# Patient Record
Sex: Male | Born: 1945
Health system: Southern US, Community
[De-identification: ages and names within clinical notes are randomized; demographics above are authoritative.]

## PROBLEM LIST (undated history)

## (undated) DIAGNOSIS — J449 Chronic obstructive pulmonary disease, unspecified: Secondary | ICD-10-CM

## (undated) DIAGNOSIS — I4891 Unspecified atrial fibrillation: Secondary | ICD-10-CM

## (undated) DIAGNOSIS — K922 Gastrointestinal hemorrhage, unspecified: Secondary | ICD-10-CM

## (undated) DIAGNOSIS — I700 Atherosclerosis of aorta: Secondary | ICD-10-CM

## (undated) DIAGNOSIS — Z720 Tobacco use: Secondary | ICD-10-CM

## (undated) DIAGNOSIS — I493 Ventricular premature depolarization: Secondary | ICD-10-CM

## (undated) DIAGNOSIS — J441 Chronic obstructive pulmonary disease with (acute) exacerbation: Secondary | ICD-10-CM

## (undated) DIAGNOSIS — K760 Fatty (change of) liver, not elsewhere classified: Secondary | ICD-10-CM

## (undated) DIAGNOSIS — I4729 Other ventricular tachycardia: Secondary | ICD-10-CM

## (undated) DIAGNOSIS — I5189 Other ill-defined heart diseases: Secondary | ICD-10-CM

## (undated) DIAGNOSIS — R001 Bradycardia, unspecified: Secondary | ICD-10-CM

## (undated) DIAGNOSIS — I452 Bifascicular block: Secondary | ICD-10-CM

## (undated) DIAGNOSIS — K5520 Angiodysplasia of colon without hemorrhage: Secondary | ICD-10-CM

## (undated) DIAGNOSIS — E669 Obesity, unspecified: Secondary | ICD-10-CM

## (undated) DIAGNOSIS — I10 Essential (primary) hypertension: Secondary | ICD-10-CM

## (undated) DIAGNOSIS — I447 Left bundle-branch block, unspecified: Secondary | ICD-10-CM

## (undated) DIAGNOSIS — I472 Ventricular tachycardia: Secondary | ICD-10-CM

## (undated) DIAGNOSIS — R9431 Abnormal electrocardiogram [ECG] [EKG]: Secondary | ICD-10-CM

## (undated) DIAGNOSIS — G4733 Obstructive sleep apnea (adult) (pediatric): Secondary | ICD-10-CM

## (undated) DIAGNOSIS — I779 Disorder of arteries and arterioles, unspecified: Secondary | ICD-10-CM

## (undated) DIAGNOSIS — R55 Syncope and collapse: Secondary | ICD-10-CM

## (undated) DIAGNOSIS — I350 Nonrheumatic aortic (valve) stenosis: Secondary | ICD-10-CM

## (undated) HISTORY — DX: Left bundle-branch block, unspecified: I44.7

## (undated) HISTORY — DX: Syncope and collapse: R55

## (undated) HISTORY — DX: Ventricular tachycardia: I47.2

## (undated) HISTORY — DX: Essential (primary) hypertension: I10

## (undated) HISTORY — DX: Abnormal electrocardiogram (ECG) (EKG): R94.31

---

## 2000-06-11 ENCOUNTER — Emergency Department (HOSPITAL_COMMUNITY): Admission: EM | Admit: 2000-06-11 | Discharge: 2000-06-11 | Payer: Self-pay

## 2005-12-18 ENCOUNTER — Ambulatory Visit (HOSPITAL_COMMUNITY): Admission: RE | Admit: 2005-12-18 | Discharge: 2005-12-18 | Payer: Self-pay | Admitting: *Deleted

## 2014-07-21 DIAGNOSIS — B9789 Other viral agents as the cause of diseases classified elsewhere: Secondary | ICD-10-CM | POA: Diagnosis not present

## 2014-07-21 DIAGNOSIS — J069 Acute upper respiratory infection, unspecified: Secondary | ICD-10-CM | POA: Diagnosis not present

## 2014-07-21 DIAGNOSIS — I10 Essential (primary) hypertension: Secondary | ICD-10-CM | POA: Diagnosis not present

## 2015-06-15 ENCOUNTER — Other Ambulatory Visit: Payer: Self-pay | Admitting: Internal Medicine

## 2015-06-15 DIAGNOSIS — J449 Chronic obstructive pulmonary disease, unspecified: Secondary | ICD-10-CM | POA: Diagnosis not present

## 2015-06-15 DIAGNOSIS — R9431 Abnormal electrocardiogram [ECG] [EKG]: Secondary | ICD-10-CM | POA: Diagnosis not present

## 2015-06-15 DIAGNOSIS — R55 Syncope and collapse: Secondary | ICD-10-CM

## 2015-06-15 DIAGNOSIS — J440 Chronic obstructive pulmonary disease with acute lower respiratory infection: Secondary | ICD-10-CM | POA: Diagnosis not present

## 2015-06-15 DIAGNOSIS — I951 Orthostatic hypotension: Secondary | ICD-10-CM | POA: Diagnosis not present

## 2015-06-16 ENCOUNTER — Telehealth: Payer: Self-pay | Admitting: Internal Medicine

## 2015-06-16 ENCOUNTER — Other Ambulatory Visit (HOSPITAL_COMMUNITY): Payer: Self-pay | Admitting: Internal Medicine

## 2015-06-16 ENCOUNTER — Ambulatory Visit (HOSPITAL_COMMUNITY)
Admission: RE | Admit: 2015-06-16 | Discharge: 2015-06-16 | Disposition: A | Discharge status: Home / Self Care | Payer: Medicare Other | Source: Ambulatory Visit | Attending: Obstetrics & Gynecology | Admitting: Obstetrics & Gynecology

## 2015-06-16 DIAGNOSIS — R55 Syncope and collapse: Secondary | ICD-10-CM

## 2015-06-16 DIAGNOSIS — I6523 Occlusion and stenosis of bilateral carotid arteries: Secondary | ICD-10-CM | POA: Diagnosis not present

## 2015-06-16 DIAGNOSIS — E782 Mixed hyperlipidemia: Secondary | ICD-10-CM | POA: Diagnosis not present

## 2015-06-16 NOTE — Progress Notes (Signed)
VASCULAR LAB PRELIMINARY  PRELIMINARY  PRELIMINARY  PRELIMINARY  Carotid duplex completed.    Preliminary report:  Right : 1% to 39% ICA stenosis. Vertebral artery flow is "to - fro " consistent with a more proximal stenosis. Left :  There is an area of elevated velocities in the mid ICA consistent with a 60% to 79% ICA stenosis lower end of scale. The artery is also tortuous at this sight. The remainder of the artery proximal and distal are consistent with a 1% to 39% ICA stenosis. Vertebral artery flow is anterade.  Farid Grigorian, RVS 06/16/2015, 2:23 PM

## 2015-06-16 NOTE — Telephone Encounter (Signed)
Received records from [facility] for appointment with Dr Debara Pickett on 06/18/15.  Records given to [facility] (medical records) for Dr Lysbeth Penner schedule on 06/18/15. lp

## 2015-06-18 ENCOUNTER — Encounter: Payer: Self-pay | Admitting: Internal Medicine

## 2015-06-18 ENCOUNTER — Ambulatory Visit (INDEPENDENT_AMBULATORY_CARE_PROVIDER_SITE_OTHER): Payer: Medicare Other | Admitting: Internal Medicine

## 2015-06-18 VITALS — BP 166/88 | HR 68 | Ht 66.0 in | Wt 211.3 lb

## 2015-06-18 DIAGNOSIS — I447 Left bundle-branch block, unspecified: Secondary | ICD-10-CM | POA: Diagnosis not present

## 2015-06-18 DIAGNOSIS — R55 Syncope and collapse: Secondary | ICD-10-CM | POA: Diagnosis not present

## 2015-06-18 DIAGNOSIS — R9431 Abnormal electrocardiogram [ECG] [EKG]: Secondary | ICD-10-CM | POA: Diagnosis not present

## 2015-06-18 HISTORY — DX: Abnormal electrocardiogram (ECG) (EKG): R94.31

## 2015-06-18 HISTORY — DX: Left bundle-branch block, unspecified: I44.7

## 2015-06-18 HISTORY — DX: Syncope and collapse: R55

## 2015-06-18 NOTE — Progress Notes (Signed)
OFFICE NOTE  Chief Complaint:  Syncope, abnormal EKG  Primary Care Physician: Horatio Pel, MD  HPI:  Derrick Alexander is a pleasant 70-year-old male with no significant past medical history. He only takes aspirin occasionally as needed. Unfortunately he's a 60 year smoker that currently smoking. Recently he's had 3 different episodes of what seemed to be brief syncope. All of the episodes were at rest and he was seated at the time. His wife said that he was talking with her and then briefly seem to lose consciousness but then was easily awakened and was not confused. He did not slump over lose control of bowel or bladder function. There was no seizure-like activity. He reported some mild prodrome to the symptoms. In fact he felt some tingling and numbness around his scalp that felt like he was wearing a tight baseball cap as well as some graying of his vision. He underwent carotid Dopplers 2 days ago and those results are pending. Of note his blood pressure was mildly elevated today but he has no history of hypertension in fact he was thought to be possibly hypotensive or orthostatic. He denies any chest pain or worsening shortness of breath. His EKG however shows left bundle branch pattern as well as ST and T wave abnormalities laterally with 1 mm of horizontal ST depression at rest concerning for ischemia.  PMHx:  No significant past medical history No past medical history on file.  No past surgical history on file.  FAMHx:  Family History  Problem Relation Age of Onset  . Heart disease Brother     SOCHx:   reports that he has been smoking.  He started smoking about 60 years ago. He has never used smokeless tobacco. His alcohol and drug histories are not on file.  ALLERGIES:  No Known Allergies  ROS: A comprehensive review of systems was negative except for: Cardiovascular: positive for syncope  HOME MEDS: Current Outpatient Prescriptions  Medication Sig Dispense Refill    . aspirin 325 MG tablet Take 325 mg by mouth as needed.     No current facility-administered medications for this visit.    LABS/IMAGING: No results found for this or any previous visit (from the past 48 hour(s)). No results found.  WEIGHTS: Wt Readings from Last 3 Encounters:  06/18/15 211 lb 5 oz (95.851 kg)    VITALS: BP 166/88 mmHg  Pulse 68  Ht 5\' 6"  (1.676 m)  Wt 211 lb 5 oz (95.851 kg)  BMI 34.12 kg/m2  EXAM: General appearance: alert and no distress Neck: no carotid bruit and no JVD Lungs: clear to auscultation bilaterally Heart: regular rate and rhythm, S1, S2 normal, no murmur, click, rub or gallop Abdomen: soft, non-tender; bowel sounds normal; no masses,  no organomegaly Extremities: extremities normal, atraumatic, no cyanosis or edema Pulses: 2+ and symmetric Skin: Skin color, texture, turgor normal. No rashes or lesions Neurologic: Grossly normal Psych: Pleasant  EKG: Normal sinus rhythm at 68, left bundle branch pattern, ST and T-wave abnormalities with 1 mm ST depression horizontally in the lateral leads  ASSESSMENT: 1. Syncope 2. Abnormal EKG with left bundle branch / LAFB pattern 3. Tobacco abuse  PLAN: 1.   [name] is had several episodes of brief loss of consciousness which was proceeded very briefly by visual changes and some tingling in his scalp. There is no post ictal or seizure activity. He awakened very quickly with stimulation. It does not sound that he was necessarily falling asleep and did  not lose control of his body. The episodes were very brief and short-lived but are new. He denies any other associated symptoms such as chest pain or shortness of breath however his EKG is abnormal showing some ischemic changes. The EKG would be very difficult to interpret due to the left bundle pattern and therefore recommend a Lexiscan nuclear stress test to evaluate for ischemia. Arrhythmia is another possibility and I recommend that we place a 30 day  monitor to see if we can pick up any abnormal heart rhythms it could be causing these episodes. These do not sound necessarily like seizure activity and with the short duration and quick waking. It seems that seizure would be unlikely. Of course there is mild prodrome and one would think about orthostasis however blood pressures have seemed to be fairly normal or high. I think given his long smoking history, age and history of heart problems in his brother, ischemia evaluation is warranted.  Plan to see him back to discuss those findings after the tests are completed. Again thanks for the kind referral as always.  Pixie Casino, MD, [facility] Attending Cardiologist Joy C Hilty 06/18/2015, 6:10 PM

## 2015-06-18 NOTE — Patient Instructions (Signed)
Your physician has recommended that you wear an event monitor. Event monitors are medical devices that record the heart's electrical activity. Doctors most often [place] these monitors to diagnose arrhythmias. Arrhythmias are problems with the speed or rhythm of the heartbeat. The monitor is a small, portable device. You can wear one while you do your normal daily activities. This is usually used to diagnose what is causing palpitations/syncope (passing out).  Your physician has requested that you have a lexiscan myoview. For further information please visit [email or web address]. Please follow instruction sheet, as given.  Dr Debara Pickett recommends that you schedule a follow-up appointment in 6 weeks.  Your Doctor has ordered you to wear a heart monitor. You will wear this for 30 days.   TIPS -  REMINDERS 1. The sensor is the lanyard that is worn around your neck every day - this is powered by a battery that needs to be changed every day 2. The monitor is the device that allows you to record symptoms - this will need to be charged daily 3. The sensor & monitor need to be within 100 feet of each other at all times 4. The sensor connects to the electrodes (stickers) - these should be changed every 24-48 hours (you do not have to remove them when you bathe, just make sure they are dry when you connect it back to the sensor 5. If you need more supplies (electrodes, batteries), please call the 1-800 # on the back of the pamphlet and [employer] will mail you more supplies 6. If your skin becomes sensitive, please try the sample pack of sensitive skin electrodes (the white packet in your silver box) and call [employer] to have them mail you more of these type of electrodes 7. When you are finish wearing the monitor, please place all supplies back in the silver box, place the silver box in the pre-packaged UPS bag and drop off at [employer] or call them so they can come pick it up   Cardiac Event Monitoring A cardiac event  monitor is a small recording device used to help detect abnormal heart rhythms (arrhythmias). The monitor is used to record heart rhythm when noticeable symptoms such as the following occur:  Fast heartbeats (palpitations), such as heart racing or fluttering.  Dizziness.  Fainting or light-headedness.  Unexplained weakness. The monitor is wired to two electrodes placed on your chest. Electrodes are flat, sticky disks that attach to your skin. The monitor can be worn for up to 30 days. You will wear the monitor at all times, except when bathing.  HOW TO USE YOUR CARDIAC EVENT MONITOR A technician will prepare your chest for the electrode placement. The technician will show you how to place the electrodes, how to work the monitor, and how to replace the batteries. Take time to practice using the monitor before you leave the office. Make sure you understand how to send the information from the monitor to your health care provider. This requires a telephone with a landline, not a cell phone. You need to:  Wear your monitor at all times, except when you are in water:  Do not get the monitor wet.  Take the monitor off when bathing. Do not swim or use a hot tub with it on.  Keep your skin clean. Do not put body lotion or moisturizer on your chest.  Change the electrodes daily or any time they stop sticking to your skin. You might need to use tape to keep them on.  It  is possible that your skin under the electrodes could become irritated. To keep this from happening, try to put the electrodes in slightly different places on your chest. However, they must remain in the area under your left breast and in the upper right section of your chest.  Make sure the monitor is safely clipped to your clothing or in a location close to your body that your health care provider recommends.  Press the button to record when you feel symptoms of heart trouble, such as dizziness, weakness, light-headedness,  palpitations, thumping, shortness of breath, unexplained weakness, or a fluttering or racing heart. The monitor is always on and records what happened slightly before you pressed the button, so do not worry about being too late to get good information.  Keep a diary of your activities, such as walking, doing chores, and taking medicine. It is especially important to note what you were doing when you pushed the button to record your symptoms. This will help your health care provider determine what might be contributing to your symptoms. The information stored in your monitor will be reviewed by your health care provider alongside your diary entries.  Send the recorded information as recommended by your health care provider. It is important to understand that it will take some time for your health care provider to process the results.  Change the batteries as recommended by your health care provider. SEEK IMMEDIATE MEDICAL CARE IF:   You have chest pain.  You have extreme difficulty breathing or shortness of breath.  You develop a very fast heartbeat that persists.  You develop dizziness that does not go away.  You faint or constantly feel you are about to faint. Document Released: 02/15/2008 Document Revised: 09/22/2013 Document Reviewed: 11/04/2012 [facility] Patient Information 2015 [place], [place]. This information is not intended to replace advice given to you by your health care provider. Make sure you discuss any questions you have with your health care provider.

## 2015-06-25 ENCOUNTER — Telehealth: Payer: Self-pay | Admitting: Internal Medicine

## 2015-06-25 ENCOUNTER — Ambulatory Visit (HOSPITAL_COMMUNITY)
Admission: RE | Admit: 2015-06-25 | Discharge: 2015-06-25 | Disposition: A | Discharge status: Home / Self Care | Payer: Medicare Other | Source: Ambulatory Visit | Attending: Cardiovascular Disease | Admitting: Cardiovascular Disease

## 2015-06-25 DIAGNOSIS — I779 Disorder of arteries and arterioles, unspecified: Secondary | ICD-10-CM | POA: Insufficient documentation

## 2015-06-25 DIAGNOSIS — E663 Overweight: Secondary | ICD-10-CM | POA: Diagnosis not present

## 2015-06-25 DIAGNOSIS — Z6834 Body mass index (BMI) 34.0-34.9, adult: Secondary | ICD-10-CM | POA: Insufficient documentation

## 2015-06-25 DIAGNOSIS — R9431 Abnormal electrocardiogram [ECG] [EKG]: Secondary | ICD-10-CM | POA: Diagnosis not present

## 2015-06-25 DIAGNOSIS — R55 Syncope and collapse: Secondary | ICD-10-CM | POA: Diagnosis not present

## 2015-06-25 DIAGNOSIS — Z8249 Family history of ischemic heart disease and other diseases of the circulatory system: Secondary | ICD-10-CM | POA: Diagnosis not present

## 2015-06-25 DIAGNOSIS — I447 Left bundle-branch block, unspecified: Secondary | ICD-10-CM | POA: Diagnosis not present

## 2015-06-25 DIAGNOSIS — Z8673 Personal history of transient ischemic attack (TIA), and cerebral infarction without residual deficits: Secondary | ICD-10-CM | POA: Diagnosis not present

## 2015-06-25 DIAGNOSIS — R0609 Other forms of dyspnea: Secondary | ICD-10-CM | POA: Diagnosis not present

## 2015-06-25 LAB — MYOCARDIAL PERFUSION IMAGING
LV dias vol: 98 mL
LV sys vol: 42 mL
Peak HR: 73 {beats}/min
Rest HR: 56 {beats}/min
SDS: 0
SRS: 3
SSS: 1
TID: 1.22

## 2015-06-25 MED ORDER — REGADENOSON 0.4 MG/5ML IV SOLN
0.4000 mg | Freq: Once | INTRAVENOUS | Status: AC
  Administered 2015-06-25: 0.4 mg via INTRAVENOUS

## 2015-06-25 MED ORDER — TECHNETIUM TC 99M SESTAMIBI GENERIC - CARDIOLITE
31.2000 | Freq: Once | INTRAVENOUS | Status: AC | PRN
  Administered 2015-06-25: 31.2 via INTRAVENOUS

## 2015-06-25 MED ORDER — TECHNETIUM TC 99M SESTAMIBI GENERIC - CARDIOLITE
10.9000 | Freq: Once | INTRAVENOUS | Status: AC | PRN
  Administered 2015-06-25: 10.9 via INTRAVENOUS

## 2015-06-25 NOTE — Telephone Encounter (Signed)
The patient would like a call with the results of his carotid study and also his stress test.  Please call wife at [phone] as patient is hard to get in touch with.

## 2015-06-29 DIAGNOSIS — E782 Mixed hyperlipidemia: Secondary | ICD-10-CM | POA: Diagnosis not present

## 2015-06-29 DIAGNOSIS — J449 Chronic obstructive pulmonary disease, unspecified: Secondary | ICD-10-CM | POA: Diagnosis not present

## 2015-06-29 DIAGNOSIS — I6529 Occlusion and stenosis of unspecified carotid artery: Secondary | ICD-10-CM | POA: Diagnosis not present

## 2015-06-30 ENCOUNTER — Telehealth: Payer: Self-pay | Admitting: Internal Medicine

## 2015-06-30 NOTE — Telephone Encounter (Signed)
Results given.

## 2015-06-30 NOTE — Telephone Encounter (Signed)
Pt returned call

## 2015-06-30 NOTE — Telephone Encounter (Signed)
Left message to call.

## 2015-06-30 NOTE — Telephone Encounter (Signed)
New message ° ° ° ° °Returning a call to the nurse °

## 2015-07-07 ENCOUNTER — Telehealth: Payer: Self-pay | Admitting: Internal Medicine

## 2015-07-07 NOTE — Telephone Encounter (Signed)
Derrick Alexander is returning a call about test results. Please Call

## 2015-07-07 NOTE — Telephone Encounter (Signed)
Returned Dub Mikes' call and gave results of stress test. She voiced understanding and thanks.  Pt has pending carotid US ordered by Dr. Shelia Media. He also has a monitor he is wearing and a pending f/u.  Wants to wait for carotid study until after results of monitor.

## 2015-08-04 ENCOUNTER — Ambulatory Visit (INDEPENDENT_AMBULATORY_CARE_PROVIDER_SITE_OTHER): Payer: Medicare Other | Admitting: Internal Medicine

## 2015-08-04 ENCOUNTER — Encounter: Payer: Self-pay | Admitting: Internal Medicine

## 2015-08-04 VITALS — BP 156/90 | HR 76 | Ht 66.0 in | Wt 218.0 lb

## 2015-08-04 DIAGNOSIS — I472 Ventricular tachycardia, unspecified: Secondary | ICD-10-CM

## 2015-08-04 DIAGNOSIS — R001 Bradycardia, unspecified: Secondary | ICD-10-CM | POA: Diagnosis not present

## 2015-08-04 DIAGNOSIS — I447 Left bundle-branch block, unspecified: Secondary | ICD-10-CM

## 2015-08-04 DIAGNOSIS — R55 Syncope and collapse: Secondary | ICD-10-CM | POA: Diagnosis not present

## 2015-08-04 DIAGNOSIS — I493 Ventricular premature depolarization: Secondary | ICD-10-CM

## 2015-08-04 DIAGNOSIS — R9431 Abnormal electrocardiogram [ECG] [EKG]: Secondary | ICD-10-CM

## 2015-08-04 NOTE — Patient Instructions (Signed)
You have been referred to Dr. Allegra Lai (urgent referral - this week preferable)  Your physician recommends that you schedule a follow-up appointment in: 3 months with Dr. Debara Pickett

## 2015-08-04 NOTE — Progress Notes (Signed)
OFFICE NOTE  Chief Complaint:  Follow-up monitor  Primary Care Physician: Horatio Pel, MD  HPI:  Derrick Alexander is a pleasant 70-year-old male with no significant past medical history. He only takes aspirin occasionally as needed. Unfortunately he's a 60 year smoker that currently smoking. Recently he's had 3 different episodes of what seemed to be brief syncope. All of the episodes were at rest and he was seated at the time. His wife said that he was talking with her and then briefly seem to lose consciousness but then was easily awakened and was not confused. He did not slump over lose control of bowel or bladder function. There was no seizure-like activity. He reported some mild prodrome to the symptoms. In fact he felt some tingling and numbness around his scalp that felt like he was wearing a tight baseball cap as well as some graying of his vision. He underwent carotid Dopplers 2 days ago and those results are pending. Of note his blood pressure was mildly elevated today but he has no history of hypertension in fact he was thought to be possibly hypotensive or orthostatic. He denies any chest pain or worsening shortness of breath. His EKG however shows left bundle branch pattern as well as ST and T wave abnormalities laterally with 1 mm of horizontal ST depression at rest concerning for ischemia.  [name] returns to follow-up on his monitor. Monitor shows NSVT up 16 beat runs and periods of ventricular escape rhythm in the 30's. Either could explain syncope. His nuclear stress test was negative for ischemia with normal LV function. Suspect he will likely need AICD/PPM. I contacted [name] to recommend an urgent referral but he wished to discuss it further with me in the office first. He understands that these arrhythmias could be life-threatening.  PMHx:  No significant past medical history History reviewed. No pertinent past medical history.  History reviewed. No pertinent  past surgical history.  FAMHx:  Family History  Problem Relation Age of Onset  . Heart disease Brother     SOCHx:   reports that he has been smoking.  He started smoking about 60 years ago. He has never used smokeless tobacco. His alcohol and drug histories are not on file.  ALLERGIES:  No Known Allergies  ROS: A comprehensive review of systems was negative except for: Cardiovascular: positive for syncope  HOME MEDS: Current Outpatient Prescriptions  Medication Sig Dispense Refill  . aspirin 325 MG tablet Take 325 mg by mouth as needed.     No current facility-administered medications for this visit.    LABS/IMAGING: No results found for this or any previous visit (from the past 48 hour(s)). No results found.  WEIGHTS: Wt Readings from Last 3 Encounters:  08/04/15 218 lb (98.884 kg)  06/25/15 211 lb (95.709 kg)  06/18/15 211 lb 5 oz (95.851 kg)    VITALS: BP 156/90 mmHg  Pulse 76  Ht 5\' 6"  (1.676 m)  Wt 218 lb (98.884 kg)  BMI 35.20 kg/m2  EXAM: Deferred  EKG: Deferred  ASSESSMENT: 1. NSVT 2. Bradycardia - ventricular escape in the 30's 3. Syncope x 3 4. Abnormal EKG with left bundle branch / LAFB pattern 5. Tobacco abuse 6. Snoring, witnessed apnea  PLAN: 1.   [name] is had several episodes of brief loss of consciousness which was proceeded very briefly by visual changes and some tingling in his scalp. There is no post ictal or seizure activity. He awakened very quickly with stimulation.  It does not sound that he was necessarily falling asleep and did not lose control of his body. The episodes were very brief and short-lived but are new. He denies any other associated symptoms such as chest pain or shortness of breath however his EKG is abnormal showing some ischemic changes. The EKG would be very difficult to interpret due to the left bundle pattern. He did undergo Lexiscan nuclear stress testing which was negative for ischemia and showed normal LV  function. I'm concerned about the findings of his monitor which showed nonsustained VT as well as bradycardia with ventricular escape and heart rate in the 30s. He is not on any AV nodal blockers. This could suggest he may need a pacemaker or AICD. His wife did note that he has some snoring and witnessed episodes of apnea and a sleep study would be appropriate. He seems to be willing to do that. I will also refer him to cardiac electrophysiology to see if he is a candidate for device therapy or perhaps provocative testing such as of VT inducibility study.  Follow-up with me in 3 months.  Pixie Casino, MD, [facility] Attending Cardiologist Ellerslie C Makesha Belitz 08/04/2015, 5:35 PM

## 2015-08-06 DIAGNOSIS — I4729 Other ventricular tachycardia: Secondary | ICD-10-CM | POA: Insufficient documentation

## 2015-08-06 DIAGNOSIS — I472 Ventricular tachycardia, unspecified: Secondary | ICD-10-CM

## 2015-08-06 DIAGNOSIS — R55 Syncope and collapse: Secondary | ICD-10-CM

## 2015-08-06 HISTORY — DX: Ventricular tachycardia, unspecified: I47.20

## 2015-08-06 HISTORY — DX: Ventricular tachycardia: I47.2

## 2015-08-06 HISTORY — DX: Syncope and collapse: R55

## 2015-08-08 NOTE — Progress Notes (Signed)
Electrophysiology Office Note   Date:  08/09/2015   ID:  Derrick Alexander, DOB 03/14/1946, MRN [id number]  PCP:  Horatio Pel, MD  Cardiologist:  Debara Pickett Primary Electrophysiologist:  Susanna Benge [name], MD    Chief Complaint  Patient presents with  . New Patient (Initial Visit)  . V-tach  . Atrial Fibrillation  . Loss of Consciousness     History of Present Illness: Derrick Alexander is a 70 y.o. male who presents today for electrophysiology evaluation.     He has no significant past medical history. He only takes aspirin occasionally as needed. He is a 48 year smoker that currently smoking. Recently he's had 3 different episodes of what seemed to be brief syncope. All of the episodes were at rest and he was seated at the time. His wife said that he was talking with her and then briefly seem to lose consciousness but then was easily awakened and was not confused. He did not slump over lose control of bowel or bladder function. There was no seizure-like activity. He reported some mild prodrome to the symptoms. He felt some tingling and numbness around his scalp that felt like he was wearing a tight baseball cap as well as some graying of his vision.   Monitor shows NSVT up 16 beat runs and periods of ventricular escape rhythm in the 30's.  His nuclear stress test was negative for ischemia with normal LV function. He has had no further syncope since his most recent episode.  He says that he was unaware of the NSVT.  He was sleeping when his HR got low with a ventricular escape.  Today, he denies symptoms of palpitations, chest pain, shortness of breath, orthopnea, PND, lower extremity edema, claudication, dizziness, presyncope, syncope, bleeding, or neurologic sequela. The patient is tolerating medications without difficulties and is otherwise without complaint today.    Past Medical History  Diagnosis Date  . LBBB (left bundle branch block) 06/18/2015  . Faintness 06/18/2015  .  Abnormal EKG 06/18/2015  . V-tach ([name]) 08/06/2015  . Syncope 08/06/2015  . Hypertension    History reviewed. No pertinent past surgical history.   Current Outpatient Prescriptions  Medication Sig Dispense Refill  . aspirin 325 MG tablet Take 325 mg by mouth as needed.     No current facility-administered medications for this visit.    Allergies:   Review of patient's allergies indicates no known allergies.   Social History:  The patient  reports that he has been smoking.  He started smoking about 60 years ago. He has never used smokeless tobacco.   Family History:  The patient's family history includes COPD in his brother; Diabetes in his mother; Heart disease in his brother.    ROS:  Please see the history of present illness.   Otherwise, review of systems is positive for palpitations, DOE, wheezing, snoring, dizziness, syncope.   All other systems are reviewed and negative.    PHYSICAL EXAM: VS:  BP 158/90 mmHg  Pulse 106  Ht 5' 5.5" (1.664 m)  Wt 215 lb 3.2 oz (97.614 kg)  BMI 35.25 kg/m2 , BMI Body mass index is 35.25 kg/(m^2). GEN: Well nourished, well developed, in no acute distress HEENT: normal Neck: no JVD, carotid bruits, or masses Cardiac: RRR; no murmurs, rubs, or gallops,no edema  Respiratory:  clear to auscultation bilaterally, normal work of breathing GI: soft, nontender, nondistended, + BS MS: no deformity or atrophy Skin: warm and dry Neuro:  Strength and sensation are intact  Psych: euthymic mood, full affect  EKG:  EKG is ordered today. The ekg ordered today shows atrial fibrillation, inferior Q waves, anterior Q waves  Recent Labs: No results found for requested labs within last 365 days.    Lipid Panel  No results found for: CHOL, TRIG, HDL, CHOLHDL, VLDL, LDLCALC, LDLDIRECT   Wt Readings from Last 3 Encounters:  08/09/15 215 lb 3.2 oz (97.614 kg)  08/04/15 218 lb (98.884 kg)  06/25/15 211 lb (95.709 kg)      Other studies  Reviewed: Additional studies/ records that were reviewed today include: Spect 06/25/15  Review of the above records today demonstrates:   The left ventricular ejection fraction is normal (55-65%).  Nuclear stress EF: 57%.  There was no ST segment deviation noted during stress.  The study is normal.  Normal stress nuclear study with no ischemia or infarction; EF 57 with normal wall motion.  Tele monitor 06/17/14 Monitor shows NSVT up 16 beat runs and periods of ventricular escape rhythm in the 30's.   ASSESSMENT AND PLAN:  1.  NSVT: Short run of NSVT.  Arjay Jaskiewicz get TTE to determine his LVEF and any other cause for VT.  May need CMRI in the future.  2. Bradycardia: was at night when sleeping.  No current symptoms and did not have syncope while wearing the monitor.  Shivaun Bilello continue to monitor, may need pacing in the future.  3. Syncope: no symptoms since most recent event and no evidence of bradycardia while awake on monitor  4. Atrial fibrillation: Patient unaware of how long he has been in AF.  Alana Dayton get TTE to determine his LA size.  He has a CHADS2VASc of 2 and Khai Torbert therefore start him on Xarelto.  He would need to be anticoagulated for 3 weeks prior to cardioversion.    5. Hypertension: started lisinopril, Jilliam Bellmore avoid beta blockers as has had significant bradycardia.  Current medicines are reviewed at length with the patient today.   The patient does not have concerns regarding his medicines.  The following changes were made today:  Xarelto, lisinopril  Labs/ tests ordered today include:  No orders of the defined types were placed in this encounter.     Disposition:   FU with Jakavion Bilodeau 1 month  Signed, Rilya Longo [name], MD  08/09/2015 11:55 AM     [facility] [address] [place] [address] [phone] (office) [phone] (fax)

## 2015-08-09 ENCOUNTER — Encounter: Payer: Self-pay | Admitting: Cardiology

## 2015-08-09 ENCOUNTER — Ambulatory Visit (INDEPENDENT_AMBULATORY_CARE_PROVIDER_SITE_OTHER): Payer: Medicare Other | Admitting: Cardiology

## 2015-08-09 VITALS — BP 158/90 | HR 106 | Ht 65.5 in | Wt 215.2 lb

## 2015-08-09 DIAGNOSIS — I472 Ventricular tachycardia: Secondary | ICD-10-CM | POA: Diagnosis not present

## 2015-08-09 DIAGNOSIS — I4891 Unspecified atrial fibrillation: Secondary | ICD-10-CM

## 2015-08-09 DIAGNOSIS — I4729 Other ventricular tachycardia: Secondary | ICD-10-CM

## 2015-08-09 MED ORDER — RIVAROXABAN 20 MG PO TABS: 20.0000 mg | ORAL_TABLET | Freq: Every day | ORAL | Status: DC

## 2015-08-09 NOTE — Patient Instructions (Signed)
Medication Instructions:  Your physician has recommended you make the following change in your medication:  1) STOP Aspirin 2) START Xarelto 20 mg daily  Labwork: None ordered  Testing/Procedures: Your physician has requested that you have an echocardiogram. Echocardiography is a painless test that uses sound waves to create images of your heart. It provides your doctor with information about the size and shape of your heart and how well your heart's chambers and valves are working. This procedure takes approximately one hour. There are no restrictions for this procedure.  Follow-Up: Your physician recommends that you schedule a follow-up appointment in: 1 month with Dr. Curt Bears.  If you need a refill on your cardiac medications before your next appointment, please call your pharmacy.  Thank you for choosing [facility]!!   Trinidad Curet, RN [phone]

## 2015-08-09 NOTE — Progress Notes (Signed)
Determine Xarelto dosing: [facility] w/ Dr. Pennie Banter office on 06/16/15 - Creatinie 1.0 (they will fax results to office)

## 2015-08-10 DIAGNOSIS — I4891 Unspecified atrial fibrillation: Secondary | ICD-10-CM | POA: Insufficient documentation

## 2015-08-11 ENCOUNTER — Telehealth: Payer: Self-pay | Admitting: Cardiology

## 2015-08-11 NOTE — Telephone Encounter (Signed)
Spoke with patient's wife (ok per DPR on file). She reports patient sweating profusely last night and he reports feeling very tired this morning. No other symptoms reported. Wife does not have a way to check heart rate. Informed that reported symptoms are not related to Xarelto (started 3/20).  Seen on 3/20 for EP consult. Monitor in January showed NSVT, 16 bt runs -- pt does not feel abnormal heart rhythms.  Nuclear stress test in February was negative. Echo scheduled for 4/3. F/u w/ Camnitz scheduled for 3/20.  Will forward to Dr. Curt Bears for advisement. Wife is aware I will call her with his recommendation/s.

## 2015-08-11 NOTE — Telephone Encounter (Signed)
New message  Pt c/o medication issue:  1. Name of Medication: Xarelto  4. What is your medication issue? Since the pt has taken the medication he has broken out into terrible sweats and he is very tired. She is requesting a call back to determine if the dosage will need to be changed. Please call

## 2015-08-12 MED ORDER — LISINOPRIL 20 MG PO TABS: 20.0000 mg | ORAL_TABLET | Freq: Every day | ORAL | Status: DC

## 2015-08-12 NOTE — Addendum Note (Signed)
Addended by: Stanton Kidney on: 08/12/2015 05:02 PM   Modules accepted: Orders

## 2015-08-12 NOTE — Telephone Encounter (Addendum)
Per Camnitz: Move echo sooner. Rescheduled to 3/28.  Pt's wife tells me that he has not experienced another episode. She will keep [place] informed. Also informed her that he wanted to start Lisinopril for BP.  (he advised on this after patient left appt on Monday) Will send rx to [facility]

## 2015-08-13 NOTE — Addendum Note (Signed)
Addended by: Freada Bergeron on: 08/13/2015 08:55 AM   Modules accepted: Orders

## 2015-08-17 ENCOUNTER — Other Ambulatory Visit: Payer: Self-pay

## 2015-08-17 ENCOUNTER — Ambulatory Visit (HOSPITAL_COMMUNITY): Payer: Medicare Other | Attending: Cardiology

## 2015-08-17 DIAGNOSIS — I4891 Unspecified atrial fibrillation: Secondary | ICD-10-CM | POA: Insufficient documentation

## 2015-08-17 DIAGNOSIS — I059 Rheumatic mitral valve disease, unspecified: Secondary | ICD-10-CM | POA: Diagnosis not present

## 2015-08-17 DIAGNOSIS — I4729 Other ventricular tachycardia: Secondary | ICD-10-CM

## 2015-08-17 DIAGNOSIS — Z8249 Family history of ischemic heart disease and other diseases of the circulatory system: Secondary | ICD-10-CM | POA: Insufficient documentation

## 2015-08-17 DIAGNOSIS — I472 Ventricular tachycardia: Secondary | ICD-10-CM | POA: Diagnosis not present

## 2015-08-17 DIAGNOSIS — I447 Left bundle-branch block, unspecified: Secondary | ICD-10-CM | POA: Diagnosis not present

## 2015-08-17 DIAGNOSIS — Z720 Tobacco use: Secondary | ICD-10-CM | POA: Diagnosis not present

## 2015-08-17 DIAGNOSIS — I351 Nonrheumatic aortic (valve) insufficiency: Secondary | ICD-10-CM | POA: Diagnosis not present

## 2015-08-17 DIAGNOSIS — I119 Hypertensive heart disease without heart failure: Secondary | ICD-10-CM | POA: Diagnosis not present

## 2015-08-17 DIAGNOSIS — I77810 Thoracic aortic ectasia: Secondary | ICD-10-CM | POA: Insufficient documentation

## 2015-08-23 ENCOUNTER — Other Ambulatory Visit (HOSPITAL_COMMUNITY): Payer: Medicare Other

## 2015-09-09 ENCOUNTER — Encounter: Payer: Self-pay | Admitting: Cardiology

## 2015-09-09 ENCOUNTER — Ambulatory Visit (INDEPENDENT_AMBULATORY_CARE_PROVIDER_SITE_OTHER): Payer: Medicare Other | Admitting: Cardiology

## 2015-09-09 ENCOUNTER — Encounter: Payer: Medicare Other | Admitting: Cardiology

## 2015-09-09 VITALS — BP 148/96 | HR 72 | Ht 66.0 in | Wt 215.6 lb

## 2015-09-09 DIAGNOSIS — I480 Paroxysmal atrial fibrillation: Secondary | ICD-10-CM

## 2015-09-09 MED ORDER — LISINOPRIL 40 MG PO TABS: 40.0000 mg | ORAL_TABLET | Freq: Every day | ORAL | Status: DC

## 2015-09-09 NOTE — Progress Notes (Signed)
Electrophysiology Office Note   Date:  09/09/2015   ID:  Derrick Alexander, DOB [birth year], MRN [id number]  PCP:  Horatio Pel, MD  Cardiologist:  Debara Pickett Primary Electrophysiologist:  Adaleigh Warf [name], MD    Chief Complaint  Patient presents with  . Follow-up    1 month f/u  . Atrial Fibrillation     History of Present Illness: Derrick Alexander is a 70 y.o. male who presents today for electrophysiology evaluation.     He has no significant past medical history. He only takes aspirin occasionally as needed. He is a 30 year smoker that currently smoking. Recently he's had 3 different episodes of what seemed to be brief syncope. All of the episodes were at rest and he was seated at the time. His wife said that he was talking with her and then briefly seem to lose consciousness but then was easily awakened and was not confused. He did not slump over lose control of bowel or bladder function. There was no seizure-like activity. He reported some mild prodrome to the symptoms. He felt some tingling and numbness around his scalp that felt like he was wearing a tight baseball cap as well as some graying of his vision.   Monitor shows NSVT up 16 beat runs and periods of ventricular escape rhythm in the 30's.  His nuclear stress test was negative for ischemia with normal LV function. He has had no further syncope since his most recent episode.  He says that he was unaware of the NSVT.  He was sleeping when his HR got low with a ventricular escape.  Today, he denies symptoms of palpitations, chest pain, shortness of breath, orthopnea, PND, lower extremity edema, claudication, dizziness, presyncope, syncope, bleeding, or neurologic sequela. The patient is tolerating medications without difficulties and is otherwise without complaint today. Since he was last seen, he is felt well without complaint. He does not know when he is in atrial fibrillation, and has no fatigue, weakness, shortness of breath,  or palpitations. He is tolerating the apixaban without issue. He does have a home blood pressure cuff and blood pressures are running in the high 140s. He says he has not passed out since last being seen.   Past Medical History  Diagnosis Date  . LBBB (left bundle branch block) 06/18/2015  . Faintness 06/18/2015  . Abnormal EKG 06/18/2015  . V-tach ([place]) 08/06/2015  . Syncope 08/06/2015  . Hypertension    No past surgical history on file.   Current Outpatient Prescriptions  Medication Sig Dispense Refill  . lisinopril (PRINIVIL,ZESTRIL) 20 MG tablet Take 1 tablet (20 mg total) by mouth daily. 30 tablet 3  . rivaroxaban (XARELTO) 20 MG TABS tablet Take 1 tablet (20 mg total) by mouth daily with supper. 30 tablet 3   No current facility-administered medications for this visit.    Allergies:   Review of patient's allergies indicates no known allergies.   Social History:  The patient  reports that he has been smoking.  He started smoking about 60 years ago. He has never used smokeless tobacco.   Family History:  The patient's family history includes COPD in his brother; Diabetes in his mother; Heart disease in his brother.    ROS:  Please see the history of present illness.   Otherwise, review of systems is negative.   All other systems are reviewed and negative.    PHYSICAL EXAM: VS:  BP 148/96 mmHg  Pulse 72  Ht 5\' 6"  (1.676 m)  Wt 215 lb 9.6 oz (97.796 kg)  BMI 34.82 kg/m2 , BMI Body mass index is 34.82 kg/(m^2). GEN: Well nourished, well developed, in no acute distress HEENT: normal Neck: no JVD, carotid bruits, or masses Cardiac: RRR; no murmurs, rubs, or gallops,no edema  Respiratory:  clear to auscultation bilaterally, normal work of breathing GI: soft, nontender, nondistended, + BS MS: no deformity or atrophy Skin: warm and dry Neuro:  Strength and sensation are intact Psych: euthymic mood, full affect  EKG:  EKG is not ordered today.  Recent Labs: No results found  for requested labs within last 365 days.    Lipid Panel  No results found for: CHOL, TRIG, HDL, CHOLHDL, VLDL, LDLCALC, LDLDIRECT   Wt Readings from Last 3 Encounters:  09/09/15 215 lb 9.6 oz (97.796 kg)  08/09/15 215 lb 3.2 oz (97.614 kg)  08/04/15 218 lb (98.884 kg)      Other studies Reviewed: Additional studies/ records that were reviewed today include: Spect 06/25/15  Review of the above records today demonstrates:   The left ventricular ejection fraction is normal (55-65%).  Nuclear stress EF: 57%.  There was no ST segment deviation noted during stress.  The study is normal.  Normal stress nuclear study with no ischemia or infarction; EF 57 with normal wall motion.  Tele monitor 06/17/14 Monitor shows NSVT up 16 beat runs and periods of ventricular escape rhythm in the 30's.   TTE 08/17/15 - The patient was in atrial fibrillation. Normal LV size with mild  LV hypertrophy. EF 65-70%. Normal RV size and systolic function.  No significant valvular abnormalities.  ASSESSMENT AND PLAN:  1.  NSVT: Short run of NSVT.  Alzada Brazee get TTE to determine his LVEF and any other cause for VT.  May need CMRI in the future.  Has had no further symptoms with a normal EF.  2. Bradycardia: was at night when sleeping.  No current symptoms and did not have syncope while wearing the monitor.  No further syncope or evidence of bradycardia.  3. Syncope: no symptoms since most recent event and no evidence of bradycardia while awake on monitor.  We'll continue to monitor for further signs and symptoms of syncope. He has had none since last being seen. I have told him that he should not drive for 6 months after the most recent episode of syncope per [facility]. Driving can resume at the end of June.  4. Atrial fibrillation: Currently on apixaban and tolerating it well. Currently asymptomatic.   5. Hypertension: started lisinopril, Eliberto Sole avoid beta blockers as has had significant  bradycardia. Zackry Deines increase lisinopril dose to 40 mg.  Current medicines are reviewed at length with the patient today.   The patient does not have concerns regarding his medicines.  The following changes were made today:  Increase lisinopril to 40 mg   Labs/ tests ordered today include:  No orders of the defined types were placed in this encounter.     Disposition:   FU with Lakeia Bradshaw 3  months  Signed, Ifrah Vest [name], MD  09/09/2015 3:03 PM     [address] [place] [address] [phone] (office) [phone] (fax)

## 2015-09-09 NOTE — Progress Notes (Deleted)
Electrophysiology Office Note   Date:  09/09/2015   ID:  Derrick Alexander, DOB December 22, 1945, MRN [id number]  PCP:  Horatio Pel, MD  Cardiologist:  Debara Pickett Primary Electrophysiologist:  Travante Knee [name], MD    No chief complaint on file.    History of Present Illness: Derrick Alexander is a 70 y.o. male who presents today for electrophysiology evaluation.     He has no significant past medical history. He only takes aspirin occasionally as needed. He is a 66 year smoker that currently smoking. Recently he's had 3 different episodes of what seemed to be brief syncope. All of the episodes were at rest and he was seated at the time. His wife said that he was talking with her and then briefly seem to lose consciousness but then was easily awakened and was not confused. He did not slump over lose control of bowel or bladder function. There was no seizure-like activity. He reported some mild prodrome to the symptoms. He felt some tingling and numbness around his scalp that felt like he was wearing a tight baseball cap as well as some graying of his vision.   Monitor shows NSVT up 16 beat runs and periods of ventricular escape rhythm in the 30's.  His nuclear stress test was negative for ischemia with normal LV function. He has had no further syncope since his most recent episode.  He says that he was unaware of the NSVT.  He was sleeping when his HR got low with a ventricular escape.  Today, he denies symptoms of palpitations, chest pain, shortness of breath, orthopnea, PND, lower extremity edema, claudication, dizziness, presyncope, syncope, bleeding, or neurologic sequela. The patient is tolerating medications without difficulties and is otherwise without complaint today.    Past Medical History  Diagnosis Date  . LBBB (left bundle branch block) 06/18/2015  . Faintness 06/18/2015  . Abnormal EKG 06/18/2015  . V-tach ([place]) 08/06/2015  . Syncope 08/06/2015  . Hypertension    No past surgical  history on file.   Current Outpatient Prescriptions  Medication Sig Dispense Refill  . lisinopril (PRINIVIL,ZESTRIL) 20 MG tablet Take 1 tablet (20 mg total) by mouth daily. 30 tablet 3  . rivaroxaban (XARELTO) 20 MG TABS tablet Take 1 tablet (20 mg total) by mouth daily with supper. 30 tablet 0  . rivaroxaban (XARELTO) 20 MG TABS tablet Take 1 tablet (20 mg total) by mouth daily with supper. 30 tablet 3   No current facility-administered medications for this visit.    Allergies:   Review of patient's allergies indicates no known allergies.   Social History:  The patient  reports that he has been smoking.  He started smoking about 60 years ago. He has never used smokeless tobacco.   Family History:  The patient's family history includes COPD in his brother; Diabetes in his mother; Heart disease in his brother.    ROS:  Please see the history of present illness.   Otherwise, review of systems is positive for***.   All other systems are reviewed and negative.    PHYSICAL EXAM: VS:  There were no vitals taken for this visit. , BMI There is no weight on file to calculate BMI. GEN: Well nourished, well developed, in no acute distress HEENT: normal Neck: no JVD, carotid bruits, or masses Cardiac: ***RRR; no murmurs, rubs, or gallops,no edema  Respiratory:  clear to auscultation bilaterally, normal work of breathing GI: soft, nontender, nondistended, + BS MS: no deformity or atrophy Skin: warm and  dry Neuro:  Strength and sensation are intact Psych: euthymic mood, full affect  EKG:  EKG is ordered today. The ekg ordered today shows ***  Recent Labs: No results found for requested labs within last 365 days.    Lipid Panel  No results found for: CHOL, TRIG, HDL, CHOLHDL, VLDL, LDLCALC, LDLDIRECT   Wt Readings from Last 3 Encounters:  08/09/15 215 lb 3.2 oz (97.614 kg)  08/04/15 218 lb (98.884 kg)  06/25/15 211 lb (95.709 kg)      Other studies Reviewed: Additional  studies/ records that were reviewed today include: Spect 06/25/15  Review of the above records today demonstrates:   The left ventricular ejection fraction is normal (55-65%).  Nuclear stress EF: 57%.  There was no ST segment deviation noted during stress.  The study is normal.  Normal stress nuclear study with no ischemia or infarction; EF 57 with normal wall motion.  Tele monitor 06/17/14 Monitor shows NSVT up 16 beat runs and periods of ventricular escape rhythm in the 30's.   ASSESSMENT AND PLAN:  1.  NSVT: Short run of NSVT.  Suzannah Bettes get TTE to determine his LVEF and any other cause for VT.  May need CMRI in the future.  2. Bradycardia: was at night when sleeping.  No current symptoms and did not have syncope while wearing the monitor.  Araeya Lamb continue to monitor, may need pacing in the future.  3. Syncope: no symptoms since most recent event and no evidence of bradycardia while awake on monitor  4. Atrial fibrillation: Patient unaware of how long he has been in AF.  Noa Galvao get TTE to determine his LA size.  He has a CHADS2VASc of 2 and Maximos Zayas therefore start him on Xarelto.  He would need to be anticoagulated for 3 weeks prior to cardioversion.    5. Hypertension: started lisinopril, Shavaun Osterloh avoid beta blockers as has had significant bradycardia.  Current medicines are reviewed at length with the patient today.   The patient does not have concerns regarding his medicines.  The following changes were made today:  Xarelto, lisinopril  Labs/ tests ordered today include:  No orders of the defined types were placed in this encounter.     Disposition:   FU with Pranshu Lyster 1 month  Signed, Alvy Alsop [name], MD  09/09/2015 7:35 AM     [facility] [address] [place] [address] [phone] (office) [phone] (fax)

## 2015-09-09 NOTE — Patient Instructions (Signed)
Medication Instructions:  Your physician has recommended you make the following change in your medication: 1) INCREASE Lisinopril to 40 mg daily.  (the new prescription will be 40 mg tablets)  Labwork: None ordered  Testing/Procedures: None ordered  Follow-Up: Your physician recommends that you schedule a follow-up appointment in: 3 months with Dr. Curt Bears.  If you need a refill on your cardiac medications before your next appointment, please call your pharmacy.  Thank you for choosing [facility]!!   Trinidad Curet, RN [phone]

## 2015-09-10 NOTE — Progress Notes (Signed)
This encounter was created in error - please disregard.

## 2015-09-21 ENCOUNTER — Telehealth: Payer: Self-pay | Admitting: Cardiology

## 2015-09-21 NOTE — Telephone Encounter (Signed)
New Message  Pt c/o medication issue:  1. Name of Medication: Xarelto    4. What is your medication issue? This medication is too expensive. Cant the pt take an Asprin or can he have an alternate Rx

## 2015-09-21 NOTE — Telephone Encounter (Signed)
Will leave 1 month of samples at front desk for her to pick up tomorrow. Will forward to [place] to send paperwork for Xarelto assistance. Patient does not have rx coverage. Wife thanks me for helping as patient only has 1 tablet left.   She will stop by office tomorrow to pick up samples.

## 2015-09-27 NOTE — Telephone Encounter (Signed)
Application for Xarelto patient assistance mailed to patient.

## 2015-10-20 ENCOUNTER — Telehealth: Payer: Self-pay

## 2015-10-20 NOTE — Telephone Encounter (Signed)
Phone call from patient's wife stating he cannot afford Xarelto. He does not have Medicare Part "D" She thinks they would not qualify for patient assistance. Xarelto savings card left at front desk. I asked her to let us know if that did not work for him.

## 2015-10-21 ENCOUNTER — Telehealth: Payer: Self-pay | Admitting: *Deleted

## 2015-10-21 NOTE — Telephone Encounter (Signed)
Informed patient's wife that I was going to leave 3 weeks of samples at front desk while we work on options for assistance.  Explained that we may need to look into other blood thinners and their cost.   Will forward to [facility] to call patient when she is back in the office to discuss options w/ patient. Wife advised that Vaughan Basta is not in the office today.  (Per Vaughan Basta Reiland's note from yesterday:)  Brett Canales, LPN at [id number] AM     Status: Signed       Expand All Collapse All   Phone call from patient's wife stating he cannot afford Xarelto. He does not have Medicare Part "D" She thinks they would not qualify for patient assistance. Xarelto savings card left at front desk. I asked her to let us know if that did not work for him.

## 2015-10-21 NOTE — Telephone Encounter (Signed)
Patients wife calling in again today in regards to them not being able to afford the xarelto. She stated that the patient doesn't qualify for patient assistance nor were they able to use the savings card. She stated that it would be October before they would be able to get the rx due to something with their insurance. She would like for [place] to provide samples until then. Patients wife, [name] would like a call at [phone]. Please advise. Thanks, MI

## 2015-11-15 ENCOUNTER — Telehealth: Payer: Self-pay | Admitting: *Deleted

## 2015-11-15 NOTE — Telephone Encounter (Signed)
PT'S WIFE HAS QUESTIONS ABOUT HER HUSBAND SHE WANTS TO ASK BEFORE HIS UP COMING APPT, PLEASE CALL HER CELL, SHE IS ON HIS DPR.

## 2015-11-16 NOTE — Telephone Encounter (Signed)
Patients wife asking for more xarelto samples. Ok to give or does he need to be switched to something more affordable? Please advise. Thanks, MI

## 2015-11-17 NOTE — Telephone Encounter (Signed)
Wife requesting samples of Xarelto.  States they cannot afford this medication. Advised one month of samples will be left at front desk and our med assistance LPN, Vaughan Basta, will be in touch with them about this issue. She thanks me several times for helping.

## 2015-12-15 ENCOUNTER — Encounter: Payer: Self-pay | Admitting: Cardiology

## 2015-12-15 ENCOUNTER — Encounter (INDEPENDENT_AMBULATORY_CARE_PROVIDER_SITE_OTHER): Payer: Self-pay

## 2015-12-15 ENCOUNTER — Ambulatory Visit (INDEPENDENT_AMBULATORY_CARE_PROVIDER_SITE_OTHER): Payer: Medicare Other | Admitting: Cardiology

## 2015-12-15 VITALS — BP 144/80 | HR 62 | Ht 66.0 in | Wt 214.0 lb

## 2015-12-15 DIAGNOSIS — Z79899 Other long term (current) drug therapy: Secondary | ICD-10-CM

## 2015-12-15 MED ORDER — HYDROCHLOROTHIAZIDE 25 MG PO TABS: 25.0000 mg | ORAL_TABLET | Freq: Every day | ORAL | 6 refills | Status: DC

## 2015-12-15 NOTE — Patient Instructions (Addendum)
Medication Instructions:  Your physician has recommended you make the following change in your medication:  1) START Hydrochlorothiazide 25 mg daily  Labwork: Your physician recommends that you return for lab work in: 1-2 weeks for BMET.    Testing/Procedures: None ordered  Follow-Up: Your physician wants you to follow-up in: 6 months with Dr. Curt Bears. You will receive a reminder letter in the mail two months in advance. If you don't receive a letter, please call our office to schedule the follow-up appointment.  If you need a refill on your cardiac medications before your next appointment, please call your pharmacy.  Thank you for choosing [facility]!!   Trinidad Curet, RN [phone]

## 2015-12-15 NOTE — Progress Notes (Signed)
Electrophysiology Office Note   Date:  12/15/2015   ID:  Derrick Alexander, DOB 03-Jan-1946, MRN [id number]  PCP:  Horatio Pel, MD  Cardiologist:  Debara Pickett Primary Electrophysiologist:  Cordie Beazley [name], MD    Chief Complaint  Patient presents with  . Follow-up    3 months     History of Present Illness: Derrick Alexander is a 70 y.o. male who presents today for electrophysiology evaluation.     He has no significant past medical history. He only takes aspirin occasionally as needed. He is a 20 year smoker that currently smoking. Recently he's had 3 different episodes of what seemed to be brief syncope. All of the episodes were at rest and he was seated at the time. His wife said that he was talking with her and then briefly seem to lose consciousness but then was easily awakened and was not confused. He did not slump over lose control of bowel or bladder function. There was no seizure-like activity. He reported some mild prodrome to the symptoms. He felt some tingling and numbness around his scalp that felt like he was wearing a tight baseball cap as well as some graying of his vision.   Today, he denies symptoms of palpitations, chest pain, shortness of breath, orthopnea, PND, lower extremity edema, claudication, dizziness, presyncope, syncope, bleeding, or neurologic sequela. The patient is tolerating medications without difficulties and is otherwise without complaint today. He is tolerating the Xarelto without issue. He does have a home blood pressure cuff and blood pressures are running in the high 140s. He has had no further episodes of syncope and has been feeling well.   Past Medical History:  Diagnosis Date  . Abnormal EKG 06/18/2015  . Faintness 06/18/2015  . Hypertension   . LBBB (left bundle branch block) 06/18/2015  . Syncope 08/06/2015  . V-tach ([place]) 08/06/2015   No past surgical history on file.   Current Outpatient Prescriptions  Medication Sig Dispense Refill  .  lisinopril (PRINIVIL,ZESTRIL) 40 MG tablet Take 1 tablet (40 mg total) by mouth daily. 30 tablet 3  . rivaroxaban (XARELTO) 20 MG TABS tablet Take 1 tablet (20 mg total) by mouth daily with supper. 30 tablet 3   No current facility-administered medications for this visit.     Allergies:   Review of patient's allergies indicates no known allergies.   Social History:  The patient  reports that he has been smoking.  He started smoking about 60 years ago. He has never used smokeless tobacco.   Family History:  The patient's family history includes COPD in his brother; Diabetes in his mother; Heart disease in his brother.    ROS:  Please see the history of present illness.   Otherwise, review of systems is none.   All other systems are reviewed and negative.    PHYSICAL EXAM: VS:  BP (!) 144/80   Pulse 62   Ht 5\' 6"  (1.676 m)   Wt 214 lb (97.1 kg)   BMI 34.54 kg/m  , BMI Body mass index is 34.54 kg/m. GEN: Well nourished, well developed, in no acute distress  HEENT: normal  Neck: no JVD, carotid bruits, or masses Cardiac: RRR; no murmurs, rubs, or gallops,no edema  Respiratory:  clear to auscultation bilaterally, normal work of breathing GI: soft, nontender, nondistended, + BS MS: no deformity or atrophy  Skin: warm and dry Neuro:  Strength and sensation are intact Psych: euthymic mood, full affect  EKG:  EKG is not ordered today.  Warm and September a(  Recent Labs: No results found for requested labs within last 8760 hours.    Lipid Panel  No results found for: CHOL, TRIG, HDL, CHOLHDL, VLDL, LDLCALC, LDLDIRECT   Wt Readings from Last 3 Encounters:  12/15/15 214 lb (97.1 kg)  09/09/15 215 lb 9.6 oz (97.8 kg)  08/09/15 215 lb 3.2 oz (97.6 kg)      Other studies Reviewed: Additional studies/ records that were reviewed today include: Spect 06/25/15  Review of the above records today demonstrates:   The left ventricular ejection fraction is normal (55-65%).  Nuclear  stress EF: 57%.  There was no ST segment deviation noted during stress.  The study is normal.  Normal stress nuclear study with no ischemia or infarction; EF 57 with normal wall motion.  Tele monitor 06/17/14 Monitor shows NSVT up 16 beat runs and periods of ventricular escape rhythm in the 30's.   TTE 08/17/15 - The patient was in atrial fibrillation. Normal LV size with mild  LV hypertrophy. EF 65-70%. Normal RV size and systolic function.  No significant valvular abnormalities.  ASSESSMENT AND PLAN:  1.  NSVT: Short run of NSVT.  Henson Fraticelli get TTE to determine his LVEF and any other cause for VT.  May need CMRI in the future.  Has had no further symptoms with a normal EF.  2. Bradycardia: was at night when sleeping.  No current symptoms and did not have syncope while wearing the monitor.  No further syncope or evidence of bradycardia.  3. Syncope: no symptoms since most recent event and no evidence of bradycardia while awake on monitor.    4. Atrial fibrillation: Currently on rivaroxaban and tolerating it well. Currently asymptomatic.  currently, he is having trouble affording his medications. We have given him samples of rivaroxaban. We Margot Oriordan see if he can get assistance for Eliquis, but if not would potentially benefit from switching to Coumadin. We Fatina Sprankle call him back in one week to discuss.   5. Hypertension: started lisinopril. Continued elevated blood pressures. Neeti Knudtson start 5 mg of Norvasc.   Current medicines are reviewed at length with the patient today.   The patient does not have concerns regarding his medicines.  The following changes were made today: start Norvasc  Labs/ tests ordered today include:  No orders of the defined types were placed in this encounter.    Disposition:   FU with Anyelina Claycomb 3  months  Signed, Rudra Hobbins [name], MD  12/15/2015 4:27 PM     [place] [address] [phone]  (office) [phone] (fax)

## 2015-12-30 ENCOUNTER — Other Ambulatory Visit: Payer: Medicare Other | Admitting: *Deleted

## 2015-12-30 DIAGNOSIS — Z79899 Other long term (current) drug therapy: Secondary | ICD-10-CM

## 2015-12-30 LAB — BASIC METABOLIC PANEL
BUN: 17 mg/dL (ref 7–25)
CO2: 26 mmol/L (ref 20–31)
Calcium: 9.2 mg/dL (ref 8.6–10.3)
Chloride: 103 mmol/L (ref 98–110)
Creat: 0.88 mg/dL (ref 0.70–1.18)
GLUCOSE: 102 mg/dL — AB (ref 65–99)
POTASSIUM: 3.9 mmol/L (ref 3.5–5.3)
SODIUM: 136 mmol/L (ref 135–146)

## 2016-01-06 ENCOUNTER — Other Ambulatory Visit: Payer: Self-pay | Admitting: Cardiology

## 2016-01-11 ENCOUNTER — Telehealth: Payer: Self-pay | Admitting: *Deleted

## 2016-01-11 NOTE — Telephone Encounter (Signed)
Patients wife calling about samples. She states they are unable to afford medication and are trying to get insurance to cover his medication.  A month's worth of samples were placed at the front desk with a zero co-pay card to see if they qualify.

## 2016-02-07 ENCOUNTER — Telehealth: Payer: Self-pay | Admitting: *Deleted

## 2016-02-07 NOTE — Telephone Encounter (Signed)
called to inform wife that we left samples up front, pt aware.

## 2016-02-07 NOTE — Telephone Encounter (Signed)
Will forward to [facility] to discuss with patient and see what assistance is available.  She spoke with them in May on this matter. Ok to leave samples for patient

## 2016-02-07 NOTE — Telephone Encounter (Signed)
Attempted to call patient's wife just now. No answer, no voicemail. We have never done Patient Assistance for him, through our office.

## 2016-02-07 NOTE — Telephone Encounter (Signed)
wants Xarelto samples, have given them to her several times, said they applied for help, what is next step and do you want to leave her some samples? please call her & advise me if you want me to or if you will leave samples for him, he has 3 pills left.

## 2016-02-09 NOTE — Telephone Encounter (Signed)
Spoke with Derrick Alexander just now. Advised her that I would mail an application for Xarelto assistance. She is agreeable to this plan.

## 2016-03-08 ENCOUNTER — Telehealth: Payer: Self-pay | Admitting: Cardiology

## 2016-03-08 NOTE — Telephone Encounter (Signed)
Wife asking if we received paperwork for financial assitance with Xarelto.  Informed that I did and was giving to [place] when she returns to the office tomorrow.  Wife verbalized understanding. Given 2 weeks of samples as patient only has 3 days left. She thanks [place] for helping with this.

## 2016-03-08 NOTE — Telephone Encounter (Signed)
New Message:     Please call,concerning pt's Xarelto.

## 2016-03-15 ENCOUNTER — Telehealth: Payer: Self-pay

## 2016-03-15 NOTE — Telephone Encounter (Signed)
Application for Xarelto 20mg  sent to [employer].

## 2016-03-21 ENCOUNTER — Telehealth: Payer: Self-pay | Admitting: Cardiology

## 2016-03-21 NOTE — Telephone Encounter (Signed)
Samples placed at the front desk for patient. Have called the patient several times with no success.

## 2016-03-21 NOTE — Telephone Encounter (Signed)
Patient is running low on samples of Xarelto.  Please let wife know when / if  she can pick some up

## 2016-04-05 ENCOUNTER — Telehealth: Payer: Self-pay | Admitting: Cardiology

## 2016-04-05 NOTE — Telephone Encounter (Signed)
New message      Patient calling the office for samples of medication:   1.  What medication and dosage are you requesting samples for?  xarelto 20mg   2.  Are you currently out of this medication? Pt has 2 pills left.  He cannot afford the medication.  He will have coverage starting 2018

## 2016-04-05 NOTE — Telephone Encounter (Signed)
called pt regarding xarelto 20 mg. called to see if they have heard from pt asst. Derrick Alexander stated they have not heard back from PA, will leave 2 weeks at front desk. pt aware.

## 2016-04-17 ENCOUNTER — Telehealth: Payer: Self-pay

## 2016-04-17 NOTE — Telephone Encounter (Signed)
Jenean Lindau from our office is handling this.   See telephone note for today.

## 2016-04-17 NOTE — Telephone Encounter (Signed)
Spoke with patient's wife today. I let her know that we received a denial letter from [employer] as well. We have provided 2 more bottles of Xarelto 20mg . Hopefully this will get him through to [place] when [facility] begins. Wife is grateful for all help.

## 2016-05-03 ENCOUNTER — Encounter: Payer: Self-pay | Admitting: Cardiology

## 2016-05-04 ENCOUNTER — Other Ambulatory Visit: Payer: Self-pay | Admitting: Cardiology

## 2016-05-09 ENCOUNTER — Encounter: Payer: Self-pay | Admitting: Cardiology

## 2016-05-09 ENCOUNTER — Ambulatory Visit (INDEPENDENT_AMBULATORY_CARE_PROVIDER_SITE_OTHER): Payer: Medicare Other | Admitting: Cardiology

## 2016-05-09 VITALS — BP 148/76 | HR 74 | Ht 66.0 in | Wt 217.6 lb

## 2016-05-09 DIAGNOSIS — I472 Ventricular tachycardia, unspecified: Secondary | ICD-10-CM

## 2016-05-09 DIAGNOSIS — I480 Paroxysmal atrial fibrillation: Secondary | ICD-10-CM | POA: Diagnosis not present

## 2016-05-09 MED ORDER — AMLODIPINE BESYLATE 5 MG PO TABS: 5.0000 mg | ORAL_TABLET | Freq: Every day | ORAL | 6 refills | Status: DC

## 2016-05-09 NOTE — Progress Notes (Signed)
Electrophysiology Office Note   Date:  05/09/2016   ID:  Derrick Alexander, DOB 1946/04/20, MRN [id number]  PCP:  Horatio Pel, MD  Cardiologist:  Debara Pickett Primary Electrophysiologist:  Maxamus Colao [name], MD    Chief Complaint  Patient presents with  . Follow-up    PAF     History of Present Illness: Derrick Alexander is a 70 y.o. male who presents today for electrophysiology evaluation.     He has no significant past medical history. He only takes aspirin occasionally as needed. He is a 73 year smoker that currently smoking. Recently he's had 3 different episodes of what seemed to be brief syncope. All of the episodes were at rest and he was seated at the time. His wife said that he was talking with her and then briefly seem to lose consciousness but then was easily awakened and was not confused. He did not slump over lose control of bowel or bladder function. There was no seizure-like activity. He reported some mild prodrome to the symptoms. He felt some tingling and numbness around his scalp that felt like he was wearing a tight baseball cap as well as some graying of his vision.   Today, he denies symptoms of palpitations, chest pain, shortness of breath, orthopnea, PND, lower extremity edema, claudication, dizziness, presyncope, syncope, bleeding, or neurologic sequela. The patient is tolerating medications without difficulties and is otherwise without complaint today. He is tolerating the Xarelto without issue. He does have a home blood pressure cuff and blood pressures are running in the high 140s. He has had no further episodes of syncope and has been feeling well.   Past Medical History:  Diagnosis Date  . Abnormal EKG 06/18/2015  . Faintness 06/18/2015  . Hypertension   . LBBB (left bundle branch block) 06/18/2015  . Syncope 08/06/2015  . V-tach ([place]) 08/06/2015   No past surgical history on file.   Current Outpatient Prescriptions  Medication Sig Dispense Refill  .  hydrochlorothiazide (HYDRODIURIL) 25 MG tablet Take 1 tablet (25 mg total) by mouth daily. 30 tablet 6  . lisinopril (PRINIVIL,ZESTRIL) 40 MG tablet take 1 tablet by mouth once daily 30 tablet 7  . rivaroxaban (XARELTO) 20 MG TABS tablet Take 1 tablet (20 mg total) by mouth daily with supper. 30 tablet 3   No current facility-administered medications for this visit.     Allergies:   Patient has no known allergies.   Social History:  The patient  reports that he has been smoking.  He started smoking about 61 years ago. He has never used smokeless tobacco. He reports that he does not drink alcohol or use drugs.   Family History:  The patient's family history includes COPD in his brother; Diabetes in his mother; Heart disease in his brother.    ROS:  Please see the history of present illness.   Otherwise, review of systems is none.   All other systems are reviewed and negative.    PHYSICAL EXAM: VS:  BP (!) 148/76   Pulse 74   Ht 5\' 6"  (1.676 m)   Wt 217 lb 9.6 oz (98.7 kg)   BMI 35.12 kg/m  , BMI Body mass index is 35.12 kg/m. GEN: Well nourished, well developed, in no acute distress  HEENT: normal  Neck: no JVD, carotid bruits, or masses Cardiac: RRR; no murmurs, rubs, or gallops,no edema  Respiratory:  clear to auscultation bilaterally, normal work of breathing GI: soft, nontender, nondistended, + BS MS: no deformity or  atrophy  Skin: warm and dry Neuro:  Strength and sensation are intact Psych: euthymic mood, full affect  EKG:  EKG is not ordered today. Warm and September a(  Recent Labs: 12/30/2015: BUN 17; Creat 0.88; Potassium 3.9; Sodium 136    Lipid Panel  No results found for: CHOL, TRIG, HDL, CHOLHDL, VLDL, LDLCALC, LDLDIRECT   Wt Readings from Last 3 Encounters:  05/09/16 217 lb 9.6 oz (98.7 kg)  12/15/15 214 lb (97.1 kg)  09/09/15 215 lb 9.6 oz (97.8 kg)      Other studies Reviewed: Additional studies/ records that were reviewed today include: Spect  06/25/15  Review of the above records today demonstrates:   The left ventricular ejection fraction is normal (55-65%).  Nuclear stress EF: 57%.  There was no ST segment deviation noted during stress.  The study is normal.  Normal stress nuclear study with no ischemia or infarction; EF 57 with normal wall motion.  Tele monitor 06/17/14 Monitor shows NSVT up 16 beat runs and periods of ventricular escape rhythm in the 30's.   TTE 08/17/15 - The patient was in atrial fibrillation. Normal LV size with mild  LV hypertrophy. EF 65-70%. Normal RV size and systolic function.  No significant valvular abnormalities.  ASSESSMENT AND PLAN:  1.  NSVT: Short run of NSVT.  Aoki Wedemeyer get TTE to determine his LVEF and any other cause for VT.  really feeling well without major palpitations. Continue current management.  2. Bradycardia: was at night when sleeping.  No current symptoms and did not have syncope while wearing the monitor.  No further syncope or evidence of bradycardia. Working to avoid beta blockers and calcium channel blockers.  3. Syncope: no symptoms since most recent event and no evidence of bradycardia while awake on monitor.    4. Atrial fibrillation: Currently on rivaroxaban and tolerating it well. Currently asymptomatic.    This patients CHA2DS2-VASc Score and unadjusted Ischemic Stroke Rate (% per year) is equal to 2.2 % stroke rate/year from a score of 2  Above score calculated as 1 point each if present [CHF, HTN, DM, Vascular=MI/PAD/Aortic Plaque, Age if 65-74, or Male] Above score calculated as 2 points each if present [Age > 75, or Stroke/TIA/TE]  5. Hypertension: started lisinopril. Continued elevated blood pressures. Did not start Norvasc at the last visit. We'll restart today.   Current medicines are reviewed at length with the patient today.   The patient does not have concerns regarding his medicines.  The following changes were made today: start Norvasc  Labs/  tests ordered today include:  No orders of the defined types were placed in this encounter.    Disposition:   FU with Markeshia Giebel 6  months  Signed, Tyjon Bowen [name], MD  05/09/2016 4:01 PM     [place] [address] [phone] (office) [phone] (fax)

## 2016-05-09 NOTE — Addendum Note (Signed)
Addended by: Stanton Kidney on: 05/09/2016 04:37 PM   Modules accepted: Orders

## 2016-05-09 NOTE — Patient Instructions (Addendum)
Medication Instructions:    Your physician has recommended you make the following change in your medication:  1) START Norvasc 5 mg once daily  --- If you need a refill on your cardiac medications before your next appointment, please call your pharmacy. ---  Labwork:  None ordered  Testing/Procedures:  None ordered  Follow-Up:  Your physician wants you to follow-up in: 6 months with Dr. Curt Bears.  You will receive a reminder letter in the mail two months in advance. If you don't receive a letter, please call our office to schedule the follow-up appointment.  Thank you for choosing [facility]!!   Trinidad Curet, RN [phone]  Any Other Special Instructions Will Be Listed Below (If Applicable).  Diltiazem tablets What is this medicine? DILTIAZEM (dil TYE a zem) is a calcium-channel blocker. It affects the amount of calcium found in your heart and muscle cells. This relaxes your blood vessels, which can reduce the amount of work the heart has to do. This medicine is used to treat chest pain caused by angina. This medicine may be used for other purposes; ask your health care provider or pharmacist if you have questions. COMMON BRAND NAME(S): Cardizem What should I tell my health care provider before I take this medicine? They need to know if you have any of these conditions: -heart problems, low blood pressure, irregular heartbeat -liver disease -previous heart attack -an unusual or allergic reaction to diltiazem, other medicines, foods, dyes, or preservatives -pregnant or trying to get pregnant -breast-feeding How should I use this medicine? Take this medicine by mouth with a glass of water. Follow the directions on the prescription label. Do not cut, crush or chew this medicine. This medicine is usually taken before meals and at bedtime. Take your doses at regular intervals. Do not take your medicine more often then directed. Do not stop taking except on the advice of  your doctor or health care professional. Talk to your pediatrician regarding the use of this medicine in children. Special care may be needed. Overdosage: If you think you have taken too much of this medicine contact a poison control center or emergency room at once. NOTE: This medicine is only for you. Do not share this medicine with others. What if I miss a dose? If you miss a dose, take it as soon as you can. If it is almost time for your next dose, take only that dose. Do not take double or extra doses. What may interact with this medicine? Do not take this medicine with any of the following: -cisapride -hawthorn -pimozide -ranolazine -red yeast rice This medicine may also interact with the following medications: -buspirone -carbamazepine -cimetidine -cyclosporine -digoxin -local anesthetics or general anesthetics -lovastatin -medicines for anxiety or difficulty sleeping like midazolam and triazolam -medicines for high blood pressure or heart problems -quinidine -rifampin, rifabutin, or rifapentine This list may not describe all possible interactions. Give your health care provider a list of all the medicines, herbs, non-prescription drugs, or dietary supplements you use. Also tell them if you smoke, drink alcohol, or use illegal drugs. Some items may interact with your medicine. What should I watch for while using this medicine? Check your blood pressure and pulse rate regularly. Ask your doctor or health care professional what your blood pressure and pulse rate should be and when you should contact him or her. You may feel dizzy or lightheaded. Do not drive, use machinery, or do anything that needs mental alertness until you know how this medicine  affects you. To reduce the risk of dizzy or fainting spells, do not sit or stand up quickly, especially if you are an older patient. Alcohol can make you more dizzy or increase flushing and rapid heartbeats. Avoid alcoholic drinks. What  side effects may I notice from receiving this medicine? Side effects that you should report to your doctor or health care professional as soon as possible: -allergic reactions like skin rash, itching or hives, swelling of the face, lips, or tongue -confusion, mental depression -feeling faint or lightheaded, falls -pinpoint red spots on the skin -redness, blistering, peeling or loosening of the skin, including inside the mouth -slow, irregular heartbeat -swelling of the ankles, feet -unusual bleeding or bruising Side effects that usually do not require medical attention (report to your doctor or health care professional if they continue or are bothersome): -change in sex drive or performance -constipation or diarrhea -flushing of the face -headache -nausea, vomiting -tired or weak -trouble sleeping This list may not describe all possible side effects. Call your doctor for medical advice about side effects. You may report side effects to FDA at 1-800-FDA-1088. Where should I keep my medicine? Keep out of the reach of children. Store at room temperature between 20 and 25 degrees C (68 and 77 degrees F). Protect from light. Keep container tightly closed. Throw away any unused medicine after the expiration date. NOTE: This sheet is a summary. It may not cover all possible information. If you have questions about this medicine, talk to your doctor, pharmacist, or health care provider.  2017 Elsevier/Gold Standard (2013-04-21 10:54:31)

## 2016-05-17 ENCOUNTER — Telehealth: Payer: Self-pay | Admitting: *Deleted

## 2016-05-17 NOTE — Telephone Encounter (Signed)
pt has been getting samples all year, we can provide one more bottle to get him to Jan 1st, we can not supply anymore samples, if insurance doesnt cover this they will have to look into an alternative, will forward to [employer].

## 2016-05-24 ENCOUNTER — Telehealth: Payer: Self-pay | Admitting: Cardiology

## 2016-05-24 NOTE — Telephone Encounter (Signed)
Walk In pt Form-pt needs rx resent to pharmacy-gave to [place] to take around to [place].

## 2016-05-25 ENCOUNTER — Other Ambulatory Visit: Payer: Self-pay | Admitting: *Deleted

## 2016-05-25 MED ORDER — RIVAROXABAN 20 MG PO TABS: 20.0000 mg | ORAL_TABLET | Freq: Every day | ORAL | 11 refills | Status: DC

## 2016-05-26 NOTE — Telephone Encounter (Signed)
Received form today Dated: 05/23/16   Form stated:            Insurance update...the patient requested to possibly resend rx in to see if will be covered.  Xarelto/[facility]  Please call pt for further questions.  This was handled by refill department yesterday -  sent rx w/ refills for Xarelto

## 2016-09-02 ENCOUNTER — Other Ambulatory Visit: Payer: Self-pay | Admitting: Cardiology

## 2016-10-16 IMAGING — NM NM MISC PROCEDURE
6 series · 36 of 36 positions shown · non-contrast
Comparison: none

[Series 1: wbr rest · 6.40mm/px · 6 of 64 frames shown]
[frame 6/64]
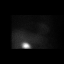
[frame 16/64]
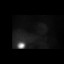
[frame 27/64]
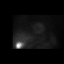
[frame 38/64]
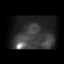
[frame 48/64]
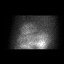
[frame 59/64]
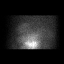

[Series 1: wbr_r-proj_st wbr rest · 6.40mm/px · 6 of 64 frames shown]
[frame 6/64]
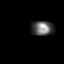
[frame 16/64]
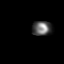
[frame 27/64]
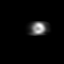
[frame 38/64]
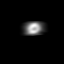
[frame 48/64]
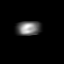
[frame 59/64]
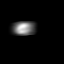

[Series 2: wbr stress-gsp · 6.40mm/px · 6 of 512 frames shown]
[frame 43/512]
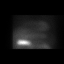
[frame 128/512]
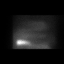
[frame 214/512]
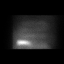
[frame 299/512]
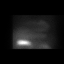
[frame 384/512]
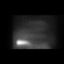
[frame 470/512]
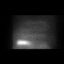

[Series 2: wbr_s-proj_st wbr stress-gsp · 6.40mm/px · 6 of 512 frames shown]
[frame 43/512]
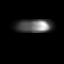
[frame 128/512]
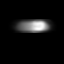
[frame 214/512]
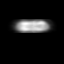
[frame 299/512]
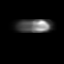
[frame 384/512]
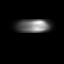
[frame 470/512]
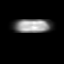

[Series 3: wbr_s-proj_st wbr stress-sum-em · 6.40mm/px · 6 of 64 frames shown]
[frame 6/64]
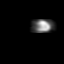
[frame 16/64]
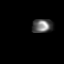
[frame 27/64]
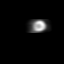
[frame 38/64]
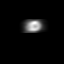
[frame 48/64]
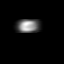
[frame 59/64]
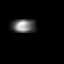

[Series 3: wbr stress-sum-em · 6.40mm/px · 6 of 64 frames shown]
[frame 6/64]
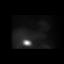
[frame 16/64]
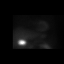
[frame 27/64]
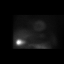
[frame 38/64]
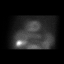
[frame 48/64]
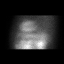
[frame 59/64]
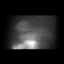

[36 of 36 positions shown; findings below may reference images not displayed]

Canned report from images found in remote index.

Refer to host system for actual result text.

## 2016-10-18 ENCOUNTER — Encounter: Payer: Self-pay | Admitting: Cardiology

## 2016-11-07 ENCOUNTER — Encounter: Payer: Self-pay | Admitting: Cardiology

## 2016-11-07 ENCOUNTER — Ambulatory Visit (INDEPENDENT_AMBULATORY_CARE_PROVIDER_SITE_OTHER): Payer: Medicare Other | Admitting: Cardiology

## 2016-11-07 ENCOUNTER — Encounter (INDEPENDENT_AMBULATORY_CARE_PROVIDER_SITE_OTHER): Payer: Self-pay

## 2016-11-07 VITALS — BP 138/82 | HR 71 | Ht 66.0 in | Wt 226.0 lb

## 2016-11-07 DIAGNOSIS — I10 Essential (primary) hypertension: Secondary | ICD-10-CM | POA: Diagnosis not present

## 2016-11-07 DIAGNOSIS — I480 Paroxysmal atrial fibrillation: Secondary | ICD-10-CM

## 2016-11-07 DIAGNOSIS — I472 Ventricular tachycardia: Secondary | ICD-10-CM

## 2016-11-07 DIAGNOSIS — I4729 Other ventricular tachycardia: Secondary | ICD-10-CM

## 2016-11-07 NOTE — Progress Notes (Signed)
Electrophysiology Office Note   Date:  11/07/2016   ID:  Derrick Alexander, DOB [birth year], MRN [id number]  PCP:  [name], MD  Cardiologist:  Debara Pickett Primary Electrophysiologist:  Will [name], MD    Chief Complaint  Patient presents with  . Follow-up    EKG     History of Present Illness: Derrick Alexander is a 71 y.o. male who presents today for electrophysiology evaluation.     He has been feeling well. He has had no further issues with syncope since last being seen. He does not note palpitations or shortness of breath. He has gained approximately 8 pounds and is working to lose that weight. He does continue to smoke, though he has significantly cut back.  Today, denies symptoms of palpitations, chest pain, shortness of breath, orthopnea, PND, lower extremity edema, claudication, dizziness, presyncope, syncope, bleeding, or neurologic sequela. The patient is tolerating medications without difficulties and is otherwise without complaint today.    Past Medical History:  Diagnosis Date  . Abnormal EKG 06/18/2015  . Faintness 06/18/2015  . Hypertension   . LBBB (left bundle branch block) 06/18/2015  . Syncope 08/06/2015  . V-tach ([place]) 08/06/2015   No past surgical history on file.   Current Outpatient Prescriptions  Medication Sig Dispense Refill  . amLODipine (NORVASC) 5 MG tablet Take 1 tablet (5 mg total) by mouth daily. 30 tablet 6  . hydrochlorothiazide (HYDRODIURIL) 25 MG tablet take 1 tablet by mouth once daily 30 tablet 7  . lisinopril (PRINIVIL,ZESTRIL) 40 MG tablet take 1 tablet by mouth once daily 30 tablet 7  . rivaroxaban (XARELTO) 20 MG TABS tablet Take 1 tablet (20 mg total) by mouth daily with supper. 30 tablet 11   No current facility-administered medications for this visit.     Allergies:   Patient has no known allergies.   Social History:  The patient  reports that he has been smoking.  He started smoking about 61 years ago. He has never used  smokeless tobacco. He reports that he does not drink alcohol or use drugs.   Family History:  The patient's family history includes COPD in his brother; Diabetes in his mother; Heart disease in his brother.  ROS:  Please see the history of present illness.   Otherwise, review of systems is positive for Fatigue, cough, dyspnea on exertion, snoring, wheezing, easy bruising.   All other systems are reviewed and negative.     PHYSICAL EXAM: VS:  BP 138/82   Pulse 71   Ht 5\' 6"  (1.676 m)   Wt 226 lb (102.5 kg)   BMI 36.48 kg/m  , BMI Body mass index is 36.48 kg/m. GEN: Well nourished, well developed, in no acute distress  HEENT: normal  Neck: no JVD, carotid bruits, or masses Cardiac: RRR; no murmurs, rubs, or gallops,no edema  Respiratory:  clear to auscultation bilaterally, normal work of breathing GI: soft, nontender, nondistended, + BS MS: no deformity or atrophy  Skin: warm and dry Neuro:  Strength and sensation are intact Psych: euthymic mood, full affect  EKG:  EKG is ordered today. Personal review of the ekg ordered shows sinus rhythm, left anterior fascicular block, nonspecific T wave changes  Recent Labs: 12/30/2015: BUN 17; Creat 0.88; Potassium 3.9; Sodium 136    Lipid Panel  No results found for: CHOL, TRIG, HDL, CHOLHDL, VLDL, LDLCALC, LDLDIRECT   Wt Readings from Last 3 Encounters:  11/07/16 226 lb (102.5 kg)  05/09/16 217 lb 9.6 oz (98.7  kg)  12/15/15 214 lb (97.1 kg)      Other studies Reviewed: Additional studies/ records that were reviewed today include: Spect 06/25/15  Review of the above records today demonstrates:   The left ventricular ejection fraction is normal (55-65%).  Nuclear stress EF: 57%.  There was no ST segment deviation noted during stress.  The study is normal.  Normal stress nuclear study with no ischemia or infarction; EF 57 with normal wall motion.  Tele monitor 06/17/14 Monitor shows NSVT up 16 beat runs and periods of  ventricular escape rhythm in the 30's.   TTE 08/17/15 - Left ventricle: The cavity size was normal. Wall thickness was   increased in a pattern of mild LVH. Systolic function was   vigorous. The estimated ejection fraction was in the range of 65%   to 70%. Indeterminant diastolic function. Although no diagnostic   regional wall motion abnormality was identified, this possibility   cannot be completely excluded on the basis of this study. - Aortic valve: There was trivial regurgitation. - Aorta: Mildly dilated ascending aorta. Ascending aortic diameter:   38 mm (S). - Mitral valve: Mildly calcified annulus. There was no significant   regurgitation. - Right ventricle: The cavity size was normal. Systolic function   was normal. - Pulmonary arteries: No complete TR doppler jet so unable to   estimate PA systolic pressure. - Systemic veins: IVC measured 1.8 cm with < 50% respirophasic   variation, suggesting RA pressure 8 mmHg.  ASSESSMENT AND PLAN:  1.  NSVT: Short runs of nonsustained VT on his cardiac monitor. Ejection fraction is normal. No intervention at this time.  2. Bradycardia: Currently well he was sleeping. He does snore and thus may benefit from a sleep study in the future.  3. Syncope: No further episodes of syncope since last being seen. No medical changes at this time.  4. Atrial fibrillation: On Xarelto.  5. Hypertension: Well-controlled on Norvasc, HCTZ, and lisinopril  This patients CHA2DS2-VASc Score and unadjusted Ischemic Stroke Rate (% per year) is equal to 4.8 % stroke rate/year from a score of 4  Above score calculated as 1 point each if present [CHF, HTN, DM, Vascular=MI/PAD/Aortic Plaque, Age if 65-74, or Male] Above score calculated as 2 points each if present [Age > 75, or Stroke/TIA/TE]   Current medicines are reviewed at length with the patient today.   The patient does not have concerns regarding his medicines.  The following changes were made  today: none  Labs/ tests ordered today include:  No orders of the defined types were placed in this encounter.    Disposition:   FU with Will Camnitz 6  months  Signed, Will [name], MD  11/07/2016 4:25 PM     [address] [place] [address] [phone] (office) [phone] (fax)

## 2016-11-07 NOTE — Patient Instructions (Signed)
Medication Instructions:  Your physician recommends that you continue on your current medications as directed. Please refer to the Current Medication list given to you today.  If you need a refill on your cardiac medications before your next appointment, please call your pharmacy.   Labwork: None ordered  Testing/Procedures: None ordered  Follow-Up: Your physician wants you to follow-up in: 6 months  with Dr. Camnitz.  You will receive a reminder letter in the mail two months in advance. If you don't receive a letter, please call our office to schedule the follow-up appointment.  Thank you for choosing [facility]!!   Brenlynn Fake, RN [phone]  Any Other Special Instructions Will Be Listed Below (If Applicable).        

## 2016-12-31 ENCOUNTER — Other Ambulatory Visit: Payer: Self-pay | Admitting: Cardiology

## 2017-01-21 ENCOUNTER — Other Ambulatory Visit: Payer: Self-pay | Admitting: Cardiology

## 2017-05-09 ENCOUNTER — Encounter: Payer: Self-pay | Admitting: Cardiology

## 2017-05-09 ENCOUNTER — Ambulatory Visit: Payer: Medicare Other | Admitting: Cardiology

## 2017-05-09 VITALS — BP 138/82 | HR 62 | Ht 66.0 in | Wt 228.2 lb

## 2017-05-09 DIAGNOSIS — I480 Paroxysmal atrial fibrillation: Secondary | ICD-10-CM

## 2017-05-09 DIAGNOSIS — I10 Essential (primary) hypertension: Secondary | ICD-10-CM

## 2017-05-09 DIAGNOSIS — I472 Ventricular tachycardia, unspecified: Secondary | ICD-10-CM

## 2017-05-09 NOTE — Patient Instructions (Signed)
Medication Instructions:  Your physician recommends that you continue on your current medications as directed. Please refer to the Current Medication list given to you today.  If you need a refill on your cardiac medications before your next appointment, please call your pharmacy.   Labwork: None ordered  Testing/Procedures: None ordered  Follow-Up: Your physician wants you to follow-up in: 6 months with Dr. Camnitz.  You will receive a reminder letter in the mail two months in advance. If you don't receive a letter, please call our office to schedule the follow-up appointment.  Thank you for choosing [facility]!!   Laurell Coalson, RN [phone]         

## 2017-05-09 NOTE — Progress Notes (Signed)
Electrophysiology Office Note   Date:  05/09/2017   ID:  [name], DOB 1945/08/30, MRN [id number]  PCP:  [name], MD  Cardiologist:  Debara Pickett Primary Electrophysiologist:  Deanna Boehlke [name], MD    Chief Complaint  Patient presents with  . Atrial Fibrillation     History of Present Illness: Derrick Alexander is a 71 y.o. male who presents today for electrophysiology evaluation.  He has a history of hypertension, left bundle branch block, tobacco abuse with a 60-pack-year smoking history, and nonsustained VT.  He also has paroxysmal atrial fibrillation.  He had an episode of syncope and wore a cardiac monitor that showed no obvious causes for syncope but a 16 beat run of nonsustained VT.  He also had bradycardia into the 30s.  Today, denies symptoms of palpitations, chest pain, shortness of breath, orthopnea, PND, lower extremity edema, claudication, dizziness, presyncope, syncope, bleeding, or neurologic sequela. The patient is tolerating medications without difficulties.  He is feeling well today without issue.  He has continued to work to cut back on his smoking.  He is down to 1-2 packs/week.   Past Medical History:  Diagnosis Date  . Abnormal EKG 06/18/2015  . Faintness 06/18/2015  . Hypertension   . LBBB (left bundle branch block) 06/18/2015  . Syncope 08/06/2015  . V-tach ([place]) 08/06/2015   No past surgical history on file.   Current Outpatient Medications  Medication Sig Dispense Refill  . amLODipine (NORVASC) 5 MG tablet TAKE 1 TABLET BY MOUTH ONCE DAILY 30 tablet 7  . hydrochlorothiazide (HYDRODIURIL) 25 MG tablet take 1 tablet by mouth once daily 30 tablet 7  . lisinopril (PRINIVIL,ZESTRIL) 40 MG tablet TAKE 1 TABLET BY MOUTH ONCE DAILY 30 tablet 11  . nicotine (NICODERM CQ - DOSED IN MG/24 HOURS) 21 mg/24hr patch Place 21 mg onto the skin daily.    . rivaroxaban (XARELTO) 20 MG TABS tablet Take 1 tablet (20 mg total) by mouth daily with supper. 30 tablet 11   No  current facility-administered medications for this visit.     Allergies:   Patient has no known allergies.   Social History:  The patient  reports that he has been smoking.  He started smoking about 62 years ago. he has never used smokeless tobacco. He reports that he does not drink alcohol or use drugs.   Family History:  The patient's family history includes COPD in his brother; Diabetes in his mother; Heart disease in his brother.   ROS:  Please see the history of present illness.   Otherwise, review of systems is positive for cough, wheezing.   All other systems are reviewed and negative.   PHYSICAL EXAM: VS:  BP 138/82   Pulse 62   Ht 5\' 6"  (1.676 m)   Wt 228 lb 3.2 oz (103.5 kg)   BMI 36.83 kg/m  , BMI Body mass index is 36.83 kg/m. GEN: Well nourished, well developed, in no acute distress  HEENT: normal  Neck: no JVD, carotid bruits, or masses Cardiac: RRR; no murmurs, rubs, or gallops,no edema  Respiratory:  clear to auscultation bilaterally, normal work of breathing GI: soft, nontender, nondistended, + BS MS: no deformity or atrophy  Skin: warm and dry Neuro:  Strength and sensation are intact Psych: euthymic mood, full affect  EKG:  EKG is ordered today. Personal review of the ekg ordered shows sinus rhythm, T wave abnormality, rate 62, LAFB   Recent Labs: No results found for requested labs within last  8760 hours.    Lipid Panel  No results found for: CHOL, TRIG, HDL, CHOLHDL, VLDL, LDLCALC, LDLDIRECT   Wt Readings from Last 3 Encounters:  05/09/17 228 lb 3.2 oz (103.5 kg)  11/07/16 226 lb (102.5 kg)  05/09/16 217 lb 9.6 oz (98.7 kg)      Other studies Reviewed: Additional studies/ records that were reviewed today include: Spect 06/25/15  Review of the above records today demonstrates:   The left ventricular ejection fraction is normal (55-65%).  Nuclear stress EF: 57%.  There was no ST segment deviation noted during stress.  The study is  normal.  Normal stress nuclear study with no ischemia or infarction; EF 57 with normal wall motion.  Tele monitor 06/17/14 Monitor shows NSVT up 16 beat runs and periods of ventricular escape rhythm in the 30's.   TTE 08/17/15 - Left ventricle: The cavity size was normal. Wall thickness was   increased in a pattern of mild LVH. Systolic function was   vigorous. The estimated ejection fraction was in the range of 65%   to 70%. Indeterminant diastolic function. Although no diagnostic   regional wall motion abnormality was identified, this possibility   cannot be completely excluded on the basis of this study. - Aortic valve: There was trivial regurgitation. - Aorta: Mildly dilated ascending aorta. Ascending aortic diameter:   38 mm (S). - Mitral valve: Mildly calcified annulus. There was no significant   regurgitation. - Right ventricle: The cavity size was normal. Systolic function   was normal. - Pulmonary arteries: No complete TR doppler jet so unable to   estimate PA systolic pressure. - Systemic veins: IVC measured 1.8 cm with < 50% respirophasic   variation, suggesting RA pressure 8 mmHg.  ASSESSMENT AND PLAN:  1.  NSVT: He is overall been feeling well.  No episodes of palpitations.  No changes.  2. Bradycardia: Not occurred during waking hours.  No changes.  3. Syncope: No episodes of syncope.  No changes.  4. Paroxysmal atrial fibrillation: Currently on Xarelto.  Does not have symptoms of atrial fibrillation recently.  No need for rate control at this time.  This patients CHA2DS2-VASc Score and unadjusted Ischemic Stroke Rate (% per year) is equal to 4.8 % stroke rate/year from a score of 4  Above score calculated as 1 point each if present [CHF, HTN, DM, Vascular=MI/PAD/Aortic Plaque, Age if 65-74, or Male] Above score calculated as 2 points each if present [Age > 75, or Stroke/TIA/TE]    5. Hypertension: Blood pressure well controlled on Norvasc,  hydrochlorothiazide, and lisinopril.   Current medicines are reviewed at length with the patient today.   The patient does not have concerns regarding his medicines.  The following changes were made today: None  Labs/ tests ordered today include:  No orders of the defined types were placed in this encounter.    Disposition:   FU with Amara Manalang 6  months  Signed, Azrael Huss [name], MD  05/09/2017 8:43 AM     [facility] [address] [place] [address] [phone] (office) [phone] (fax)

## 2017-05-27 ENCOUNTER — Other Ambulatory Visit: Payer: Self-pay | Admitting: Cardiology

## 2017-05-29 ENCOUNTER — Telehealth: Payer: Self-pay | Admitting: Cardiology

## 2017-05-29 NOTE — Telephone Encounter (Signed)
°*  STAT* If patient is at the pharmacy, call can be transferred to refill team.   1. Which medications need to be refilled? (please list name of each medication and dose if known) Xarelto  2. Which pharmacy/location (including street and city if local pharmacy) is medication to be sent to?[facility] [phone]  3. Do they need a 30 day or 90 day supply? 90 and refills

## 2017-05-29 NOTE — Telephone Encounter (Signed)
Xarelto Rx sent to pharmacy with 2 refills. Wife understands I will be in touch about lab work.

## 2017-05-29 NOTE — Telephone Encounter (Signed)
Informed wife refills sent to pharmacy.  She understands I will be in touch about lab work.

## 2017-06-13 ENCOUNTER — Emergency Department (HOSPITAL_COMMUNITY): Payer: Medicare Other

## 2017-06-13 ENCOUNTER — Encounter (HOSPITAL_COMMUNITY): Payer: Self-pay | Admitting: Emergency Medicine

## 2017-06-13 ENCOUNTER — Other Ambulatory Visit: Payer: Self-pay

## 2017-06-13 ENCOUNTER — Inpatient Hospital Stay (HOSPITAL_COMMUNITY)
Admission: EM | Admit: 2017-06-13 | Discharge: 2017-06-19 | DRG: 191 | Disposition: A | Discharge status: Home / Self Care | Payer: Medicare Other | Attending: Family Medicine | Admitting: Family Medicine

## 2017-06-13 DIAGNOSIS — F17210 Nicotine dependence, cigarettes, uncomplicated: Secondary | ICD-10-CM | POA: Diagnosis present

## 2017-06-13 DIAGNOSIS — Z7901 Long term (current) use of anticoagulants: Secondary | ICD-10-CM

## 2017-06-13 DIAGNOSIS — R946 Abnormal results of thyroid function studies: Secondary | ICD-10-CM | POA: Diagnosis not present

## 2017-06-13 DIAGNOSIS — Z79899 Other long term (current) drug therapy: Secondary | ICD-10-CM | POA: Diagnosis not present

## 2017-06-13 DIAGNOSIS — N179 Acute kidney failure, unspecified: Secondary | ICD-10-CM | POA: Diagnosis not present

## 2017-06-13 DIAGNOSIS — Z23 Encounter for immunization: Secondary | ICD-10-CM

## 2017-06-13 DIAGNOSIS — R7989 Other specified abnormal findings of blood chemistry: Secondary | ICD-10-CM | POA: Diagnosis present

## 2017-06-13 DIAGNOSIS — I480 Paroxysmal atrial fibrillation: Secondary | ICD-10-CM | POA: Diagnosis not present

## 2017-06-13 DIAGNOSIS — R0603 Acute respiratory distress: Secondary | ICD-10-CM | POA: Diagnosis not present

## 2017-06-13 DIAGNOSIS — E669 Obesity, unspecified: Secondary | ICD-10-CM | POA: Diagnosis present

## 2017-06-13 DIAGNOSIS — R062 Wheezing: Secondary | ICD-10-CM | POA: Diagnosis not present

## 2017-06-13 DIAGNOSIS — J449 Chronic obstructive pulmonary disease, unspecified: Secondary | ICD-10-CM | POA: Insufficient documentation

## 2017-06-13 DIAGNOSIS — T380X5A Adverse effect of glucocorticoids and synthetic analogues, initial encounter: Secondary | ICD-10-CM | POA: Diagnosis not present

## 2017-06-13 DIAGNOSIS — J441 Chronic obstructive pulmonary disease with (acute) exacerbation: Principal | ICD-10-CM | POA: Diagnosis present

## 2017-06-13 DIAGNOSIS — I10 Essential (primary) hypertension: Secondary | ICD-10-CM | POA: Diagnosis not present

## 2017-06-13 DIAGNOSIS — R0600 Dyspnea, unspecified: Secondary | ICD-10-CM

## 2017-06-13 DIAGNOSIS — R0602 Shortness of breath: Secondary | ICD-10-CM | POA: Diagnosis not present

## 2017-06-13 DIAGNOSIS — Z720 Tobacco use: Secondary | ICD-10-CM | POA: Diagnosis not present

## 2017-06-13 DIAGNOSIS — Z6836 Body mass index (BMI) 36.0-36.9, adult: Secondary | ICD-10-CM

## 2017-06-13 DIAGNOSIS — J410 Simple chronic bronchitis: Secondary | ICD-10-CM

## 2017-06-13 DIAGNOSIS — R000 Tachycardia, unspecified: Secondary | ICD-10-CM | POA: Diagnosis not present

## 2017-06-13 DIAGNOSIS — E875 Hyperkalemia: Secondary | ICD-10-CM | POA: Diagnosis not present

## 2017-06-13 DIAGNOSIS — R079 Chest pain, unspecified: Secondary | ICD-10-CM | POA: Insufficient documentation

## 2017-06-13 DIAGNOSIS — R05 Cough: Secondary | ICD-10-CM | POA: Diagnosis not present

## 2017-06-13 DIAGNOSIS — I4891 Unspecified atrial fibrillation: Secondary | ICD-10-CM | POA: Diagnosis not present

## 2017-06-13 DIAGNOSIS — R0609 Other forms of dyspnea: Secondary | ICD-10-CM | POA: Diagnosis present

## 2017-06-13 HISTORY — DX: Chronic obstructive pulmonary disease, unspecified: J44.9

## 2017-06-13 HISTORY — DX: Chronic obstructive pulmonary disease with (acute) exacerbation: J44.1

## 2017-06-13 HISTORY — DX: Unspecified atrial fibrillation: I48.91

## 2017-06-13 HISTORY — DX: Obesity, unspecified: E66.9

## 2017-06-13 HISTORY — DX: Tobacco use: Z72.0

## 2017-06-13 LAB — BASIC METABOLIC PANEL
Anion gap: 14 (ref 5–15)
BUN: 11 mg/dL (ref 6–20)
CALCIUM: 9.2 mg/dL (ref 8.9–10.3)
CO2: 21 mmol/L — ABNORMAL LOW (ref 22–32)
Chloride: 102 mmol/L (ref 101–111)
Creatinine, Ser: 1.02 mg/dL (ref 0.61–1.24)
GFR calc Af Amer: >60 mL/min (ref >60)
GLUCOSE: 123 mg/dL — AB (ref 65–99)
Potassium: 4.4 mmol/L (ref 3.5–5.1)
SODIUM: 137 mmol/L (ref 135–145)

## 2017-06-13 LAB — I-STAT ARTERIAL BLOOD GAS, ED
Acid-base deficit: 14 mmol/L — ABNORMAL HIGH (ref 0.0–2.0)
Bicarbonate: 12 mmol/L — ABNORMAL LOW (ref 20.0–28.0)
O2 SAT: 93 %
PCO2 ART: 27.8 mmHg — AB (ref 32.0–48.0)
PH ART: 7.242 — AB (ref 7.350–7.450)
PO2 ART: 75 mmHg — AB (ref 83.0–108.0)
TCO2: 13 mmol/L — AB (ref 22–32)

## 2017-06-13 LAB — GLUCOSE, CAPILLARY: GLUCOSE-CAPILLARY: 142 mg/dL — AB (ref 65–99)

## 2017-06-13 LAB — I-STAT TROPONIN, ED
TROPONIN I, POC: 0.01 ng/mL (ref 0.00–0.08)
Troponin i, poc: 0 ng/mL (ref 0.00–0.08)

## 2017-06-13 LAB — CBC WITH DIFFERENTIAL/PLATELET
Basophils Absolute: 0.1 10*3/uL (ref 0.0–0.1)
Basophils Relative: 1 %
EOS ABS: 0 10*3/uL (ref 0.0–0.7)
EOS PCT: 0 %
HCT: 45.5 % (ref 39.0–52.0)
Hemoglobin: 15.4 g/dL (ref 13.0–17.0)
LYMPHS ABS: 0.9 10*3/uL (ref 0.7–4.0)
LYMPHS PCT: 10 %
MCH: 29.7 pg (ref 26.0–34.0)
MCHC: 33.8 g/dL (ref 30.0–36.0)
MCV: 87.7 fL (ref 78.0–100.0)
MONO ABS: 0.9 10*3/uL (ref 0.1–1.0)
Monocytes Relative: 9 %
Neutro Abs: 7.7 10*3/uL (ref 1.7–7.7)
Neutrophils Relative %: 80 %
PLATELETS: 173 10*3/uL (ref 150–400)
RBC: 5.19 MIL/uL (ref 4.22–5.81)
RDW: 14.2 % (ref 11.5–15.5)
WBC: 9.6 10*3/uL (ref 4.0–10.5)

## 2017-06-13 LAB — BRAIN NATRIURETIC PEPTIDE: B Natriuretic Peptide: 76.4 pg/mL (ref 0.0–100.0)

## 2017-06-13 LAB — TSH: TSH: 0.207 u[IU]/mL — ABNORMAL LOW (ref 0.350–4.500)

## 2017-06-13 MED ORDER — IPRATROPIUM BROMIDE 0.02 % IN SOLN
0.5000 mg | Freq: Once | RESPIRATORY_TRACT | Status: AC
  Administered 2017-06-13: 0.5 mg via RESPIRATORY_TRACT
  Filled 2017-06-13: qty 2.5

## 2017-06-13 MED ORDER — AZITHROMYCIN 250 MG PO TABS
250.0000 mg | ORAL_TABLET | Freq: Every day | ORAL | Status: AC
  Administered 2017-06-14 – 2017-06-17 (×4): 250 mg via ORAL
  Filled 2017-06-13 (×4): qty 1

## 2017-06-13 MED ORDER — ORAL CARE MOUTH RINSE
15.0000 mL | Freq: Two times a day (BID) | OROMUCOSAL | Status: DC
  Administered 2017-06-14 – 2017-06-15 (×3): 15 mL via OROMUCOSAL

## 2017-06-13 MED ORDER — METOPROLOL TARTRATE 5 MG/5ML IV SOLN: 5.0000 mg | Freq: Once | INTRAVENOUS | Status: DC

## 2017-06-13 MED ORDER — LEVALBUTEROL HCL 0.63 MG/3ML IN NEBU
0.6300 mg | INHALATION_SOLUTION | Freq: Once | RESPIRATORY_TRACT | Status: AC
  Administered 2017-06-13: 0.63 mg via RESPIRATORY_TRACT
  Filled 2017-06-13: qty 3

## 2017-06-13 MED ORDER — CHLORHEXIDINE GLUCONATE 0.12 % MT SOLN
15.0000 mL | Freq: Two times a day (BID) | OROMUCOSAL | Status: DC
  Administered 2017-06-13 – 2017-06-19 (×11): 15 mL via OROMUCOSAL
  Filled 2017-06-13 (×10): qty 15

## 2017-06-13 MED ORDER — ACETAMINOPHEN 325 MG PO TABS: 650.0000 mg | ORAL_TABLET | ORAL | Status: DC | PRN

## 2017-06-13 MED ORDER — ALBUTEROL SULFATE (2.5 MG/3ML) 0.083% IN NEBU
5.0000 mg | INHALATION_SOLUTION | Freq: Once | RESPIRATORY_TRACT | Status: AC
  Administered 2017-06-13: 5 mg via RESPIRATORY_TRACT
  Filled 2017-06-13: qty 6

## 2017-06-13 MED ORDER — ALBUTEROL (5 MG/ML) CONTINUOUS INHALATION SOLN
10.0000 mg/h | INHALATION_SOLUTION | RESPIRATORY_TRACT | Status: DC
  Administered 2017-06-13: 10 mg/h via RESPIRATORY_TRACT
  Filled 2017-06-13: qty 20

## 2017-06-13 MED ORDER — METHYLPREDNISOLONE SODIUM SUCC 125 MG IJ SOLR
60.0000 mg | Freq: Four times a day (QID) | INTRAMUSCULAR | Status: DC
  Administered 2017-06-13: 60 mg via INTRAVENOUS
  Filled 2017-06-13: qty 2

## 2017-06-13 MED ORDER — HYDRALAZINE HCL 20 MG/ML IJ SOLN: 10.0000 mg | Freq: Once | INTRAMUSCULAR | Status: DC

## 2017-06-13 MED ORDER — HYDROCHLOROTHIAZIDE 25 MG PO TABS
25.0000 mg | ORAL_TABLET | Freq: Every day | ORAL | Status: DC
  Administered 2017-06-13: 25 mg via ORAL
  Filled 2017-06-13: qty 1

## 2017-06-13 MED ORDER — AMLODIPINE BESYLATE 5 MG PO TABS
5.0000 mg | ORAL_TABLET | Freq: Once | ORAL | Status: AC
  Administered 2017-06-13: 5 mg via ORAL
  Filled 2017-06-13: qty 1

## 2017-06-13 MED ORDER — PNEUMOCOCCAL VAC POLYVALENT 25 MCG/0.5ML IJ INJ
0.5000 mL | INJECTION | INTRAMUSCULAR | Status: AC
  Administered 2017-06-14: 0.5 mL via INTRAMUSCULAR
  Filled 2017-06-13: qty 0.5

## 2017-06-13 MED ORDER — RIVAROXABAN 20 MG PO TABS
20.0000 mg | ORAL_TABLET | Freq: Every day | ORAL | Status: DC
  Administered 2017-06-13 – 2017-06-18 (×6): 20 mg via ORAL
  Filled 2017-06-13 (×6): qty 1

## 2017-06-13 MED ORDER — AZITHROMYCIN 250 MG PO TABS
500.0000 mg | ORAL_TABLET | Freq: Every day | ORAL | Status: AC
  Administered 2017-06-13: 500 mg via ORAL
  Filled 2017-06-13: qty 2

## 2017-06-13 MED ORDER — LEVALBUTEROL HCL 0.63 MG/3ML IN NEBU
0.6300 mg | INHALATION_SOLUTION | Freq: Four times a day (QID) | RESPIRATORY_TRACT | Status: DC
  Administered 2017-06-14 – 2017-06-18 (×18): 0.63 mg via RESPIRATORY_TRACT
  Filled 2017-06-13 (×18): qty 3

## 2017-06-13 MED ORDER — ALBUTEROL (5 MG/ML) CONTINUOUS INHALATION SOLN
INHALATION_SOLUTION | RESPIRATORY_TRACT | Status: AC
  Filled 2017-06-13: qty 20

## 2017-06-13 MED ORDER — DILTIAZEM HCL 25 MG/5ML IV SOLN
5.0000 mg | Freq: Once | INTRAVENOUS | Status: AC
  Administered 2017-06-13: 5 mg via INTRAVENOUS
  Filled 2017-06-13: qty 5

## 2017-06-13 MED ORDER — IPRATROPIUM-ALBUTEROL 0.5-2.5 (3) MG/3ML IN SOLN: 3.0000 mL | Freq: Four times a day (QID) | RESPIRATORY_TRACT | Status: DC

## 2017-06-13 MED ORDER — PREDNISONE 20 MG PO TABS
50.0000 mg | ORAL_TABLET | Freq: Every day | ORAL | Status: DC
  Administered 2017-06-14 – 2017-06-15 (×2): 50 mg via ORAL
  Filled 2017-06-13 (×2): qty 1

## 2017-06-13 MED ORDER — DILTIAZEM HCL 30 MG PO TABS
30.0000 mg | ORAL_TABLET | Freq: Two times a day (BID) | ORAL | Status: DC
  Administered 2017-06-13 – 2017-06-19 (×13): 30 mg via ORAL
  Filled 2017-06-13 (×14): qty 1

## 2017-06-13 MED ORDER — LEVALBUTEROL HCL 0.63 MG/3ML IN NEBU: 0.6300 mg | INHALATION_SOLUTION | Freq: Four times a day (QID) | RESPIRATORY_TRACT | Status: DC

## 2017-06-13 MED ORDER — ONDANSETRON HCL 4 MG/2ML IJ SOLN: 4.0000 mg | Freq: Four times a day (QID) | INTRAMUSCULAR | Status: DC | PRN

## 2017-06-13 MED ORDER — METHYLPREDNISOLONE SODIUM SUCC 125 MG IJ SOLR
125.0000 mg | Freq: Once | INTRAMUSCULAR | Status: AC
  Administered 2017-06-13: 125 mg via INTRAVENOUS
  Filled 2017-06-13: qty 2

## 2017-06-13 MED ORDER — INFLUENZA VAC SPLIT HIGH-DOSE 0.5 ML IM SUSY
0.5000 mL | PREFILLED_SYRINGE | INTRAMUSCULAR | Status: AC
  Administered 2017-06-14: 0.5 mL via INTRAMUSCULAR
  Filled 2017-06-13: qty 0.5

## 2017-06-13 MED ORDER — LEVALBUTEROL HCL 0.63 MG/3ML IN NEBU
0.6300 mg | INHALATION_SOLUTION | RESPIRATORY_TRACT | Status: AC
  Administered 2017-06-13 (×4): 0.63 mg via RESPIRATORY_TRACT
  Filled 2017-06-13 (×4): qty 3

## 2017-06-13 MED ORDER — ALBUTEROL (5 MG/ML) CONTINUOUS INHALATION SOLN
15.0000 mg/h | INHALATION_SOLUTION | Freq: Once | RESPIRATORY_TRACT | Status: AC
  Administered 2017-06-13: 15 mg/h via RESPIRATORY_TRACT
  Filled 2017-06-13: qty 20

## 2017-06-13 NOTE — ED Notes (Signed)
Admitting paged in regards to Pt HR.

## 2017-06-13 NOTE — ED Notes (Signed)
Admitting at bedside 

## 2017-06-13 NOTE — ED Notes (Signed)
Pt did well walking low 93% with good wave and high was 95% with good wave from. Pt felt SOB when ambulating but not anymore then when he is laying in the bed.

## 2017-06-13 NOTE — ED Triage Notes (Signed)
Pt reports shortness of breath starting this morning, cough earlier this week with clear sputum.

## 2017-06-13 NOTE — ED Notes (Signed)
Called respiratory for ABG and neb.

## 2017-06-13 NOTE — ED Provider Notes (Signed)
Medical screening examination/treatment/procedure(s) were conducted as a shared visit with non-physician practitioner(s) and myself.  I personally evaluated the patient during the encounter.   EKG Interpretation  Date/Time:  Wednesday June 13 2017 05:56:01 EST Ventricular Rate:  92 PR Interval:    QRS Duration: 108 QT Interval:  379 QTC Calculation: 469 R Axis:   -72 Text Interpretation:  Sinus tachycardia Ventricular premature complex Incomplete RBBB and LAFB Low voltage, precordial leads Consider anterior infarct No old tracing to compare Confirmed by Rainey Kahrs, Cyril Mourning [phone]) on 06/13/2017 6:01:32 AM      Patient is a 72 year old male with history of tobacco use but no known history of COPD and asthma who presents emergency department shortness of breath.  Progressively worsening over the past few days.  Has had dry cough, sneezing, congestion.  No fever.  No chest pain or chest discomfort.  No history of CHF.  No sign of volume overload on exam.  Patient likely has viral URI exacerbating some of bronchospasm, possible underlying COPD.  No significant improvement after breathing treatments and now in atrial fibrillation with RVR.  Has history of the same.  Will be admitted to hospitalist service.   Sandy Blouch, Delice Bison, DO 06/13/17 2307

## 2017-06-13 NOTE — Progress Notes (Signed)
Pt taken off BIPAP at this time, placed on 4L HFNC. Pt tolerating at this time. RT will continue to monitor.

## 2017-06-13 NOTE — ED Provider Notes (Signed)
[facility] EMERGENCY DEPARTMENT Provider Note   CSN: [id number] Arrival date & time: 06/13/17  0548     History   Chief Complaint Chief Complaint  Patient presents with  . Shortness of Breath    HPI Derrick Alexander is a 72 y.o. male.  The history is provided by the patient and medical records. No language interpreter was used.  Shortness of Breath  Associated symptoms include cough and wheezing. Pertinent negatives include no fever, no sore throat, no chest pain and no leg swelling.   Derrick Alexander is a 72 y.o. male  with a PMH of HTN, a fib who presents to the Emergency Department complaining of shortness of breath. Patient states that he was having nasal congestion, sneezing and cough x 2 days. This morning, he woke up with shortness of breath. He feels as if he is not able to get a good deep breath. Shortness of breath is worse with exertion. He took a coricidin around 3 am. He has also been taking Tylenol over the last two days, but none today. He is on Xarelto for a.fib which he has been taking as directed with no missed doses. He has not taken home amlodipine or HCTZ in the last two days. Denies any leg swelling. Has not taken temperature. No fevers. Smokes about 3 cigarettes a day. Never been diagnosed with COPD or asthma.     Past Medical History:  Diagnosis Date  . A-fib ([place])   . Abnormal EKG 06/18/2015  . COPD (chronic obstructive pulmonary disease) ([employer])   . COPD with exacerbation ([place])   . Faintness 06/18/2015  . Hypertension   . LBBB (left bundle branch block) 06/18/2015  . Obesity   . Syncope 08/06/2015  . Tobacco use   . V-tach [facility]) 08/06/2015    Patient Active Problem List   Diagnosis Date Noted  . Dyspnea 06/13/2017  . Chest pain 06/13/2017  . A-fib ([place]) 06/13/2017  . Acute respiratory distress 06/13/2017  . COPD (chronic obstructive pulmonary disease) ([place])   . COPD with exacerbation ([place])   . Obesity   . Tobacco use   . Atrial  fibrillation ([place]) 08/10/2015  . V-tach ([place]) 08/06/2015  . Syncope 08/06/2015  . LBBB (left bundle branch block) 06/18/2015  . Faintness 06/18/2015  . Abnormal EKG 06/18/2015    History reviewed. No pertinent surgical history.     Home Medications    Prior to Admission medications   Medication Sig Start Date End Date Taking? Authorizing Provider  amLODipine (NORVASC) 5 MG tablet TAKE 1 TABLET BY MOUTH ONCE DAILY 01/24/17  Yes Camnitz, Ocie Doyne, MD  Chlorpheniramine-DM (CORICIDIN HBP COUGH/COLD PO) Take 1 tablet by mouth daily as needed (cold).   Yes [provider]  hydrochlorothiazide (HYDRODIURIL) 25 MG tablet take 1 tablet by mouth once daily 09/04/16  Yes Camnitz, Will Hassell Done, MD  lisinopril (PRINIVIL,ZESTRIL) 40 MG tablet TAKE 1 TABLET BY MOUTH ONCE DAILY 01/01/17  Yes Camnitz, Will Hassell Done, MD  nicotine (NICODERM CQ - DOSED IN MG/24 HOURS) 21 mg/24hr patch Place 21 mg onto the skin daily.   Yes [provider]  XARELTO 20 MG TABS tablet TAKE 1 TABLET BY MOUTH ONCE DAILY WITH SUPPER 05/29/17  Yes Camnitz, Ocie Doyne, MD    Family History Family History  Problem Relation Age of Onset  . Heart disease Brother   . COPD Brother   . Diabetes Mother     Social History Social History   Tobacco Use  .  Smoking status: Current Every Day Smoker    Start date: 05/23/1955  . Smokeless tobacco: Never Used  Substance Use Topics  . Alcohol use: No    Alcohol/week: 0.0 oz  . Drug use: No     Allergies   Patient has no known allergies.   Review of Systems Review of Systems  Constitutional: Positive for chills. Negative for fever.  HENT: Positive for congestion and sneezing. Negative for sore throat.   Respiratory: Positive for cough, shortness of breath and wheezing.   Cardiovascular: Negative for chest pain, palpitations and leg swelling.  All other systems reviewed and are negative.    Physical Exam Updated Vital Signs BP (!) 114/96   Pulse 74    Temp 98.2 F (36.8 C) (Oral)   Resp (!) 24   Ht 5\' 6"  (1.676 m)   Wt 103.4 kg (228 lb)   SpO2 97%   BMI 36.80 kg/m   Physical Exam  Constitutional: He is oriented to person, place, and time. He appears well-developed and well-nourished. No distress.  HENT:  Head: Normocephalic and atraumatic.  Cardiovascular: Normal rate, regular rhythm and normal heart sounds.  No murmur heard. Pulmonary/Chest: No respiratory distress.  Tachypneic. Wheezing throughout bilateral lung fields.   Abdominal: Soft. He exhibits no distension. There is no tenderness.  Musculoskeletal: He exhibits no edema.  Neurological: He is alert and oriented to person, place, and time.  Skin: Skin is warm and dry.  Nursing note and vitals reviewed.    ED Treatments / Results  Labs (all labs ordered are listed, but only abnormal results are displayed) Labs Reviewed  BASIC METABOLIC PANEL - Abnormal; Notable for the following components:      Result Value   CO2 21 (*)    Glucose, Bld 123 (*)    All other components within normal limits  CBC WITH DIFFERENTIAL/PLATELET  BRAIN NATRIURETIC PEPTIDE  TSH  I-STAT TROPONIN, ED  I-STAT TROPONIN, ED    EKG  EKG Interpretation  Date/Time:  Wednesday June 13 2017 10:30:38 EST Ventricular Rate:  139 PR Interval:    QRS Duration: 108 QT Interval:  339 QTC Calculation: 479 R Axis:   -76 Text Interpretation:  Atrial fibrillation Incomplete RBBB and LAFB Abnormal R-wave progression, late transition ST depression, probably rate related Borderline prolonged QT interval Confirmed by Pattricia Boss [phone]) on 06/13/2017 11:01:13 AM       Radiology Dg Chest Portable 1 View  Result Date: 06/13/2017 CLINICAL DATA:  Hypertension and shortness of breath for 1 day. EXAM: PORTABLE CHEST 1 VIEW COMPARISON:  06/15/2015 FINDINGS: Normal heart size and pulmonary vascularity. No focal airspace disease or consolidation in the lungs. No blunting of costophrenic angles. No  pneumothorax. Mediastinal contours appear intact. Old left rib fractures. Calcification of the aorta. IMPRESSION: No evidence of active pulmonary disease.  Aortic atherosclerosis. Electronically Signed   By: Lucienne Capers M.D.   On: 06/13/2017 06:24    Procedures Procedures (including critical care time)  CRITICAL CARE Performed by: Ozella Almond Kaisen Ackers   Total critical care time: 45 minutes  Critical care time was exclusive of separately billable procedures and treating other patients.  Critical care was necessary to treat or prevent imminent or life-threatening deterioration.  Critical care was time spent personally by me on the following activities: development of treatment plan with patient and/or surrogate as well as nursing, discussions with consultants, evaluation of patient's response to treatment, examination of patient, obtaining history from patient or surrogate, ordering and performing treatments  and interventions, ordering and review of laboratory studies, ordering and review of radiographic studies, pulse oximetry and re-evaluation of patient's condition.   Medications Ordered in ED Medications  rivaroxaban (XARELTO) tablet 20 mg (not administered)  acetaminophen (TYLENOL) tablet 650 mg (not administered)  ondansetron (ZOFRAN) injection 4 mg (not administered)  diltiazem (CARDIZEM) tablet 30 mg (not administered)  albuterol (PROVENTIL,VENTOLIN) solution continuous neb (10 mg/hr Nebulization New Bag/Given 06/13/17 1231)  azithromycin (ZITHROMAX) tablet 500 mg (not administered)    Followed by  azithromycin (ZITHROMAX) tablet 250 mg (not administered)  predniSONE (DELTASONE) tablet 50 mg (not administered)  levalbuterol (XOPENEX) nebulizer solution 0.63 mg (not administered)  ipratropium (ATROVENT) nebulizer solution 0.5 mg (not administered)  levalbuterol (XOPENEX) nebulizer solution 0.63 mg (not administered)  levalbuterol (XOPENEX) nebulizer solution 0.63 mg (not  administered)  levalbuterol (XOPENEX) nebulizer solution 0.63 mg (not administered)  albuterol (PROVENTIL) (2.5 MG/3ML) 0.083% nebulizer solution 5 mg (5 mg Nebulization Given 06/13/17 0610)  methylPREDNISolone sodium succinate (SOLU-MEDROL) 125 mg/2 mL injection 125 mg (125 mg Intravenous Given 06/13/17 5009)  amLODipine (NORVASC) tablet 5 mg (5 mg Oral Given 06/13/17 0630)  albuterol (PROVENTIL,VENTOLIN) solution continuous neb (15 mg/hr Nebulization Given 06/13/17 0758)  diltiazem (CARDIZEM) injection 5 mg (5 mg Intravenous Given 06/13/17 1227)     Initial Impression / Assessment and Plan / ED Course  I have reviewed the triage vital signs and the nursing notes.  Pertinent labs & imaging results that were available during my care of the patient were reviewed by me and considered in my medical decision making (see chart for details).    Derrick Alexander is a 72 y.o. male who presents to ED for shortness of breath which began this morning. He did have a day or two of URI symptoms preceding this. Upon arrival, afebrile. BP elevated - has not taken home meds- home meds given. HR in the 80's-90's sinus rhythm. Lungs with wheezing throughout all lung fields. He appears tachypneic. Duobneb and steroids given with minimal improvement. Started on continuous neb.   CXR negative. BNP wdl. Troponin wdl.  Patient re-evaluated following nebulizer treatment. He states that he feels somewhat better, but still having shortness of breath. He ambulated in hallway and was able to maintain O2 > 93%. When he arrived back in the room, he appeared in moderate amount of distress and RR 25-30. He then complained his left arm was bothering him which was new. Repeat troponin negative. Repeat EKG now in a fib with rate in 130's. Patient does have hx of paroxysmal a.fib. Hospitalist consulted who will admit for further work up and management.   Patient seen by and discussed with Dr. Leonides Schanz who agrees with treatment plan.    Final  Clinical Impressions(s) / ED Diagnoses   Final diagnoses:  Simple chronic bronchitis ([place])  COPD with exacerbation [facility])  Tobacco use    ED Discharge Orders    None       Bryli Mantey, Ozella Almond, [place] 06/13/17 1349

## 2017-06-13 NOTE — H&P (Signed)
History and Physical    Derrick Alexander ION:[id number] DOB: [birth year] DOA: 06/13/2017  PCP: [name], MD Patient coming from: home  Chief Complaint: sob/cough  HPI: Derrick Alexander is a 72 y.o. male with medical history significant COPD not on home oxygen continues to smoke, hypertension, paroxysmal A. fib on Xarelto, obesity, since emergency Department chief complaint worsening persistent shortness of breath. Initial evaluation reveals acute respiratory distress likely related to COPD exacerbation and atrial fibrillation with rapid ventricular response  Formation is obtained from the patient and his wife is at the bedside. Wife reports has been contact cold about a week and a half ago. Congestion in his head travel to his chest. At that time he has developed persistent worsening shortness of breath cough increased sputum production. He denies chest pain but does admit to some intermittent palpitations. He denies headache dizziness syncope or near-syncope. He denies fever chills nausea vomiting. He does admit to orthopnea. denies any lower extremity edema but admits to orthopnea. He denies dysuria hematuria frequency or urgency. He denies diarrhea constipation melena bright red blood per rectum.    ED Course: In emergency department he is in obvious respiratory distress but not hypoxic. Has tachypnea. He is provided with continuous nebs and Solu-Medrol. His heart rate went to 140 EKG revealed A. fib with rapid ventricular response.   Review of Systems: As per HPI otherwise all other systems reviewed and are negative.   Ambulatory Status: Ambulates independently is independent with ADLs  Past Medical History:  Diagnosis Date  . A-fib ([place])   . Abnormal EKG 06/18/2015  . COPD (chronic obstructive pulmonary disease) ([place])   . COPD with exacerbation ([place])   . Faintness 06/18/2015  . Hypertension   . LBBB (left bundle branch block) 06/18/2015  . Obesity   . Syncope 08/06/2015  . Tobacco use     . V-tach ([place]) 08/06/2015    History reviewed. No pertinent surgical history.  Social History   Socioeconomic History  . Marital status: Married    Spouse name: Not on file  . Number of children: Not on file  . Years of education: Not on file  . Highest education level: Not on file  Social Needs  . Financial resource strain: Not on file  . Food insecurity - worry: Not on file  . Food insecurity - inability: Not on file  . Transportation needs - medical: Not on file  . Transportation needs - non-medical: Not on file  Occupational History  . Not on file  Tobacco Use  . Smoking status: Current Every Day Smoker    Start date: 05/23/1955  . Smokeless tobacco: Never Used  Substance and Sexual Activity  . Alcohol use: No    Alcohol/week: 0.0 oz  . Drug use: No  . Sexual activity: Not on file  Other Topics Concern  . Not on file  Social History Narrative  . Not on file    No Known Allergies  Family History  Problem Relation Age of Onset  . Heart disease Brother   . COPD Brother   . Diabetes Mother     Prior to Admission medications   Medication Sig Start Date End Date Taking? Authorizing Provider  amLODipine (NORVASC) 5 MG tablet TAKE 1 TABLET BY MOUTH ONCE DAILY 01/24/17  Yes Camnitz, Ocie Doyne, MD  Chlorpheniramine-DM (CORICIDIN HBP COUGH/COLD PO) Take 1 tablet by mouth daily as needed (cold).   Yes [provider]  hydrochlorothiazide (HYDRODIURIL) 25 MG tablet take 1 tablet by  mouth once daily 09/04/16  Yes Camnitz, Will Hassell Done, MD  lisinopril (PRINIVIL,ZESTRIL) 40 MG tablet TAKE 1 TABLET BY MOUTH ONCE DAILY 01/01/17  Yes Camnitz, Will Hassell Done, MD  nicotine (NICODERM CQ - DOSED IN MG/24 HOURS) 21 mg/24hr patch Place 21 mg onto the skin daily.   Yes [provider]  XARELTO 20 MG TABS tablet TAKE 1 TABLET BY MOUTH ONCE DAILY WITH SUPPER 05/29/17  Yes Constance Haw, MD    Physical Exam: Vitals:   06/13/17 1030 06/13/17 1045 06/13/17 1115 06/13/17  1200  BP: 120/76 136/73 128/85 134/86  Pulse: (!) 101 (!) 129 (!) 103 97  Resp: 18 (!) 27 (!) 24 (!) 23  Temp:      TempSrc:      SpO2: 93% 94% 94% 95%  Weight:      Height:         General:  Appears somewhat anxious obese in moderate distress Eyes:  PERRL, EOMI, normal lids, iris ENT:  grossly normal hearing, lips & tongue, because membranes of his mouth are moist and pink Neck:  no LAD, masses or thyromegaly Cardiovascular:  Regularly irregular hear no murmur gallop or rub heart sounds are distant. No lower extremity edema Respiratory:  Moderate to severe increased work of breathing particularly with conversation and movement. Breath sounds are quite diminished throughout a do hear an end expiratory wheeze no crackles Abdomen:  soft, ntnd, obese positive bowel sounds no guarding or rebounding Skin:  no rash or induration seen on limited exam Musculoskeletal:  grossly normal tone BUE/BLE, good ROM, no bony abnormality Psychiatric:  grossly normal mood and affect, speech fluent and appropriate, AOx3 Neurologic:  CN 2-12 grossly intact, moves all extremities in coordinated fashion, sensation intact  Labs on Admission: I have personally reviewed following labs and imaging studies  CBC: Recent Labs  Lab 06/13/17 0603  WBC 9.6  NEUTROABS 7.7  HGB 15.4  HCT 45.5  MCV 87.7  PLT 244   Basic Metabolic Panel: Recent Labs  Lab 06/13/17 0603  NA 137  K 4.4  CL 102  CO2 21*  GLUCOSE 123*  BUN 11  CREATININE 1.02  CALCIUM 9.2   GFR: Estimated Creatinine Clearance: 73.7 mL/min (by C-G formula based on SCr of 1.02 mg/dL). Liver Function Tests: No results for input(s): AST, ALT, ALKPHOS, BILITOT, PROT, ALBUMIN in the last 168 hours. No results for input(s): LIPASE, AMYLASE in the last 168 hours. No results for input(s): AMMONIA in the last 168 hours. Coagulation Profile: No results for input(s): INR, PROTIME in the last 168 hours. Cardiac Enzymes: No results for input(s):  CKTOTAL, CKMB, CKMBINDEX, TROPONINI in the last 168 hours. BNP (last 3 results) No results for input(s): PROBNP in the last 8760 hours. HbA1C: No results for input(s): HGBA1C in the last 72 hours. CBG: No results for input(s): GLUCAP in the last 168 hours. Lipid Profile: No results for input(s): CHOL, HDL, LDLCALC, TRIG, CHOLHDL, LDLDIRECT in the last 72 hours. Thyroid Function Tests: No results for input(s): TSH, T4TOTAL, FREET4, T3FREE, THYROIDAB in the last 72 hours. Anemia Panel: No results for input(s): VITAMINB12, FOLATE, FERRITIN, TIBC, IRON, RETICCTPCT in the last 72 hours. Urine analysis: No results found for: COLORURINE, APPEARANCEUR, LABSPEC, PHURINE, GLUCOSEU, HGBUR, BILIRUBINUR, KETONESUR, PROTEINUR, UROBILINOGEN, NITRITE, LEUKOCYTESUR  Creatinine Clearance: Estimated Creatinine Clearance: 73.7 mL/min (by C-G formula based on SCr of 1.02 mg/dL).  Sepsis Labs: @LABRCNTIP (procalcitonin:4,lacticidven:4) )No results found for this or any previous visit (from the past 240 hour(s)).   Radiological  Exams on Admission: Dg Chest Portable 1 View  Result Date: 06/13/2017 CLINICAL DATA:  Hypertension and shortness of breath for 1 day. EXAM: PORTABLE CHEST 1 VIEW COMPARISON:  06/15/2015 FINDINGS: Normal heart size and pulmonary vascularity. No focal airspace disease or consolidation in the lungs. No blunting of costophrenic angles. No pneumothorax. Mediastinal contours appear intact. Old left rib fractures. Calcification of the aorta. IMPRESSION: No evidence of active pulmonary disease.  Aortic atherosclerosis. Electronically Signed   By: Lucienne Capers M.D.   On: 06/13/2017 06:24    EKG:  Atrial fibrillation Incomplete RBBB and LAFB Abnormal R-wave progression, late transition ST depression, probably rate related Borderline prolonged QT interval  Assessment/Plan Principal Problem:   Acute respiratory distress Active Problems:   Atrial fibrillation (HCC)   Dyspnea   A-fib  (HCC)   COPD with exacerbation (HCC)   Obesity   Tobacco use   #1. Acute respiratory distress likely related to COPD exacerbation and atrial fibrillation with rapid ventricular response. Chest x-ray notes no evidence of active pulmonary disease. He does demonstrate some tachypnea all about wheezing quite diminished breath sounds on exam. BNP is within the limits of normal. He is provided with the continuous nebs 125 mg of Solu-Medrol in the emergency department at time of admission he remains in moderate to severe respiratory distress. He is not hypoxic -Admit to telemetry -Stat nebulizer -prednisone daily -cardizem 5 mg IV as well as 30 mg by mouth twice a day -Flutter valve -monitor closely as condition guarded  #2. COPD with exacerbation. Patient denies ever having had an exacerbation before. Never been intubated. Does not have a pulmonologist. Is not on home oxygen or home inhalers or nebulizers. He's been smoking most of his life. Oxygen saturation level 94% on room air. Patient has quite an increased work of breathing -Stat nebulizer -Continue Solu-Medrol -Flutter valve -z pack -monitor -Would likely benefit from pulmonary function tests done outpatient  3. Atrial fibrillation with rapid ventricular response. xgSCax axiew 4,  Heart rate range 110 and 138. She'll EKG sinus rhythm. After his nebulizer EKG with atrial fibrillation. He does have a history of paroxysmal A. fib. He is on Xarelto. Home medications include Norvasc, lisinopril, hydrochlorothiazide. Chart review indicates history of bradycardia and syncope. He wore a heart monitor that showed no obvious causes for syncope but does show a 16 beat run of nonsustained V. tach according to cardiology note. Corene to same note no need for weight control seems asymptomatic -Cardizem IV x1 -cardizem 30mg  by mouth twice a day -TSH -Hold Norvasc lisinopril and hydrochlorothiazide for now -Monitor closely -Resume as home meds as  indicated -may need low dose rate controlled med at discharge  4. Hypertension. Good control emergency department. Home medications as noted above -In Cardizem due to #3. -Old lisinopril and Norvasc for now  #5. Obesity. BMI 36.8  -Nutritional consult    #6. Tobacco use -cessation counseling offered   DVT prophylaxis: xarelto  Code Status: full  Family Communication: wife and daughter at bedside  Disposition Plan: home when ready  Consults called: none  Admission status: inpatient    Radene Gunning MD [facility]  If 7PM-7AM, please contact night-coverage [email or web address] Password [facility]  06/13/2017, 12:22 PM  \

## 2017-06-13 NOTE — ED Notes (Signed)
Attempted Report 

## 2017-06-13 NOTE — Progress Notes (Signed)
Transported pt from ED D35 to 6E15 on bipap. Pt stable throughout with no complications. RT called report to receiving RT.

## 2017-06-13 NOTE — ED Notes (Signed)
ED Provider at bedside. 

## 2017-06-14 DIAGNOSIS — I10 Essential (primary) hypertension: Secondary | ICD-10-CM | POA: Diagnosis present

## 2017-06-14 DIAGNOSIS — R7989 Other specified abnormal findings of blood chemistry: Secondary | ICD-10-CM | POA: Diagnosis present

## 2017-06-14 LAB — CBC
HCT: 43.2 % (ref 39.0–52.0)
Hemoglobin: 14.2 g/dL (ref 13.0–17.0)
MCH: 29.3 pg (ref 26.0–34.0)
MCHC: 32.9 g/dL (ref 30.0–36.0)
MCV: 89.1 fL (ref 78.0–100.0)
PLATELETS: 183 10*3/uL (ref 150–400)
RBC: 4.85 MIL/uL (ref 4.22–5.81)
RDW: 14.8 % (ref 11.5–15.5)
WBC: 17.4 10*3/uL — ABNORMAL HIGH (ref 4.0–10.5)

## 2017-06-14 LAB — MRSA PCR SCREENING: MRSA BY PCR: NEGATIVE

## 2017-06-14 LAB — BASIC METABOLIC PANEL
Anion gap: 13 (ref 5–15)
BUN: 32 mg/dL — ABNORMAL HIGH (ref 6–20)
CALCIUM: 9.3 mg/dL (ref 8.9–10.3)
CO2: 22 mmol/L (ref 22–32)
CREATININE: 1.76 mg/dL — AB (ref 0.61–1.24)
Chloride: 102 mmol/L (ref 101–111)
GFR calc Af Amer: 43 mL/min — ABNORMAL LOW (ref >60)
GFR, EST NON AFRICAN AMERICAN: 37 mL/min — AB (ref >60)
GLUCOSE: 151 mg/dL — AB (ref 65–99)
Potassium: 5.4 mmol/L — ABNORMAL HIGH (ref 3.5–5.1)
Sodium: 137 mmol/L (ref 135–145)

## 2017-06-14 LAB — T4, FREE: FREE T4: 0.87 ng/dL (ref 0.61–1.12)

## 2017-06-14 MED ORDER — HYDROCODONE-ACETAMINOPHEN 5-325 MG PO TABS: 1.0000 | ORAL_TABLET | ORAL | Status: DC | PRN

## 2017-06-14 NOTE — Progress Notes (Signed)
Called respiratory to put pt on BIPAP

## 2017-06-14 NOTE — Discharge Instructions (Addendum)
Smoking Tobacco Information Smoking tobacco will very likely harm your health. Tobacco contains a poisonous (toxic), colorless chemical called nicotine. Nicotine affects the brain and makes tobacco addictive. This change in your brain can make it hard to stop smoking. Tobacco also has other toxic chemicals that can hurt your body and raise your risk of many cancers. How can smoking tobacco affect me? Smoking tobacco can increase your chances of having serious health conditions, such as:  Cancer. Smoking is most commonly associated with lung cancer, but can lead to cancer in other parts of the body.  Chronic obstructive pulmonary disease (COPD). This is a long-term lung condition that makes it hard to breathe. It also gets worse over time.  High blood pressure (hypertension), heart disease, stroke, or heart attack.  Lung infections, such as pneumonia.  Cataracts. This is when the lenses in the eyes become clouded.  Digestive problems. This may include peptic ulcers, heartburn, and gastroesophageal reflux disease (GERD).  Oral health problems, such as gum disease and tooth loss.  Loss of taste and smell.  Smoking can affect your appearance by causing:  Wrinkles.  Yellow or stained teeth, fingers, and fingernails.  Smoking tobacco can also affect your social life.  Many workplaces, [employer], hotels, and public places are tobacco-free. This means that you may experience challenges in finding places to smoke when away from home.  The cost of a smoking habit can be expensive. Expenses for someone who smokes come in two ways: ? You spend money on a regular basis to buy tobacco. ? Your health care costs in the long-term are higher if you smoke.  Tobacco smoke can also affect the health of those around you. Children of smokers have greater chances of: ? Sudden infant death syndrome (SIDS). ? Ear infections. ? Lung infections.  What lifestyle changes can be made?  Do not start  smoking. Quit if you already do.  To quit smoking: ? Make a plan to quit smoking and commit yourself to it. Look for programs to help you and ask your health care provider for recommendations and ideas. ? Talk with your health care provider about using nicotine replacement medicines to help you quit. Medicine replacement medicines include gum, lozenges, patches, sprays, or pills. ? Do not replace cigarette smoking with electronic cigarettes, which are commonly called e-cigarettes. The safety of e-cigarettes is not known, and some may contain harmful chemicals. ? Avoid places, people, or situations that tempt you to smoke. ? If you try to quit but return to smoking, don't give up hope. It is very common for people to try a number of times before they fully succeed. When you feel ready again, give it another try.  Quitting smoking might affect the way you eat as well as your weight. Be prepared to monitor your eating habits. Get support in planning and following a healthy diet.  Ask your health care provider about having regular tests (screenings) to check for cancer. This may include blood tests, imaging tests, and other tests.  Exercise regularly. Consider taking walks, joining a gym, or doing yoga or exercise classes.  Develop skills to manage your stress. These skills include meditation. What are the benefits of quitting smoking? By quitting smoking, you may:  Lower your risk of getting cancer and other diseases caused by smoking.  Live longer.  Breathe better.  Lower your blood pressure and heart rate.  Stop your addiction to tobacco.  Stop creating secondhand smoke that hurts other people.  Improve your  sense of taste and smell.  Look better over time, due to having fewer wrinkles and less staining.  What can happen if changes are not made? If you do not stop smoking, you may:  Get cancer and other diseases.  Develop COPD or other long-term (chronic) lung  conditions.  Develop serious problems with your heart and blood vessels (cardiovascular system).  Need more tests to screen for problems caused by smoking.  Have higher, long-term healthcare costs from medicines or treatments related to smoking.  Continue to have worsening changes in your lungs, mouth, and nose.  Where to find support: To get support to quit smoking, consider:  Asking your health care provider for more information and resources.  Taking classes to learn more about quitting smoking.  Looking for local organizations that offer resources about quitting smoking.  Joining a support group for people who want to quit smoking in your local community.  Where to find more information: You may find more information about quitting smoking from:  HelpGuide.org: www.helpguide.org/articles/addictions/how-to-quit-smoking.htm  https://hall.com/: smokefree.gov  American Lung Association: www.lung.org  Contact a health care provider if:  You have problems breathing.  Your lips, nose, or fingers turn blue.  You have chest pain.  You are coughing up blood.  You feel faint or you pass out.  You have other noticeable changes that cause you to worry. Summary  Smoking tobacco can negatively affect your health, the health of those around you, your finances, and your social life.  Do not start smoking. Quit if you already do. If you need help quitting, ask your health care provider.  Think about joining a support group for people who want to quit smoking in your local community. There are many effective programs that will help you to quit this behavior. This information is not intended to replace advice given to you by your health care provider. Make sure you discuss any questions you have with your health care provider. Document Released: 05/23/2016 Document Revised: 05/23/2016 Document Reviewed: 05/23/2016 Elsevier Interactive Patient Education  2018 [place] on my medicine - XARELTO (Rivaroxaban)  Why was Xarelto prescribed for you? Xarelto was prescribed for you to reduce the risk of a blood clot forming that can cause a stroke if you have a medical condition called atrial fibrillation (a type of irregular heartbeat).  What do you need to know about xarelto ? Take your Xarelto ONCE DAILY at the same time every day with your evening meal. If you have difficulty swallowing the tablet whole, you may crush it and mix in applesauce just prior to taking your dose.  Take Xarelto exactly as prescribed by your doctor and DO NOT stop taking Xarelto without talking to the doctor who prescribed the medication.  Stopping without other stroke prevention medication to take the place of Xarelto may increase your risk of developing a clot that causes a stroke.  Refill your prescription before you run out.  After discharge, you should have regular check-up appointments with your healthcare provider that is prescribing your Xarelto.  In the future your dose may need to be changed if your kidney function or weight changes by a significant amount.  What do you do if you miss a dose? If you are taking Xarelto ONCE DAILY and you miss a dose, take it as soon as you remember on the same day then continue your regularly scheduled once daily regimen the next day. Do not take two doses of Xarelto at the  same time or on the same day.   Important Safety Information A possible side effect of Xarelto is bleeding. You should call your healthcare provider right away if you experience any of the following: ? Bleeding from an injury or your nose that does not stop. ? Unusual colored urine (red or dark brown) or unusual colored stools (red or black). ? Unusual bruising for unknown reasons. ? A serious fall or if you hit your head (even if there is no bleeding).  Some medicines may interact with Xarelto and might increase your risk of bleeding  while on Xarelto. To help avoid this, consult your healthcare provider or pharmacist prior to using any new prescription or non-prescription medications, including herbals, vitamins, non-steroidal anti-inflammatory drugs (NSAIDs) and supplements.  This website has more information on Xarelto: https://guerra-benson.com/.    Atrial Fibrillation Atrial fibrillation is a type of heartbeat that is irregular or fast (rapid). If you have this condition, your heart keeps quivering in a weird (chaotic) way. This condition can make it so your heart cannot pump blood normally. Having this condition gives a person more risk for stroke, heart failure, and other heart problems. There are different types of atrial fibrillation. Talk with your doctor to learn about the type that you have. Follow these instructions at home:  Take over-the-counter and prescription medicines only as told by your doctor.  If your doctor prescribed a blood-thinning medicine, take it exactly as told. Taking too much of it can cause bleeding. If you do not take enough of it, you will not have the protection that you need against stroke and other problems.  Do not use any tobacco products. These include cigarettes, chewing tobacco, and e-cigarettes. If you need help quitting, ask your doctor.  If you have apnea (obstructive sleep apnea), manage it as told by your doctor.  Do not drink alcohol.  Do not drink beverages that have caffeine. These include coffee, soda, and tea.  Maintain a healthy weight. Do not use diet pills unless your doctor says they are safe for you. Diet pills may make heart problems worse.  Follow diet instructions as told by your doctor.  Exercise regularly as told by your doctor.  Keep all follow-up visits as told by your doctor. This is important. Contact a doctor if:  You notice a change in the speed, rhythm, or strength of your heartbeat.  You are taking a blood-thinning medicine and you notice more  bruising.  You get tired more easily when you move or exercise. Get help right away if:  You have pain in your chest or your belly (abdomen).  You have sweating or weakness.  You feel sick to your stomach (nauseous).  You notice blood in your throw up (vomit), poop (stool), or pee (urine).  You are short of breath.  You suddenly have swollen feet and ankles.  You feel dizzy.  Your suddenly get weak or numb in your face, arms, or legs, especially if it happens on one side of your body.  You have trouble talking, trouble understanding, or both.  Your face or your eyelid droops on one side. These symptoms may be an emergency. Do not wait to see if the symptoms will go away. Get medical help right away. Call your local emergency services (911 in the [place] Do not drive yourself to the hospital. This information is not intended to replace advice given to you by your health care provider. Make sure you discuss any questions you have with your  health care provider. Document Released: 02/15/2008 Document Revised: 10/14/2015 Document Reviewed: 09/02/2014 Elsevier Interactive Patient Education  [employer].

## 2017-06-14 NOTE — Progress Notes (Signed)
Pt sleeping at time of RT arrival for 0200 neb tx. Pt appears to be sleeping comfortably on RA, SpO2 93%. Pt still with bilateral exp wheeze, but does not sound as congested as previously. Pt states he is feeling good at this time. RT will continue to monitor.

## 2017-06-14 NOTE — Progress Notes (Signed)
Progress Note    Kannan Proia  AJO:[id number] DOB: 22-Jan-1946  DOA: 06/13/2017 PCP: [name], MD    Brief Narrative:   Chief complaint: Follow-up shortness of breath  Medical records reviewed and are as summarized below:  Derrick Alexander is an 72 y.o. male with a PMH of COPD, ongoing tobacco abuse, hypertension, PAF on Xarelto, and obesity who was admitted 06/13/17 for evaluation of progressive shortness of breath associated with cough and increased sputum production. Upon initial evaluation, he was found to be in respiratory distress with rapid A. fib. He was treated with continuous nebulizer treatments and Solu-Medrol. His respiratory status deteriorated and he required BiPAP.  Assessment/Plan:   Principal Problem:   COPD with exacerbation/Acute respiratory distress No obvious pneumonia on chest x-ray. BNP WNL. Continue bronchodilators, azithromycin, prednisone, flutter valve. Continue BiPAP as needed and wean as tolerated. Has been off Bipap since last evening.   Active Problems:   Paroxysmal atrial fibrillation with RVR (HCC)/H/O NSVT and syncope Follows with Dr. Curt Bears Q 6 months. CHA2DS2-VASc Score and unadjusted Ischemic Stroke Rate (% per year) is equal to 4.8 % stroke rate/year from a score of 4. He is already being treated with Xarelto. Heart rate currently controlled. Currently on Cardizem 30 mg every 12 hours.    Tobacco use Tobacco cessation counseling per nursing.      Obesity Body mass index is 35.9 kg/m.      Low TSH level Free T4 is WNL.    Hypertension Blood pressure well controlled on Norvasc, hydrochlorothiazide, and lisinopril.  Family Communication/Anticipated D/C date and plan/Code Status   DVT prophylaxis: Xarelto ordered. Code Status: Full Code.  Family Communication: Wife at bedside. Disposition Plan: Home in 24-48 hours depending on rate of improvement in respiratory status.   Medical Consultants:    None.   Anti-Infectives:     Azithromycin 06/13/17--->  Subjective:   Mr. Hisaw is still short of breath, and reports a cough mostly productive of clear-yellow sputum. No current reports of chest pain.   Objective:    Vitals:   06/13/17 1947 06/13/17 2138 06/14/17 0035 06/14/17 0505  BP: (!) 110/55 (!) 118/53 (!) 93/53 111/78  Pulse:  93 88 73  Resp:  (!) 22 15 17   Temp:  98.1 F (36.7 C) 98.4 F (36.9 C) 98.2 F (36.8 C)  TempSrc:  Oral Oral Oral  SpO2: 95% 95% 95% 94%  Weight:    100.9 kg (222 lb 6.4 oz)  Height:        Intake/Output Summary (Last 24 hours) at 06/14/2017 0802 Last data filed at 06/13/2017 9470 Gross per 24 hour  Intake 0 ml  Output -  Net 0 ml   Filed Weights   06/13/17 0559 06/14/17 0505  Weight: 103.4 kg (228 lb) 100.9 kg (222 lb 6.4 oz)    Exam: General: Obese male in mild distress from shortness of breath. Cardiovascular: Heart sounds irregular. No gallops or rubs. No murmurs. No JVD. Lungs: Course bilaterally with scattered rhonchi and diminished throughout with scattered wheezes. Abdomen: Soft, nontender, nondistended with normal active bowel sounds. No masses. No hepatosplenomegaly. Neurological: Alert and oriented 3. Moves all extremities 4 with equal strength. Cranial nerves II through XII grossly intact. Skin: Warm and dry. No rashes or lesions. Extremities: No clubbing or cyanosis. 1+ edema. Pedal pulses 2+. Psychiatric: Mood and affect are normal. Insight and judgment are fair.   Data Reviewed:   I have personally reviewed following labs and imaging studies:  Labs: Labs show the following:   Basic Metabolic Panel: Recent Labs  Lab 06/13/17 0603  NA 137  K 4.4  CL 102  CO2 21*  GLUCOSE 123*  BUN 11  CREATININE 1.02  CALCIUM 9.2   GFR Estimated Creatinine Clearance: 72.8 mL/min (by C-G formula based on SCr of 1.02 mg/dL).  CBC: Recent Labs  Lab 06/13/17 0603  WBC 9.6  NEUTROABS 7.7  HGB 15.4  HCT 45.5  MCV 87.7  PLT 173    CBG: Recent Labs  Lab 06/13/17 2136  GLUCAP 142*   Thyroid function studies: Recent Labs    06/13/17 1231  TSH 0.207*    Microbiology Recent Results (from the past 240 hour(s))  MRSA PCR Screening     Status: None   Collection Time: 06/14/17  6:01 AM  Result Value Ref Range Status   MRSA by PCR NEGATIVE NEGATIVE Final    Comment:        The GeneXpert MRSA Assay (FDA approved for NASAL specimens only), is one component of a comprehensive MRSA colonization surveillance program. It is not intended to diagnose MRSA infection nor to guide or monitor treatment for MRSA infections.     Procedures and diagnostic studies:  Dg Chest Portable 1 View: 06/13/2017: My independent review of the image shows: Cardiomegaly. No pneumonia.     Medications:   . azithromycin  250 mg Oral Daily  . chlorhexidine  15 mL Mouth Rinse BID  . diltiazem  30 mg Oral Q12H  . Influenza vac split quadrivalent PF  0.5 mL Intramuscular Tomorrow-1000  . levalbuterol  0.63 mg Nebulization Q6H  . mouth rinse  15 mL Mouth Rinse q12n4p  . pneumococcal 23 valent vaccine  0.5 mL Intramuscular Tomorrow-1000  . predniSONE  50 mg Oral Q breakfast  . rivaroxaban  20 mg Oral Q supper   Continuous Infusions: . albuterol 10 mg/hr (06/13/17 1231)     LOS: 1 day   Jacquelynn Cree  [facility] Pager [phone]. If unable to reach me by pager, please call my cell phone at [phone].  *Please refer to [email or web address], password [email or web address] to get updated schedule on who will round on this patient, as hospitalists switch teams weekly. If 7PM-7AM, please contact night-coverage at [email or web address], password [email or web address] for any overnight needs.  06/14/2017, 8:02 AM

## 2017-06-15 LAB — BASIC METABOLIC PANEL
Anion gap: 7 (ref 5–15)
BUN: 34 mg/dL — AB (ref 6–20)
CO2: 29 mmol/L (ref 22–32)
CREATININE: 1.08 mg/dL (ref 0.61–1.24)
Calcium: 9.5 mg/dL (ref 8.9–10.3)
Chloride: 103 mmol/L (ref 101–111)
GFR calc Af Amer: >60 mL/min (ref >60)
GLUCOSE: 108 mg/dL — AB (ref 65–99)
POTASSIUM: 5.4 mmol/L — AB (ref 3.5–5.1)
Sodium: 139 mmol/L (ref 135–145)

## 2017-06-15 MED ORDER — GUAIFENESIN ER 600 MG PO TB12
600.0000 mg | ORAL_TABLET | Freq: Two times a day (BID) | ORAL | Status: DC
  Administered 2017-06-15 – 2017-06-19 (×9): 600 mg via ORAL
  Filled 2017-06-15 (×9): qty 1

## 2017-06-15 MED ORDER — SODIUM POLYSTYRENE SULFONATE 15 GM/60ML PO SUSP
30.0000 g | Freq: Once | ORAL | Status: AC
  Administered 2017-06-15: 30 g via ORAL
  Filled 2017-06-15: qty 120

## 2017-06-15 MED ORDER — METHYLPREDNISOLONE SODIUM SUCC 125 MG IJ SOLR
60.0000 mg | Freq: Two times a day (BID) | INTRAMUSCULAR | Status: DC
  Administered 2017-06-15 – 2017-06-18 (×7): 60 mg via INTRAVENOUS
  Filled 2017-06-15 (×7): qty 2

## 2017-06-15 NOTE — Progress Notes (Signed)
RT found pt on 6L HFNC, sats 100%. Neb tx done. RT placed pt on 4L [place]

## 2017-06-15 NOTE — Progress Notes (Signed)
Pt is on Bipap and resting well.. No distress noted and saturating 100%  At this time. Pt is on 12/6 and 40%.

## 2017-06-15 NOTE — Consult Note (Signed)
[facility] CM Primary Care Navigator  06/15/2017  Derrick Alexander June 22, 1945 [id number]   Met with patientand wife [name]) at the bedside to identify possible discharge needs. Patient reportshaving difficulty breathing and congestion that hadled to this admission.  Patient's wifeendorses that Dr[name] provider.   [facility].  Patient reports thatwife has been managing his medications at home straight out of the containers ("only taking a few").  Patient's wifementioned that he was driving prior to admission but she will providetransportation to his doctors'appointments if needed after discharge.  Per patient, wife is the primary caregiver at home.  Per MD Note, patient still not stable for discharge given deterioration in respiratory status.  Discharge still pending medical interventions but patient and wife states hoping to go home when stable.  Patientand wife voiced understanding to call primary care provider's officewhenhereturns home,for a post discharge follow-upappointment within1-2weeksor sooner if needs arise.Patient letter (with PCP's contact number) was provided as areminder.  Discussed withpatientand wife regarding[facility] CM services available for health managementat home.  Wife reports that primary care provider mentioned on his visit that patient will be referred to a "pulmonary specialist" when discharged.  Patient voiced preference of opting andverbally agreeingtoEMMICOPD callsforfollow-up of hisrecovery until he gets to see the pulmonologist and will decide on further actions for management by then.   Referral was made for [facility] COPD[facility] after discharge.  Patientand wife voicedunderstanding of needto seekreferral from primary care provider to [facility] care management if deemed  necessaryand appropriate for further services in the near future.  [facility] care management information provided for future needs thatpatientmay have.  . For questions, please contact:  Dannielle Huh, BSN, RN- [facility] Primary Care Navigator  Telephone: [phone] [place]

## 2017-06-15 NOTE — Progress Notes (Signed)
RT weaned FIO2 2L [place]. Pt tol well

## 2017-06-15 NOTE — Progress Notes (Signed)
Progress Note    Derrick Alexander  TOI:[id number] DOB: [birth year]  DOA: 06/13/2017 PCP: [name], MD    Brief Narrative:   Chief complaint: Follow-up shortness of breath  Medical records reviewed and are as summarized below:  Derrick Alexander is an 72 y.o. male with a PMH of COPD, ongoing tobacco abuse, hypertension, PAF on Xarelto, and obesity who was admitted 06/13/17 for evaluation of progressive shortness of breath associated with cough and increased sputum production. Upon initial evaluation, he was found to be in respiratory distress with rapid A. fib. He was treated with continuous nebulizer treatments and Solu-Medrol. His respiratory status deteriorated and he required BiPAP.  Assessment/Plan:   Principal Problem:   COPD with exacerbation/Acute respiratory distress Continues to require BiPAP intermittently and needed to be placed on BiPAP overnight for a few hours. No obvious pneumonia on chest x-ray. BNP WNL. Continue bronchodilators, azithromycin, flutter valve. WBC up to 17.4, likely demargination due to steroids. Will discontinue prednisone and start Solu-Medrol given ongoing severe bronchospasm, and add Mucinex.  Active Problems:   Paroxysmal atrial fibrillation with RVR (HCC)/H/O NSVT and syncope Follows with Dr. Curt Bears Q 6 months. CHA2DS2-VASc Score and unadjusted Ischemic Stroke Rate (% per year) is equal to 4.8 % stroke rate/year from a score of 4. He is already being treated with Xarelto. Heart rate remains well controlled. Currently on Cardizem 30 mg every 12 hours.    Tobacco use Tobacco cessation counseling per nursing.      Obesity Body mass index is 35.59 kg/m.      Low TSH level Free T4 is WNL.    Hypertension Blood pressure well controlled on Norvasc, hydrochlorothiazide, and lisinopril.    Hyperkalemia Kayexalate ordered.    Acute kidney injury Creatinine back to baseline today.  Family Communication/Anticipated D/C date and plan/Code Status    DVT prophylaxis: Xarelto ordered. Code Status: Full Code.  Family Communication: Wife at bedside. Disposition Plan: Not stable for discharge given deterioration in respiratory status and ongoing severe bronchospasm requiring IV steroids for management.   Medical Consultants:    None.   Anti-Infectives:    Azithromycin 06/13/17--->  Subjective:   Derrick Alexander remains short of breath with a cough. Decreased activity tolerance. No chest pain.  Objective:    Vitals:   06/14/17 2240 06/15/17 0015 06/15/17 0211 06/15/17 0547  BP:  (!) 145/78  (!) 145/85  Pulse:  77 72 69  Resp: 18 (!) 21 17 20   Temp:  97.9 F (36.6 C)  97.8 F (36.6 C)  TempSrc:  Axillary  Oral  SpO2:  100% 100% 94%  Weight:    100 kg (220 lb 8 oz)  Height:        Intake/Output Summary (Last 24 hours) at 06/15/2017 0731 Last data filed at 06/14/2017 2300 Gross per 24 hour  Intake 720 ml  Output 350 ml  Net 370 ml   Filed Weights   06/13/17 0559 06/14/17 0505 06/15/17 0547  Weight: 103.4 kg (228 lb) 100.9 kg (222 lb 6.4 oz) 100 kg (220 lb 8 oz)    Exam: General: Obese male in mild distress from ongoing shortness of breath. Cardiovascular: Heart sounds irregular. No gallops or rubs. No murmurs. No JVD. Lungs: Diffuse coarse expiratory wheezes throughout with prolonged expiratory phase. Abdomen: Soft, nontender, nondistended with normal active bowel sounds. No masses. No hepatosplenomegaly. Neurological: Alert and oriented 3. Moves all extremities 4 with equal strength. Cranial nerves II through XII grossly intact. Skin: Warm and  dry. No rashes or lesions. Extremities: No clubbing or cyanosis. 1+ edema. Pedal pulses 2+. Psychiatric: Mood and affect are normal. Insight and judgment are fair.   Data Reviewed:   I have personally reviewed following labs and imaging studies:  Labs: Labs show the following:   Basic Metabolic Panel: Recent Labs  Lab 06/13/17 0603 06/14/17 0726 06/15/17 0528   NA 137 137 139  K 4.4 5.4* 5.4*  CL 102 102 103  CO2 21* 22 29  GLUCOSE 123* 151* 108*  BUN 11 32* 34*  CREATININE 1.02 1.76* 1.08  CALCIUM 9.2 9.3 9.5   GFR Estimated Creatinine Clearance: 68.5 mL/min (by C-G formula based on SCr of 1.08 mg/dL).  CBC: Recent Labs  Lab 06/13/17 0603 06/14/17 0726  WBC 9.6 17.4*  NEUTROABS 7.7  --   HGB 15.4 14.2  HCT 45.5 43.2  MCV 87.7 89.1  PLT 173 183   CBG: Recent Labs  Lab 06/13/17 2136  GLUCAP 142*   Thyroid function studies: Recent Labs    06/13/17 1231  TSH 0.207*    Microbiology Recent Results (from the past 240 hour(s))  MRSA PCR Screening     Status: None   Collection Time: 06/14/17  6:01 AM  Result Value Ref Range Status   MRSA by PCR NEGATIVE NEGATIVE Final    Comment:        The GeneXpert MRSA Assay (FDA approved for NASAL specimens only), is one component of a comprehensive MRSA colonization surveillance program. It is not intended to diagnose MRSA infection nor to guide or monitor treatment for MRSA infections.     Procedures and diagnostic studies:  Dg Chest Portable 1 View: 06/13/2017: My independent review of the image shows: Cardiomegaly. No pneumonia.     Medications:   . azithromycin  250 mg Oral Daily  . chlorhexidine  15 mL Mouth Rinse BID  . diltiazem  30 mg Oral Q12H  . levalbuterol  0.63 mg Nebulization Q6H  . mouth rinse  15 mL Mouth Rinse q12n4p  . predniSONE  50 mg Oral Q breakfast  . rivaroxaban  20 mg Oral Q supper   Continuous Infusions: . albuterol 10 mg/hr (06/13/17 1231)     LOS: 2 days    Greater than 35 minutes spent on this patient's care with greater than 50% of the time spent in face-to-face contact with the patient and his wife to discuss disease process, current laboratory values, and plan of care. All of their questions were answered to their satisfaction.  Margreta Journey Rama  [facility] Pager [phone]. If unable to reach me by pager, please call  my cell phone at [phone].  *Please refer to [email or web address], password [email or web address] to get updated schedule on who will round on this patient, as hospitalists switch teams weekly. If 7PM-7AM, please contact night-coverage at [email or web address], password [email or web address] for any overnight needs.  06/15/2017, 7:31 AM

## 2017-06-16 DIAGNOSIS — R7989 Other specified abnormal findings of blood chemistry: Secondary | ICD-10-CM

## 2017-06-16 DIAGNOSIS — R0603 Acute respiratory distress: Secondary | ICD-10-CM

## 2017-06-16 DIAGNOSIS — I4891 Unspecified atrial fibrillation: Secondary | ICD-10-CM

## 2017-06-16 DIAGNOSIS — J441 Chronic obstructive pulmonary disease with (acute) exacerbation: Principal | ICD-10-CM

## 2017-06-16 DIAGNOSIS — I10 Essential (primary) hypertension: Secondary | ICD-10-CM

## 2017-06-16 DIAGNOSIS — J410 Simple chronic bronchitis: Secondary | ICD-10-CM

## 2017-06-16 DIAGNOSIS — Z720 Tobacco use: Secondary | ICD-10-CM

## 2017-06-16 MED ORDER — NICOTINE 21 MG/24HR TD PT24
21.0000 mg | MEDICATED_PATCH | Freq: Every day | TRANSDERMAL | Status: DC
  Administered 2017-06-16 – 2017-06-19 (×4): 21 mg via TRANSDERMAL
  Filled 2017-06-16 (×4): qty 1

## 2017-06-16 MED ORDER — SODIUM POLYSTYRENE SULFONATE PO POWD
45.0000 g | Freq: Once | ORAL | Status: AC
  Administered 2017-06-16: 45 g via ORAL
  Filled 2017-06-16: qty 45

## 2017-06-16 NOTE — Progress Notes (Signed)
PROGRESS NOTE  Derrick Alexander  GQQ:[id number] DOB: Apr 02, 1946 DOA: 06/13/2017 PCP: [name], MD   Brief Narrative: Derrick Alexander is an 72 y.o. male with a PMH of significant tobacco abuse, suspected COPD, HTN, PAF on xarelto, and obesity who was admitted 06/13/17 for evaluation of progressive shortness of breath associated with cough and increased sputum production. Upon initial evaluation, he was found to be in respiratory distress with rapid A. fib. He was treated for presumptive COPD exacerbation with continuous nebulizer treatments and Solu-Medrol. His respiratory status deteriorated and he required BiPAP, continues to require this intermittently.   Assessment & Plan: Principal Problem:   Acute respiratory distress Active Problems:   Atrial fibrillation (HCC)   Dyspnea   Paroxysmal atrial fibrillation with RVR (HCC)   COPD with exacerbation (HCC)   Obesity   Tobacco use   Essential hypertension   Low TSH level  COPD with exacerbation/Acute respiratory distress - Attempt to wean from BiPAP, though lung exam continues to demonstrate airflow limitation. ?viral bronchitis. RVP not checked.  - Will keep IV steroids while lung exam showing such bronchospasm, failed attempt to wean to prednisone previously.  - Continue nebulizers - continue azithromycin x5 days - Flutter valve, OOB.  - Recommend outpatient sleep study and PFTs  Paroxysmal atrial fibrillation with RVR, History of NSVT and syncope: Pt of Dr. Curt Bears. - Continue rate control with diltiazem, room to increase if rate becomes uncontrolled (due to respiratory distress, steroids, nebs) - Continue xarelto for CHA2DS2-VASc of 4.   Tobacco use:  - Cessation counseling provided - Nicotine patch ordered.   Obesity: Body mass index is 35.59 kg/m.  - Counseled for weight loss in effort to improve what may be sleep apnea.   Low TSH level: Free T4 is normal, therefore recommend rechecking as clinically indicated.    Hypertension:  - Continue norvasc, HCTZ, lisinopril.   Hyperkalemia: ?hemolysis.  - Kayexalate, recheck in AM  Acute kidney injury: Resolved.   DVT prophylaxis: Xarelto Code Status: Full Family Communication: Wife at bedside Disposition Plan: DC to home Consultants: None Procedures: None Antimicrobials: Azithromycin 1/23 >>   Subjective: Dyspnea generally improving, but still needed BiPAP last night, in fact felt better on it last night than any previous night.   Objective: BP (!) 148/72 (BP Location: Left Arm)   Pulse 84   Temp 97.7 F (36.5 C) (Oral)   Resp (!) 21   Ht 5\' 6"  (1.676 m)   Wt 100.1 kg (220 lb 9.6 oz)   SpO2 95%   BMI 35.61 kg/m   Gen: Pleasant, obese 72 y.o. male in no distress Pulm: Labored toward the end of sentences, prolonged expiration, diffuse rhonchi and expiratory wheezes without crackles. Good air movement.  CV: Regular rate and rhythm. No murmur, rub, or gallop. No JVD, trace pedal edema. GI: Abdomen soft, non-tender, non-distended, with normoactive bowel sounds. No organomegaly or masses felt. Ext: Warm, no deformities Skin: No rashes, lesions no ulcers Neuro: Alert and oriented. No focal neurological deficits. Psych: Judgement and insight appear normal. Mood & affect appropriate.   CBC: Recent Labs  Lab 06/13/17 0603 06/14/17 0726  WBC 9.6 17.4*  NEUTROABS 7.7  --   HGB 15.4 14.2  HCT 45.5 43.2  MCV 87.7 89.1  PLT 173 671   Basic Metabolic Panel: Recent Labs  Lab 06/13/17 0603 06/14/17 0726 06/15/17 0528  NA 137 137 139  K 4.4 5.4* 5.4*  CL 102 102 103  CO2 21* 22 29  GLUCOSE 123*  151* 108*  BUN 11 32* 34*  CREATININE 1.02 1.76* 1.08  CALCIUM 9.2 9.3 9.5   Thyroid Function Tests: Recent Labs    06/14/17 0800  FREET4 0.87    LOS: 3 days   Time spent: 25 minutes.  Vance Gather, MD [facility] Pager [phone]  If 7PM-7AM, please contact night-coverage [email or web address] Password [email or web address] 06/16/2017, 2:31  PM

## 2017-06-16 NOTE — Plan of Care (Signed)
Maintains Oxygen sats 95% on Room Air while awake. Continues with Bipap at night  for sleep. PO Antibiotics.

## 2017-06-17 LAB — BASIC METABOLIC PANEL
ANION GAP: 13 (ref 5–15)
BUN: 32 mg/dL — AB (ref 6–20)
CHLORIDE: 98 mmol/L — AB (ref 101–111)
CO2: 26 mmol/L (ref 22–32)
Calcium: 9.4 mg/dL (ref 8.9–10.3)
Creatinine, Ser: 0.89 mg/dL (ref 0.61–1.24)
GFR calc Af Amer: >60 mL/min (ref >60)
GFR calc non Af Amer: >60 mL/min (ref >60)
Glucose, Bld: 124 mg/dL — ABNORMAL HIGH (ref 65–99)
Potassium: 4 mmol/L (ref 3.5–5.1)
SODIUM: 137 mmol/L (ref 135–145)

## 2017-06-17 NOTE — Progress Notes (Signed)
PROGRESS NOTE  Derrick Alexander  ZOX:[id number] DOB: 1946/01/05 DOA: 06/13/2017 PCP: [name], MD   Brief Narrative: Derrick Alexander is an 72 y.o. male with a PMH of significant tobacco abuse, suspected COPD, HTN, PAF on xarelto, and obesity who was admitted 06/13/17 for evaluation of progressive shortness of breath associated with cough and increased sputum production. Upon initial evaluation, he was found to be in respiratory distress with rapid A. fib. He was treated for presumptive COPD exacerbation with continuous nebulizer treatments and Solu-Medrol. His respiratory status deteriorated and he required BiPAP, continues to require this intermittently.   Assessment & Plan: Principal Problem:   Acute respiratory distress Active Problems:   Atrial fibrillation (HCC)   Dyspnea   Paroxysmal atrial fibrillation with RVR (HCC)   COPD with exacerbation (HCC)   Obesity   Tobacco use   Essential hypertension   Low TSH level  COPD with exacerbation/Acute respiratory distress: Generally improving, though failed taper earlier this admission. Will continue IV steroids an additional day and reassess in AM. If improvement continues, will plan transition to prednisone 1/28.  - Continue off BiPAP if able.  - Continue nebulizers - Completed azithromycin x5 days - Flutter valve, OOB.  - Recommend outpatient sleep study and PFTs  Paroxysmal atrial fibrillation with RVR, History of NSVT and syncope: Pt of Dr. Curt Bears. - Continue rate control with diltiazem, room to increase if rate becomes uncontrolled (due to respiratory distress, steroids, nebs) - Continue xarelto for CHA2DS2-VASc of 4.   Tobacco use:  - Cessation counseling provided - Nicotine patch ordered.   Obesity: Body mass index is 35.59 kg/m.  - Counseled for weight loss in effort to improve what may be sleep apnea.   Low TSH level: Free T4 is normal, therefore recommend rechecking as clinically indicated.   Hypertension:  -  Continue norvasc, HCTZ, lisinopril.   Hyperkalemia: ?hemolysis.  - Kayexalate given, rechecking this AM (initial sample was hemolyzed)  Acute kidney injury: Resolved.   DVT prophylaxis: Xarelto Code Status: Full Family Communication: Wife at bedside Disposition Plan: DC to home once improved.  Consultants: None Procedures: None Antimicrobials: Azithromycin 1/23 >>   Subjective: Dyspnea better enough to shower, felt like he may have needed BiPAP last night but did not use it, feeling ok now. Still "weak as water." No chest pain or fever. Having difficulty with getting rid of/coughing up chest congestion.   Objective: BP (!) 169/78 (BP Location: Left Arm)   Pulse 99   Temp 97.9 F (36.6 C) (Oral)   Resp 18   Ht 5\' 6"  (1.676 m)   Wt 100.9 kg (222 lb 8 oz)   SpO2 97%   BMI 35.91 kg/m   Gen: Pleasant, obese 72 y.o. male in no distress Pulm: Prolonged expiration with wheezes stable. Rhonchi in lower zones improved.  CV: Regular rate and rhythm. No murmur, rub, or gallop. No JVD, trace pedal edema. GI: Abdomen soft, non-tender, non-distended, with normoactive bowel sounds. No organomegaly or masses felt. Ext: Warm, no deformities Skin: No rashes, lesions no ulcers Neuro: Alert and oriented. No focal neurological deficits. Psych: Judgement and insight appear normal. Mood & affect appropriate.   CBC: Recent Labs  Lab 06/13/17 0603 06/14/17 0726  WBC 9.6 17.4*  NEUTROABS 7.7  --   HGB 15.4 14.2  HCT 45.5 43.2  MCV 87.7 89.1  PLT 173 811   Basic Metabolic Panel: Recent Labs  Lab 06/13/17 0603 06/14/17 0726 06/15/17 0528  NA 137 137 139  K 4.4  5.4* 5.4*  CL 102 102 103  CO2 21* 22 29  GLUCOSE 123* 151* 108*  BUN 11 32* 34*  CREATININE 1.02 1.76* 1.08  CALCIUM 9.2 9.3 9.5   Time spent: 25 minutes.  Vance Gather, MD [facility] Pager [phone]  If 7PM-7AM, please contact night-coverage [email or web address] Password [email or web address] 06/17/2017, 12:00 PM

## 2017-06-17 NOTE — Plan of Care (Signed)
Maintains oxygen sats > 93% on RA

## 2017-06-18 MED ORDER — HYDROCHLOROTHIAZIDE 25 MG PO TABS
25.0000 mg | ORAL_TABLET | Freq: Every day | ORAL | Status: DC
  Administered 2017-06-18 – 2017-06-19 (×2): 25 mg via ORAL
  Filled 2017-06-18 (×2): qty 1

## 2017-06-18 MED ORDER — PREDNISONE 20 MG PO TABS
40.0000 mg | ORAL_TABLET | Freq: Every day | ORAL | Status: DC
  Administered 2017-06-19: 40 mg via ORAL
  Filled 2017-06-18: qty 2

## 2017-06-18 MED ORDER — LEVALBUTEROL HCL 0.63 MG/3ML IN NEBU
0.6300 mg | INHALATION_SOLUTION | Freq: Three times a day (TID) | RESPIRATORY_TRACT | Status: DC
  Administered 2017-06-18 – 2017-06-19 (×4): 0.63 mg via RESPIRATORY_TRACT
  Filled 2017-06-18 (×4): qty 3

## 2017-06-18 NOTE — Care Management Important Message (Signed)
Important Message  Patient Details  Name: Derrick Alexander MRN: [id number] Date of Birth: 1945/09/25   Medicare Important Message Given:       [name] 06/18/2017, 12:14 PM

## 2017-06-18 NOTE — Progress Notes (Signed)
PROGRESS NOTE  Derrick Alexander  [id number] DOB: 02-10-1946 DOA: 06/13/2017 PCP: [name], MD   Brief Narrative: Derrick Alexander is an 72 y.o. male with a PMH of significant tobacco abuse, suspected COPD, HTN, PAF on xarelto, and obesity who was admitted 06/13/17 for evaluation of progressive shortness of breath associated with cough and increased sputum production. Upon initial evaluation, he was found to be in respiratory distress with rapid A. fib. He was treated for presumptive COPD exacerbation with continuous nebulizer treatments and Solu-Medrol. His respiratory status deteriorated and he required BiPAP, continued to require this intermittently. Initial attempt at weaning to prednisone was met with deterioration, return to BiPAP, so IV steroids have been continued through 1/28, though pt is overall improving.   Assessment & Plan: Principal Problem:   Acute respiratory distress Active Problems:   Atrial fibrillation (HCC)   Dyspnea   Paroxysmal atrial fibrillation with RVR (HCC)   COPD with exacerbation (HCC)   Obesity   Tobacco use   Essential hypertension   Low TSH level  COPD with exacerbation/Acute respiratory distress: Generally improving, though failed taper earlier this admission. Seems to be sustaining improvement, will hold further IV steroids and monitor. If improvement continues, will plan transition to prednisone to complete taper. - Continue off BiPAP if able.  - Continue nebulizers - Completed azithromycin x5 days - Flutter valve, OOB.  - Recommend outpatient sleep study and PFTs. Will arrange nebulizer and nebs DME per CM at discharge.   Paroxysmal atrial fibrillation with RVR, History of NSVT and syncope: Pt of Dr. Curt Bears. - Continue rate control with diltiazem, room to increase if rate becomes uncontrolled (due to respiratory distress, steroids, nebs) - Continue xarelto for CHA2DS2-VASc of 4.   Tobacco use:  - Cessation counseling provided - Nicotine patch  ordered.   Obesity: Body mass index is 35.59 kg/m.  - Counseled for weight loss in effort to improve what may be sleep apnea.   Low TSH level: Free T4 is normal, therefore recommend rechecking as clinically indicated.   Hypertension:  - Continue HCTZ, lisinopril, holding norvasc w/new diltiazem.   Hyperkalemia: ?hemolysis. Resolved.  Acute kidney injury: Resolved.   DVT prophylaxis: Xarelto Code Status: Full Family Communication: Wife at bedside Disposition Plan: DC to home once improved, possibly in 24 hrs. Consultants: None Procedures: None Antimicrobials: Azithromycin 1/23 - 1/27  Subjective: Dyspnea improved from yesterday, still not at baseline. Did not need BiPAP. Flutter valve improved symptoms. No chest pain.  Objective: BP (!) 153/81 (BP Location: Left Arm)   Pulse 80   Temp 97.7 F (36.5 C) (Oral)   Resp 17   Ht '5\' 6"'$  (1.676 m)   Wt 101 kg (222 lb 9.6 oz)   SpO2 97%   BMI 35.93 kg/m   Gen: Pleasant, obese 72 y.o. male in no distress Pulm: Prolonged expiration, wheezes/rhonchi remain but decreased from prior exam.  CV: Regular rate and rhythm. No murmur, rub, or gallop. No JVD, no pedal edema. GI: Abdomen soft, non-tender, non-distended, with normoactive bowel sounds. No organomegaly or masses felt. Ext: Warm, no deformities Skin: No rashes, lesions no ulcers Neuro: Alert and oriented. No focal neurological deficits. Psych: Judgement and insight appear normal. Mood & affect appropriate.   CBC: Recent Labs  Lab 06/13/17 0603 06/14/17 0726  WBC 9.6 17.4*  NEUTROABS 7.7  --   HGB 15.4 14.2  HCT 45.5 43.2  MCV 87.7 89.1  PLT 173 846   Basic Metabolic Panel: Recent Labs  Lab 06/13/17 0603  06/14/17 0726 06/15/17 0528 06/17/17 1125  NA 137 137 139 137  K 4.4 5.4* 5.4* 4.0  CL 102 102 103 98*  CO2 21* '22 29 26  '$ GLUCOSE 123* 151* 108* 124*  BUN 11 32* 34* 32*  CREATININE 1.02 1.76* 1.08 0.89  CALCIUM 9.2 9.3 9.5 9.4   Time spent: 25  minutes.  Vance Gather, MD [facility] Pager [phone]  If 7PM-7AM, please contact night-coverage [email or web address] Password [email or web address] 06/18/2017, 1:14 PM

## 2017-06-19 ENCOUNTER — Other Ambulatory Visit: Payer: Self-pay | Admitting: Cardiology

## 2017-06-19 ENCOUNTER — Inpatient Hospital Stay (HOSPITAL_COMMUNITY): Payer: Medicare Other

## 2017-06-19 DIAGNOSIS — Z23 Encounter for immunization: Secondary | ICD-10-CM | POA: Diagnosis not present

## 2017-06-19 MED ORDER — ALBUTEROL SULFATE (2.5 MG/3ML) 0.083% IN NEBU: 2.5000 mg | INHALATION_SOLUTION | Freq: Four times a day (QID) | RESPIRATORY_TRACT | 0 refills | Status: DC | PRN

## 2017-06-19 MED ORDER — DILTIAZEM HCL 30 MG PO TABS: 30.0000 mg | ORAL_TABLET | Freq: Two times a day (BID) | ORAL | 0 refills | Status: DC

## 2017-06-19 MED ORDER — PREDNISONE 20 MG PO TABS: ORAL_TABLET | ORAL | 0 refills | Status: AC

## 2017-06-19 NOTE — Discharge Summary (Signed)
Physician Discharge Summary  Derrick Alexander MCN:[id number] DOB: [birth year] DOA: 06/13/2017  PCP: [name], MD  Admit date: 06/13/2017 Discharge date: 06/19/2017  Admitted From: Home Disposition: Home   Recommendations for Outpatient Follow-up:  1. Follow up with PCP has been scheduled by the patient's wife for 2/1. Consider further evaluation of pulmonary function and formal sleep study.  2. Follow up with cardiology, monitor HR/BP (started on diltiazem in place of norvasc for rapid AFib)  Home Health: None Equipment/Devices: Nebulizer Discharge Condition: Stable CODE STATUS: Full Diet recommendation: Heart healthy  Brief/Interim Summary: Derrick Alexander an 72 y.o.malewith a PMH of significant tobacco abuse, suspected COPD, HTN, PAF on xarelto, and obesity who was admitted 06/13/17 for evaluation of progressive shortness of breath associated with cough and increased sputum production. Upon initial evaluation, he was found to be in respiratory distress with rapid A. fib. He was treated for presumptive COPD exacerbation with continuous nebulizer treatments and Solu-Medrol. His respiratory status deteriorated and he required BiPAP, continued to require this intermittently. Initial attempt at weaning to prednisone was met with deterioration, return to BiPAP, so IV steroids werecontinued through 1/28 when they were tapered to prednisone with sustained improvement.   Discharge Diagnoses:  Principal Problem:   Acute respiratory distress Active Problems:   Atrial fibrillation (HCC)   Dyspnea   Paroxysmal atrial fibrillation with RVR (HCC)   COPD with exacerbation (HCC)   Obesity   Tobacco use   Essential hypertension   Low TSH level  COPD with exacerbation/Acute respiratory distress:  - Continue nebulizers at home, DME nebulizer arranged prior to DC. - Completed azithromycin x5 days - Continue prednisone - Flutter valve, OOB.  - Recommend outpatient sleep study and PFTs.     Paroxysmal atrial fibrillation with RVR, History of NSVT and syncope: Pt of Dr. Curt Bears. - Continue rate control with diltiazem - Continue xarelto for CHA2DS2-VASc of 4.   Tobacco use:  - Cessation counseling provided - Nicotine patch ordered.   Obesity: Body mass index is 35.59 kg/m. - Counseled for weight loss in effort to improve what may be sleep apnea.   Low TSH level: Free T4 is normal, therefore recommend rechecking as clinically indicated.   Hypertension:  - Continue HCTZ, lisinopril, holding norvasc w/new diltiazem.   Hyperkalemia: ?hemolysis. Resolved.  Acute kidney injury: Resolved.   Discharge Instructions Discharge Instructions    Diet - low sodium heart healthy   Complete by:  As directed    Discharge instructions   Complete by:  As directed    Your chest xray looked just fine today. You are stable for discharge with the following recommendations:  - Stop norvasc and start diltiazem twice daily as directed (sent to your pharmacy).  - Continue prednisone in a taper as directed over 10 days. Please note this prescription was PRINTED and provided at discharge.  - Follow up with Dr. Shelia Media Friday. A copy of the discharge summary has been sent to him through the electronic medical record.  - Use the nebulizer arranged by care management at home with albuterol every 6 hours for the next 2 days, then as needed for shortness of breath or wheezing.  - If you experience worsening shortness of breath, fever, chest pain, seek medical attention right away.   Increase activity slowly   Complete by:  As directed      Allergies as of 06/19/2017   No Known Allergies     Medication List    STOP taking these medications   amLODipine  5 MG tablet Commonly known as:  NORVASC     TAKE these medications   albuterol (2.5 MG/3ML) 0.083% nebulizer solution Commonly known as:  PROVENTIL Take 3 mLs (2.5 mg total) by nebulization every 6 (six) hours as needed for wheezing  or shortness of breath.   CORICIDIN HBP COUGH/COLD PO Take 1 tablet by mouth daily as needed (cold).   diltiazem 30 MG tablet Commonly known as:  CARDIZEM Take 1 tablet (30 mg total) by mouth every 12 (twelve) hours.   hydrochlorothiazide 25 MG tablet Commonly known as:  HYDRODIURIL take 1 tablet by mouth once daily   lisinopril 40 MG tablet Commonly known as:  PRINIVIL,ZESTRIL TAKE 1 TABLET BY MOUTH ONCE DAILY   nicotine 21 mg/24hr patch Commonly known as:  NICODERM CQ - dosed in mg/24 hours Place 21 mg onto the skin daily.   predniSONE 20 MG tablet Commonly known as:  DELTASONE Take 2 tablets (40 mg total) by mouth daily with breakfast for 3 days, THEN 1 tablet (20 mg total) daily with breakfast for 3 days, THEN 0.5 tablets (10 mg total) daily with breakfast for 4 days. Start taking on:  06/20/2017   XARELTO 20 MG Tabs tablet Generic drug:  rivaroxaban TAKE 1 TABLET BY MOUTH ONCE DAILY WITH SUPPER            Durable Medical Equipment  (From admission, onward)        Start     Ordered   06/19/17 1043  For home use only DME Nebulizer machine  Once    Question:  Patient needs a nebulizer to treat with the following condition  Answer:  Acute respiratory distress   06/19/17 [place] Follow up.   WhySystems developer information: [place] [id number] [phone]        [name], MD Follow up.   Specialty:  Internal Medicine Contact information: [place] [address] [phone]          No Known Allergies  Consultations:  None  Procedures/Studies: Dg Chest Port 1 View  Result Date: 06/19/2017 CLINICAL DATA:  Shortness of breath, history of COPD, cough, congestion EXAM: PORTABLE CHEST 1 VIEW COMPARISON:  Portable chest x-ray of 06/13/2017 FINDINGS: No active infiltrate or effusion is seen. Mediastinal and hilar contours  are unremarkable. The heart is mildly enlarged. No bony abnormality is seen. IMPRESSION: Mild cardiomegaly.  No active lung disease. Electronically Signed   By: Ivar Drape M.D.   On: 06/19/2017 10:19   Dg Chest Portable 1 View  Result Date: 06/13/2017 CLINICAL DATA:  Hypertension and shortness of breath for 1 day. EXAM: PORTABLE CHEST 1 VIEW COMPARISON:  06/15/2015 FINDINGS: Normal heart size and pulmonary vascularity. No focal airspace disease or consolidation in the lungs. No blunting of costophrenic angles. No pneumothorax. Mediastinal contours appear intact. Old left rib fractures. Calcification of the aorta. IMPRESSION: No evidence of active pulmonary disease.  Aortic atherosclerosis. Electronically Signed   By: Lucienne Capers M.D.   On: 06/13/2017 06:24   Subjective: Breathing much better, still having nonproductive cough but dyspnea is better and has not needed oxygen for several days.   Discharge Exam: Vitals:   06/19/17 0925 06/19/17 1128  BP:  (!) 166/80  Pulse:  71  Resp:  19  Temp:  98.6 F (37 C)  SpO2: 94% 93%   General: Comfortable  Cardiovascular: RRR, S1/S2 +, no rubs, no gallops Respiratory: Nonlabored, scattered expiratory rhonchi without wheezes or crackles.  Abdominal: Soft, NT, ND, bowel sounds + Extremities: No edema, no cyanosis  Labs: BNP (last 3 results) Recent Labs    06/13/17 0603  BNP 67.8   Basic Metabolic Panel: Recent Labs  Lab 06/13/17 0603 06/14/17 0726 06/15/17 0528 06/17/17 1125  NA 137 137 139 137  K 4.4 5.4* 5.4* 4.0  CL 102 102 103 98*  CO2 21* '22 29 26  '$ GLUCOSE 123* 151* 108* 124*  BUN 11 32* 34* 32*  CREATININE 1.02 1.76* 1.08 0.89  CALCIUM 9.2 9.3 9.5 9.4   Liver Function Tests: No results for input(s): AST, ALT, ALKPHOS, BILITOT, PROT, ALBUMIN in the last 168 hours. No results for input(s): LIPASE, AMYLASE in the last 168 hours. No results for input(s): AMMONIA in the last 168 hours. CBC: Recent Labs  Lab  06/13/17 0603 06/14/17 0726  WBC 9.6 17.4*  NEUTROABS 7.7  --   HGB 15.4 14.2  HCT 45.5 43.2  MCV 87.7 89.1  PLT 173 183   Cardiac Enzymes: No results for input(s): CKTOTAL, CKMB, CKMBINDEX, TROPONINI in the last 168 hours. BNP: Invalid input(s): POCBNP CBG: Recent Labs  Lab 06/13/17 2136  GLUCAP 142*   D-Dimer No results for input(s): DDIMER in the last 72 hours. Hgb A1c No results for input(s): HGBA1C in the last 72 hours. Lipid Profile No results for input(s): CHOL, HDL, LDLCALC, TRIG, CHOLHDL, LDLDIRECT in the last 72 hours. Thyroid function studies No results for input(s): TSH, T4TOTAL, T3FREE, THYROIDAB in the last 72 hours.  Invalid input(s): FREET3 Anemia work up No results for input(s): VITAMINB12, FOLATE, FERRITIN, TIBC, IRON, RETICCTPCT in the last 72 hours. Urinalysis No results found for: COLORURINE, APPEARANCEUR, [place], [place], [place], [place], [place], [place], [place], [place], NITRITE, [place]  Microbiology Recent Results (from the past 240 hour(s))  MRSA PCR Screening     Status: None   Collection Time: 06/14/17  6:01 AM  Result Value Ref Range Status   MRSA by PCR NEGATIVE NEGATIVE Final    Comment:        The GeneXpert MRSA Assay (FDA approved for NASAL specimens only), is one component of a comprehensive MRSA colonization surveillance program. It is not intended to diagnose MRSA infection nor to guide or monitor treatment for MRSA infections.     Time coordinating discharge: Approximately 40 minutes  Vance Gather, MD  [facility] 06/19/2017, 12:44 PM Pager [phone]

## 2017-06-19 NOTE — Care Management Note (Addendum)
Case Management Note  Patient Details  Name: Josef Tourigny MRN: [id number] Date of Birth: 04-22-1946  Subjective/Objective:  Pt presented for Acute Respiratory Distress- COPD exacerbation. PTA from home with family support. Plan will be to return home without any [facility] Services.                   Action/Plan: DME referral for Nebulizer- Pt is agreeable and wants to utilize [facility] for Services. CM did make referral to [facility] and DME to be delivered to room prior to d/c. No further needs from CM @ this time.  Expected Discharge Date:                  Expected Discharge Plan:  Home/Self Care  In-House Referral:  NA  Discharge planning Services  CM Consult  Post Acute Care Choice:  Durable Medical Equipment Choice offered to:  Patient  DME Arranged:  Nebulizer machine DME Agency:  [place]:  NA [place] Agency:  NA  Status of Service:  Completed, signed off  If discussed at [place] of Stay Meetings, dates discussed:  06-19-17  Additional Comments:  Bethena Roys, RN 06/19/2017, 10:45 AM

## 2017-06-19 NOTE — Care Management Important Message (Signed)
Important Message  Patient Details  Name: Derrick Alexander MRN: [id number] Date of Birth: 1945/06/23   Medicare Important Message Given:  Yes    [name] 06/19/2017, 1:08 PM

## 2017-06-22 DIAGNOSIS — Z09 Encounter for follow-up examination after completed treatment for conditions other than malignant neoplasm: Secondary | ICD-10-CM | POA: Diagnosis not present

## 2017-06-22 DIAGNOSIS — I4891 Unspecified atrial fibrillation: Secondary | ICD-10-CM | POA: Diagnosis not present

## 2017-06-22 DIAGNOSIS — J449 Chronic obstructive pulmonary disease, unspecified: Secondary | ICD-10-CM | POA: Diagnosis not present

## 2017-06-22 DIAGNOSIS — I10 Essential (primary) hypertension: Secondary | ICD-10-CM | POA: Diagnosis not present

## 2017-06-25 DIAGNOSIS — E0590 Thyrotoxicosis, unspecified without thyrotoxic crisis or storm: Secondary | ICD-10-CM | POA: Diagnosis not present

## 2017-06-25 DIAGNOSIS — B370 Candidal stomatitis: Secondary | ICD-10-CM | POA: Diagnosis not present

## 2017-06-25 DIAGNOSIS — J441 Chronic obstructive pulmonary disease with (acute) exacerbation: Secondary | ICD-10-CM | POA: Diagnosis not present

## 2017-06-25 DIAGNOSIS — I10 Essential (primary) hypertension: Secondary | ICD-10-CM | POA: Diagnosis not present

## 2017-07-02 ENCOUNTER — Encounter: Payer: Self-pay | Admitting: Internal Medicine

## 2017-07-02 ENCOUNTER — Ambulatory Visit: Payer: Medicare Other | Admitting: Internal Medicine

## 2017-07-02 VITALS — BP 124/70 | HR 70 | Ht 65.25 in | Wt 223.0 lb

## 2017-07-02 DIAGNOSIS — R0609 Other forms of dyspnea: Secondary | ICD-10-CM

## 2017-07-02 MED ORDER — OLMESARTAN MEDOXOMIL 20 MG PO TABS: 20.0000 mg | ORAL_TABLET | Freq: Every day | ORAL | 2 refills | Status: DC

## 2017-07-02 NOTE — Patient Instructions (Addendum)
Stop lisinopril and start benicar (olmesartan) 20 mg one daily and can take it twice daily if needed   Start  Try prilosec otc 20mg   Take 30-60 min before first meal of the day and Pepcid ac (famotidine) 20 mg one @  bedtime until cough is completely gone for at least a week without the need for cough suppression  GERD (REFLUX)  is an extremely common cause of respiratory symptoms just like yours , many times with no obvious heartburn at all.    It can be treated with medication, but also with lifestyle changes including elevation of the head of your bed (ideally with 6 inch  bed blocks),  Smoking cessation, avoidance of late meals, excessive alcohol, and avoid fatty foods, chocolate, peppermint, colas, red wine, and acidic juices such as orange juice.  NO MINT OR MENTHOL PRODUCTS SO NO COUGH DROPS   USE SUGARLESS CANDY INSTEAD (Jolley ranchers or Stover's or Life Savers) or even ice chips will also do - the key is to swallow to prevent all throat clearing. NO OIL BASED VITAMINS - use powdered substitutes.   As needed for shortness of breath ok to use nebulizer up to every 4 hours as needed    Please schedule a follow up office visit in 4 weeks, sooner if needed full pfts on return and all active medications

## 2017-07-02 NOTE — Progress Notes (Signed)
Subjective:     Patient ID: Derrick Alexander, male   DOB: 1945-12-09,   MRN: [id number]  HPI  72 yowm quit smoking 06/13/17 with chronic raspy breathing  Per wife admit to [facility] :  Admit date: 06/13/2017 Discharge date: 06/19/2017   Recommendations for Outpatient Follow-up:  1. Follow up with PCP has been scheduled by the patient's wife for 2/1. Consider further evaluation of pulmonary function and formal sleep study.  2. Follow up with cardiology, monitor HR/BP (started on diltiazem in place of norvasc for rapid AFib)     Brief/Interim Summary: Derrick Alexander an 72 y.o.malewith a PMH of significant tobacco abuse, suspected COPD, HTN, PAF on xarelto, and obesity who was admitted 06/13/17 for evaluation of progressive shortness of breath associated with cough and increased sputum production. Upon initial evaluation, he was found to be in respiratory distress with rapid A. fib. He was treated for presumptive COPD exacerbation with continuous nebulizer treatments and Solu-Medrol. His respiratory status deteriorated and he required BiPAP, continuedto require this intermittently. Initial attempt at weaning to prednisone was met with deterioration, return to BiPAP, so IV steroids werecontinued through 1/28 when they were tapered to prednisone with sustained improvement.     Discharge Diagnoses:  Principal Problem:   Acute respiratory distress Active Problems:   Atrial fibrillation (HCC)   Dyspnea   Paroxysmal atrial fibrillation with RVR (HCC)   COPD with exacerbation (HCC)   Obesity   Tobacco use   Essential hypertension   Low TSH level  COPD with exacerbation/Acute respiratory distress:  - Continue nebulizers at home, DME nebulizer arranged prior to DC. - Completed azithromycin x5 days - Continue prednisone - Flutter valve, OOB.  - Recommend outpatient sleep study and PFTs.    Paroxysmal atrial fibrillation with RVR, History of NSVT and syncope: Pt of Dr. Curt Bears. - Continue  rate control with diltiazem - Continue xarelto for CHA2DS2-VASc of 4.   Tobacco use:  - Cessation counseling provided - Nicotine patch ordered.   Obesity: Body mass index is 35.59 kg/m. - Counseled for weight loss in effort to improve what may be sleep apnea.   Low TSH level: Free T4 is normal, therefore recommend rechecking as clinically indicated.   Hypertension:  - Continue HCTZ, lisinopril, holding norvasc w/new diltiazem.  Hyperkalemia: ?hemolysis.Resolved.  Acute kidney injury: Resolved.      07/02/2017 [facility]st [facility] office visit/ Shaunae Sieloff   Chief Complaint  Patient presents with  . Pulmonary Consult    Referred by Dr. Auburn Bilberry. Pt states he was admitted to the hospital after becoming SOB from 06/13/17 until 06/19/17.  He states he feels SOB "about all the time" with or without any exertion. His voice has been raspy "sounds like a squeaky toy".  He has a prod cough with clear sputum. He uses albuterol nebs 2-3 x per day on average.   he had h/o chronic raspy breathing "for a long time" per his wife prior to admit but pt had minimal doe including at yard work fall on 2018 but acutely ill w/in a week PTA > resp distress on admit and really not able to  Give hx / placed on bipap and rx as copd exac but says never really improved sob / severe hacking cough even on pred and round the clock nebs.  Sob at rest and immediately supine "like choking" assoc with sore throat and dysphagia/ nasal congestion and mild hb  No obvious day to day or daytime variability or assoc excess/ purulent sputum or  mucus plugs or hemoptysis or cp or chest tightness . No unusual exposure hx or h/o childhood pna/ asthma or knowledge of premature birth.   . Also denies any obvious fluctuation of symptoms with weather or environmental changes or other aggravating or alleviating factors except as outlined above   Current Allergies, Complete Past Medical History, Past Surgical History, Family History,  and Social History were reviewed in [employer] record.  ROS  The following are not active complaints unless bolded Hoarseness, sore throat, dysphagia, dental problems, itching, sneezing,  nasal congestion or discharge of excess mucus or purulent secretions, ear ache,   fever, chills, sweats, unintended wt loss or wt gain, classically pleuritic or exertional cp,  orthopnea pnd or leg swelling, presyncope, palpitations, abdominal pain, anorexia, nausea, vomiting, diarrhea  or change in bowel habits or change in bladder habits, change in stools or change in urine, dysuria, hematuria,  rash, arthralgias, visual complaints, headache, numbness, weakness or ataxia or problems with walking or coordination,  change in mood/affect or memory.        Current Meds  Medication Sig  . albuterol (PROVENTIL) (2.5 MG/3ML) 0.083% nebulizer solution Take 3 mLs (2.5 mg total) by nebulization every 6 (six) hours as needed for wheezing or shortness of breath.  . diltiazem (CARDIZEM) 30 MG tablet Take 1 tablet (30 mg total) by mouth every 12 (twelve) hours.  . hydrochlorothiazide (HYDRODIURIL) 25 MG tablet take 1 tablet by mouth once daily  . nicotine (NICODERM CQ - DOSED IN MG/24 HOURS) 21 mg/24hr patch Place 21 mg onto the skin daily.  Alveda Reasons 20 MG TABS tablet TAKE 1 TABLET BY MOUTH ONCE DAILY WITH SUPPER  .  lisinopril (PRINIVIL,ZESTRIL) 40 MG tablet TAKE 1 TABLET BY MOUTH ONCE DAILY                  Review of Systems     Objective:   Physical Exam Hoarse obese wm with classic pseudo wheeze  Wt Readings from Last 3 Encounters:  07/02/17 223 lb (101.2 kg)  06/19/17 223 lb 6.4 oz (101.3 kg)  05/09/17 228 lb 3.2 oz (103.5 kg)     Vital signs reviewed - Note on arrival 02 sats  98% on RA      HEENT: nl dentition, turbinates bilaterally, and oropharynx. Nl external ear canals without cough reflex   NECK :  without JVD/Nodes/TM/ nl carotid upstrokes  bilaterally   LUNGS: no acc muscle use,  Nl contour chest which is clear to A and P bilaterally with cough on exp > insp maneuvers   CV:  RRR  no s3 or murmur or increase in P2, and no edema   ABD:  soft and nontender with nl inspiratory excursion in the supine position. No bruits or organomegaly appreciated, bowel sounds nl  MS:  Nl gait/ ext warm without deformities, calf tenderness, cyanosis or clubbing No obvious joint restrictions   SKIN: warm and dry without lesions    NEURO:  alert, approp, nl sensorium with  no motor or cerebellar deficits apparent.      I personally reviewed images and agree with radiology impression as follows:  CXR:   06/19/17 Mild cardiomegaly.  No active lung disease  Labs  reviewed:      Chemistry      Component Value Date/Time   NA 137 06/17/2017 1125   K 4.0 06/17/2017 1125   CL 98 (L) 06/17/2017 1125   CO2 26 06/17/2017 1125   BUN 32 (  H) 06/17/2017 1125   CREATININE 0.89 06/17/2017 1125   CREATININE 0.88 12/30/2015 1235      Component Value Date/Time   CALCIUM 9.4 06/17/2017 1125        Lab Results  Component Value Date   WBC 17.4 (H) 06/14/2017   HGB 14.2 06/14/2017   HCT 43.2 06/14/2017   MCV 89.1 06/14/2017   PLT 183 06/14/2017     No results found for: DDIMER    Lab Results  Component Value Date   TSH 0.207 (L) 06/13/2017        T4   Fee                 0.87                                                                 06/14/17     BNP  06/13/17  = 76     Assessment:

## 2017-07-02 NOTE — Assessment & Plan Note (Addendum)
Symptoms are markedly disproportionate to objective findings and not clear this is actually much of a  lung problem but pt does appear to have difficult to sort out respiratory symptoms of unknown origin for which  DDX  = almost all start with A and  include Adherence, Ace Inhibitors, Acid Reflux, Active Sinus Disease, Alpha 1 Antitripsin deficiency, Anxiety masquerading as Airways dz,  ABPA,  Allergy(esp in young), Aspiration (esp in elderly), Adverse effects of meds,  Active smokers, A bunch of PE's (a small clot burden can't cause this syndrome unless there is already severe underlying pulm or vascular dz with poor reserve), Anemia and thryoid dz plus two Bs  = Bronchiectasis and Beta blocker use..and one C= CHF    Adherence is always the initial "prime suspect" and is a multilayered concern that requires a "trust but verify" approach in every patient - starting with knowing how to use medications, especially inhalers, correctly, keeping up with refills and understanding the fundamental difference between maintenance and prns vs those medications only taken for a very short course and then stopped and not refilled.  - return with all meds in hand using a trust but verify approach to confirm accurate Medication  Reconciliation The principal here is that until we are certain that the  patients are doing what we've asked, it makes no sense to ask them to do more.    ACEi adverse effects at the  top of the usual list of suspects and the only way to rule it out is a trial off > see a/p    ? Acid (or non-acid) GERD > always difficult to exclude as up to 75% of pts in some series report no assoc GI/ Heartburn symptoms> rec short term max (24h)  acid suppression and diet restrictions/ reviewed and instructions given in writing.  NB Of the three most common causes of  Sub-acute or recurrent or chronic cough, only one (GERD)  can actually contribute to/ trigger  the other two (asthma and post nasal drip syndrome)   and perpetuate the cylce of cough.  While not intuitively obvious, many patients with chronic low grade reflux do not cough until there is a primary insult that disturbs the protective epithelial barrier and exposes sensitive nerve endings.   This is typically viral but can be direct physical injury such as with an endotracheal tube.   The point is that once this occurs, it is difficult to eliminate the cycle  using anything but a maximally effective acid suppression regimen at least in the short run, accompanied by an appropriate diet to address non acid GERD    ? Allergy/ asthma > unlikely with no resp to prednisone/ will check pfts on return    ? Active smoking > denies/ reinforced  ? Adverse effects of meds > none x for acei listed   ? A bunch of PE's > on xarelto   ? Anemia, thyroid dz > ruled out at admit   ? chf > excluded ith bnp so low     Discussed in detail all the  indications, usual  risks and alternatives  relative to the benefits with patient who agrees to proceed with w/u as outlined.      Total time devoted to counseling  > 50 % of initial 60 min office visit:  review case with pt/wife Joelene  discussion of options/alternatives/ personally creating written customized instructions  in presence of pt  then going over those specific  Instructions directly with the  pt including how to use all of the meds but in particular covering each new medication in detail and the difference between the maintenance= "automatic" meds and the prns using an action plan format for the latter (If this problem/symptom => do that organization reading Left to right).  Please see AVS from this visit for a full list of these instructions which I personally wrote for this pt and  are unique to this visit.

## 2017-07-05 ENCOUNTER — Institutional Professional Consult (permissible substitution): Payer: Medicare Other | Admitting: Internal Medicine

## 2017-07-10 ENCOUNTER — Encounter: Payer: Self-pay | Admitting: Cardiology

## 2017-07-10 ENCOUNTER — Telehealth: Payer: Self-pay | Admitting: Cardiology

## 2017-07-10 ENCOUNTER — Ambulatory Visit: Payer: Medicare Other | Admitting: Cardiology

## 2017-07-10 VITALS — Ht 66.0 in | Wt 220.6 lb

## 2017-07-10 DIAGNOSIS — I480 Paroxysmal atrial fibrillation: Secondary | ICD-10-CM

## 2017-07-10 DIAGNOSIS — R0602 Shortness of breath: Secondary | ICD-10-CM

## 2017-07-10 DIAGNOSIS — I472 Ventricular tachycardia, unspecified: Secondary | ICD-10-CM

## 2017-07-10 DIAGNOSIS — I10 Essential (primary) hypertension: Secondary | ICD-10-CM | POA: Diagnosis not present

## 2017-07-10 MED ORDER — DILTIAZEM HCL ER COATED BEADS 120 MG PO CP24: 120.0000 mg | ORAL_CAPSULE | Freq: Every day | ORAL | 3 refills | Status: DC

## 2017-07-10 NOTE — Progress Notes (Signed)
Electrophysiology Office Note   Date:  07/10/2017   ID:  Derrick Alexander, DOB 02-12-1946, MRN [id number]  PCP:  [name], MD  Cardiologist:  Debara Pickett Primary Electrophysiologist:  Kilea Mccarey [name], MD    Chief Complaint  Patient presents with  . Atrial Fibrillation  . Shortness of Breath     History of Present Illness: Derrick Alexander is a 72 y.o. male who presents today for electrophysiology evaluation.  He has a history of hypertension, left bundle branch block, tobacco abuse with a 60-pack-year smoking history, and nonsustained VT.  He also has paroxysmal atrial fibrillation.  He had an episode of syncope and wore a cardiac monitor that showed no obvious causes for syncope but a 16 beat run of nonsustained VT.  He also had bradycardia into the 30s.  Today, denies symptoms of palpitations, chest pain, lower extremity edema, claudication, dizziness, presyncope, syncope, bleeding, or neurologic sequela. The patient is tolerating medications without difficulties.  He was hospitalized with a likely COPD exacerbation.  He was put on prednisone and azithromycin.  He is planned to have an outpatient sleep study as well as PFTs.  Since discharge, he is continued to have a cough with shortness of breath.  He has been improving.  He was put on Prilosec and Pepcid which has helped.  He is continued to have to sleep sitting up.   Past Medical History:  Diagnosis Date  . A-fib ([place])   . Abnormal EKG 06/18/2015  . COPD (chronic obstructive pulmonary disease) ([place])   . COPD with exacerbation ([place])   . Faintness 06/18/2015  . Hypertension   . LBBB (left bundle branch block) 06/18/2015  . Obesity   . Syncope 08/06/2015  . Tobacco use   . V-tach ([place]) 08/06/2015   No past surgical history on file.   Current Outpatient Medications  Medication Sig Dispense Refill  . albuterol (PROVENTIL) (2.5 MG/3ML) 0.083% nebulizer solution Take 3 mLs (2.5 mg total) by nebulization every 6 (six) hours as needed  for wheezing or shortness of breath. 75 mL 0  . diltiazem (CARDIZEM) 30 MG tablet Take 1 tablet (30 mg total) by mouth every 12 (twelve) hours. 60 tablet 0  . famotidine (PEPCID) 20 MG tablet Take 20 mg by mouth at bedtime.     . hydrochlorothiazide (HYDRODIURIL) 25 MG tablet take 1 tablet by mouth once daily 30 tablet 7  . nicotine (NICODERM CQ - DOSED IN MG/24 HOURS) 21 mg/24hr patch Place 21 mg onto the skin daily.    Marland Kitchen olmesartan (BENICAR) 20 MG tablet Take 1 tablet (20 mg total) by mouth daily. 30 tablet 2  . omeprazole (PRILOSEC) 20 MG capsule Take 20 mg by mouth every morning.    Alveda Reasons 20 MG TABS tablet TAKE 1 TABLET BY MOUTH ONCE DAILY WITH SUPPER 30 tablet 2   No current facility-administered medications for this visit.     Allergies:   Patient has no known allergies.   Social History:  The patient  reports that he quit smoking about 3 weeks ago. His smoking use included cigarettes. He has a 120.00 pack-year smoking history. he has never used smokeless tobacco. He reports that he does not drink alcohol or use drugs.   Family History:  The patient's family history includes COPD in his brother; Diabetes in his mother; Heart disease in his brother.   ROS:  Please see the history of present illness.   Otherwise, review of systems is positive for appetite change, fatigue, cough,  shortness of breath, wheezing, easy bruising, headaches.   All other systems are reviewed and negative.   PHYSICAL EXAM: VS:  Ht 5\' 6"  (1.676 m)   Wt 220 lb 9.6 oz (100.1 kg)   BMI 35.61 kg/m  , BMI Body mass index is 35.61 kg/m. GEN: Well nourished, well developed, in no acute distress  HEENT: normal  Neck: no JVD, carotid bruits, or masses Cardiac: RRR; no murmurs, rubs, or gallops,no edema  Respiratory:  clear to auscultation bilaterally, normal work of breathing GI: soft, nontender, nondistended, + BS MS: no deformity or atrophy  Skin: warm and dry Neuro:  Strength and sensation are  intact Psych: euthymic mood, full affect  EKG:  EKG is not ordered today. Personal review of the ekg ordered 06/13/17 shows sinus rhythm, rate 92, iRBBB, LAFB   Recent Labs: 06/13/2017: B Natriuretic Peptide 76.4; TSH 0.207 06/14/2017: Hemoglobin 14.2; Platelets 183 06/17/2017: BUN 32; Creatinine, Ser 0.89; Potassium 4.0; Sodium 137    Lipid Panel  No results found for: CHOL, TRIG, HDL, CHOLHDL, VLDL, LDLCALC, LDLDIRECT   Wt Readings from Last 3 Encounters:  07/10/17 220 lb 9.6 oz (100.1 kg)  07/02/17 223 lb (101.2 kg)  06/19/17 223 lb 6.4 oz (101.3 kg)      Other studies Reviewed: Additional studies/ records that were reviewed today include: Spect 06/25/15  Review of the above records today demonstrates:   The left ventricular ejection fraction is normal (55-65%).  Nuclear stress EF: 57%.  There was no ST segment deviation noted during stress.  The study is normal.  Normal stress nuclear study with no ischemia or infarction; EF 57 with normal wall motion.  Tele monitor 06/17/14 Monitor shows NSVT up 16 beat runs and periods of ventricular escape rhythm in the 30's.   TTE 08/17/15 - Left ventricle: The cavity size was normal. Wall thickness was   increased in a pattern of mild LVH. Systolic function was   vigorous. The estimated ejection fraction was in the range of 65%   to 70%. Indeterminant diastolic function. Although no diagnostic   regional wall motion abnormality was identified, this possibility   cannot be completely excluded on the basis of this study. - Aortic valve: There was trivial regurgitation. - Aorta: Mildly dilated ascending aorta. Ascending aortic diameter:   38 mm (S). - Mitral valve: Mildly calcified annulus. There was no significant   regurgitation. - Right ventricle: The cavity size was normal. Systolic function   was normal. - Pulmonary arteries: No complete TR doppler jet so unable to   estimate PA systolic pressure. - Systemic veins: IVC  measured 1.8 cm with < 50% respirophasic   variation, suggesting RA pressure 8 mmHg.  ASSESSMENT AND PLAN:  1.  NSVT: No further episodes.  No episodes of palpitations.  No changes.  2. Bradycardia: Has not occurred with waking hours.  3. Syncope: Has had no further episodes.  4. Paroxysmal atrial fibrillation: Currently on Xarelto.  Is remained in sinus rhythm.  Was put on diltiazem in the hospital.  We Zerina Hallinan switch him to 120 mg of Cardizem.  This patients CHA2DS2-VASc Score and unadjusted Ischemic Stroke Rate (% per year) is equal to 4.8 % stroke rate/year from a score of 4  Above score calculated as 1 point each if present [CHF, HTN, DM, Vascular=MI/PAD/Aortic Plaque, Age if 65-74, or Male] Above score calculated as 2 points each if present [Age > 75, or Stroke/TIA/TE]   5. Hypertension: Blood pressure well controlled.  Was switched  from lisinopril to olmesartan at his last visit.  No changes.   Current medicines are reviewed at length with the patient today.   The patient does not have concerns regarding his medicines.  The following changes were made today: Start Cardizem  Labs/ tests ordered today include:  No orders of the defined types were placed in this encounter.    Disposition:   FU with Treson Laura 3 months  Signed, Brissia Delisa [name], MD  07/10/2017 8:40 AM     [facility] [address] [place] [address] [phone] (office) [phone] (fax)

## 2017-07-10 NOTE — Telephone Encounter (Signed)
Derrick Alexander is calling because she has a question about the medication change. Wants to know if he starts the new medicine (Diltiazem) tonight .

## 2017-07-10 NOTE — Telephone Encounter (Signed)
Advised to start tonight (pt prefers to take the extended release at dinner/night) or tomorrow - -whatever they prefer. Informed to take 30 mg dose tonight if he doesn't start the ER form till tomorrow. Wife verbalized understanding and agreeable to plan.

## 2017-07-10 NOTE — Patient Instructions (Addendum)
Medication Instructions:  Your physician has recommended you make the following change in your medication: 1. STOP Diltiazem 30 mg twice daily 2. START Diltiazem 120 mg once daily  * If you need a refill on your cardiac medications before your next appointment, please call your pharmacy. *  Labwork: None ordered  Testing/Procedures: None ordered  Follow-Up: Your physician recommends that you schedule a follow-up appointment in: 3 months with Dr. Curt Bears.  Thank you for choosing [facility]!!   Derrick Curet, RN [phone]  Any Other Special Instructions Will Be Listed Below (If Applicable).  Diltiazem extended-release capsules or tablets What is this medicine? DILTIAZEM (dil TYE a zem) is a calcium-channel blocker. It affects the amount of calcium found in your heart and muscle cells. This relaxes your blood vessels, which can reduce the amount of work the heart has to do. This medicine is used to treat high blood pressure and chest pain caused by angina. This medicine may be used for other purposes; ask your health care provider or pharmacist if you have questions. COMMON BRAND NAME(S): Cardizem CD, Cardizem LA, Cardizem SR, Cartia XT, Dilacor XR, Dilt-CD, Diltia XT, Diltzac, Matzim LA, Rema Fendt, Tiamate, Tiazac What should I tell my health care provider before I take this medicine? They need to know if you have any of these conditions: -heart problems, low blood pressure, irregular heartbeat -liver disease -previous heart attack -an unusual or allergic reaction to diltiazem, other medicines, foods, dyes, or preservatives -pregnant or trying to get pregnant -breast-feeding How should I use this medicine? Take this medicine by mouth with a glass of water. Follow the directions on the prescription label. Swallow whole, do not crush or chew. Ask your doctor or pharmacist if your should take this medicine with food. Take your doses at regular intervals. Do not take your  medicine more often then directed. Do not stop taking except on the advice of your doctor or health care professional. Ask your doctor or health care professional how to gradually reduce the dose. Talk to your pediatrician regarding the use of this medicine in children. Special care may be needed. Overdosage: If you think you have taken too much of this medicine contact a poison control center or emergency room at once. NOTE: This medicine is only for you. Do not share this medicine with others. What if I miss a dose? If you miss a dose, take it as soon as you can. If it is almost time for your next dose, take only that dose. Do not take double or extra doses. What may interact with this medicine? Do not take this medicine with any of the following medications: -cisapride -hawthorn -pimozide -ranolazine -red yeast rice This medicine may also interact with the following medications: -buspirone -carbamazepine -cimetidine -cyclosporine -digoxin -local anesthetics or general anesthetics -lovastatin -medicines for anxiety or difficulty sleeping like midazolam and triazolam -medicines for high blood pressure or heart problems -quinidine -rifampin, rifabutin, or rifapentine This list may not describe all possible interactions. Give your health care provider a list of all the medicines, herbs, non-prescription drugs, or dietary supplements you use. Also tell them if you smoke, drink alcohol, or use illegal drugs. Some items may interact with your medicine. What should I watch for while using this medicine? Check your blood pressure and pulse rate regularly. Ask your doctor or health care professional what your blood pressure and pulse rate should be and when you should contact him or her. You may feel dizzy or lightheaded. Do  not drive, use machinery, or do anything that needs mental alertness until you know how this medicine affects you. To reduce the risk of dizzy or fainting spells, do not sit  or stand up quickly, especially if you are an older patient. Alcohol can make you more dizzy or increase flushing and rapid heartbeats. Avoid alcoholic drinks. What side effects may I notice from receiving this medicine? Side effects that you should report to your doctor or health care professional as soon as possible: -allergic reactions like skin rash, itching or hives, swelling of the face, lips, or tongue -confusion, mental depression -feeling faint or lightheaded, falls -redness, blistering, peeling or loosening of the skin, including inside the mouth -slow, irregular heartbeat -swelling of the feet and ankles -unusual bleeding or bruising, pinpoint red spots on the skin Side effects that usually do not require medical attention (report to your doctor or health care professional if they continue or are bothersome): -constipation or diarrhea -difficulty sleeping -facial flushing -headache -nausea, vomiting -sexual dysfunction -weak or tired This list may not describe all possible side effects. Call your doctor for medical advice about side effects. You may report side effects to FDA at 1-800-FDA-1088. Where should I keep my medicine? Keep out of the reach of children. Store at room temperature between 15 and 30 degrees C (59 and 86 degrees F). Protect from humidity. Throw away any unused medicine after the expiration date. NOTE: This sheet is a summary. It may not cover all possible information. If you have questions about this medicine, talk to your doctor, pharmacist, or health care provider.  2018 Elsevier/Gold Standard (2007-08-29 14:35:47)

## 2017-07-12 DIAGNOSIS — B370 Candidal stomatitis: Secondary | ICD-10-CM | POA: Diagnosis not present

## 2017-07-12 DIAGNOSIS — J449 Chronic obstructive pulmonary disease, unspecified: Secondary | ICD-10-CM | POA: Diagnosis not present

## 2017-07-12 DIAGNOSIS — I10 Essential (primary) hypertension: Secondary | ICD-10-CM | POA: Diagnosis not present

## 2017-07-12 DIAGNOSIS — R490 Dysphonia: Secondary | ICD-10-CM | POA: Diagnosis not present

## 2017-08-01 ENCOUNTER — Ambulatory Visit (INDEPENDENT_AMBULATORY_CARE_PROVIDER_SITE_OTHER): Payer: Medicare Other | Admitting: Internal Medicine

## 2017-08-01 ENCOUNTER — Encounter: Payer: Self-pay | Admitting: Internal Medicine

## 2017-08-01 ENCOUNTER — Other Ambulatory Visit (INDEPENDENT_AMBULATORY_CARE_PROVIDER_SITE_OTHER): Payer: Medicare Other

## 2017-08-01 ENCOUNTER — Ambulatory Visit: Payer: Medicare Other | Admitting: Internal Medicine

## 2017-08-01 VITALS — BP 112/60 | HR 55 | Ht 63.0 in | Wt 221.0 lb

## 2017-08-01 DIAGNOSIS — R0609 Other forms of dyspnea: Secondary | ICD-10-CM | POA: Diagnosis not present

## 2017-08-01 DIAGNOSIS — R058 Other specified cough: Secondary | ICD-10-CM

## 2017-08-01 DIAGNOSIS — R05 Cough: Secondary | ICD-10-CM

## 2017-08-01 DIAGNOSIS — I10 Essential (primary) hypertension: Secondary | ICD-10-CM | POA: Diagnosis not present

## 2017-08-01 DIAGNOSIS — R059 Cough, unspecified: Secondary | ICD-10-CM

## 2017-08-01 LAB — CBC WITH DIFFERENTIAL/PLATELET
BASOS PCT: 0.9 % (ref 0.0–3.0)
Basophils Absolute: 0.1 10*3/uL (ref 0.0–0.1)
EOS PCT: 0.9 % (ref 0.0–5.0)
Eosinophils Absolute: 0.1 10*3/uL (ref 0.0–0.7)
HEMATOCRIT: 41 % (ref 39.0–52.0)
HEMOGLOBIN: 14.4 g/dL (ref 13.0–17.0)
LYMPHS PCT: 25.6 % (ref 12.0–46.0)
Lymphs Abs: 2.3 10*3/uL (ref 0.7–4.0)
MCHC: 35 g/dL (ref 30.0–36.0)
MCV: 85.1 fl (ref 78.0–100.0)
MONO ABS: 1 10*3/uL (ref 0.1–1.0)
Monocytes Relative: 11.2 % (ref 3.0–12.0)
Neutro Abs: 5.5 10*3/uL (ref 1.4–7.7)
Neutrophils Relative %: 61.4 % (ref 43.0–77.0)
Platelets: 161 10*3/uL (ref 150.0–400.0)
RBC: 4.82 Mil/uL (ref 4.22–5.81)
RDW: 14.7 % (ref 11.5–15.5)
WBC: 8.9 10*3/uL (ref 4.0–10.5)

## 2017-08-01 LAB — PULMONARY FUNCTION TEST
DL/VA % PRED: 117 %
DL/VA: 4.8 ml/min/mmHg/L
DLCO UNC: 23.19 ml/min/mmHg
DLCO unc % pred: 101 %
FEF 25-75 POST: 1.18 L/s
FEF 25-75 Pre: 0.99 L/sec
FEF2575-%Change-Post: 19 %
FEF2575-%PRED-PRE: 57 %
FEF2575-%Pred-Post: 68 %
FEV1-%Change-Post: 1 %
FEV1-%PRED-POST: 75 %
FEV1-%Pred-Pre: 74 %
FEV1-PRE: 1.7 L
FEV1-Post: 1.73 L
FEV1FVC-%Change-Post: 1 %
FEV1FVC-%PRED-PRE: 97 %
FEV6-%CHANGE-POST: 0 %
FEV6-%PRED-PRE: 80 %
FEV6-%Pred-Post: 80 %
FEV6-POST: 2.38 L
FEV6-Pre: 2.38 L
FEV6FVC-%Change-Post: 0 %
FEV6FVC-%PRED-POST: 107 %
FEV6FVC-%Pred-Pre: 107 %
FVC-%Change-Post: 0 %
FVC-%PRED-PRE: 74 %
FVC-%Pred-Post: 74 %
FVC-PRE: 2.39 L
FVC-Post: 2.38 L
POST FEV6/FVC RATIO: 100 %
Post FEV1/FVC ratio: 73 %
Pre FEV1/FVC ratio: 71 %
Pre FEV6/FVC Ratio: 100 %
RV % pred: 150 %
RV: 3.19 L
TLC % PRED: 103 %
TLC: 5.84 L

## 2017-08-01 MED ORDER — BENZONATATE 200 MG PO CAPS: 200.0000 mg | ORAL_CAPSULE | Freq: Three times a day (TID) | ORAL | 1 refills | Status: DC | PRN

## 2017-08-01 NOTE — Progress Notes (Signed)
PFT completed today 08/01/17  

## 2017-08-01 NOTE — Patient Instructions (Addendum)
For cough > tessalon 200 mg every 8 hours as needed and or mucinex dm up to 1200 mg every 12 hours and the cough should gradually resolve - once it does it's ok also to stop the acid suppression   Please see patient coordinator before you leave today  to schedule sinus CT    Please remember to go to the lab  department downstairs in the basement  for your tests - we will call you with the results when they are available.       If you are satisfied with your treatment plan,  let your doctor know and he/she can either refill your medications or you can return here when your prescription runs out.     If in any way you are not 100% satisfied,  please tell us.  If 100% better, tell your friends!  Pulmonary follow up is as needed

## 2017-08-01 NOTE — Progress Notes (Signed)
Subjective:     Patient ID: Derrick Alexander, male   DOB: 1946/01/18,   MRN: [id number]    Brief patient profile:  72 yowm quit smoking 06/13/17 with chronic raspy breathing  Per wife and no airflow obst on pfts 08/01/2017  admit to [facility] :  Admit date: 06/13/2017 Discharge date: 06/19/2017   Recommendations for Outpatient Follow-up:  1. Follow up with PCP has been scheduled by the patient's wife for 2/1. Consider further evaluation of pulmonary function and formal sleep study.  2. Follow up with cardiology, monitor HR/BP (started on diltiazem in place of norvasc for rapid AFib)     Brief/Interim Summary: Derrick Alexander an 72 y.o.malewith a PMH of significant tobacco abuse, suspected COPD, HTN, PAF on xarelto, and obesity who was admitted 06/13/17 for evaluation of progressive shortness of breath associated with cough and increased sputum production. Upon initial evaluation, he was found to be in respiratory distress with rapid A. fib. He was treated for presumptive COPD exacerbation with continuous nebulizer treatments and Solu-Medrol. His respiratory status deteriorated and he required BiPAP, continuedto require this intermittently. Initial attempt at weaning to prednisone was met with deterioration, return to BiPAP, so IV steroids werecontinued through 1/28 when they were tapered to prednisone with sustained improvement.     Discharge Diagnoses:  Principal Problem:   Acute respiratory distress Active Problems:   Atrial fibrillation (HCC)   Dyspnea   Paroxysmal atrial fibrillation with RVR (H[facility])   COPD with exacerbation ([facility])   Obesity   Tobacco use   Essential hypertension   Low TSH level  COPD with exacerbation/Acute respiratory distress:  - Continue nebulizers at home, DME nebulizer arranged prior to DC. - Completed azithromycin x5 days - Continue prednisone - Flutter valve, OOB.  - Recommend outpatient sleep study and PFTs.    Paroxysmal atrial fibrillation with RVR,  History of NSVT and syncope: Pt of Dr. Curt Alexander. - Continue rate control with diltiazem - Continue xarelto for CHA2DS2-VASc of 4.   Tobacco use:  - Cessation counseling provided - Nicotine patch ordered.   Obesity: Body mass index is 35.59 kg/m. - Counseled for weight loss in effort to improve what may be sleep apnea.   Low TSH level: Free T4 is normal, therefore recommend rechecking as clinically indicated.   Hypertension:  - Continue HCTZ, lisinopril, holding norvasc w/new diltiazem.  Hyperkalemia: ?hemolysis.Resolved.  Acute kidney injury: Resolved.      07/02/2017 1st [facility] office visit/ Derrick Alexander   Chief Complaint  Patient presents with  . Pulmonary Consult    Referred by Dr. [place] Alexander. Pt states he was admitted to the hospital after becoming SOB from 06/13/17 until 06/19/17.  He states he feels SOB "about all the time" with or without any exertion. His voice has been raspy "sounds like a squeaky toy".  He has a prod cough with clear sputum. He uses albuterol nebs 2-3 x per day on average.   he had h/o chronic raspy breathing "for a long time" per his wife prior to admit but pt had minimal doe including at yard work fall on 2018 but acutely ill w/in a week PTA > resp distress on admit and really not able to  Give hx / placed on bipap and rx as copd exac but says never really improved sob / severe hacking cough even on pred and round the clock nebs.  Sob at rest and immediately supine "like choking" assoc with sore throat and dysphagia/ nasal congestion and mild hb rec Stop lisinopril  and start benicar (olmesartan) 20 mg one daily and can take it twice daily if needed  Start  Try prilosec otc 78m  Take 30-60 min before first meal of the day and Pepcid ac (famotidine) 20 mg one @  bedtime until cough is completely gone for at least a week without the need for cough suppression GERD  As needed for shortness of breath ok to use nebulizer up to every 4 hours as needed   Please schedule a follow up office visit in 4 weeks, sooner if needed full pfts on return and all active medications   08/01/2017  f/u ov/Derrick Alexander re:  Chronic cough/ no copd on pfts 08/01/2017  Overall better off ACEi x one month Chief Complaint  Patient presents with  . Follow-up    PFT's done today. Breathing is doing well and he has not had to use his albuterol.  He has cough with light brown sputum for the past wk.     Dyspnea:  Not limited by breathing from desired activities  Limited by fatigue Cough: better but still need to clear throat daytime only , no choking Sleep: like nl = flat or side  SABA use:  None for a month   No obvious day to day or daytime variability or assoc excess/ purulent sputum or mucus plugs or hemoptysis or cp or chest tightness, subjective wheeze or overt sinus or hb symptoms. No unusual exposure hx or h/o childhood pna/ asthma or knowledge of premature birth.  Sleeping ok flat without nocturnal  or early am exacerbation  of respiratory  c/o's or need for noct saba. Also denies any obvious fluctuation of symptoms with weather or environmental changes or other aggravating or alleviating factors except as outlined above   Current Allergies, Complete Past Medical History, Past Surgical History, Family History, and Social History were reviewed in [employer].  ROS  The following are not active complaints unless bolded Hoarseness, sore throat, dysphagia, dental problems, itching, sneezing,  nasal congestion or discharge of excess mucus or purulent secretions, ear ache,   fever, chills, sweats, unintended wt loss or wt gain, classically pleuritic or exertional cp,  orthopnea pnd or leg swelling, presyncope, palpitations, abdominal pain, anorexia, nausea, vomiting, diarrhea  or change in bowel habits or change in bladder habits, change in stools or change in urine, dysuria, hematuria,  rash, arthralgias, visual complaints, headache, numbness,  weakness or ataxia or problems with walking or coordination,  change in mood/affect or memory.        Current Meds  Medication Sig  . albuterol (PROVENTIL) (2.5 MG/3ML) 0.083% nebulizer solution Take 3 mLs (2.5 mg total) by nebulization every 6 (six) hours as needed for wheezing or shortness of breath.  . diltiazem (CARDIZEM CD) 120 MG 24 hr capsule Take 1 capsule (120 mg total) by mouth daily.  . famotidine (PEPCID) 20 MG tablet Take 20 mg by mouth at bedtime.   . hydrochlorothiazide (HYDRODIURIL) 25 MG tablet take 1 tablet by mouth once daily  . nicotine (NICODERM CQ - DOSED IN MG/24 HOURS) 21 mg/24hr patch Place 21 mg onto the skin daily.  .Marland Kitchenolmesartan (BENICAR) 20 MG tablet Take 1 tablet (20 mg total) by mouth daily.  .Marland Kitchenomeprazole (PRILOSEC) 20 MG capsule Take 20 mg by mouth every morning.  .Alveda Reasons20 MG TABS tablet TAKE 1 TABLET BY MOUTH ONCE DAILY WITH SUPPER  Objective:   Physical Exam   amb wm nad   08/01/2017       221   07/02/17 223 lb (101.2 kg)  06/19/17 223 lb 6.4 oz (101.3 kg)  05/09/17 228 lb 3.2 oz (103.5 kg)    Vital signs reviewed - Note on arrival 02 sats 97% RA      HEENT: nl dentition, turbinates bilaterally, and oropharynx. Nl external ear canals without cough reflex Modified Mallampati Score =   4   NECK :  without JVD/Nodes/TM/ nl carotid upstrokes bilaterally   LUNGS: no acc muscle use,  Nl contour chest which is clear to A and P bilaterally without cough on insp or exp maneuvers   CV:  RRR  no s3 or murmur or increase in P2, and no edema   ABD:  soft and nontender with nl inspiratory excursion in the supine position. No bruits or organomegaly appreciated, bowel sounds nl  MS:  Nl gait/ ext warm without deformities, calf tenderness, cyanosis or clubbing No obvious joint restrictions   SKIN: warm and dry without lesions    NEURO:  alert, approp, nl sensorium with  no motor or cerebellar deficits apparent.              Assessment:

## 2017-08-02 LAB — RESPIRATORY ALLERGY PROFILE REGION II ~~LOC~~
Allergen, Cedar tree, t12: <0.1 kU/L
Allergen, Comm Silver Birch, t9: <0.1 kU/L
Allergen, Cottonwood, t14: <0.1 kU/L
Allergen, D pternoyssinus,d7: <0.1 kU/L
Allergen, Mulberry, t76: <0.1 kU/L
Allergen, Oak,t7: <0.1 kU/L
Aspergillus fumigatus, m3: <0.1 kU/L
Bermuda Grass: <0.1 kU/L
Box Elder IgE: <0.1 kU/L
CLASS: 0
CLASS: 0
CLASS: 0
CLASS: 0
CLASS: 0
CLASS: 0
CLASS: 0
CLASS: 0
CLASS: 0
CLASS: 0
CLASS: 0
CLASS: 0
CLASS: 0
Class: 0
Class: 0
Class: 0
Class: 0
Class: 0
Class: 0
Class: 0
Class: 0
Class: 0
Class: 0
Class: 0
Cockroach: <0.1 kU/L
Dog Dander: <0.1 kU/L
Elm IgE: <0.1 kU/L
IGE (IMMUNOGLOBULIN E), SERUM: 7 kU/L (ref <=114)
Rough Pigweed  IgE: <0.1 kU/L
Sheep Sorrel IgE: <0.1 kU/L
Timothy Grass: <0.1 kU/L

## 2017-08-02 LAB — INTERPRETATION:

## 2017-08-03 ENCOUNTER — Ambulatory Visit: Payer: Medicare Other | Admitting: Internal Medicine

## 2017-08-03 NOTE — Progress Notes (Signed)
Spouse made aware and she verbalized understanding and states will inform the pt

## 2017-08-06 ENCOUNTER — Ambulatory Visit (INDEPENDENT_AMBULATORY_CARE_PROVIDER_SITE_OTHER)
Admission: RE | Admit: 2017-08-06 | Discharge: 2017-08-06 | Disposition: A | Discharge status: Home / Self Care | Payer: Medicare Other | Source: Ambulatory Visit | Attending: Internal Medicine | Admitting: Internal Medicine

## 2017-08-06 DIAGNOSIS — R059 Cough, unspecified: Secondary | ICD-10-CM

## 2017-08-06 DIAGNOSIS — R05 Cough: Secondary | ICD-10-CM | POA: Diagnosis not present

## 2017-08-06 DIAGNOSIS — R058 Other specified cough: Secondary | ICD-10-CM

## 2017-08-06 DIAGNOSIS — R0982 Postnasal drip: Secondary | ICD-10-CM | POA: Diagnosis not present

## 2017-08-07 ENCOUNTER — Telehealth: Payer: Self-pay | Admitting: Internal Medicine

## 2017-08-07 ENCOUNTER — Encounter: Payer: Self-pay | Admitting: Internal Medicine

## 2017-08-07 NOTE — Telephone Encounter (Signed)
Patient aware of results nothing further needed. 

## 2017-08-07 NOTE — Assessment & Plan Note (Signed)
Adequate control on present rx, reviewed in detail with pt > no change in rx needed    lthough even in retrospect it may not be clear the ACEi contributed to the pt's symptoms,  Pt improved off them and adding them back at this point or in the future would risk confusion in interpretation of non-specific respiratory symptoms to which this patient is prone  ie  Better not to muddy the waters here.   rec remain off acei indefinitely > Follow up per Primary Care planned

## 2017-08-07 NOTE — Assessment & Plan Note (Addendum)
Allergy profile 08/01/2017 >  Eos 0.1/  IgE  7 RAST  Neg - sinus CT  08/06/17 nl   Better off acei but not yet out 6 weeks and also on gerd rx which he should probably continue x 2 more months then taper and see if symptoms flare and if so consider GI f/u next  Discussed the recent press about ppi's in the context of a statistically significant (but questionably clinically relevant) increase in CRI in pts on ppi vs h2's > bottom line is the lowest dose of ppi that controls   gerd is the right dose and if that dose is zero that's fine esp since h2's are cheaper but would not attempt transition until the upper airway cough is better and if it's not p  2 additional months  rx  then ENT eval next step    I had an extended discussion with the patient reviewing all relevant studies completed to date and  lasting 15 to 20 minutes of a 25 minute final summary office visit    Each maintenance medication was reviewed in detail including most importantly the difference between maintenance and prns and under what circumstances the prns are to be triggered using an action plan format that is not reflected in the computer generated alphabetically organized AVS.    Please see AVS for specific instructions unique to this visit that I personally wrote and verbalized to the the pt in detail and then reviewed with pt  by my nurse highlighting any  changes in therapy recommended at today's visit to their plan of care.

## 2017-08-07 NOTE — Assessment & Plan Note (Signed)
Try off acei and on gerd rx 07/02/2017 >>> improved 08/01/2017  - Allergy profile 08/01/17 >  Eos 0.1 /  IgE  7 neg RAST - PFT's  08/01/2017  FEV1 1.73 (75 % ) ratio 73  p 1 % improvement from saba p nothing prior to study with DLCO  101 % corrects to 117 % for alv volume    No longer sob/ cough / choking improving off acei with no evidence of copd at all so no need for pulmonary rx as long as remains free from cigs and pulmoary f/u can be prn  .

## 2017-08-23 ENCOUNTER — Other Ambulatory Visit: Payer: Self-pay | Admitting: Cardiology

## 2017-08-29 ENCOUNTER — Telehealth: Payer: Self-pay | Admitting: Cardiology

## 2017-08-29 ENCOUNTER — Other Ambulatory Visit: Payer: Self-pay

## 2017-08-29 MED ORDER — TELMISARTAN 20 MG PO TABS: 20.0000 mg | ORAL_TABLET | Freq: Every day | ORAL | 3 refills | Status: DC

## 2017-08-29 NOTE — Telephone Encounter (Signed)
Patient calling because he is on last 2 pills of Olmesartan and the medication is on backorder..   I spoke to [facility] (pharmacist) and she recommended Telmisartan 20 mg daily..  I informed patient of medication change and he verbalized understanding.Derrick Alexander

## 2017-08-29 NOTE — Telephone Encounter (Signed)
Pt c/o medication issue:  1. Name of Medication: Olmesartan  2. How are you currently taking this medication (dosage and times per day)? 50 mg// 1 x daily  3. Are you having a reaction (difficulty breathing--STAT)? no  4. What is your medication issue? Pharmacy could not refill medication because medication is on back order. Patient has two pills left and would like to know what to do next?

## 2017-10-03 ENCOUNTER — Other Ambulatory Visit: Payer: Self-pay | Admitting: Cardiology

## 2017-10-10 ENCOUNTER — Encounter: Payer: Self-pay | Admitting: Cardiology

## 2017-10-10 ENCOUNTER — Other Ambulatory Visit: Payer: Self-pay

## 2017-10-10 ENCOUNTER — Ambulatory Visit: Payer: Medicare Other | Admitting: Cardiology

## 2017-10-10 VITALS — BP 163/82 | HR 67 | Ht 65.0 in | Wt 231.4 lb

## 2017-10-10 DIAGNOSIS — I472 Ventricular tachycardia, unspecified: Secondary | ICD-10-CM

## 2017-10-10 DIAGNOSIS — I10 Essential (primary) hypertension: Secondary | ICD-10-CM

## 2017-10-10 DIAGNOSIS — R55 Syncope and collapse: Secondary | ICD-10-CM

## 2017-10-10 DIAGNOSIS — I480 Paroxysmal atrial fibrillation: Secondary | ICD-10-CM | POA: Diagnosis not present

## 2017-10-10 MED ORDER — TELMISARTAN 40 MG PO TABS: 40.0000 mg | ORAL_TABLET | Freq: Every day | ORAL | 6 refills | Status: DC

## 2017-10-10 NOTE — Progress Notes (Signed)
Electrophysiology Office Note   Date:  10/10/2017   ID:  Derrick Alexander, DOB 10-03-1945, MRN [id number]  PCP:  [name], MD  Cardiologist:  Debara Pickett Primary Electrophysiologist:  Will [name], MD    Chief Complaint  Patient presents with  . Atrial Fibrillation     History of Present Illness: Derrick Alexander is a 72 y.o. male who presents today for electrophysiology evaluation.  He has a history of hypertension, left bundle branch block, tobacco abuse with a 60-pack-year smoking history, and nonsustained VT.  He also has paroxysmal atrial fibrillation.  He had an episode of syncope and wore a cardiac monitor that showed no obvious causes for syncope but a 16 beat run of nonsustained VT.  He also had bradycardia into the 30s.  Today, denies symptoms of palpitations, chest pain, shortness of breath, orthopnea, PND, lower extremity edema, claudication, dizziness, presyncope, syncope, bleeding, or neurologic sequela. The patient is tolerating medications without difficulties.  Overall he is feeling well.  He has no palpitations chest pain or shortness of breath.  He does note an increase in his blood pressure since switching to telmisartan.  He is on a low dose of the medication.   Past Medical History:  Diagnosis Date  . A-fib ([place])   . Abnormal EKG 06/18/2015  . COPD (chronic obstructive pulmonary disease) ([place])   . COPD with exacerbation ([place])   . Faintness 06/18/2015  . Hypertension   . LBBB (left bundle branch block) 06/18/2015  . Obesity   . Syncope 08/06/2015  . Tobacco use   . V-tach ([place]) 08/06/2015   History reviewed. No pertinent surgical history.   Current Outpatient Medications  Medication Sig Dispense Refill  . albuterol (PROVENTIL) (2.5 MG/3ML) 0.083% nebulizer solution Take 3 mLs (2.5 mg total) by nebulization every 6 (six) hours as needed for wheezing or shortness of breath. 75 mL 0  . benzonatate (TESSALON) 200 MG capsule Take 1 capsule (200 mg total) by mouth  3 (three) times daily as needed for cough. 30 capsule 1  . famotidine (PEPCID) 20 MG tablet Take 20 mg by mouth at bedtime.     . hydrochlorothiazide (HYDRODIURIL) 25 MG tablet TAKE 1 TABLET BY MOUTH DAILY 30 tablet 10  . nicotine (NICODERM CQ - DOSED IN MG/24 HOURS) 21 mg/24hr patch Place 21 mg onto the skin daily.    Marland Kitchen omeprazole (PRILOSEC) 20 MG capsule Take 20 mg by mouth every morning.    Alveda Reasons 20 MG TABS tablet TAKE 1 TABLET BY MOUTH ONCE DAILY WITH SUPPER 30 tablet 5  . diltiazem (CARDIZEM CD) 120 MG 24 hr capsule Take 1 capsule (120 mg total) by mouth daily. 30 capsule 3  . telmisartan (MICARDIS) 40 MG tablet Take 1 tablet (40 mg total) by mouth daily. 30 tablet 6   No current facility-administered medications for this visit.     Allergies:   Patient has no known allergies.   Social History:  The patient  reports that he quit smoking about 3 months ago. His smoking use included cigarettes. He has a 120.00 pack-year smoking history. He has never used smokeless tobacco. He reports that he does not drink alcohol or use drugs.   Family History:  The patient's family history includes COPD in his brother; Diabetes in his mother; Heart disease in his brother.   ROS:  Please see the history of present illness.   Otherwise, review of systems is positive for leg swelling, shortness of breath, easy bruising.  All other systems are reviewed and negative.   PHYSICAL EXAM: VS:  BP (!) 163/82   Pulse 67   Ht 5\' 5"  (1.651 m)   Wt 231 lb 6.4 oz (105 kg)   BMI 38.51 kg/m  , BMI Body mass index is 38.51 kg/m. GEN: Well nourished, well developed, in no acute distress  HEENT: normal  Neck: no JVD, carotid bruits, or masses Cardiac: RRR; no murmurs, rubs, or gallops,no edema  Respiratory:  clear to auscultation bilaterally, normal work of breathing GI: soft, nontender, nondistended, + BS MS: no deformity or atrophy  Skin: warm and dry Neuro:  Strength and sensation are intact Psych:  euthymic mood, full affect  EKG:  EKG is ordered today. Personal review of the ekg ordered shows sinus rhythm, left anterior fascicular block, nonspecific T wave abnormalities  Recent Labs: 06/13/2017: B Natriuretic Peptide 76.4; TSH 0.207 06/17/2017: BUN 32; Creatinine, Ser 0.89; Potassium 4.0; Sodium 137 08/01/2017: Hemoglobin 14.4; Platelets 161.0    Lipid Panel  No results found for: CHOL, TRIG, HDL, CHOLHDL, VLDL, LDLCALC, LDLDIRECT   Wt Readings from Last 3 Encounters:  10/10/17 231 lb 6.4 oz (105 kg)  08/01/17 221 lb (100.2 kg)  07/10/17 220 lb 9.6 oz (100.1 kg)      Other studies Reviewed: Additional studies/ records that were reviewed today include: Spect 06/25/15  Review of the above records today demonstrates:   The left ventricular ejection fraction is normal (55-65%).  Nuclear stress EF: 57%.  There was no ST segment deviation noted during stress.  The study is normal.  Normal stress nuclear study with no ischemia or infarction; EF 57 with normal wall motion.  Tele monitor 06/17/14 Monitor shows NSVT up 16 beat runs and periods of ventricular escape rhythm in the 30's.   TTE 08/17/15 - Left ventricle: The cavity size was normal. Wall thickness was   increased in a pattern of mild LVH. Systolic function was   vigorous. The estimated ejection fraction was in the range of 65%   to 70%. Indeterminant diastolic function. Although no diagnostic   regional wall motion abnormality was identified, this possibility   cannot be completely excluded on the basis of this study. - Aortic valve: There was trivial regurgitation. - Aorta: Mildly dilated ascending aorta. Ascending aortic diameter:   38 mm (S). - Mitral valve: Mildly calcified annulus. There was no significant   regurgitation. - Right ventricle: The cavity size was normal. Systolic function   was normal. - Pulmonary arteries: No complete TR doppler jet so unable to   estimate PA systolic pressure. -  Systemic veins: IVC measured 1.8 cm with < 50% respirophasic   variation, suggesting RA pressure 8 mmHg.  ASSESSMENT AND PLAN:  1.  NSVT: No further episodes.  No palpitations.  No changes.  2. Bradycardia: Has not noted any further bradycardia.  No changes.  3. Syncope: No further episodes.  4. Paroxysmal atrial fibrillation: Currently on Xarelto and Cardizem.  No further episodes of atrial fibrillation.  No changes.  This patients CHA2DS2-VASc Score and unadjusted Ischemic Stroke Rate (% per year) is equal to 4.8 % stroke rate/year from a score of 4  Above score calculated as 1 point each if present [CHF, HTN, DM, Vascular=MI/PAD/Aortic Plaque, Age if 65-74, or Male] Above score calculated as 2 points each if present [Age > 75, or Stroke/TIA/TE]   5. Hypertension: Blood pressure is elevated today.  He was recently switched to telmisartan.  Will increase the dose to 40  mg and touch base with him in a few weeks to see if his blood pressure remains high.  If so, he would benefit from an increase to 80 mg.   Current medicines are reviewed at length with the patient today.   The patient does not have concerns regarding his medicines.  The following changes were made today: Increase telmisartan  Labs/ tests ordered today include:  No orders of the defined types were placed in this encounter.    Disposition:   FU with Will Camnitz 6 months  Signed, Will [name], MD  10/10/2017 8:58 AM     [facility] [address] [place] [address] [phone] (office) [phone] (fax)

## 2017-10-10 NOTE — Addendum Note (Signed)
Addended by: Stanton Kidney on: 10/10/2017 03:06 PM   Modules accepted: Orders

## 2017-10-10 NOTE — Patient Instructions (Addendum)
Medication Instructions:  Your physician has recommended you make the following change in your medication:  1. INCREASE Telmisartan to 40 mg daily  (new prescription sent to pharmacy is for 40 mg tablets)  Labwork: None ordered  Testing/Procedures: None ordered  Follow-Up: Your physician wants you to follow-up in: 6 months with Dr. Curt Bears.  You will receive a reminder letter in the mail two months in advance. If you don't receive a letter, please call our office to schedule the follow-up appointment.  * If you need a refill on your cardiac medications before your next appointment, please call your pharmacy.   *Please note that any paperwork needing to be filled out by the provider will need to be addressed at the front desk prior to seeing the provider. Please note that any FMLA, disability or other documents regarding health condition is subject to a $25.00 charge that must be received prior to completion of paperwork in the form of a money order or check.  Thank you for choosing [facility]!!   Trinidad Curet, RN [phone]   Please call the office in several weeks to report blood pressure numbers.

## 2017-10-25 ENCOUNTER — Telehealth: Payer: Self-pay | Admitting: Cardiology

## 2017-10-25 NOTE — Telephone Encounter (Signed)
New Message:       Pt's wife is calling to give an update on the pt's BP per RN req

## 2017-10-26 NOTE — Telephone Encounter (Signed)
Wife calls in reporting BP continuing to fluctuate.  Still elevated. Examples given: 150/70, 139/62, 167/79, 155/80, 137/79, 139/70, 166/83  Wife reports that pt has been experiencing decreased urinary frequency since hospitalization and medications were changed.  She also reports pt experiencing swelling in hands, ankles and feet.  This will "go down some" but not completely. She wonders if a medication could be responsible for swelling and decreased urinary output.  Blood pressure was well controlled when patient was taking Amlodipine (5mg ), Lisinopril (40mg ) and HCTZ (25mg ). Amlodipine stopped in the hospital secondary to starting pt on Diltiazem. Lisinopril was stopped by Dr. Melvyn Novas on 07/02/17.   Will forward to Dr. Curt Bears for advisement.  His office note states to increase Telmisartan to 80 if BP not controlled on 40 mg.

## 2017-10-26 NOTE — Telephone Encounter (Signed)
OK with that change in meds. If continued swelling, 20 mg lasix daily for 3 days.

## 2017-10-26 NOTE — Telephone Encounter (Signed)
Advised to follow up w/ PCP about decreased urinary output.  Wife tells me that he has f/u w/ PCP on the 18th and will discuss it then.  Informed that I would have Dr. Curt Bears review chart on Monday.  (was going to stop Diltiazem and restart Amlodipine, but Diltiazem was started in the hospital b/c HR was "out of control" [per wife]. Dr. Curt Bears is tied up in procedure for next hour or so.  She is willing to wait until Monday for advisement but will office over the weekend if needed)

## 2017-10-29 NOTE — Telephone Encounter (Signed)
Attempted to reach pt/wife.  No answer/no voicemail

## 2017-10-31 MED ORDER — AMLODIPINE BESYLATE 5 MG PO TABS: 5.0000 mg | ORAL_TABLET | Freq: Every day | ORAL | 3 refills | Status: DC

## 2017-10-31 NOTE — Telephone Encounter (Signed)
Reports BPs still fluctuating.  SBP still avg > 140/150s.  Last night 153/74, HR 68  Advised to stop Diltiazem and restart Norvasc. Wife will start this change tomorrow. Advised to call office back if HR begin to elevate after the stopping of Diltiazem.  Advised to call office if HR begin to average > 100 and/or symptoms begin w/ elevated rates. Wife verbalized understanding and agreeable to plan.

## 2017-11-01 DIAGNOSIS — E782 Mixed hyperlipidemia: Secondary | ICD-10-CM | POA: Diagnosis not present

## 2017-11-01 DIAGNOSIS — I10 Essential (primary) hypertension: Secondary | ICD-10-CM | POA: Diagnosis not present

## 2017-11-01 DIAGNOSIS — Z125 Encounter for screening for malignant neoplasm of prostate: Secondary | ICD-10-CM | POA: Diagnosis not present

## 2017-11-06 DIAGNOSIS — Z0000 Encounter for general adult medical examination without abnormal findings: Secondary | ICD-10-CM | POA: Diagnosis not present

## 2017-11-06 DIAGNOSIS — I10 Essential (primary) hypertension: Secondary | ICD-10-CM | POA: Diagnosis not present

## 2017-11-06 DIAGNOSIS — Z23 Encounter for immunization: Secondary | ICD-10-CM | POA: Diagnosis not present

## 2017-11-06 DIAGNOSIS — R600 Localized edema: Secondary | ICD-10-CM | POA: Diagnosis not present

## 2017-11-13 DIAGNOSIS — R600 Localized edema: Secondary | ICD-10-CM | POA: Diagnosis not present

## 2017-11-20 DIAGNOSIS — R600 Localized edema: Secondary | ICD-10-CM | POA: Diagnosis not present

## 2017-11-20 DIAGNOSIS — I10 Essential (primary) hypertension: Secondary | ICD-10-CM | POA: Diagnosis not present

## 2017-12-03 DIAGNOSIS — S30861A Insect bite (nonvenomous) of abdominal wall, initial encounter: Secondary | ICD-10-CM | POA: Diagnosis not present

## 2017-12-04 DIAGNOSIS — R600 Localized edema: Secondary | ICD-10-CM | POA: Diagnosis not present

## 2017-12-06 DIAGNOSIS — K30 Functional dyspepsia: Secondary | ICD-10-CM | POA: Diagnosis not present

## 2017-12-06 DIAGNOSIS — R0789 Other chest pain: Secondary | ICD-10-CM | POA: Diagnosis not present

## 2017-12-06 DIAGNOSIS — S30861A Insect bite (nonvenomous) of abdominal wall, initial encounter: Secondary | ICD-10-CM | POA: Diagnosis not present

## 2017-12-06 DIAGNOSIS — W57XXXA Bitten or stung by nonvenomous insect and other nonvenomous arthropods, initial encounter: Secondary | ICD-10-CM | POA: Diagnosis not present

## 2017-12-06 DIAGNOSIS — K429 Umbilical hernia without obstruction or gangrene: Secondary | ICD-10-CM | POA: Diagnosis not present

## 2017-12-18 DIAGNOSIS — I10 Essential (primary) hypertension: Secondary | ICD-10-CM | POA: Diagnosis not present

## 2018-01-14 DIAGNOSIS — L570 Actinic keratosis: Secondary | ICD-10-CM | POA: Diagnosis not present

## 2018-01-14 DIAGNOSIS — X32XXXA Exposure to sunlight, initial encounter: Secondary | ICD-10-CM | POA: Diagnosis not present

## 2018-01-14 DIAGNOSIS — D225 Melanocytic nevi of trunk: Secondary | ICD-10-CM | POA: Diagnosis not present

## 2018-02-24 ENCOUNTER — Other Ambulatory Visit: Payer: Self-pay | Admitting: Cardiology

## 2018-02-26 ENCOUNTER — Other Ambulatory Visit: Payer: Self-pay | Admitting: Cardiology

## 2018-04-09 ENCOUNTER — Ambulatory Visit: Payer: Medicare Other | Admitting: Cardiology

## 2018-04-09 ENCOUNTER — Encounter: Payer: Self-pay | Admitting: Cardiology

## 2018-04-09 VITALS — BP 126/70 | HR 77 | Ht 65.0 in | Wt 246.0 lb

## 2018-04-09 DIAGNOSIS — Z79899 Other long term (current) drug therapy: Secondary | ICD-10-CM | POA: Diagnosis not present

## 2018-04-09 DIAGNOSIS — R600 Localized edema: Secondary | ICD-10-CM

## 2018-04-09 DIAGNOSIS — R0602 Shortness of breath: Secondary | ICD-10-CM

## 2018-04-09 DIAGNOSIS — I480 Paroxysmal atrial fibrillation: Secondary | ICD-10-CM | POA: Diagnosis not present

## 2018-04-09 DIAGNOSIS — I4729 Other ventricular tachycardia: Secondary | ICD-10-CM

## 2018-04-09 DIAGNOSIS — I10 Essential (primary) hypertension: Secondary | ICD-10-CM

## 2018-04-09 DIAGNOSIS — I472 Ventricular tachycardia: Secondary | ICD-10-CM | POA: Diagnosis not present

## 2018-04-09 LAB — BASIC METABOLIC PANEL
BUN / CREAT RATIO: 15 (ref 10–24)
BUN: 14 mg/dL (ref 8–27)
CO2: 23 mmol/L (ref 20–29)
CREATININE: 0.91 mg/dL (ref 0.76–1.27)
Calcium: 9.7 mg/dL (ref 8.6–10.2)
Chloride: 98 mmol/L (ref 96–106)
GFR calc Af Amer: 96 mL/min/{1.73_m2} (ref >59)
GFR, EST NON AFRICAN AMERICAN: 83 mL/min/{1.73_m2} (ref >59)
GLUCOSE: 99 mg/dL (ref 65–99)
Potassium: 4.8 mmol/L (ref 3.5–5.2)
Sodium: 138 mmol/L (ref 134–144)

## 2018-04-09 LAB — PRO B NATRIURETIC PEPTIDE: NT-Pro BNP: 91 pg/mL (ref 0–376)

## 2018-04-09 MED ORDER — IRBESARTAN 150 MG PO TABS: 150.0000 mg | ORAL_TABLET | Freq: Every day | ORAL | 3 refills | Status: DC

## 2018-04-09 MED ORDER — FUROSEMIDE 40 MG PO TABS: 40.0000 mg | ORAL_TABLET | Freq: Every day | ORAL | 3 refills | Status: DC

## 2018-04-09 NOTE — Addendum Note (Signed)
Addended by: Marciano Sequin on: 04/09/2018 12:32 PM   Modules accepted: Orders

## 2018-04-09 NOTE — Patient Instructions (Addendum)
Medication Instructions:  Your physician has recommended you make the following change in your medication:  1. STOP Telmisartan/HCTZ (Micardis) 2. STOP Norvasc (Amlodipine) 3. STOP Hydrochlorothiazide 2. START Avapro (Irbesartan) 150 mg once daily 4. START Furosemide (Lasix) 40 mg once daily  If you need a refill on your cardiac medications before your next appointment, please call your pharmacy.   Lab work: Today: BMP & BNP If you have labs (blood work) drawn today and your tests are completely normal, you will receive your results only by: Marland Kitchen MyChart Message (if you have MyChart) OR . A paper copy in the mail If you have any lab test that is abnormal or we need to change your treatment, we will call you to review the results.  Testing/Procedures: None ordered  Follow-Up: At [facility], you and your health needs are our priority.  As part of our continuing mission to provide you with exceptional heart care, we have created designated Provider Care Teams.  These Care Teams include your primary Cardiologist (physician) and Advanced Practice Providers (APPs -  Physician Assistants and Nurse Practitioners) who all work together to provide you with the care you need, when you need it. You will need a follow up appointment in 2 weeks.  You will see one of the following Advanced Practice Providers on your designated Care Team: Almyra Deforest, [place] . Fabian Sharp, PA-C3  Your physician recommends that you schedule a follow-up appointment in: 3 months with Dr. Curt Bears.  Thank you for choosing [facility]!!   Trinidad Curet, RN [phone]  Any Other Special Instructions Will Be Listed Below (If Applicable).  Irbesartan tablets What is this medicine? IRBESARTAN (ir be SAR tan) is used to treat high blood pressure. This drug also slows down the progression of kidney disease in diabetic patients. This medicine may be used for other purposes; ask your health care provider or pharmacist if  you have questions. COMMON BRAND NAME(S): Avapro What should I tell my health care provider before I take this medicine? They need to know if you have any of these conditions: -heart failure -kidney or liver disease -an unusual or allergic reaction to irbesartan, other medicines, foods, dyes, or preservatives -pregnant or trying to get pregnant -breast-feeding How should I use this medicine? Take this medicine by mouth with a glass of water. Follow the directions on the prescription label. This medicine can be taken with or without food. Take your doses at regular intervals. Do not take your medicine more often than directed. Talk to your pediatrician regarding the use of this medicine in children. Special care may be needed. Overdosage: If you think you have taken too much of this medicine contact a poison control center or emergency room at once. NOTE: This medicine is only for you. Do not share this medicine with others. What if I miss a dose? If you miss a dose, take it as soon as you can. If it is almost time for your next dose, take only that dose. Do not take double or extra doses. What may interact with this medicine? -diuretics, especially triamterene, spironolactone, or amiloride -potassium salts or potassium supplements This list may not describe all possible interactions. Give your health care provider a list of all the medicines, herbs, non-prescription drugs, or dietary supplements you use. Also tell them if you smoke, drink alcohol, or use illegal drugs. Some items may interact with your medicine. What should I watch for while using this medicine? Visit your doctor or health care  professional for regular checks on your progress. Check your blood pressure as directed. Ask your doctor or health care professional what your blood pressure should be and when you should contact him or her. Call your doctor or health care professional if you notice an irregular or fast heart beat. Women  should inform their doctor if they wish to become pregnant or think they might be pregnant. There is a potential for serious side effects to an unborn child, particularly in the second or third trimester. Talk to your health care professional or pharmacist for more information. You may get drowsy or dizzy. Do not drive, use machinery, or do anything that needs mental alertness until you know how this drug affects you. Do not stand or sit up quickly, especially if you are an older patient. This reduces the risk of dizzy or fainting spells. Alcohol can make you more drowsy and dizzy. Avoid alcoholic drinks. Avoid salt substitutes unless you are told otherwise by your doctor or health care professional. Do not treat yourself for coughs, colds, or pain while you are taking this medicine without asking your doctor or health care professional for advice. Some ingredients may increase your blood pressure. What side effects may I notice from receiving this medicine? Side effects that you should report to your doctor or health care professional as soon as possible: -confusion, dizziness, light headedness or fainting spells -decreased amount of urine passed -difficulty breathing or swallowing, hoarseness, or tightening of the throat -fast or irregular heart beat or palpitations -swelling of your face, lips, tongue, hands, or feet -unusual rash or hives Side effects that usually do not require medical attention (report to your doctor or health care professional if they continue or are bothersome): -decreased sexual function -diarrhea -fatigue or tiredness -heartburn This list may not describe all possible side effects. Call your doctor for medical advice about side effects. You may report side effects to FDA at 1-800-FDA-1088. Where should I keep my medicine? Keep out of the reach of children. Store at room temperature between 15 and 30 degrees C (59 and 86 degrees F). Throw away any unused medicine after  the expiration date. NOTE: This sheet is a summary. It may not cover all possible information. If you have questions about this medicine, talk to your doctor, pharmacist, or health care provider.  2018 Elsevier/Gold Standard (2007-07-19 15:58:31)

## 2018-04-09 NOTE — Progress Notes (Signed)
Electrophysiology Office Note   Date:  04/09/2018   ID:  Derrick Alexander, DOB 1945-12-21, MRN [id number]  PCP:  [name], MD  Cardiologist:  Debara Pickett Primary Electrophysiologist:  Lunette Tapp [name], MD    No chief complaint on file.    History of Present Illness: Derrick Alexander is a 72 y.o. male who presents today for electrophysiology evaluation.  He has a history of hypertension, left bundle branch block, tobacco abuse with a 60-pack-year smoking history, and nonsustained VT.  He also has paroxysmal atrial fibrillation.  He had an episode of syncope and wore a cardiac monitor that showed no obvious causes for syncope but a 16 beat run of nonsustained VT.  He also had bradycardia into the 30s.  Today, denies symptoms of palpitations, chest pain, orthopnea, PND, claudication, dizziness, presyncope, syncope, bleeding, or neurologic sequela. The patient is tolerating medications without difficulties.  Over the past few months, he has been gaining weight and has been having swelling in his lower extremities as well as face.  He is 15 pounds heavier than he was in May.  He is almost 25 pounds heavier than he was in March.  He said that he gets short of breath much more easily with exertion, and has been unable to do much in the way of walking around.  He does have significant lower extremity edema.   Past Medical History:  Diagnosis Date  . A-fib ([place])   . Abnormal EKG 06/18/2015  . COPD (chronic obstructive pulmonary disease) ([place])   . COPD with exacerbation ([place])   . Faintness 06/18/2015  . Hypertension   . LBBB (left bundle branch block) 06/18/2015  . Obesity   . Syncope 08/06/2015  . Tobacco use   . V-tach ([place]) 08/06/2015   History reviewed. No pertinent surgical history.   Current Outpatient Medications  Medication Sig Dispense Refill  . famotidine (PEPCID) 20 MG tablet Take 20 mg by mouth at bedtime.     . nicotine (NICODERM CQ - DOSED IN MG/24 HOURS) 21 mg/24hr patch Place 21  mg onto the skin daily.    Marland Kitchen omeprazole (PRILOSEC) 20 MG capsule Take 20 mg by mouth every morning.    . pantoprazole (PROTONIX) 40 MG tablet Take 40 mg by mouth daily.  6  . spironolactone (ALDACTONE) 25 MG tablet Take 25 mg by mouth daily.  6  . XARELTO 20 MG TABS tablet TAKE 1 TABLET BY MOUTH ONCE DAILY WITH SUPPER 30 tablet 5  . furosemide (LASIX) 40 MG tablet Take 1 tablet (40 mg total) by mouth daily. 30 tablet 3  . irbesartan (AVAPRO) 150 MG tablet Take 1 tablet (150 mg total) by mouth daily. 30 tablet 3   No current facility-administered medications for this visit.     Allergies:   Patient has no known allergies.   Social History:  The patient  reports that he quit smoking about 9 months ago. His smoking use included cigarettes. He has a 120.00 pack-year smoking history. He has never used smokeless tobacco. He reports that he does not drink alcohol or use drugs.   Family History:  The patient's family history includes COPD in his brother; Diabetes in his mother; Heart disease in his brother.   ROS:  Please see the history of present illness.   Otherwise, review of systems is positive for weight gain, fatigue, leg swelling, facial swelling, snoring, wheezing, shortness of breath, back pain, swelling.   All other systems are reviewed and negative.   PHYSICAL  EXAM: VS:  BP 126/70   Pulse 77   Ht 5\' 5"  (1.651 m)   Wt 246 lb (111.6 kg)   SpO2 97%   BMI 40.94 kg/m  , BMI Body mass index is 40.94 kg/m. GEN: Well nourished, well developed, in no acute distress  HEENT: normal  Neck: no JVD, carotid bruits, or masses Cardiac: RRR; no murmurs, rubs, or gallops, 2+edema  Respiratory:  clear to auscultation bilaterally, normal work of breathing GI: soft, nontender, nondistended, + BS MS: no deformity or atrophy  Skin: warm and dry Neuro:  Strength and sensation are intact Psych: euthymic mood, full affect  EKG:  EKG is not ordered today. Personal review of the ekg ordered 12/06/17  shows SR, LAFB   Recent Labs: 06/13/2017: B Natriuretic Peptide 76.4; TSH 0.207 06/17/2017: BUN 32; Creatinine, Ser 0.89; Potassium 4.0; Sodium 137 08/01/2017: Hemoglobin 14.4; Platelets 161.0    Lipid Panel  No results found for: CHOL, TRIG, HDL, CHOLHDL, VLDL, LDLCALC, LDLDIRECT   Wt Readings from Last 3 Encounters:  04/09/18 246 lb (111.6 kg)  10/10/17 231 lb 6.4 oz (105 kg)  08/01/17 221 lb (100.2 kg)      Other studies Reviewed: Additional studies/ records that were reviewed today include: Spect 06/25/15  Review of the above records today demonstrates:   The left ventricular ejection fraction is normal (55-65%).  Nuclear stress EF: 57%.  There was no ST segment deviation noted during stress.  The study is normal.  Normal stress nuclear study with no ischemia or infarction; EF 57 with normal wall motion.  Tele monitor 06/17/14 Monitor shows NSVT up 16 beat runs and periods of ventricular escape rhythm in the 30's.   TTE 08/17/15 - Left ventricle: The cavity size was normal. Wall thickness was   increased in a pattern of mild LVH. Systolic function was   vigorous. The estimated ejection fraction was in the range of 65%   to 70%. Indeterminant diastolic function. Although no diagnostic   regional wall motion abnormality was identified, this possibility   cannot be completely excluded on the basis of this study. - Aortic valve: There was trivial regurgitation. - Aorta: Mildly dilated ascending aorta. Ascending aortic diameter:   38 mm (S). - Mitral valve: Mildly calcified annulus. There was no significant   regurgitation. - Right ventricle: The cavity size was normal. Systolic function   was normal. - Pulmonary arteries: No complete TR doppler jet so unable to   estimate PA systolic pressure. - Systemic veins: IVC measured 1.8 cm with < 50% respirophasic   variation, suggesting RA pressure 8 mmHg.  ASSESSMENT AND PLAN:  1.  NSVT: No further episodes.  No  palpitations.  2. Bradycardia: No further episodes noted.  No changes.    3. Syncope: No further episodes  4. Paroxysmal atrial fibrillation: Currently on Xarelto and diltiazem.  He has had no further episodes of atrial fibrillation.  No changes.  This patients CHA2DS2-VASc Score and unadjusted Ischemic Stroke Rate (% per year) is equal to 4.8 % stroke rate/year from a score of 4  Above score calculated as 1 point each if present [CHF, HTN, DM, Vascular=MI/PAD/Aortic Plaque, Age if 65-74, or Male] Above score calculated as 2 points each if present [Age > 75, or Stroke/TIA/TE]   5. Hypertension: Currently well controlled.  He is having lower extremity swelling.  We Vandella Ord thus stop his Norvasc.  Due to his swelling we Ludell Zacarias also stop his telmisartan HCTZ combo pill and put him on  Avapro.  6.  Lower extremity edema: He is likely quite volume overloaded and I feel that this is contributing to his symptoms of weakness, fatigue, and shortness of breath.  I Aariyana Manz get a BNP and basic metabolic today to check his kidney function and overall volume status.  If his BNP is significantly elevated, we Alvina Strother plan to repeat his echocardiogram.  We Kyndle Schlender start him on 40 mg daily of Lasix.  We Nyko Gell have him follow-up with a general app in 2 weeks.  Blood pressure is elevated today.  He was recently switched to telmisartan.  Lorella Gomez increase the dose to 40 mg and touch base with him in a few weeks to see if his blood pressure remains high.  If so, he would benefit from an increase to 80 mg.   Current medicines are reviewed at length with the patient today.   The patient does not have concerns regarding his medicines.  The following changes were made today: Stop Norvasc, stop telmisartan, stop HCTZ, start Avapro, Lasix  Labs/ tests ordered today include:  Orders Placed This Encounter  Procedures  . Pro b natriuretic peptide (BNP)  . Basic metabolic panel     Disposition:   FU with Mushka Laconte 3  months  Signed, Alejandrina Raimer [name], MD  04/09/2018 12:16 PM     [place] [address] [phone] (office) [phone] (fax)

## 2018-04-23 ENCOUNTER — Encounter: Payer: Self-pay | Admitting: Physician Assistant

## 2018-04-23 ENCOUNTER — Ambulatory Visit (INDEPENDENT_AMBULATORY_CARE_PROVIDER_SITE_OTHER): Payer: Medicare Other | Admitting: Physician Assistant

## 2018-04-23 VITALS — BP 148/78 | Ht 65.0 in | Wt 244.6 lb

## 2018-04-23 DIAGNOSIS — I472 Ventricular tachycardia: Secondary | ICD-10-CM

## 2018-04-23 DIAGNOSIS — R0609 Other forms of dyspnea: Secondary | ICD-10-CM

## 2018-04-23 DIAGNOSIS — I447 Left bundle-branch block, unspecified: Secondary | ICD-10-CM

## 2018-04-23 DIAGNOSIS — E8770 Fluid overload, unspecified: Secondary | ICD-10-CM | POA: Diagnosis not present

## 2018-04-23 DIAGNOSIS — I480 Paroxysmal atrial fibrillation: Secondary | ICD-10-CM

## 2018-04-23 DIAGNOSIS — I10 Essential (primary) hypertension: Secondary | ICD-10-CM

## 2018-04-23 DIAGNOSIS — R7303 Prediabetes: Secondary | ICD-10-CM

## 2018-04-23 DIAGNOSIS — I4729 Other ventricular tachycardia: Secondary | ICD-10-CM

## 2018-04-23 MED ORDER — FUROSEMIDE 40 MG PO TABS: 40.0000 mg | ORAL_TABLET | Freq: Two times a day (BID) | ORAL | 1 refills | Status: DC

## 2018-04-23 MED ORDER — IRBESARTAN 300 MG PO TABS: 300.0000 mg | ORAL_TABLET | Freq: Every day | ORAL | 3 refills | Status: DC

## 2018-04-23 NOTE — Progress Notes (Signed)
Cardiology Office Note    Date:  04/23/2018   ID:  Derrick Alexander, DOB [birth year], MRN [id number]  PCP:  [name], MD  Cardiologist: Dr. Debara Pickett Electrophysiology: Dr. Curt Bears  Chief Complaint  Patient presents with  . Follow-up    seen for Dr. Debara Pickett    History of Present Illness:  Derrick Alexander is a 72 y.o. male with past medical history of hypertension, LBBB, tobacco abuse, history of nonsustained VT, and paroxysmal atrial fibrillation.  He had a episode of syncope, after which he were a cardiac monitor which did not show obvious cause for the syncope but did reveal 16 beats run of nonsustained VT.  He had bradycardia into the 30s.  He is on Xarelto and diltiazem for paroxysmal atrial fibrillation.  He has not had any significant recurrence.  Echocardiogram obtained on 08/09/2015 showed EF 65 to 70%, mildly dilated a sending aorta measuring 38 mm, no significant valve issue.  Myoview obtained on 06/25/2015 showed EF 57%, no ischemia.  During the last office visit with Dr. Curt Bears on 04/09/2018, it was noted patient had symptom of volume overload, he was started on 40 mg daily of Lasix.  He presents today to follow-up on his volume status.  His Micardis was stopped along with amlodipine, he was started on Lasix and Avapro.  Patient presents today for cardiology office visit.  His blood pressure has been running high recently after started the new irbesartan, I will increase to irbesartan to 300 mg daily.  He denies any recent chest pain, he denies any orthopnea or PND episode.  He continued to have dyspnea on exertion.  Given his recent sign of volume overload, I recommend a echocardiogram to make sure his ejection fraction is okay.  Otherwise, he continued to have at least 2-3+ pitting edema in bilateral lower extremity, slightly worse on the left side.  I recommend increase the Lasix to 40 mg twice daily.  He will need a basic metabolic panel in 1 week.  I will see him back in 2 to 3 weeks for  reassessment.   Past Medical History:  Diagnosis Date  . A-fib ([place])   . Abnormal EKG 06/18/2015  . COPD (chronic obstructive pulmonary disease) ([place])   . COPD with exacerbation ([place])   . Faintness 06/18/2015  . Hypertension   . LBBB (left bundle branch block) 06/18/2015  . Obesity   . Syncope 08/06/2015  . Tobacco use   . V-tach ([place]) 08/06/2015    History reviewed. No pertinent surgical history.  Current Medications: Outpatient Medications Prior to Visit  Medication Sig Dispense Refill  . famotidine (PEPCID) 20 MG tablet Take 20 mg by mouth at bedtime.     . nicotine (NICODERM CQ - DOSED IN MG/24 HOURS) 14 mg/24hr patch Place 14 mg onto the skin daily.    . nicotine (NICODERM CQ - DOSED IN MG/24 HOURS) 21 mg/24hr patch Place 21 mg onto the skin daily.    . pantoprazole (PROTONIX) 40 MG tablet Take 40 mg by mouth daily.  6  . spironolactone (ALDACTONE) 25 MG tablet Take 25 mg by mouth daily.  6  . XARELTO 20 MG TABS tablet TAKE 1 TABLET BY MOUTH ONCE DAILY WITH SUPPER 30 tablet 5  . furosemide (LASIX) 40 MG tablet Take 1 tablet (40 mg total) by mouth daily. 30 tablet 3  . irbesartan (AVAPRO) 150 MG tablet Take 1 tablet (150 mg total) by mouth daily. (Patient not taking: Reported on 04/23/2018) 30 tablet 3  .  omeprazole (PRILOSEC) 20 MG capsule Take 20 mg by mouth every morning.     No facility-administered medications prior to visit.      Allergies:   Patient has no known allergies.   Social History   Socioeconomic History  . Marital status: Married    Spouse name: Not on file  . Number of children: Not on file  . Years of education: Not on file  . Highest education level: Not on file  Occupational History  . Not on file  Social Needs  . Financial resource strain: Not on file  . Food insecurity:    Worry: Not on file    Inability: Not on file  . Transportation needs:    Medical: Not on file    Non-medical: Not on file  Tobacco Use  . Smoking status: Former Smoker      Packs/day: 2.00    Years: 60.00    Pack years: 120.00    Types: Cigarettes    Last attempt to quit: 06/13/2017    Years since quitting: 0.8  . Smokeless tobacco: Never Used  Substance and Sexual Activity  . Alcohol use: No    Alcohol/week: 0.0 standard drinks  . Drug use: No  . Sexual activity: Not on file  Lifestyle  . Physical activity:    Days per week: Not on file    Minutes per session: Not on file  . Stress: Not on file  Relationships  . Social connections:    Talks on phone: Not on file    Gets together: Not on file    Attends religious service: Not on file    Active member of club or organization: Not on file    Attends meetings of clubs or organizations: Not on file    Relationship status: Not on file  Other Topics Concern  . Not on file  Social History Narrative  . Not on file     Family History:  The patient's family history includes COPD in his brother; Diabetes in his mother; Heart disease in his brother.   ROS:   Please see the history of present illness.    ROS All other systems reviewed and are negative.   PHYSICAL EXAM:   VS:  BP (!) 148/78   Ht 5\' 5"  (1.651 m)   Wt 244 lb 9.6 oz (110.9 kg)   BMI 40.70 kg/m    GEN: Well nourished, well developed, in no acute distress  HEENT: normal  Neck: no JVD, carotid bruits, or masses Cardiac: RRR; no murmurs, rubs, or gallops.  2-3+ pitting edema  Respiratory:  clear to auscultation bilaterally, normal work of breathing GI: soft, nontender, nondistended, + BS MS: no deformity or atrophy  Skin: warm and dry, no rash Neuro:  Alert and Oriented x 3, Strength and sensation are intact Psych: euthymic mood, full affect  Wt Readings from Last 3 Encounters:  04/23/18 244 lb 9.6 oz (110.9 kg)  04/09/18 246 lb (111.6 kg)  10/10/17 231 lb 6.4 oz (105 kg)      Studies/Labs Reviewed:   EKG:  EKG is ordered today.  The ekg ordered today demonstrates normal sinus rhythm with left bundle branch block, unchanged  when compared to the previous EKG  Recent Labs: 06/13/2017: B Natriuretic Peptide 76.4; TSH 0.207 08/01/2017: Hemoglobin 14.4; Platelets 161.0 04/09/2018: BUN 14; Creatinine, Ser 0.91; NT-Pro BNP 91; Potassium 4.8; Sodium 138   Lipid Panel No results found for: CHOL, TRIG, HDL, CHOLHDL, VLDL, LDLCALC, LDLDIRECT  Additional  studies/ records that were reviewed today include:   Echo 07/28/2015 LV EF: 65% -   70% Study Conclusions  - Left ventricle: The cavity size was normal. Wall thickness was   increased in a pattern of mild LVH. Systolic function was   vigorous. The estimated ejection fraction was in the range of 65%   to 70%. Indeterminant diastolic function. Although no diagnostic   regional wall motion abnormality was identified, this possibility   cannot be completely excluded on the basis of this study. - Aortic valve: There was trivial regurgitation. - Aorta: Mildly dilated ascending aorta. Ascending aortic diameter:   38 mm (S). - Mitral valve: Mildly calcified annulus. There was no significant   regurgitation. - Right ventricle: The cavity size was normal. Systolic function   was normal. - Pulmonary arteries: No complete TR doppler jet so unable to   estimate PA systolic pressure. - Systemic veins: IVC measured 1.8 cm with < 50% respirophasic   variation, suggesting RA pressure 8 mmHg.  Impressions:  - The patient was in atrial fibrillation. Normal LV size with mild   LV hypertrophy. EF 65-70%. Normal RV size and systolic function.   No significant valvular abnormalities.   Myoview 06/25/2015 Study Highlights    The left ventricular ejection fraction is normal (55-65%).  Nuclear stress EF: 57%.  There was no ST segment deviation noted during stress.  The study is normal.   Normal stress nuclear study with no ischemia or infarction; EF 57 with normal wall motion.       ASSESSMENT:    1. DOE (dyspnea on exertion)   2. Hypervolemia, unspecified  hypervolemia type   3. Essential hypertension   4. LBBB (left bundle branch block)   5. NSVT (nonsustained ventricular tachycardia) ([facility])   6. PAF (paroxysmal atrial fibrillation) ([facility])      PLAN:  In order of problems listed above:  1. Lower extremity edema: Increasing Lasix to 40 mg twice daily.  Repeat basic metabolic panel in 1 week.  We discussed salt restriction, fluid restriction to between 32 to 64 ounces and daily weight monitoring.  2. Dyspnea on exertion: Patient described worsening dyspnea on exertion for the past 2 months along with increasing lower extremity edema.  I recommend a echocardiogram to follow-up on the ejection fraction.  I did not appreciate significant heart murmur on physical exam.  Hopefully with diuresing, his dyspnea on exertion will improve as well.  3. Hypertension: Blood pressure continue to be elevated on the current dose of irbesartan, will increase irbesartan to 300 mg daily  4. Prediabetes: His last hemoglobin A1c was 6.0.  He need to watch his diet and increase activity level.  5. Hyperlipidemia: He is currently not on any statin medication.  His last lipid panel showed good HDL, however LDL greater than 130, total cholesterol 218, triglyceride was also over 200.  Again emphasized on diet and exercise, if his cholesterol will continue to be uncontrolled, I recommend start him on a low-dose statin.  6. History of nonsustained VT: No obvious recurrence.  7. PAF: On Xarelto.  Not on rate control given history of bradycardia.  Previously on diltiazem, however based on phone note, this was stopped at the change to amlodipine on 10/25/2017.  Maintaining sinus rhythm on today's EKG.    Medication Adjustments/Labs and Tests Ordered: Current medicines are reviewed at length with the patient today.  Concerns regarding medicines are outlined above.  Medication changes, Labs and Tests ordered today are listed in  the Patient Instructions below. Patient  Instructions  Medication Instructions:  Your physician has recommended you make the following change in your medication:  INCREASE YOUR IRBESARTAN TO 300 MG DAILY. INCREASE YOUR LASIX TO 40 MG TWICE DAILY.   If you need a refill on your cardiac medications before your next appointment, please call your pharmacy.   Lab work: Your physician recommends that you return for lab work in: [place]; PLEASE BRING YOUR LAB [place].  If you have labs (blood work) drawn today and your tests are completely normal, you will receive your results only by: Marland Kitchen MyChart Message (if you have MyChart) OR . A paper copy in the mail If you have any lab test that is abnormal or we need to change your treatment, we will call you to review the results.  Testing/Procedures: Your physician has requested that you have an echocardiogram. Echocardiography is a painless test that uses sound waves to create images of your heart. It provides your doctor with information about the size and shape of your heart and how well your heart's chambers and valves are working. This procedure takes approximately one hour. There are no restrictions for this procedure.    Follow-Up: At [facility], you and your health needs are our priority.  As part of our continuing mission to provide you with exceptional heart care, we have created designated Provider Care Teams.  These Care Teams include your primary Cardiologist (physician) and Advanced Practice Providers (APPs -  Physician Assistants and Nurse Practitioners) who all work together to provide you with the care you need, when you need it. You will need a follow up appointment in 2-3 weeks with Almyra Deforest, PA.   Your physician recommends that you schedule a follow-up appointment in: 3 MONTHS WITH DR. Francina Ames, [place]  04/23/2018 3:14 PM    [facility] [place], [place], [place]  [address] Phone: [phone]; Fax: [phone]

## 2018-04-23 NOTE — Patient Instructions (Signed)
Medication Instructions:  Your physician has recommended you make the following change in your medication:  INCREASE YOUR IRBESARTAN TO 300 MG DAILY. INCREASE YOUR LASIX TO 40 MG TWICE DAILY.   If you need a refill on your cardiac medications before your next appointment, please call your pharmacy.   Lab work: Your physician recommends that you return for lab work in: [place]; PLEASE BRING YOUR LAB [place].  If you have labs (blood work) drawn today and your tests are completely normal, you will receive your results only by: Marland Kitchen MyChart Message (if you have MyChart) OR . A paper copy in the mail If you have any lab test that is abnormal or we need to change your treatment, we will call you to review the results.  Testing/Procedures: Your physician has requested that you have an echocardiogram. Echocardiography is a painless test that uses sound waves to create images of your heart. It provides your doctor with information about the size and shape of your heart and how well your heart's chambers and valves are working. This procedure takes approximately one hour. There are no restrictions for this procedure.    Follow-Up: At [facility], you and your health needs are our priority.  As part of our continuing mission to provide you with exceptional heart care, we have created designated Provider Care Teams.  These Care Teams include your primary Cardiologist (physician) and Advanced Practice Providers (APPs -  Physician Assistants and Nurse Practitioners) who all work together to provide you with the care you need, when you need it. You will need a follow up appointment in 2-3 weeks with Almyra Deforest, PA.   Your physician recommends that you schedule a follow-up appointment in: 3 MONTHS WITH DR. HILTY

## 2018-04-29 ENCOUNTER — Ambulatory Visit (HOSPITAL_COMMUNITY): Payer: Medicare Other | Attending: Cardiology

## 2018-04-29 ENCOUNTER — Other Ambulatory Visit: Payer: Self-pay

## 2018-04-29 DIAGNOSIS — E8770 Fluid overload, unspecified: Secondary | ICD-10-CM | POA: Diagnosis not present

## 2018-04-29 DIAGNOSIS — R0609 Other forms of dyspnea: Secondary | ICD-10-CM

## 2018-04-29 MED ORDER — PERFLUTREN LIPID MICROSPHERE
1.0000 mL | INTRAVENOUS | Status: AC | PRN
  Administered 2018-04-29: 1 mL via INTRAVENOUS

## 2018-04-30 DIAGNOSIS — R0609 Other forms of dyspnea: Secondary | ICD-10-CM | POA: Diagnosis not present

## 2018-04-30 DIAGNOSIS — E8770 Fluid overload, unspecified: Secondary | ICD-10-CM | POA: Diagnosis not present

## 2018-04-30 LAB — BASIC METABOLIC PANEL
BUN/Creatinine Ratio: 17 (ref 10–24)
BUN: 16 mg/dL (ref 8–27)
CO2: 26 mmol/L (ref 20–29)
Calcium: 9.9 mg/dL (ref 8.6–10.2)
Chloride: 95 mmol/L — ABNORMAL LOW (ref 96–106)
Creatinine, Ser: 0.93 mg/dL (ref 0.76–1.27)
GFR calc Af Amer: 94 mL/min/{1.73_m2} (ref >59)
GFR calc non Af Amer: 81 mL/min/{1.73_m2} (ref >59)
GLUCOSE: 98 mg/dL (ref 65–99)
POTASSIUM: 4.5 mmol/L (ref 3.5–5.2)
Sodium: 137 mmol/L (ref 134–144)

## 2018-05-10 ENCOUNTER — Ambulatory Visit: Payer: Medicare Other | Admitting: Physician Assistant

## 2018-05-10 ENCOUNTER — Encounter: Payer: Self-pay | Admitting: Physician Assistant

## 2018-05-10 VITALS — BP 102/72 | HR 64 | Ht 65.0 in | Wt 244.6 lb

## 2018-05-10 DIAGNOSIS — Z79899 Other long term (current) drug therapy: Secondary | ICD-10-CM

## 2018-05-10 DIAGNOSIS — I10 Essential (primary) hypertension: Secondary | ICD-10-CM

## 2018-05-10 DIAGNOSIS — R600 Localized edema: Secondary | ICD-10-CM

## 2018-05-10 DIAGNOSIS — I480 Paroxysmal atrial fibrillation: Secondary | ICD-10-CM | POA: Diagnosis not present

## 2018-05-10 MED ORDER — FUROSEMIDE 40 MG PO TABS: 60.0000 mg | ORAL_TABLET | Freq: Two times a day (BID) | ORAL | 3 refills | Status: DC

## 2018-05-10 NOTE — Progress Notes (Signed)
Cardiology Office Note    Date:  05/12/2018   ID:  Derrick Alexander, DOB 04/17/1946, MRN [id number]  PCP:  [name], MD  Cardiologist:  Dr. Debara Pickett Electrophysiology: Dr. Curt Bears   Chief Complaint  Patient presents with  . Edema    legs and feet    History of Present Illness:  Derrick Alexander is a 72 y.o. male with past medical history of hypertension, LBBB, tobacco abuse, history of nonsustained VT, and paroxysmal atrial fibrillation.  He had a episode of syncope, after which he were a cardiac monitor which did not show obvious cause for the syncope but did reveal 16 beats run of nonsustained VT.  He had bradycardia into the 30s.  He is on Xarelto and diltiazem for paroxysmal atrial fibrillation.  He has not had any significant recurrence.  Diltiazem was later switched to amlodipine due to significant bradycardia. Echocardiogram obtained on 08/09/2015 showed EF 65 to 70%, mildly dilated asending aorta measuring 38 mm, no significant valve issue.  Myoview obtained on 06/25/2015 showed EF 57%, no ischemia.  During the last office visit with Dr. Curt Bears on 04/09/2018, it was noted patient had symptom of volume overload, he was started on 40 mg daily of Lasix.  His Micardis was stopped along with amlodipine, he was started on Lasix and Avapro.  I last saw the patient on 04/23/2018, his blood pressure has been high after starting on the new irbesartan.  I increased his irbesartan to 300 mg daily.  I also increased his Lasix to 40 mg twice daily as well.  Repeat echocardiogram obtained on 04/29/2018 showed EF 60 to 65%, grade 1 DD.  She returned for follow-up, his blood pressure is very well controlled on the current therapy.  Unfortunately he continues to have 2+ pitting edema in bilateral lower extremity, I will increase his Lasix to 60 mg twice daily and obtain a repeat basic metabolic panel in 1 week.  Otherwise he denies any exertional chest discomfort.   Past Medical History:  Diagnosis Date  .  A-fib ([place])   . Abnormal EKG 06/18/2015  . COPD (chronic obstructive pulmonary disease) ([place])   . COPD with exacerbation ([place])   . Faintness 06/18/2015  . Hypertension   . LBBB (left bundle branch block) 06/18/2015  . Obesity   . Syncope 08/06/2015  . Tobacco use   . V-tach ([place]) 08/06/2015    History reviewed. No pertinent surgical history.  Current Medications: Outpatient Medications Prior to Visit  Medication Sig Dispense Refill  . famotidine (PEPCID) 20 MG tablet Take 20 mg by mouth at bedtime.     . irbesartan (AVAPRO) 300 MG tablet Take 1 tablet (300 mg total) by mouth daily. 90 tablet 3  . nicotine (NICODERM CQ - DOSED IN MG/24 HOURS) 14 mg/24hr patch Place 14 mg onto the skin daily.    . pantoprazole (PROTONIX) 40 MG tablet Take 40 mg by mouth daily.  6  . spironolactone (ALDACTONE) 25 MG tablet Take 25 mg by mouth daily.  6  . XARELTO 20 MG TABS tablet TAKE 1 TABLET BY MOUTH ONCE DAILY WITH SUPPER 30 tablet 5  . furosemide (LASIX) 40 MG tablet Take 1 tablet (40 mg total) by mouth 2 (two) times daily. 180 tablet 1  . nicotine (NICODERM CQ - DOSED IN MG/24 HOURS) 21 mg/24hr patch Place 21 mg onto the skin daily.     No facility-administered medications prior to visit.      Allergies:   Patient has no  known allergies.   Social History   Socioeconomic History  . Marital status: Married    Spouse name: Not on file  . Number of children: Not on file  . Years of education: Not on file  . Highest education level: Not on file  Occupational History  . Not on file  Social Needs  . Financial resource strain: Not on file  . Food insecurity:    Worry: Not on file    Inability: Not on file  . Transportation needs:    Medical: Not on file    Non-medical: Not on file  Tobacco Use  . Smoking status: Former Smoker    Packs/day: 2.00    Years: 60.00    Pack years: 120.00    Types: Cigarettes    Last attempt to quit: 06/13/2017    Years since quitting: 0.9  . Smokeless  tobacco: Never Used  Substance and Sexual Activity  . Alcohol use: No    Alcohol/week: 0.0 standard drinks  . Drug use: No  . Sexual activity: Not on file  Lifestyle  . Physical activity:    Days per week: Not on file    Minutes per session: Not on file  . Stress: Not on file  Relationships  . Social connections:    Talks on phone: Not on file    Gets together: Not on file    Attends religious service: Not on file    Active member of club or organization: Not on file    Attends meetings of clubs or organizations: Not on file    Relationship status: Not on file  Other Topics Concern  . Not on file  Social History Narrative  . Not on file     Family History:  The patient's family history includes COPD in his brother; Diabetes in his mother; Heart disease in his brother.   ROS:   Please see the history of present illness.    ROS All other systems reviewed and are negative.   PHYSICAL EXAM:   VS:  BP 102/72   Pulse 64   Ht 5\' 5"  (1.651 m)   Wt 244 lb 9.6 oz (110.9 kg)   BMI 40.70 kg/m    GEN: Well nourished, well developed, in no acute distress  HEENT: normal  Neck: no JVD, carotid bruits, or masses Cardiac: RRR; no murmurs, rubs, or gallops. 2+ edema  Respiratory:  clear to auscultation bilaterally, normal work of breathing GI: soft, nontender, nondistended, + BS MS: no deformity or atrophy  Skin: warm and dry, no rash Neuro:  Alert and Oriented x 3, Strength and sensation are intact Psych: euthymic mood, full affect  Wt Readings from Last 3 Encounters:  05/10/18 244 lb 9.6 oz (110.9 kg)  04/23/18 244 lb 9.6 oz (110.9 kg)  04/09/18 246 lb (111.6 kg)      Studies/Labs Reviewed:   EKG:  EKG is not ordered today.    Recent Labs: 06/13/2017: B Natriuretic Peptide 76.4; TSH 0.207 08/01/2017: Hemoglobin 14.4; Platelets 161.0 04/09/2018: NT-Pro BNP 91 04/30/2018: BUN 16; Creatinine, Ser 0.93; Potassium 4.5; Sodium 137   Lipid Panel No results found for: CHOL,  TRIG, HDL, CHOLHDL, VLDL, LDLCALC, LDLDIRECT  Additional studies/ records that were reviewed today include:   Echo 04/29/2018 LV EF: 60% -   65% Study Conclusions  - Left ventricle: The cavity size was normal. Wall thickness was   normal. Systolic function was normal. The estimated ejection   fraction was in the range of  60% to 65%. Wall motion was normal;   there were no regional wall motion abnormalities. Doppler   parameters are consistent with abnormal left ventricular   relaxation (grade 1 diastolic dysfunction).    ASSESSMENT:    1. Leg edema   2. Medication management   3. Essential hypertension   4. PAF (paroxysmal atrial fibrillation) (HCC)      PLAN:  In order of problems listed above:  1. Leg edema: Continue to have leg edema despite up titration of Lasix, increase Lasix to 60 mg twice daily.  Obtain basic metabolic panel in 1 week  2. Hypertension: Blood pressure stable on current therapy  3. Paroxysmal atrial fibrillation: No obvious recurrence.  On Xarelto 20 mg daily.  Not on any AV nodal blocking agent.    Medication Adjustments/Labs and Tests Ordered: Current medicines are reviewed at length with the patient today.  Concerns regarding medicines are outlined above.  Medication changes, Labs and Tests ordered today are listed in the Patient Instructions below. Patient Instructions  Medication Instructions:  Increase: Lasix 60 mg ( 1 1/2 tablets) two times a day If you need a refill on your cardiac medications before your next appointment, please call your pharmacy.   Lab work: Your physician recommends that you return for lab work 05/16/18 (BMP)  If you have labs (blood work) drawn today and your tests are completely normal, you will receive your results only by: Marland Kitchen MyChart Message (if you have MyChart) OR . A paper copy in the mail If you have any lab test that is abnormal or we need to change your treatment, we will call you to review the  results.  Testing/Procedures: None  Follow-Up: At [facility], you and your health needs are our priority.  As part of our continuing mission to provide you with exceptional heart care, we have created designated Provider Care Teams.  These Care Teams include your primary Cardiologist (physician) and Advanced Practice Providers (APPs -  Physician Assistants and Nurse Practitioners) who all work together to provide you with the care you need, when you need it.  Your physician recommends that you keep scheduled appointment with Dr. Debara Pickett.         Hilbert Corrigan, [place]  05/12/2018 11:47 PM    [facility] [place], [place], [place]  [address] Phone: [phone]; Fax: [phone]

## 2018-05-10 NOTE — Patient Instructions (Signed)
Medication Instructions:  Increase: Lasix 60 mg ( 1 1/2 tablets) two times a day If you need a refill on your cardiac medications before your next appointment, please call your pharmacy.   Lab work: Your physician recommends that you return for lab work 05/16/18 (BMP)  If you have labs (blood work) drawn today and your tests are completely normal, you will receive your results only by: Marland Kitchen MyChart Message (if you have MyChart) OR . A paper copy in the mail If you have any lab test that is abnormal or we need to change your treatment, we will call you to review the results.  Testing/Procedures: None  Follow-Up: At [facility], you and your health needs are our priority.  As part of our continuing mission to provide you with exceptional heart care, we have created designated Provider Care Teams.  These Care Teams include your primary Cardiologist (physician) and Advanced Practice Providers (APPs -  Physician Assistants and Nurse Practitioners) who all work together to provide you with the care you need, when you need it.  Your physician recommends that you keep scheduled appointment with Dr. Debara Pickett.

## 2018-05-12 ENCOUNTER — Encounter: Payer: Self-pay | Admitting: Physician Assistant

## 2018-05-17 DIAGNOSIS — Z79899 Other long term (current) drug therapy: Secondary | ICD-10-CM | POA: Diagnosis not present

## 2018-05-17 LAB — BASIC METABOLIC PANEL
BUN/Creatinine Ratio: 16 (ref 10–24)
BUN: 16 mg/dL (ref 8–27)
CO2: 26 mmol/L (ref 20–29)
Calcium: 10.5 mg/dL — ABNORMAL HIGH (ref 8.6–10.2)
Chloride: 92 mmol/L — ABNORMAL LOW (ref 96–106)
Creatinine, Ser: 0.98 mg/dL (ref 0.76–1.27)
GFR calc Af Amer: 88 mL/min/{1.73_m2} (ref >59)
GFR calc non Af Amer: 76 mL/min/{1.73_m2} (ref >59)
GLUCOSE: 105 mg/dL — AB (ref 65–99)
Potassium: 4.5 mmol/L (ref 3.5–5.2)
Sodium: 135 mmol/L (ref 134–144)

## 2018-05-20 NOTE — Progress Notes (Signed)
Stable renal function and electrolyte

## 2018-07-16 ENCOUNTER — Ambulatory Visit: Payer: Medicare Other | Admitting: Cardiology

## 2018-07-16 ENCOUNTER — Encounter: Payer: Self-pay | Admitting: Cardiology

## 2018-07-16 VITALS — BP 124/66 | HR 75 | Ht 65.0 in | Wt 247.0 lb

## 2018-07-16 DIAGNOSIS — I480 Paroxysmal atrial fibrillation: Secondary | ICD-10-CM

## 2018-07-16 NOTE — Patient Instructions (Signed)
Medication Instructions:  Your physician recommends that you continue on your current medications as directed. Please refer to the Current Medication list given to you today.  If you need a refill on your cardiac medications before your next appointment, please call your pharmacy.   Labwork: None ordered  Testing/Procedures: None ordered  Follow-Up: Your physician wants you to follow-up in: 6 months with Dr. Camnitz.  You will receive a reminder letter in the mail two months in advance. If you don't receive a letter, please call our office to schedule the follow-up appointment.  Thank you for choosing [facility]!!   Mckinzie Saksa, RN [phone]         

## 2018-07-16 NOTE — Progress Notes (Signed)
Electrophysiology Office Note   Date:  07/16/2018   ID:  Derrick Alexander, Derrick Alexander [birth year], MRN [id number]  PCP:  [name], MD  Cardiologist:  Debara Pickett Primary Electrophysiologist:  Gussie Murton [name], MD    No chief complaint on file.    History of Present Illness: Derrick Alexander is a 73 y.o. male who presents today for electrophysiology evaluation.  He has a history of hypertension, left bundle branch block, tobacco abuse with a 60-pack-year smoking history, and nonsustained VT.  He also has paroxysmal atrial fibrillation.  He had an episode of syncope and wore a cardiac monitor that showed no obvious causes for syncope but a 16 beat run of nonsustained VT.  He also had bradycardia into the 30s.  Today, denies symptoms of palpitations, chest pain, shortness of breath, orthopnea, PND, lower extremity edema, claudication, dizziness, presyncope, syncope, bleeding, or neurologic sequela. The patient is tolerating medications without difficulties.  His main complaint today is of weakness and fatigue.  He says that he walks short distances and becomes weak and fatigued.  He has to hold himself up due to weakness in his legs.  He also gets short of breath.  He had an echocardiogram recently which showed a normal ejection fraction and grade 1 diastolic dysfunction as well as a normal BNP in November.   Past Medical History:  Diagnosis Date  . A-fib ([place])   . Abnormal EKG 06/18/2015  . COPD (chronic obstructive pulmonary disease) ([place])   . COPD with exacerbation ([place])   . Faintness 06/18/2015  . Hypertension   . LBBB (left bundle branch block) 06/18/2015  . Obesity   . Syncope 08/06/2015  . Tobacco use   . V-tach ([place]) 08/06/2015   No past surgical history on file.   Current Outpatient Medications  Medication Sig Dispense Refill  . famotidine (PEPCID) 20 MG tablet Take 20 mg by mouth at bedtime.     . furosemide (LASIX) 40 MG tablet Take 1.5 tablets (60 mg total) by mouth 2 (two) times  daily. 270 tablet 3  . irbesartan (AVAPRO) 300 MG tablet Take 1 tablet (300 mg total) by mouth daily. 90 tablet 3  . nicotine (NICODERM CQ - DOSED IN MG/24 HOURS) 14 mg/24hr patch Place 14 mg onto the skin daily.    . pantoprazole (PROTONIX) 40 MG tablet Take 40 mg by mouth daily.  6  . spironolactone (ALDACTONE) 25 MG tablet Take 25 mg by mouth daily.  6  . XARELTO 20 MG TABS tablet TAKE 1 TABLET BY MOUTH ONCE DAILY WITH SUPPER 30 tablet 5   No current facility-administered medications for this visit.     Allergies:   Patient has no known allergies.   Social History:  The patient  reports that he quit smoking about 13 months ago. His smoking use included cigarettes. He has a 120.00 pack-year smoking history. He has never used smokeless tobacco. He reports that he does not drink alcohol or use drugs.   Family History:  The patient's family history includes COPD in his brother; Diabetes in his mother; Heart disease in his brother.   ROS:  Please see the history of present illness.   Otherwise, review of systems is positive for weight gain, fatigue, leg swelling, hearing loss, shortness of breath, snoring, wheezing, difficulty urinating, back pain, easy bruising, nosebleeds.   All other systems are reviewed and negative.   PHYSICAL EXAM: VS:  There were no vitals taken for this visit. , BMI There is no height  or weight on file to calculate BMI. GEN: Well nourished, well developed, in no acute distress  HEENT: normal  Neck: no JVD, carotid bruits, or masses Cardiac: RRR; no murmurs, rubs, or gallops,no edema  Respiratory:  clear to auscultation bilaterally, normal work of breathing GI: soft, nontender, nondistended, + BS MS: no deformity or atrophy  Skin: warm and dry Neuro:  Strength and sensation are intact Psych: euthymic mood, full affect  EKG:  EKG is ordered today. Personal review of the ekg ordered shows sinus rhythm, incomplete right bundle branch block, rate 75, low voltage,  prolonged QTc 493 ms  Recent Labs: 08/01/2017: Hemoglobin 14.4; Platelets 161.0 04/09/2018: NT-Pro BNP 91 05/17/2018: BUN 16; Creatinine, Ser 0.98; Potassium 4.5; Sodium 135    Lipid Panel  No results found for: CHOL, TRIG, HDL, CHOLHDL, VLDL, LDLCALC, LDLDIRECT   Wt Readings from Last 3 Encounters:  05/10/18 244 lb 9.6 oz (110.9 kg)  04/23/18 244 lb 9.6 oz (110.9 kg)  04/09/18 246 lb (111.6 kg)      Other studies Reviewed: Additional studies/ records that were reviewed today include: Spect 06/25/15  Review of the above records today demonstrates:   The left ventricular ejection fraction is normal (55-65%).  Nuclear stress EF: 57%.  There was no ST segment deviation noted during stress.  The study is normal.  Normal stress nuclear study with no ischemia or infarction; EF 57 with normal wall motion.  Tele monitor 06/17/14 Monitor shows NSVT up 16 beat runs and periods of ventricular escape rhythm in the 30's.   TTE 04/29/18 - Left ventricle: The cavity size was normal. Wall thickness was   normal. Systolic function was normal. The estimated ejection   fraction was in the range of 60% to 65%. Wall motion was normal;   there were no regional wall motion abnormalities. Doppler   parameters are consistent with abnormal left ventricular   relaxation (grade 1 diastolic dysfunction).   ASSESSMENT AND PLAN:  1.  NSVT: No obvious recurrence  2. Bradycardia: No further bradycardia noted  3. Syncope: No further episodes of syncope  4. Paroxysmal atrial fibrillation: Currently on Xarelto and diltiazem without recurrence of atrial fibrillation.    This patients CHA2DS2-VASc Score and unadjusted Ischemic Stroke Rate (% per year) is equal to 4.8 % stroke rate/year from a score of 4  Above score calculated as 1 point each if present [CHF, HTN, DM, Vascular=MI/PAD/Aortic Plaque, Age if 65-74, or Male] Above score calculated as 2 points each if present [Age > 75, or  Stroke/TIA/TE]  5. Hypertension: Currently well controlled  6.  Lower extremity edema: Improved with Lasix.  No further edema noted.  7.  Weakness and fatigue: At this point it is unclear to me as to the cause of his weakness and fatigue.  I Catia Todorov discuss this with his primary cardiologist.  Current medicines are reviewed at length with the patient today.   The patient does not have concerns regarding his medicines.  The following changes were made today: None  Labs/ tests ordered today include:  No orders of the defined types were placed in this encounter.    Disposition:   FU with Kortni Hasten 6 months  Signed, Dewey Viens [name], MD  07/16/2018 8:41 AM     [facility] [address] [place] [address] [phone] (office) [phone] (fax)

## 2018-07-23 ENCOUNTER — Ambulatory Visit: Payer: Medicare Other | Admitting: Internal Medicine

## 2018-07-23 ENCOUNTER — Encounter: Payer: Self-pay | Admitting: Internal Medicine

## 2018-07-23 VITALS — BP 128/76 | HR 82 | Ht 65.0 in | Wt 250.8 lb

## 2018-07-23 DIAGNOSIS — I4891 Unspecified atrial fibrillation: Secondary | ICD-10-CM | POA: Diagnosis not present

## 2018-07-23 DIAGNOSIS — R0683 Snoring: Secondary | ICD-10-CM | POA: Diagnosis not present

## 2018-07-23 DIAGNOSIS — G4719 Other hypersomnia: Secondary | ICD-10-CM

## 2018-07-23 MED ORDER — SPIRONOLACTONE 25 MG PO TABS: 25.0000 mg | ORAL_TABLET | Freq: Every day | ORAL | 3 refills | Status: DC

## 2018-07-23 MED ORDER — RIVAROXABAN 20 MG PO TABS: ORAL_TABLET | ORAL | 5 refills | Status: DC

## 2018-07-23 MED ORDER — PANTOPRAZOLE SODIUM 40 MG PO TBEC: 40.0000 mg | DELAYED_RELEASE_TABLET | Freq: Every day | ORAL | 3 refills | Status: DC

## 2018-07-23 NOTE — Patient Instructions (Signed)
Medication Instructions:  Your Physician recommend you continue on your current medication as directed.    If you need a refill on your cardiac medications before your next appointment, please call your pharmacy.   Lab work: None  Testing/Procedures: Your physician has recommended that you have a sleep study. This test records several body functions during sleep, including: brain activity, eye movement, oxygen and carbon dioxide blood levels, heart rate and rhythm, breathing rate and rhythm, the flow of air through your mouth and nose, snoring, body muscle movements, and chest and belly movement. [facility]   Follow-Up: At [facility], you and your health needs are our priority.  As part of our continuing mission to provide you with exceptional heart care, we have created designated Provider Care Teams.  These Care Teams include your primary Cardiologist (physician) and Advanced Practice Providers (APPs -  Physician Assistants and Nurse Practitioners) who all work together to provide you with the care you need, when you need it. You will need a follow up appointment in 6 months.  Please call our office 2 months in advance to schedule this appointment.  You may see Dr. Debara Pickett or one of the following Advanced Practice Providers on your designated Care Team: Almyra Deforest, [place] . Fabian Sharp, PA-C

## 2018-07-23 NOTE — Progress Notes (Addendum)
OFFICE NOTE  Chief Complaint:  Follow-up  Primary Care Physician: [name], MD  HPI:  Derrick Alexander is a pleasant 73-year-old male with no significant past medical history. He only takes aspirin occasionally as needed. Unfortunately he's a 60 year smoker that currently smoking. Recently he's had 3 different episodes of what seemed to be brief syncope. All of the episodes were at rest and he was seated at the time. His wife said that he was talking with her and then briefly seem to lose consciousness but then was easily awakened and was not confused. He did not slump over lose control of bowel or bladder function. There was no seizure-like activity. He reported some mild prodrome to the symptoms. In fact he felt some tingling and numbness around his scalp that felt like he was wearing a tight baseball cap as well as some graying of his vision. He underwent carotid Dopplers 2 days ago and those results are pending. Of note his blood pressure was mildly elevated today but he has no history of hypertension in fact he was thought to be possibly hypotensive or orthostatic. He denies any chest pain or worsening shortness of breath. His EKG however shows left bundle branch pattern as well as ST and T wave abnormalities laterally with 1 mm of horizontal ST depression at rest concerning for ischemia.  Mr. Junkin returns to follow-up on his monitor. Monitor shows NSVT up 16 beat runs and periods of ventricular escape rhythm in the 30's. Either could explain syncope. His nuclear stress test was negative for ischemia with normal LV function. Suspect he will likely need AICD/PPM. I contacted Mr. Burningham to recommend an urgent referral but he wished to discuss it further with me in the office first. He understands that these arrhythmias could be life-threatening.  07/23/2018  Mr. Conrad is seen today in follow-up.  I am not seen him in the last couple years when he was initially having ventricular tachycardia  and I referred him to Dr. Curt Bears.  He has been managed there and fortunately is had no other significant VT.  He does have a EF and has been appropriately anticoagulated and managed for that.  He is maintaining sinus rhythm.  He has been referred back to me for general cardiology needs.  He does have morbid obesity, recently having significant weight gain after he stopped smoking about a year ago.  He remains on the nicotine patch.  EKG is stable showing bifascicular block but normal sinus rhythm.  Blood pressure is at goal today.  His cholesterol is elevated based on numbers in June 2019.  His total cholesterol is 219, HDL 43, LDL 135 and triglycerides 205.  Hemoglobin A1c of 6.0.  Additionally, he does have a history of snoring and witnessed apnea.  This is persisted and he has significant daytime fatigue and somnolence.  His EPWSS score is 17, highly suggestive of sleep apnea.  He did seem interested in a sleep study.  PMHx: Past Medical History:  Diagnosis Date  . A-fib ([place])   . Abnormal EKG 06/18/2015  . COPD (chronic obstructive pulmonary disease) ([place])   . COPD with exacerbation ([place])   . Faintness 06/18/2015  . Hypertension   . LBBB (left bundle branch block) 06/18/2015  . Obesity   . Syncope 08/06/2015  . Tobacco use   . V-tach ([place]) 08/06/2015    No past surgical history on file.  FAMHx:  Family History  Problem Relation Age of Onset  . Heart  disease Brother   . COPD Brother   . Diabetes Mother     SOCHx:   reports that he quit smoking about 13 months ago. His smoking use included cigarettes. He has a 120.00 pack-year smoking history. He has never used smokeless tobacco. He reports that he does not drink alcohol or use drugs.  ALLERGIES:  No Known Allergies  ROS: Pertinent items noted in HPI and remainder of comprehensive ROS otherwise negative.  HOME MEDS: Current Outpatient Medications  Medication Sig Dispense Refill  . famotidine (PEPCID) 20 MG tablet Take 20 mg by  mouth at bedtime.     . furosemide (LASIX) 40 MG tablet Take 1.5 tablets (60 mg total) by mouth 2 (two) times daily. 270 tablet 3  . irbesartan (AVAPRO) 300 MG tablet Take 1 tablet (300 mg total) by mouth daily. 90 tablet 3  . nicotine (NICODERM CQ - DOSED IN MG/24 HOURS) 14 mg/24hr patch Place 14 mg onto the skin daily.    . pantoprazole (PROTONIX) 40 MG tablet Take 1 tablet (40 mg total) by mouth daily. 90 tablet 3  . rivaroxaban (XARELTO) 20 MG TABS tablet TAKE 1 TABLET BY MOUTH ONCE DAILY WITH SUPPER 30 tablet 5  . spironolactone (ALDACTONE) 25 MG tablet Take 1 tablet (25 mg total) by mouth daily. 90 tablet 3   No current facility-administered medications for this visit.     LABS/IMAGING: No results found for this or any previous visit (from the past 48 hour(s)). No results found.  WEIGHTS: Wt Readings from Last 3 Encounters:  07/23/18 250 lb 12.8 oz (113.8 kg)  07/16/18 247 lb (112 kg)  05/10/18 244 lb 9.6 oz (110.9 kg)    VITALS: BP 128/76   Pulse 82   Ht 5\' 5"  (1.651 m)   Wt 250 lb 12.8 oz (113.8 kg)   BMI 41.74 kg/m   EXAM: General appearance: alert, no distress and morbidly obese Neck: no carotid bruit, no JVD and thyroid not enlarged, symmetric, no tenderness/mass/nodules Lungs: clear to auscultation bilaterally Heart: regular rate and rhythm, S1, S2 normal and systolic murmur: early systolic 2/6, crescendo at 2nd right intercostal space Abdomen: soft, non-tender; bowel sounds normal; no masses,  no organomegaly Extremities: extremities normal, atraumatic, no cyanosis or edema Pulses: 2+ and symmetric Skin: Skin color, texture, turgor normal. No rashes or lesions Neurologic: Grossly normal Psych: Pleasant  EKG: Normal sinus rhythm 82, bifascicular block-personally reviewed  ASSESSMENT: 1. NSVT 2. PAF - CHADSVASC score of 4 3. Bradycardia - ventricular escape in the 30's 4. Syncope x 3 5. HTN 6. Abnormal EKG with left bundle branch / LAFB  pattern 7. Tobacco abuse 8. Snoring, witnessed apnea -EPWSS of 17  PLAN: 1.   Mr. Deremer has a history of nonsustained VT which is quiescent and recently PAF.  He is on Xarelto for anticoagulation.  His blood pressure is well controlled.  He does have a history of snoring and witnessed apnea and is morbidly obese.  He is at high risk for sleep apnea.  He is not currently on any therapy for this.  He is followed in pulmonary by Dr. Melvyn Novas.  He is interested in a sleep study and I will arrange for that today.  We also talked about dietary modifications to lose weight and I may consider referring him to [facility] weight management program if he is interested.  Follow-up with me in 6 months.   Pixie Casino, MD, [facility], [place]  Director of the [place] of the American Board of Clinical Lipidology Attending Cardiologist  Direct Dial: [phone]  Fax: [phone]  Website:  [email or web address]  07/23/2018, 10:13 AM

## 2018-07-24 ENCOUNTER — Telehealth: Payer: Self-pay | Admitting: *Deleted

## 2018-07-24 NOTE — Telephone Encounter (Signed)
Patient and wife notified of sleep study appointment scheduled for 09/09/18 @ [facility]. Per [facility] web portal no PA is required.

## 2018-07-24 NOTE — Telephone Encounter (Signed)
-----   Message from Meryl Crutch, RN sent at 07/23/2018  9:56 AM EST ----- Pt has an order for sleep study.

## 2018-08-22 ENCOUNTER — Other Ambulatory Visit: Payer: Self-pay | Admitting: Cardiology

## 2018-08-22 NOTE — Telephone Encounter (Signed)
Xarelto 20mg  refill request received; pt is 73 yrs old, wt-113.8kg, Crea-0.98 on 05/17/2018, last seen by Dr. Debara Pickett on 07/23/2018, CrCl-108.106ml/min. Refill sent.

## 2018-09-09 ENCOUNTER — Encounter (HOSPITAL_BASED_OUTPATIENT_CLINIC_OR_DEPARTMENT_OTHER): Payer: Medicare Other

## 2018-10-04 DIAGNOSIS — H524 Presbyopia: Secondary | ICD-10-CM | POA: Diagnosis not present

## 2018-10-04 DIAGNOSIS — H5203 Hypermetropia, bilateral: Secondary | ICD-10-CM | POA: Diagnosis not present

## 2018-10-04 DIAGNOSIS — H2513 Age-related nuclear cataract, bilateral: Secondary | ICD-10-CM | POA: Diagnosis not present

## 2018-10-16 ENCOUNTER — Telehealth: Payer: Self-pay | Admitting: Internal Medicine

## 2018-10-16 NOTE — Telephone Encounter (Signed)
New Message    Pts wife is calling and is wondering if he can have the sleep study done at home   Please call

## 2018-10-23 ENCOUNTER — Other Ambulatory Visit: Payer: Self-pay | Admitting: Internal Medicine

## 2018-10-23 DIAGNOSIS — R400 Somnolence: Secondary | ICD-10-CM

## 2018-10-23 DIAGNOSIS — E669 Obesity, unspecified: Secondary | ICD-10-CM

## 2018-10-23 DIAGNOSIS — I480 Paroxysmal atrial fibrillation: Secondary | ICD-10-CM

## 2018-10-23 NOTE — Telephone Encounter (Signed)
Returned a call to patient informing her that the physician prefers in lab study. HST doesn't give AHI during REM sleep. She voiced understanding and plans to keep appointment.

## 2018-11-07 DIAGNOSIS — I10 Essential (primary) hypertension: Secondary | ICD-10-CM | POA: Diagnosis not present

## 2018-11-07 DIAGNOSIS — E0590 Thyrotoxicosis, unspecified without thyrotoxic crisis or storm: Secondary | ICD-10-CM | POA: Diagnosis not present

## 2018-11-07 DIAGNOSIS — E782 Mixed hyperlipidemia: Secondary | ICD-10-CM | POA: Diagnosis not present

## 2018-11-18 ENCOUNTER — Encounter (HOSPITAL_BASED_OUTPATIENT_CLINIC_OR_DEPARTMENT_OTHER): Payer: Medicare Other

## 2018-12-12 DIAGNOSIS — J449 Chronic obstructive pulmonary disease, unspecified: Secondary | ICD-10-CM | POA: Diagnosis not present

## 2018-12-12 DIAGNOSIS — Z23 Encounter for immunization: Secondary | ICD-10-CM | POA: Diagnosis not present

## 2018-12-12 DIAGNOSIS — I480 Paroxysmal atrial fibrillation: Secondary | ICD-10-CM | POA: Diagnosis not present

## 2018-12-12 DIAGNOSIS — Z0001 Encounter for general adult medical examination with abnormal findings: Secondary | ICD-10-CM | POA: Diagnosis not present

## 2018-12-16 DIAGNOSIS — I10 Essential (primary) hypertension: Secondary | ICD-10-CM | POA: Diagnosis not present

## 2018-12-16 DIAGNOSIS — I480 Paroxysmal atrial fibrillation: Secondary | ICD-10-CM | POA: Diagnosis not present

## 2018-12-16 DIAGNOSIS — R7303 Prediabetes: Secondary | ICD-10-CM | POA: Diagnosis not present

## 2018-12-16 DIAGNOSIS — Z713 Dietary counseling and surveillance: Secondary | ICD-10-CM | POA: Diagnosis not present

## 2018-12-19 DIAGNOSIS — Z0389 Encounter for observation for other suspected diseases and conditions ruled out: Secondary | ICD-10-CM | POA: Diagnosis not present

## 2018-12-19 DIAGNOSIS — R0989 Other specified symptoms and signs involving the circulatory and respiratory systems: Secondary | ICD-10-CM | POA: Diagnosis not present

## 2018-12-27 DIAGNOSIS — G4733 Obstructive sleep apnea (adult) (pediatric): Secondary | ICD-10-CM | POA: Diagnosis not present

## 2019-01-23 DIAGNOSIS — E782 Mixed hyperlipidemia: Secondary | ICD-10-CM | POA: Diagnosis not present

## 2019-01-29 DIAGNOSIS — G4733 Obstructive sleep apnea (adult) (pediatric): Secondary | ICD-10-CM | POA: Diagnosis not present

## 2019-01-29 DIAGNOSIS — E7800 Pure hypercholesterolemia, unspecified: Secondary | ICD-10-CM | POA: Diagnosis not present

## 2019-01-29 DIAGNOSIS — Z23 Encounter for immunization: Secondary | ICD-10-CM | POA: Diagnosis not present

## 2019-01-29 DIAGNOSIS — I10 Essential (primary) hypertension: Secondary | ICD-10-CM | POA: Diagnosis not present

## 2019-02-03 ENCOUNTER — Ambulatory Visit (INDEPENDENT_AMBULATORY_CARE_PROVIDER_SITE_OTHER): Payer: Medicare Other | Admitting: Internal Medicine

## 2019-02-03 ENCOUNTER — Other Ambulatory Visit: Payer: Self-pay

## 2019-02-03 VITALS — BP 116/64 | HR 64 | Ht 65.0 in | Wt 244.0 lb

## 2019-02-03 DIAGNOSIS — I4891 Unspecified atrial fibrillation: Secondary | ICD-10-CM

## 2019-02-03 DIAGNOSIS — I480 Paroxysmal atrial fibrillation: Secondary | ICD-10-CM

## 2019-02-03 DIAGNOSIS — I10 Essential (primary) hypertension: Secondary | ICD-10-CM | POA: Diagnosis not present

## 2019-02-03 NOTE — Patient Instructions (Signed)
Medication Instructions:  Your physician recommends that you continue on your current medications as directed. Please refer to the Current Medication list given to you today.  If you need a refill on your cardiac medications before your next appointment, please call your pharmacy.   Testing/Procedures: We have reached out to [place] (sleep coordinator) to contact you about your home sleep study  Follow-Up: At [facility], you and your health needs are our priority.  As part of our continuing mission to provide you with exceptional heart care, we have created designated Provider Care Teams.  These Care Teams include your primary Cardiologist (physician) and Advanced Practice Providers (APPs -  Physician Assistants and Nurse Practitioners) who all work together to provide you with the care you need, when you need it. You will need a follow up appointment in 12 months.  Please call our office 2 months in advance to schedule this appointment.  You may see Pixie Casino, MD or one of the following Advanced Practice Providers on your designated Care Team: [place], [place] . Fabian Sharp, PA-C  Any Other Special Instructions Will Be Listed Below (If Applicable).

## 2019-02-03 NOTE — Progress Notes (Signed)
OFFICE NOTE  Chief Complaint:  Follow-up  Primary Care Physician: [name], MD  HPI:  Derrick Alexander is a pleasant 73-year-old male with no significant past medical history. He only takes aspirin occasionally as needed. Unfortunately he's a 60 year smoker that currently smoking. Recently he's had 3 different episodes of what seemed to be brief syncope. All of the episodes were at rest and he was seated at the time. His wife said that he was talking with her and then briefly seem to lose consciousness but then was easily awakened and was not confused. He did not slump over lose control of bowel or bladder function. There was no seizure-like activity. He reported some mild prodrome to the symptoms. In fact he felt some tingling and numbness around his scalp that felt like he was wearing a tight baseball cap as well as some graying of his vision. He underwent carotid Dopplers 2 days ago and those results are pending. Of note his blood pressure was mildly elevated today but he has no history of hypertension in fact he was thought to be possibly hypotensive or orthostatic. He denies any chest pain or worsening shortness of breath. His EKG however shows left bundle branch pattern as well as ST and T wave abnormalities laterally with 1 mm of horizontal ST depression at rest concerning for ischemia.  Derrick Alexander returns to follow-up on his monitor. Monitor shows NSVT up 16 beat runs and periods of ventricular escape rhythm in the 30's. Either could explain syncope. His nuclear stress test was negative for ischemia with normal LV function. Suspect he will likely need AICD/PPM. I contacted Derrick Alexander to recommend an urgent referral but he wished to discuss it further with me in the office first. He understands that these arrhythmias could be life-threatening.  07/23/2018  Derrick Alexander is seen today in follow-up.  I am not seen him in the last couple years when he was initially having ventricular tachycardia  and I referred him to Dr. Curt Bears.  He has been managed there and fortunately is had no other significant VT.  He does have a EF and has been appropriately anticoagulated and managed for that.  He is maintaining sinus rhythm.  He has been referred back to me for general cardiology needs.  He does have morbid obesity, recently having significant weight gain after he stopped smoking about a year ago.  He remains on the nicotine patch.  EKG is stable showing bifascicular block but normal sinus rhythm.  Blood pressure is at goal today.  His cholesterol is elevated based on numbers in June 2019.  His total cholesterol is 219, HDL 43, LDL 135 and triglycerides 205.  Hemoglobin A1c of 6.0.  Additionally, he does have a history of snoring and witnessed apnea.  This is persisted and he has significant daytime fatigue and somnolence.  His EPWSS score is 17, highly suggestive of sleep apnea.  He did seem interested in a sleep study.  02/03/2019  Derrick Alexander is seen today in follow-up.  Overall he seems to be doing well.  He denies any recurrent arrhythmias.  EKG today shows sinus rhythm at 64.  He denies any chest pain.  I am not aware that he underwent his sleep study.  He apparently was set up for home sleep study.  There were a couple appointments that were canceled and he says his wife has more knowledge about what actually happened but he did recall having had a home sleep test.  Perhaps this was arranged through his primary care provider.  PMHx: Past Medical History:  Diagnosis Date  . A-fib ([place])   . Abnormal EKG 06/18/2015  . COPD (chronic obstructive pulmonary disease) ([place])   . COPD with exacerbation ([place])   . Faintness 06/18/2015  . Hypertension   . LBBB (left bundle branch block) 06/18/2015  . Obesity   . Syncope 08/06/2015  . Tobacco use   . V-tach ([place]) 08/06/2015    No past surgical history on file.  FAMHx:  Family History  Problem Relation Age of Onset  . Heart disease Brother   . COPD  Brother   . Diabetes Mother     SOCHx:   reports that he quit smoking about 19 months ago. His smoking use included cigarettes. He has a 120.00 pack-year smoking history. He has never used smokeless tobacco. He reports that he does not drink alcohol or use drugs.  ALLERGIES:  No Known Allergies  ROS: Pertinent items noted in HPI and remainder of comprehensive ROS otherwise negative.  HOME MEDS: Current Outpatient Medications  Medication Sig Dispense Refill  . famotidine (PEPCID) 20 MG tablet Take 20 mg by mouth at bedtime.     . furosemide (LASIX) 40 MG tablet Take 1.5 tablets (60 mg total) by mouth 2 (two) times daily. 270 tablet 3  . irbesartan (AVAPRO) 300 MG tablet Take 1 tablet (300 mg total) by mouth daily. 90 tablet 3  . rosuvastatin (CRESTOR) 20 MG tablet Take 20 mg by mouth daily.    Marland Kitchen spironolactone (ALDACTONE) 25 MG tablet Take 1 tablet (25 mg total) by mouth daily. 90 tablet 3  . XARELTO 20 MG TABS tablet TAKE 1 TABLET BY MOUTH ONCE DAILY WITH SUPPER 30 tablet 5   No current facility-administered medications for this visit.     LABS/IMAGING: No results found for this or any previous visit (from the past 48 hour(s)). No results found.  WEIGHTS: Wt Readings from Last 3 Encounters:  02/03/19 244 lb (110.7 kg)  07/23/18 250 lb 12.8 oz (113.8 kg)  07/16/18 247 lb (112 kg)    VITALS: BP 116/64   Pulse 64   Ht 5\' 5"  (1.651 m)   Wt 244 lb (110.7 kg)   BMI 40.60 kg/m   EXAM: General appearance: alert, no distress and morbidly obese Neck: no carotid bruit, no JVD and thyroid not enlarged, symmetric, no tenderness/mass/nodules Lungs: clear to auscultation bilaterally Heart: regular rate and rhythm, S1, S2 normal and systolic murmur: early systolic 2/6, crescendo at 2nd right intercostal space Abdomen: soft, non-tender; bowel sounds normal; no masses,  no organomegaly Extremities: extremities normal, atraumatic, no cyanosis or edema Pulses: 2+ and symmetric Skin:  Skin color, texture, turgor normal. No rashes or lesions Neurologic: Grossly normal Psych: Pleasant  EKG: Normal sinus rhythm at 64, incomplete right bundle branch block-personally reviewed  ASSESSMENT: 1. NSVT 2. PAF - CHADSVASC score of 4 3. Bradycardia - ventricular escape in the 30's 4. Syncope x 3 5. HTN 6. Abnormal EKG with left bundle branch / LAFB pattern 7. Tobacco abuse 8. Snoring, witnessed apnea -EPWSS of 17  PLAN: 1.   Derrick Alexander denies any recurrent VT or AF that he is aware of.  He has no bleeding problems on Xarelto.  He says that he had had a home sleep study.  I will need to get results of that since is not in our computer.  He may have been through his primary care provider.  Overall he seems  to be doing well.  Blood pressures well controlled today.  No changes made to his medicines.  He does have follow-up with Dr. Curt Bears in October.  Follow-up with me in 6 months.   Pixie Casino, MD, [facility], [place] Director of the Advanced Lipid Disorders &  Cardiovascular Risk Reduction Clinic Diplomate of the American Board of Clinical Lipidology Attending Cardiologist  Direct Dial: [phone]  Fax: [phone]  Website:  [email or web address] 02/03/2019, 10:38 AM

## 2019-02-04 ENCOUNTER — Telehealth: Payer: Self-pay | Admitting: Internal Medicine

## 2019-02-04 ENCOUNTER — Encounter: Payer: Self-pay | Admitting: Internal Medicine

## 2019-02-04 DIAGNOSIS — Z1211 Encounter for screening for malignant neoplasm of colon: Secondary | ICD-10-CM | POA: Diagnosis not present

## 2019-02-04 DIAGNOSIS — Z1212 Encounter for screening for malignant neoplasm of rectum: Secondary | ICD-10-CM | POA: Diagnosis not present

## 2019-02-04 NOTE — Telephone Encounter (Signed)
Wife aware OK to use testosterone gel. Will request copy of labs/sleep study

## 2019-02-04 NOTE — Telephone Encounter (Signed)
Yes, lets get the report. Ok to use testosterone supplementation.  Dr Lemmie Evens

## 2019-02-04 NOTE — Telephone Encounter (Signed)
Per Mariann Laster, CMA - patient had been scheduled for an in-lab sleep study. These were cancelled d/t COVID.   Spoke with patient's wife, patient had a home sleep study ordered by Dr. Shelia Media and he has moderate OSA. Dr. Shelia Media has sent his info to a DME company to be set up for CPAP.  Wife also states his testosterone is low. She wants to know if it is OK to use testosterone gel Rx'ed by Dr. Glennon Hamilton [facility])

## 2019-02-11 DIAGNOSIS — G4733 Obstructive sleep apnea (adult) (pediatric): Secondary | ICD-10-CM | POA: Diagnosis not present

## 2019-02-17 ENCOUNTER — Other Ambulatory Visit: Payer: Self-pay | Admitting: Gastroenterology

## 2019-02-17 ENCOUNTER — Telehealth: Payer: Self-pay | Admitting: Internal Medicine

## 2019-02-17 DIAGNOSIS — K625 Hemorrhage of anus and rectum: Secondary | ICD-10-CM | POA: Diagnosis not present

## 2019-02-17 DIAGNOSIS — R195 Other fecal abnormalities: Secondary | ICD-10-CM | POA: Diagnosis not present

## 2019-02-17 NOTE — Telephone Encounter (Signed)
Please comment on xarelto. 

## 2019-02-17 NOTE — Telephone Encounter (Signed)
   [facility] Pre-operative Risk Assessment    Request for surgical clearance:  1. What type of surgery is being performed? Colonoscopy    2. When is this surgery scheduled? 03/07/2019   3. What type of clearance is required (medical clearance vs. Pharmacy clearance to hold med vs. Both)? Both   4. Are there any medications that need to be held prior to surgery and how long? Xarelto   5. Practice name and name of physician performing surgery? Dr. Carol Ada @ [facility]   6. What is your office phone number [phone]    7.   What is your office fax number [phone]  8.   Anesthesia type (None, local, MAC, general) ? Propofol    Sheral Apley M 02/17/2019, 2:03 PM  _________________________________________________________________   (provider comments below)

## 2019-02-17 NOTE — Telephone Encounter (Signed)
Patient with diagnosis of afib on xarelto for anticoagulation.    Procedure: Colonoscopy Date of procedure: 03/07/2019  CHADS2-VASc score of  2 (HTN, AGE)  CrCl 87 ml/min  Per office protocol, patient can hold Xarelto for 1-2 days prior to procedure.

## 2019-02-18 NOTE — Telephone Encounter (Signed)
Left VMs.

## 2019-02-18 NOTE — Telephone Encounter (Signed)
   Primary Cardiologist: Pixie Casino, MD  Chart reviewed as part of pre-operative protocol coverage. Patient was contacted 02/18/2019 in reference to pre-operative risk assessment for pending surgery as outlined below.  Derrick Alexander was last seen on 02/03/19 by Dr. Debara Pickett.  Since that day, Derrick Alexander has done well. He does not have a history of MI or stroke. He can complete 4.0 METS without anginal symptoms.   Per our clinical pharmacist: Patient with diagnosis of afib on xarelto for anticoagulation.    Procedure: Colonoscopy Date of procedure: 03/07/2019  CHADS2-VASc score of  2 (HTN, AGE)  CrCl 87 ml/min Per office protocol, patient can hold Xarelto for 1-2 days prior to procedure  Therefore, based on ACC/AHA guidelines, the patient would be at acceptable risk for the planned procedure without further cardiovascular testing.   I will route this recommendation to the requesting party via Epic fax function and remove from pre-op pool.  Please call with questions.  [place], PA 02/18/2019, 10:57 AM

## 2019-03-04 ENCOUNTER — Other Ambulatory Visit (HOSPITAL_COMMUNITY)
Admission: RE | Admit: 2019-03-04 | Discharge: 2019-03-04 | Disposition: A | Discharge status: Home / Self Care | Payer: Medicare Other | Source: Ambulatory Visit | Attending: Gastroenterology | Admitting: Gastroenterology

## 2019-03-04 DIAGNOSIS — Z20828 Contact with and (suspected) exposure to other viral communicable diseases: Secondary | ICD-10-CM | POA: Insufficient documentation

## 2019-03-04 DIAGNOSIS — Z01812 Encounter for preprocedural laboratory examination: Secondary | ICD-10-CM | POA: Diagnosis not present

## 2019-03-05 LAB — NOVEL CORONAVIRUS, NAA (HOSP ORDER, SEND-OUT TO REF LAB; TAT 18-24 HRS): SARS-CoV-2, NAA: NOT DETECTED

## 2019-03-06 NOTE — Progress Notes (Signed)
Pre-op endo call attempted. No answer

## 2019-03-06 NOTE — Progress Notes (Signed)
Attempted to cal patient for pre op call. Unable to leave message.

## 2019-03-07 ENCOUNTER — Encounter (HOSPITAL_COMMUNITY)
Admission: RE | Disposition: A | Discharge status: Home / Self Care | Payer: Self-pay | Source: Home / Self Care | Attending: Gastroenterology

## 2019-03-07 ENCOUNTER — Ambulatory Visit (HOSPITAL_COMMUNITY): Payer: Medicare Other | Admitting: Anesthesiology

## 2019-03-07 ENCOUNTER — Ambulatory Visit (HOSPITAL_COMMUNITY)
Admission: RE | Admit: 2019-03-07 | Discharge: 2019-03-07 | Disposition: A | Discharge status: Home / Self Care | Payer: Medicare Other | Attending: Gastroenterology | Admitting: Gastroenterology

## 2019-03-07 ENCOUNTER — Other Ambulatory Visit: Payer: Self-pay

## 2019-03-07 ENCOUNTER — Encounter (HOSPITAL_COMMUNITY): Payer: Self-pay | Admitting: *Deleted

## 2019-03-07 DIAGNOSIS — K5730 Diverticulosis of large intestine without perforation or abscess without bleeding: Secondary | ICD-10-CM | POA: Insufficient documentation

## 2019-03-07 DIAGNOSIS — Z7901 Long term (current) use of anticoagulants: Secondary | ICD-10-CM | POA: Insufficient documentation

## 2019-03-07 DIAGNOSIS — D122 Benign neoplasm of ascending colon: Secondary | ICD-10-CM | POA: Diagnosis not present

## 2019-03-07 DIAGNOSIS — J449 Chronic obstructive pulmonary disease, unspecified: Secondary | ICD-10-CM | POA: Diagnosis not present

## 2019-03-07 DIAGNOSIS — Z6841 Body Mass Index (BMI) 40.0 and over, adult: Secondary | ICD-10-CM | POA: Insufficient documentation

## 2019-03-07 DIAGNOSIS — K635 Polyp of colon: Secondary | ICD-10-CM | POA: Diagnosis not present

## 2019-03-07 DIAGNOSIS — R195 Other fecal abnormalities: Secondary | ICD-10-CM | POA: Diagnosis not present

## 2019-03-07 DIAGNOSIS — K219 Gastro-esophageal reflux disease without esophagitis: Secondary | ICD-10-CM | POA: Diagnosis not present

## 2019-03-07 DIAGNOSIS — I10 Essential (primary) hypertension: Secondary | ICD-10-CM | POA: Insufficient documentation

## 2019-03-07 DIAGNOSIS — Z87891 Personal history of nicotine dependence: Secondary | ICD-10-CM | POA: Insufficient documentation

## 2019-03-07 DIAGNOSIS — I4891 Unspecified atrial fibrillation: Secondary | ICD-10-CM | POA: Insufficient documentation

## 2019-03-07 DIAGNOSIS — D123 Benign neoplasm of transverse colon: Secondary | ICD-10-CM | POA: Diagnosis not present

## 2019-03-07 HISTORY — PX: POLYPECTOMY: SHX5525

## 2019-03-07 HISTORY — PX: COLONOSCOPY WITH PROPOFOL: SHX5780

## 2019-03-07 SURGERY — COLONOSCOPY WITH PROPOFOL
Anesthesia: Monitor Anesthesia Care

## 2019-03-07 MED ORDER — SODIUM CHLORIDE 0.9 % IV SOLN: INTRAVENOUS | Status: DC

## 2019-03-07 MED ORDER — LACTATED RINGERS IV SOLN
INTRAVENOUS | Status: DC
  Administered 2019-03-07: 1000 mL via INTRAVENOUS

## 2019-03-07 MED ORDER — PROPOFOL 500 MG/50ML IV EMUL
INTRAVENOUS | Status: DC | PRN
  Administered 2019-03-07: 30 mg via INTRAVENOUS

## 2019-03-07 MED ORDER — PROPOFOL 500 MG/50ML IV EMUL
INTRAVENOUS | Status: DC | PRN
  Administered 2019-03-07: 125 ug/kg/min via INTRAVENOUS

## 2019-03-07 SURGICAL SUPPLY — 22 items

## 2019-03-07 NOTE — Op Note (Signed)
[facility] Patient Name: Derrick Alexander Procedure Date: 03/07/2019 MRN: [id number] Attending MD: Carol Ada , MD Date of Birth: 03-18-1946 CSN: [id number] Age: 73 Admit Type: Inpatient Procedure:                Colonoscopy Indications:              Positive Cologuard test Providers:                Carol Ada, MD, Cleda Daub, RN, Ladona Ridgel, Technician Referring MD:              Medicines:                Propofol per Anesthesia Complications:            No immediate complications. Estimated Blood Loss:     Estimated blood loss: none. Procedure:                Pre-Anesthesia Assessment:                           - Prior to the procedure, a History and Physical                            was performed, and patient medications and                            allergies were reviewed. The patient's tolerance of                            previous anesthesia was also reviewed. The risks                            and benefits of the procedure and the sedation                            options and risks were discussed with the patient.                            All questions were answered, and informed consent                            was obtained. Prior Anticoagulants: The patient has                            taken Xarelto (rivaroxaban), last dose was 2 days                            prior to procedure. ASA Grade Assessment: III - A                            patient with severe systemic disease. After                            reviewing  the risks and benefits, the patient was                            deemed in satisfactory condition to undergo the                            procedure.                           - Sedation was administered by an anesthesia                            professional. Deep sedation was attained.                           After obtaining informed consent, the colonoscope                            was  passed under direct vision. Throughout the                            procedure, the patient's blood pressure, pulse, and                            oxygen saturations were monitored continuously. The                            [id number]) Olympus pediatric colonscope                            was introduced through the anus and advanced to the                            the cecum, identified by appendiceal orifice and                            ileocecal valve. The colonoscopy was performed                            without difficulty. The patient tolerated the                            procedure well. The quality of the bowel                            preparation was excellent. The ileocecal valve,                            appendiceal orifice, and rectum were photographed. Scope In: 1:27:35 PM Scope Out: 1:51:44 PM Scope Withdrawal Time: 0 hours 19 minutes 48 seconds  Total Procedure Duration: 0 hours 24 minutes 9 seconds  Findings:      Three sessile polyps were found in the transverse colon and ascending       colon. The polyps were 2 to 3 mm in size. These polyps were  removed with       a cold snare. Resection and retrieval were complete.      Scattered small-mouthed diverticula were found in the sigmoid colon. Impression:               - Three 2 to 3 mm polyps in the transverse colon                            and in the ascending colon, removed with a cold                            snare. Resected and retrieved.                           - Diverticulosis in the sigmoid colon. Moderate Sedation:      Not Applicable - Patient had care per Anesthesia. Recommendation:           - Patient has a contact number available for                            emergencies. The signs and symptoms of potential                            delayed complications were discussed with the                            patient. Return to normal activities tomorrow.                             Written discharge instructions were provided to the                            patient.                           - Resume previous diet.                           - Continue present medications.                           - Await pathology results.                           - Repeat colonoscopy in 4-7 years for surveillance,                            if clinically appropriate.                           - Restart Xarelto. Procedure Code(s):        --- Professional ---                           [phone], Colonoscopy, flexible; with removal of  tumor(s), polyp(s), or other lesion(s) by snare                            technique Diagnosis Code(s):        --- Professional ---                           K63.5, Polyp of colon                           R19.5, Other fecal abnormalities                           K57.30, Diverticulosis of large intestine without                            perforation or abscess without bleeding CPT copyright 2019 [employer] Association. All rights reserved. The codes documented in this report are preliminary and upon coder review may  be revised to meet current compliance requirements. Carol Ada, MD Carol Ada, MD 03/07/2019 1:57:09 PM This report has been signed electronically. Number of Addenda: 0

## 2019-03-07 NOTE — Discharge Instructions (Signed)

## 2019-03-07 NOTE — H&P (Signed)
  Derrick Alexander HPI: The patient was positive for Cologuard and he is on Xarelto.  Past Medical History:  Diagnosis Date  . A-fib (Derrick Alexander)   . Abnormal EKG 06/18/2015  . COPD (chronic obstructive pulmonary disease) (Derrick Alexander)   . COPD with exacerbation (Derrick Alexander)   . Faintness 06/18/2015  . Hypertension   . LBBB (left bundle branch block) 06/18/2015  . Obesity   . Syncope 08/06/2015  . Tobacco use   . V-tach (Derrick Alexander) 08/06/2015    History reviewed. No pertinent surgical history.  Family History  Problem Relation Age of Onset  . Heart disease Brother   . COPD Brother   . Diabetes Mother     Social History:  reports that he quit smoking about 20 months ago. His smoking use included cigarettes. He has a 120.00 pack-year smoking history. He has never used smokeless tobacco. He reports that he does not drink alcohol or use drugs.  Allergies: No Known Allergies  Medications:  Scheduled:  Continuous: . sodium chloride    . lactated ringers 1,000 mL (03/07/19 1047)    No results found for this or any previous visit (from the past 24 hour(s)).   No results found.  ROS:  As stated above in the HPI otherwise negative.  Blood pressure (!) 141/60, pulse 75, temperature 98 F (36.7 C), temperature source Oral, resp. rate 16, height 5\' 5"  (1.651 m), weight 111.6 kg, SpO2 100 %.    PE: Gen: NAD, Alert and Oriented HEENT:  Derrick Alexander/AT, EOMI, thick neck Neck: Supple, no LAD Lungs: CTA Bilaterally CV: RRR without M/G/R ABM: Soft, NTND, +BS, morbidly obese. Ext: No C/C/E  Assessment/Plan: 1) + Cologuard - colonoscopy.  Derrick Alexander D 03/07/2019, 1:15 PM

## 2019-03-07 NOTE — Anesthesia Preprocedure Evaluation (Addendum)
Anesthesia Evaluation  Patient identified by MRN, date of birth, ID band Patient awake    Reviewed: Allergy & Precautions, NPO status , Patient's Chart, lab work & pertinent test results  Airway Mallampati: II  TM Distance: >3 FB Neck ROM: Full    Dental no notable dental hx. (+) Edentulous Upper, Edentulous Lower   Pulmonary COPD, former smoker,    Pulmonary exam normal breath sounds clear to auscultation       Cardiovascular hypertension, + DOE  Normal cardiovascular exam+ dysrhythmias Atrial Fibrillation and Ventricular Tachycardia  Rhythm:Regular Rate:Normal  Known LBBB  TTE 2019 EF 60-65%, G1DD, valves ok  Stress Test 2017 The left ventricular ejection fraction is normal (55-65%). Nuclear stress EF: 57%. There was no ST segment deviation noted during stress. The study is normal  Holter Monitor 2017 Monitor shows NSVT up 16 beat runs and periods of ventricular escape rhythm in the 30's.  Either could explain syncope. Please refer to EP asap for evaluation and management. His nuclear stress test was negative for ischemia with normal LV function. Hesitant to use b-blocker due to bradycardia. Will likely need AICD/PPM   Neuro/Psych negative neurological ROS  negative psych ROS   GI/Hepatic Neg liver ROS, GERD  Medicated,  Endo/Other  Morbid obesity  Renal/GU negative Renal ROS  negative genitourinary   Musculoskeletal negative musculoskeletal ROS (+)   Abdominal   Peds  Hematology  (+) Blood dyscrasia (on xarelto, last dose 10/14), ,   Anesthesia Other Findings   Reproductive/Obstetrics                            Anesthesia Physical Anesthesia Plan  ASA: III  Anesthesia Plan: MAC   Post-op Pain Management:    Induction: Intravenous  PONV Risk Score and Plan: Propofol infusion and Treatment may vary due to age or medical condition  Airway Management Planned: Natural  Airway  Additional Equipment:   Intra-op Plan:   Post-operative Plan:   Informed Consent: I have reviewed the patients History and Physical, chart, labs and discussed the procedure including the risks, benefits and alternatives for the proposed anesthesia with the patient or authorized representative who has indicated his/her understanding and acceptance.     Dental advisory given  Plan Discussed with: CRNA  Anesthesia Plan Comments:         Anesthesia Quick Evaluation

## 2019-03-07 NOTE — Transfer of Care (Signed)
Immediate Anesthesia Transfer of Care Note  Patient: Derrick Alexander  Procedure(s) Performed: COLONOSCOPY WITH PROPOFOL (N/A ) POLYPECTOMY  Patient Location: PACU  Anesthesia Type:MAC  Level of Consciousness: sedated, patient cooperative and responds to stimulation  Airway & Oxygen Therapy: Patient Spontanous Breathing and Patient connected to face mask oxygen  Post-op Assessment: Report given to RN and Post -op Vital signs reviewed and stable  Post vital signs: Reviewed and stable  Last Vitals:  Vitals Value Taken Time  BP    Temp    Pulse    Resp    SpO2      Last Pain:  Vitals:   03/07/19 1030  TempSrc: Oral  PainSc: 0-No pain         Complications: No apparent anesthesia complications

## 2019-03-10 LAB — SURGICAL PATHOLOGY

## 2019-03-11 ENCOUNTER — Other Ambulatory Visit: Payer: Self-pay

## 2019-03-11 ENCOUNTER — Encounter (INDEPENDENT_AMBULATORY_CARE_PROVIDER_SITE_OTHER): Payer: Self-pay

## 2019-03-11 ENCOUNTER — Telehealth: Payer: Self-pay | Admitting: Internal Medicine

## 2019-03-11 ENCOUNTER — Ambulatory Visit: Payer: Medicare Other | Admitting: Cardiology

## 2019-03-11 ENCOUNTER — Encounter: Payer: Self-pay | Admitting: Cardiology

## 2019-03-11 VITALS — BP 142/68 | HR 93 | Ht 65.0 in | Wt 242.8 lb

## 2019-03-11 DIAGNOSIS — I480 Paroxysmal atrial fibrillation: Secondary | ICD-10-CM

## 2019-03-11 MED ORDER — FLECAINIDE ACETATE 100 MG PO TABS: 100.0000 mg | ORAL_TABLET | Freq: Two times a day (BID) | ORAL | 3 refills | Status: DC

## 2019-03-11 MED ORDER — DILTIAZEM HCL ER COATED BEADS 120 MG PO CP24: 120.0000 mg | ORAL_CAPSULE | Freq: Every day | ORAL | 3 refills | Status: DC

## 2019-03-11 NOTE — Telephone Encounter (Signed)
New message   Patient;s wife states that pantoprazole (PROTONIX) 40 MG tablet needs to be taken off the list. Please call to discuss.

## 2019-03-11 NOTE — Progress Notes (Signed)
Electrophysiology Office Note   Date:  03/11/2019   ID:  Derrick Alexander, Derrick Alexander 1945-09-18, MRN [id number]  PCP:  [name], MD  Cardiologist:  Debara Pickett Primary Electrophysiologist:  Rudell Ortman [name], MD    No chief complaint on file.    History of Present Illness: Derrick Alexander is a 73 y.o. male who presents today for electrophysiology evaluation.  He has a history of hypertension, left bundle branch block, tobacco abuse with a 60-pack-year smoking history, and nonsustained VT.  He also has paroxysmal atrial fibrillation.  He had an episode of syncope and wore a cardiac monitor that showed no obvious causes for syncope but a 16 beat run of nonsustained VT.  He also had bradycardia into the 30s.  Today, denies symptoms of palpitations, chest pain, shortness of breath, orthopnea, PND, lower extremity edema, claudication, dizziness, presyncope, syncope, bleeding, or neurologic sequela. The patient is tolerating medications without difficulties.  He is having some weakness and fatigue that started either yesterday or today.  ECG today shows atrial fibrillation with rates in the 90s.  Unfortunately, he had a colonoscopy on Friday and has missed doses of his Xarelto.  He says that his weakness and fatigue is similar today as to what it is previously.   Past Medical History:  Diagnosis Date  . A-fib ([place])   . Abnormal EKG 06/18/2015  . COPD (chronic obstructive pulmonary disease) ([place])   . COPD with exacerbation ([place])   . Faintness 06/18/2015  . Hypertension   . LBBB (left bundle branch block) 06/18/2015  . Obesity   . Syncope 08/06/2015  . Tobacco use   . V-tach [facility]) 08/06/2015   Past Surgical History:  Procedure Laterality Date  . COLONOSCOPY WITH PROPOFOL N/A 03/07/2019   Procedure: COLONOSCOPY WITH PROPOFOL;  Surgeon: Carol Ada, MD;  Location: [facility] ENDOSCOPY;  Service: Endoscopy;  Laterality: N/A;  . POLYPECTOMY  03/07/2019   Procedure: POLYPECTOMY;  Surgeon: Carol Ada, MD;   Location: [facility] ENDOSCOPY;  Service: Endoscopy;;     Current Outpatient Medications  Medication Sig Dispense Refill  . Bioflavonoid Products (ESTER C PO) Take 1,000 mg by mouth daily.    . famotidine (PEPCID) 20 MG tablet Take 20 mg by mouth at bedtime.     . furosemide (LASIX) 40 MG tablet Take 1.5 tablets (60 mg total) by mouth 2 (two) times daily. 270 tablet 3  . irbesartan (AVAPRO) 300 MG tablet Take 1 tablet (300 mg total) by mouth daily. 90 tablet 3  . pantoprazole (PROTONIX) 40 MG tablet Take 40 mg by mouth daily.    . rosuvastatin (CRESTOR) 20 MG tablet Take 20 mg by mouth daily.    . sodium chloride (OCEAN) 0.65 % SOLN nasal spray Place 1 spray into both nostrils as needed for congestion.    Derrick Alexander Kitchen spironolactone (ALDACTONE) 25 MG tablet Take 1 tablet (25 mg total) by mouth daily. 90 tablet 3  . XARELTO 20 MG TABS tablet TAKE 1 TABLET BY MOUTH ONCE DAILY WITH SUPPER 30 tablet 5  . diltiazem (CARDIZEM CD) 120 MG 24 hr capsule Take 1 capsule (120 mg total) by mouth daily. 30 capsule 3  . [START ON 04/01/2019] flecainide (TAMBOCOR) 100 MG tablet Take 1 tablet (100 mg total) by mouth 2 (two) times daily. 60 tablet 3   No current facility-administered medications for this visit.     Allergies:   Patient has no known allergies.   Social History:  The patient  reports that he quit smoking about 20  months ago. His smoking use included cigarettes. He has a 120.00 pack-year smoking history. He has never used smokeless tobacco. He reports that he does not drink alcohol or use drugs.   Family History:  The patient's family history includes COPD in his brother; Diabetes in his mother; Heart disease in his brother.   ROS:  Please see the history of present illness.   Otherwise, review of systems is positive for none.   All other systems are reviewed and negative.   PHYSICAL EXAM: VS:  BP (!) 142/68   Pulse 93   Ht 5\' 5"  (1.651 m)   Wt 242 lb 12.8 oz (110.1 kg)   SpO2 96%   BMI 40.40 kg/m  ,  BMI Body mass index is 40.4 kg/m. GEN: Well nourished, well developed, in no acute distress  HEENT: normal  Neck: no JVD, carotid bruits, or masses Cardiac: iRRR; no murmurs, rubs, or gallops,no edema  Respiratory:  clear to auscultation bilaterally, normal work of breathing GI: soft, nontender, nondistended, + BS MS: no deformity or atrophy  Skin: warm and dry Neuro:  Strength and sensation are intact Psych: euthymic mood, full affect  EKG:  EKG is ordered today. Personal review of the ekg ordered shows atrial fibrillation, rate 95  Recent Labs: 04/09/2018: NT-Pro BNP 91 05/17/2018: BUN 16; Creatinine, Ser 0.98; Potassium 4.5; Sodium 135    Lipid Panel  No results found for: CHOL, TRIG, HDL, CHOLHDL, VLDL, LDLCALC, LDLDIRECT   Wt Readings from Last 3 Encounters:  03/11/19 242 lb 12.8 oz (110.1 kg)  03/07/19 246 lb (111.6 kg)  02/03/19 244 lb (110.7 kg)      Other studies Reviewed: Additional studies/ records that were reviewed today include: Spect 06/25/15  Review of the above records today demonstrates:   The left ventricular ejection fraction is normal (55-65%).  Nuclear stress EF: 57%.  There was no ST segment deviation noted during stress.  The study is normal.  Normal stress nuclear study with no ischemia or infarction; EF 57 with normal wall motion.  Tele monitor 06/17/14 Monitor shows NSVT up 16 beat runs and periods of ventricular escape rhythm in the 30's.   TTE 04/29/18 - Left ventricle: The cavity size was normal. Wall thickness was   normal. Systolic function was normal. The estimated ejection   fraction was in the range of 60% to 65%. Wall motion was normal;   there were no regional wall motion abnormalities. Doppler   parameters are consistent with abnormal left ventricular   relaxation (grade 1 diastolic dysfunction).   ASSESSMENT AND PLAN:  1.  NSVT: Currently on diltiazem without further episodes of SVT.  2. Bradycardia: No further  episodes noted.  3. Syncope: No further episodes noted.  4. Paroxysmal atrial fibrillation: Currently on Xarelto and diltiazem.  Unfortunately he is in atrial fibrillation today.  Due to that, we Samyah Bilbo plan to start him on flecainide.  He did miss a few doses of his Xarelto around a colonoscopy.  It is unclear to me as to when he went into atrial fibrillation and whether or not he was anticoagulated.  Due to that, we Andreya Lacks wait for 3 weeks prior to initiation.  After initiation we Meiya Wisler get an ECG and potentially plan for cardioversion if he has not gone back into normal rhythm.  This patients CHA2DS2-VASc Score and unadjusted Ischemic Stroke Rate (% per year) is equal to 4.8 % stroke rate/year from a score of 4  Above score calculated as 1 point  each if present [CHF, HTN, DM, Vascular=MI/PAD/Aortic Plaque, Age if 72-74, or Male] Above score calculated as 2 points each if present [Age > 75, or Stroke/TIA/TE]   5. Hypertension: Mildly elevated today.  This may improve with control of atrial fibrillation.  No changes.  Current medicines are reviewed at length with the patient today.   The patient does not have concerns regarding his medicines.  The following changes were made today: Start flecainide  Labs/ tests ordered today include:  Orders Placed This Encounter  Procedures  . EKG 12-Lead     Disposition:   FU with Arden Tinoco 6 months  Signed, Jery Hollern [name], MD  03/11/2019 10:50 AM     [facility] [address] [place] [address] [phone] (office) [phone] (fax)

## 2019-03-11 NOTE — Telephone Encounter (Signed)
Removed from list as requested Wife wanted to make sure Dr Curt Bears was aware since he was seen today. Will forward as requested

## 2019-03-11 NOTE — Anesthesia Postprocedure Evaluation (Signed)
Anesthesia Post Note  Patient: Dontay Harm  Procedure(s) Performed: COLONOSCOPY WITH PROPOFOL (N/A ) POLYPECTOMY     Patient location during evaluation: Endoscopy Anesthesia Type: MAC Level of consciousness: awake and alert Pain management: pain level controlled Vital Signs Assessment: post-procedure vital signs reviewed and stable Respiratory status: spontaneous breathing, nonlabored ventilation, respiratory function stable and patient connected to nasal cannula oxygen Cardiovascular status: blood pressure returned to baseline and stable Postop Assessment: no apparent nausea or vomiting Anesthetic complications: no    Last Vitals:  Vitals:   03/07/19 1410 03/07/19 1420  BP: (!) 141/87 (!) 141/87  Pulse: 76   Resp: 18 18  Temp:    SpO2: 100%     Last Pain:  Vitals:   03/07/19 1420  TempSrc:   PainSc: 0-No pain                 Natanel Snavely L Niza Soderholm

## 2019-03-11 NOTE — Patient Instructions (Addendum)
Medication Instructions:  Your physician has recommended you make the following change in your medication:  1. START Diltiazem 120 mg once a day 2. START Flecainide 100 mg twice a day -- START THIS MEDICATION IN 3 WEEKS ON NOVEMBER10TH   * If you need a refill on your cardiac medications before your next appointment, please call your pharmacy.   Labwork: None ordered  Testing/Procedures: None ordered  Follow-Up: You are scheduled for nurse vist EKG on 04/09/2019 @ 11:00 am  Your physician wants you to follow-up in: 3 months with a PA that works with Dr. Curt Bears. You will receive a reminder letter in the mail two months in advance. If you don't receive a letter, please call our office to schedule the follow-up appointment.  At [facility], you and your health needs are our priority.  As part of our continuing mission to provide you with exceptional heart care, we have created designated Provider Care Teams.  These Care Teams include your primary Cardiologist (physician) and Advanced Practice Providers (APPs -  Physician Assistants and Nurse Practitioners) who all work together to provide you with the care you need, when you need it.  You will need a follow up appointment in 6 months.  Please call our office 2 months in advance to schedule this appointment.  You may see Dr Curt Bears or one of the following Advanced Practice Providers on your designated Care Team:    Chanetta Marshall, NP  Tommye Standard, PA-C  Oda Kilts, [place]  Thank you for choosing [facility]!!   Trinidad Curet, RN [phone]  Any Other Special Instructions Will Be Listed Below (If Applicable).   Flecainide tablets What is this medicine? FLECAINIDE (FLEK a nide) is an antiarrhythmic drug. This medicine is used to prevent irregular heart rhythm. It can also slow down fast heartbeats called tachycardia. This medicine may be used for other purposes; ask your health care provider or pharmacist if you have  questions. COMMON BRAND NAME(S): Tambocor What should I tell my health care provider before I take this medicine? They need to know if you have any of these conditions:  abnormal levels of potassium in the blood  heart disease including heart rhythm and heart rate problems  kidney or liver disease  recent heart attack  an unusual or allergic reaction to flecainide, local anesthetics, other medicines, foods, dyes, or preservatives  pregnant or trying to get pregnant  breast-feeding How should I use this medicine? Take this medicine by mouth with a glass of water. Follow the directions on the prescription label. You can take this medicine with or without food. Take your doses at regular intervals. Do not take your medicine more often than directed. Do not stop taking this medicine suddenly. This may cause serious, heart-related side effects. If your doctor wants you to stop the medicine, the dose may be slowly lowered over time to avoid any side effects. Talk to your pediatrician regarding the use of this medicine in children. While this drug may be prescribed for children as young as 1 year of age for selected conditions, precautions do apply. Overdosage: If you think you have taken too much of this medicine contact a poison control center or emergency room at once. NOTE: This medicine is only for you. Do not share this medicine with others. What if I miss a dose? If you miss a dose, take it as soon as you can. If it is almost time for your next dose, take only that dose. Do  not take double or extra doses. What may interact with this medicine? Do not take this medicine with any of the following medications:  amoxapine  arsenic trioxide  certain antibiotics like clarithromycin, erythromycin, gatifloxacin, gemifloxacin, levofloxacin, moxifloxacin, sparfloxacin, or troleandomycin  certain antidepressants called tricyclic antidepressants like amitriptyline, imipramine, or nortriptyline   certain medicines to control heart rhythm like disopyramide, encainide, moricizine, procainamide, propafenone, and quinidine  cisapride  delavirdine  droperidol  haloperidol  [place]  imatinib  levomethadyl  maprotiline  medicines for malaria like chloroquine and halofantrine  pentamidine  phenothiazines like chlorpromazine, mesoridazine, prochlorperazine, thioridazine  pimozide  quinine  ranolazine  ritonavir  sertindole This medicine may also interact with the following medications:  cimetidine  dofetilide  medicines for angina or high blood pressure  medicines to control heart rhythm like amiodarone and digoxin  ziprasidone This list may not describe all possible interactions. Give your health care provider a list of all the medicines, herbs, non-prescription drugs, or dietary supplements you use. Also tell them if you smoke, drink alcohol, or use illegal drugs. Some items may interact with your medicine. What should I watch for while using this medicine? Visit your doctor or health care professional for regular checks on your progress. Because your condition and the use of this medicine carries some risk, it is a good idea to carry an identification card, necklace or bracelet with details of your condition, medications and doctor or health care professional. Check your blood pressure and pulse rate regularly. Ask your health care professional what your blood pressure and pulse rate should be, and when you should contact him or her. Your doctor or health care professional also may schedule regular blood tests and electrocardiograms to check your progress. You may get drowsy or dizzy. Do not drive, use machinery, or do anything that needs mental alertness until you know how this medicine affects you. Do not stand or sit up quickly, especially if you are an older patient. This reduces the risk of dizzy or fainting spells. Alcohol can make you more dizzy, increase  flushing and rapid heartbeats. Avoid alcoholic drinks. What side effects may I notice from receiving this medicine? Side effects that you should report to your doctor or health care professional as soon as possible:  chest pain, continued irregular heartbeats  difficulty breathing  swelling of the legs or feet  trembling, shaking  unusually weak or tired Side effects that usually do not require medical attention (report to your doctor or health care professional if they continue or are bothersome):  blurred vision  constipation  headache  nausea, vomiting  stomach pain This list may not describe all possible side effects. Call your doctor for medical advice about side effects. You may report side effects to FDA at 1-800-FDA-1088. Where should I keep my medicine? Keep out of the reach of children. Store at room temperature between 15 and 30 degrees C (59 and 86 degrees F). Protect from light. Keep container tightly closed. Throw away any unused medicine after the expiration date. NOTE: This sheet is a summary. It may not cover all possible information. If you have questions about this medicine, talk to your doctor, pharmacist, or health care provider.  2020 Elsevier/Gold Standard (2018-04-29 11:41:38)

## 2019-03-12 DIAGNOSIS — I10 Essential (primary) hypertension: Secondary | ICD-10-CM | POA: Diagnosis not present

## 2019-03-12 DIAGNOSIS — G4733 Obstructive sleep apnea (adult) (pediatric): Secondary | ICD-10-CM | POA: Diagnosis not present

## 2019-03-12 DIAGNOSIS — I480 Paroxysmal atrial fibrillation: Secondary | ICD-10-CM | POA: Diagnosis not present

## 2019-03-13 DIAGNOSIS — G4733 Obstructive sleep apnea (adult) (pediatric): Secondary | ICD-10-CM | POA: Diagnosis not present

## 2019-03-14 DIAGNOSIS — G4733 Obstructive sleep apnea (adult) (pediatric): Secondary | ICD-10-CM | POA: Diagnosis not present

## 2019-03-24 ENCOUNTER — Other Ambulatory Visit: Payer: Self-pay

## 2019-03-24 MED ORDER — RIVAROXABAN 20 MG PO TABS: ORAL_TABLET | ORAL | 5 refills | Status: DC

## 2019-04-02 ENCOUNTER — Telehealth: Payer: Self-pay | Admitting: Cardiology

## 2019-04-02 ENCOUNTER — Encounter (HOSPITAL_COMMUNITY): Payer: Self-pay | Admitting: Physician Assistant

## 2019-04-02 ENCOUNTER — Ambulatory Visit (HOSPITAL_COMMUNITY)
Admission: RE | Admit: 2019-04-02 | Discharge: 2019-04-02 | Disposition: A | Discharge status: Home / Self Care | Payer: Medicare Other | Source: Ambulatory Visit | Attending: Physician Assistant | Admitting: Physician Assistant

## 2019-04-02 ENCOUNTER — Other Ambulatory Visit: Payer: Self-pay

## 2019-04-02 VITALS — BP 134/60 | HR 52 | Ht 65.0 in | Wt 249.0 lb

## 2019-04-02 DIAGNOSIS — J441 Chronic obstructive pulmonary disease with (acute) exacerbation: Secondary | ICD-10-CM | POA: Insufficient documentation

## 2019-04-02 DIAGNOSIS — Z87891 Personal history of nicotine dependence: Secondary | ICD-10-CM | POA: Insufficient documentation

## 2019-04-02 DIAGNOSIS — Z6841 Body Mass Index (BMI) 40.0 and over, adult: Secondary | ICD-10-CM | POA: Insufficient documentation

## 2019-04-02 DIAGNOSIS — E669 Obesity, unspecified: Secondary | ICD-10-CM | POA: Diagnosis not present

## 2019-04-02 DIAGNOSIS — I10 Essential (primary) hypertension: Secondary | ICD-10-CM | POA: Insufficient documentation

## 2019-04-02 DIAGNOSIS — G4733 Obstructive sleep apnea (adult) (pediatric): Secondary | ICD-10-CM | POA: Insufficient documentation

## 2019-04-02 DIAGNOSIS — Z79899 Other long term (current) drug therapy: Secondary | ICD-10-CM | POA: Insufficient documentation

## 2019-04-02 DIAGNOSIS — I480 Paroxysmal atrial fibrillation: Secondary | ICD-10-CM | POA: Insufficient documentation

## 2019-04-02 DIAGNOSIS — D6869 Other thrombophilia: Secondary | ICD-10-CM | POA: Diagnosis not present

## 2019-04-02 NOTE — Telephone Encounter (Signed)
appt made for assessment at 1400 with Adline Peals

## 2019-04-02 NOTE — Progress Notes (Signed)
Primary Care Physician: [name], MD Primary Cardiologist: Dr Debara Pickett Primary Electrophysiologist: Dr Curt Bears Referring Physician: Dr Reino Bellis Derrick Alexander is a 73 y.o. male with a history of HTN, tobacco abuse, NSVT, and paroxysmal atrial fibrillation who presents for follow up in the [facility]. Patient was noted to be in afib at his routine follow up with Dr Curt Bears on 03/11/19. Patient had just had a colonoscopy prior to the visit and his Xarelto had been held for the procedure. After 3 weeks of uninterrupted anticoagulation, he was started on flecainide with a plan to cardiovert. Patient called [facility] 04/02/19 with symptoms of dizziness and SOB after 2 doses of the flecainide. His reported HR was 42. He is on Xarelto for a CHADS2VASC score of 4.  Today, he denies symptoms of palpitations, chest pain, orthopnea, PND, lower extremity edema, presyncope, syncope, bleeding, or neurologic sequela. The patient is tolerating medications without difficulties and is otherwise without complaint today.    Atrial Fibrillation Risk Factors:  he does have symptoms or diagnosis of sleep apnea. he is compliant with CPAP therapy. he does not have a history of rheumatic fever. he does not have a history of alcohol use. The patient does not have a history of early familial atrial fibrillation or other arrhythmias.  he has a BMI of Body mass index is 41.44 kg/m.Marland Kitchen Filed Weights   04/02/19 1359  Weight: 112.9 kg    Family History  Problem Relation Age of Onset  . Heart disease Brother   . COPD Brother   . Diabetes Mother      Atrial Fibrillation Management history:  Previous antiarrhythmic drugs: flecainide Previous cardioversions: none Previous ablations: none CHADS2VASC score: 4 Anticoagulation history: Xarelto   Past Medical History:  Diagnosis Date  . A-fib ([facility])   . Abnormal EKG 06/18/2015  . COPD (chronic obstructive pulmonary disease) ([place])    . COPD with exacerbation ([place])   . Faintness 06/18/2015  . Hypertension   . LBBB (left bundle branch block) 06/18/2015  . Obesity   . Syncope 08/06/2015  . Tobacco use   . V-tach [facility]) 08/06/2015   Past Surgical History:  Procedure Laterality Date  . COLONOSCOPY WITH PROPOFOL N/A 03/07/2019   Procedure: COLONOSCOPY WITH PROPOFOL;  Surgeon: Carol Ada, MD;  Location: [facility];  Service: Endoscopy;  Laterality: N/A;  . POLYPECTOMY  03/07/2019   Procedure: POLYPECTOMY;  Surgeon: Carol Ada, MD;  Location: [facility];  Service: Endoscopy;;    Current Outpatient Medications  Medication Sig Dispense Refill  . Bioflavonoid Products (ESTER C PO) Take 1,000 mg by mouth daily.    . famotidine (PEPCID) 20 MG tablet Take 20 mg by mouth at bedtime.     . furosemide (LASIX) 40 MG tablet Take 1.5 tablets (60 mg total) by mouth 2 (two) times daily. 270 tablet 3  . irbesartan (AVAPRO) 300 MG tablet Take 1 tablet (300 mg total) by mouth daily. 90 tablet 3  . rivaroxaban (XARELTO) 20 MG TABS tablet TAKE 1 TABLET BY MOUTH ONCE DAILY WITH SUPPER 30 tablet 5  . rosuvastatin (CRESTOR) 20 MG tablet Take 20 mg by mouth daily.    . sodium chloride (OCEAN) 0.65 % SOLN nasal spray Place 1 spray into both nostrils as needed for congestion.    Marland Kitchen spironolactone (ALDACTONE) 25 MG tablet Take 1 tablet (25 mg total) by mouth daily. 90 tablet 3   No current facility-administered medications for this encounter.     No  Known Allergies  Social History   Socioeconomic History  . Marital status: Married    Spouse name: Not on file  . Number of children: Not on file  . Years of education: Not on file  . Highest education level: Not on file  Occupational History  . Not on file  Social Needs  . Financial resource strain: Not on file  . Food insecurity    Worry: Not on file    Inability: Not on file  . Transportation needs    Medical: Not on file    Non-medical: Not on file  Tobacco Use  . Smoking  status: Former Smoker    Packs/day: 2.00    Years: 60.00    Pack years: 120.00    Types: Cigarettes    Quit date: 06/13/2017    Years since quitting: 1.8  . Smokeless tobacco: Never Used  Substance and Sexual Activity  . Alcohol use: No    Alcohol/week: 0.0 standard drinks  . Drug use: No  . Sexual activity: Not on file  Lifestyle  . Physical activity    Days per week: Not on file    Minutes per session: Not on file  . Stress: Not on file  Relationships  . Social Herbalist on phone: Not on file    Gets together: Not on file    Attends religious service: Not on file    Active member of club or organization: Not on file    Attends meetings of clubs or organizations: Not on file    Relationship status: Not on file  . Intimate partner violence    Fear of current or ex partner: Not on file    Emotionally abused: Not on file    Physically abused: Not on file    Forced sexual activity: Not on file  Other Topics Concern  . Not on file  Social History Narrative  . Not on file     ROS- All systems are reviewed and negative except as per the HPI above.  Physical Exam: Vitals:   04/02/19 1359  BP: 134/60  Pulse: (!) 52  SpO2: 97%  Weight: 112.9 kg  Height: 5\' 5"  (1.651 m)    GEN- The patient is well appearing obese male, alert and oriented x 3 today.   Head- normocephalic, atraumatic Eyes-  Sclera clear, conjunctiva pink Ears- hearing intact Oropharynx- clear Neck- supple  Lungs- Clear to ausculation bilaterally, normal work of breathing Heart- Regular rhythm, bradycardia, no rubs or gallops. 2/6 systolic murmur GI- soft, NT, ND, + BS Extremities- no clubbing, cyanosis. Trace bilateral edema MS- no significant deformity or atrophy Skin- no rash or lesion Psych- euthymic mood, full affect Neuro- strength and sensation are intact  Wt Readings from Last 3 Encounters:  04/02/19 112.9 kg  03/11/19 110.1 kg  03/07/19 111.6 kg    EKG today demonstrates  junctional rhythm HR 52, PVCs, QRS 102, QTc 444  Echo 04/29/2018 demonstrated  - Left ventricle: The cavity size was normal. Wall thickness was   normal. Systolic function was normal. The estimated ejection   fraction was in the range of 60% to 65%. Wall motion was normal;   there were no regional wall motion abnormalities. Doppler   parameters are consistent with abnormal left ventricular   relaxation (grade 1 diastolic dysfunction).  Epic records are reviewed at length today  Assessment and Plan:  1. Paroxysmal atrial fibrillation Patient recently started flecainide. Appears to have converted but now in  a junctional rhythm. D/w Dr Curt Bears. Will stop flecainide and diltiazem. Check ECG in 2 days.  Continue Xarelto 20 mg daily Patient may be an ablation candidate with flecainide failure.  This patients CHA2DS2-VASc Score and unadjusted Ischemic Stroke Rate (% per year) is equal to 4.8 % stroke rate/year from a score of 4  Above score calculated as 1 point each if present [CHF, HTN, DM, Vascular=MI/PAD/Aortic Plaque, Age if 65-74, or Male] Above score calculated as 2 points each if present [Age > 75, or Stroke/TIA/TE]   2. Obesity Body mass index is 41.44 kg/m. Lifestyle modification was discussed at length including regular exercise and weight reduction.  3. OSA The importance of adequate treatment of sleep apnea was discussed today in order to improve our ability to maintain sinus rhythm long term. Patient compliant with CPAP therapy.  4. HTN Stable, med changes as above.   Follow up in the AF clinic in 2 days for ECG.   [facility] [address] [place], [place] [address] [phone] 04/02/2019 2:32 PM

## 2019-04-02 NOTE — Telephone Encounter (Signed)
Pt c/o medication issue:  1. Name of Medication: flecainide (TAMBOCOR) 100 MG tablet  2. How are you currently taking this medication (dosage and times per day)? As directed, just started last night.   3. Are you having a reaction (difficulty breathing--STAT)? Yes, SOB  4. What is your medication issue? SOB and feeling dizzy even when laying down

## 2019-04-02 NOTE — Telephone Encounter (Signed)
Spoke with pts wife who reports he started Flecainide yesterday, he has been extremely dizzy/SOB today. Advised pt to hold Diltiazem for the time being/ BP of 103/52 and HR of 42.   Spoke with DOD, Dr. Radford Pax who suggested getting pt into AFib clinic to be evaluated.

## 2019-04-02 NOTE — Patient Instructions (Addendum)
STOP Diltiazem and Flecainide

## 2019-04-04 ENCOUNTER — Other Ambulatory Visit: Payer: Self-pay

## 2019-04-04 ENCOUNTER — Ambulatory Visit (HOSPITAL_COMMUNITY)
Admission: RE | Admit: 2019-04-04 | Discharge: 2019-04-04 | Disposition: A | Discharge status: Home / Self Care | Payer: Medicare Other | Source: Ambulatory Visit | Attending: Physician Assistant | Admitting: Physician Assistant

## 2019-04-04 VITALS — BP 118/64 | HR 69

## 2019-04-04 DIAGNOSIS — Z79899 Other long term (current) drug therapy: Secondary | ICD-10-CM | POA: Diagnosis not present

## 2019-04-04 DIAGNOSIS — I4891 Unspecified atrial fibrillation: Secondary | ICD-10-CM | POA: Insufficient documentation

## 2019-04-04 DIAGNOSIS — I4819 Other persistent atrial fibrillation: Secondary | ICD-10-CM

## 2019-04-04 NOTE — Progress Notes (Signed)
Patient returns today for follow up ECG after stopping flecainide. ECG today shows SR HR 69, LAFB, PR 192, QRS 106, QTc 439. Patient's symptoms of dizziness have resolved. F/u with Dr Macky Lower office per recall. We briefly discussed afib ablation today now that he has failed flecainide. He will consider.

## 2019-04-09 ENCOUNTER — Ambulatory Visit: Payer: Medicare Other

## 2019-04-13 DIAGNOSIS — G4733 Obstructive sleep apnea (adult) (pediatric): Secondary | ICD-10-CM | POA: Diagnosis not present

## 2019-04-21 DIAGNOSIS — G4733 Obstructive sleep apnea (adult) (pediatric): Secondary | ICD-10-CM | POA: Diagnosis not present

## 2019-04-21 DIAGNOSIS — I10 Essential (primary) hypertension: Secondary | ICD-10-CM | POA: Diagnosis not present

## 2019-05-12 ENCOUNTER — Other Ambulatory Visit: Payer: Self-pay

## 2019-05-12 MED ORDER — IRBESARTAN 300 MG PO TABS: 300.0000 mg | ORAL_TABLET | Freq: Every day | ORAL | 3 refills | Status: DC

## 2019-05-13 DIAGNOSIS — G4733 Obstructive sleep apnea (adult) (pediatric): Secondary | ICD-10-CM | POA: Diagnosis not present

## 2019-06-06 ENCOUNTER — Other Ambulatory Visit: Payer: Self-pay | Admitting: Cardiology

## 2019-06-13 DIAGNOSIS — G4733 Obstructive sleep apnea (adult) (pediatric): Secondary | ICD-10-CM | POA: Diagnosis not present

## 2019-06-16 ENCOUNTER — Other Ambulatory Visit: Payer: Self-pay | Admitting: Physician Assistant

## 2019-06-29 NOTE — Progress Notes (Addendum)
Cardiology Office Note Date:  06/30/2019  Patient ID:  Derrick Alexander Mar 07, 1946, MRN [id number] PCP:  [name], MD  Cardiologist:  Dr. Debara Pickett Electrophysiologist: Dr. Curt Bears    Chief Complaint:  planned 3 mo visit  History of Present Illness: Derrick Alexander is a 74 y.o. male with history of COPD, HTN, Afib, LBBB/LAFB morbid obesity, NSVT, OSA w/CPAP, obesity   He comes in today to be seen for Dr. Curt Bears.  Referred to him for h/o syncope, NSVT and SB 30's noted on a monitor. He last saw Dr. Curt Bears Oct 2020 , at that time he as was in Afib 90's, symptomatic with fatigue, weakness, had some missed doses of his [place] 2/2 a colonoscopy, planned to start flecainide after 3 weeks of uninterrupted a/c.  No recurrent syncope.  He is accompanied by  His wife who keeps track of his medicines and "his back up hearing".  All in all he is doing well.  Mentions that when he went to the hospital 3 years ago, he was told if he stopped smoking his breathing would get better, but despite stopping smoking, he doesn't feel like it has.  His wife mentions that in the last year they bth have slowed a lot, not doing nearly as much as they normally would given COVID restrictions both have gained weight and slowed down in general. The patient denies any kind of CP, palpitations or cardiac awareness.  When he saw Dr Curt Bears in Oct in Afib, he does not recall feeling poorly or having symptoms.  He in the summer was taking care of the lawn both riding and push mowing, and towards the end of push mowing would ned to rest to finish.  He denies any rest SOB, no difficulties with ADLs.  He has been found with severe OSA and using CPAP now. He was smoker his entire adult life until 3 years ago, his PFTs note only very mild obstruction and report Dr. Melvyn Novas told him he only needed to follow up PRN going forward.  He has not had any dizzy spells, no near syncope or syncope.  he denies any bleeding or sugns of  bleeding  AFib Diagnosed 2017 AAD Flecainide Nov 2020, stopped after 2 doses with reports of dizziness, EKG with developed junctional rhythm 40's   Past Medical History:  Diagnosis Date  . A-fib ([place])   . Abnormal EKG 06/18/2015  . COPD (chronic obstructive pulmonary disease) ([place])   . COPD with exacerbation ([place])   . Faintness 06/18/2015  . Hypertension   . LBBB (left bundle branch block) 06/18/2015  . Obesity   . Syncope 08/06/2015  . Tobacco use   . V-tach [facility]) 08/06/2015    Past Surgical History:  Procedure Laterality Date  . COLONOSCOPY WITH PROPOFOL N/A 03/07/2019   Procedure: COLONOSCOPY WITH PROPOFOL;  Surgeon: Carol Ada, MD;  Location: [facility];  Service: Endoscopy;  Laterality: N/A;  . POLYPECTOMY  03/07/2019   Procedure: POLYPECTOMY;  Surgeon: Carol Ada, MD;  [facility];  Service: Endoscopy;;    Current Outpatient Medications  Medication Sig Dispense Refill  . Bioflavonoid Products (ESTER C PO) Take 1,000 mg by mouth daily.    . famotidine (PEPCID) 20 MG tablet Take 20 mg by mouth at bedtime.     . furosemide (LASIX) 40 MG tablet TAKE 1 AND 1/2 TABLETS(60 MG) BY MOUTH TWICE DAILY 270 tablet 2  . irbesartan (AVAPRO) 300 MG tablet Take 1 tablet (300 mg total) by mouth daily. [place]  tablet 3  . rivaroxaban (XARELTO) 20 MG TABS tablet TAKE 1 TABLET BY MOUTH ONCE DAILY WITH SUPPER 30 tablet 5  . rosuvastatin (CRESTOR) 20 MG tablet Take 20 mg by mouth daily.    . sodium chloride (OCEAN) 0.65 % SOLN nasal spray Place 1 spray into both nostrils as needed for congestion.    Marland Kitchen spironolactone (ALDACTONE) 25 MG tablet Take 1 tablet (25 mg total) by mouth daily. 90 tablet 3   No current facility-administered medications for this visit.    Allergies:   Patient has no known allergies.   Social History:  The patient  reports that he quit smoking about 2 years ago. His smoking use included cigarettes. He has a 120.00 pack-year smoking history. He has never  used smokeless tobacco. He reports that he does not drink alcohol or use drugs.   Family History:  The patient's family history includes COPD in his brother; Diabetes in his mother; Heart disease in his brother.  ROS:  Please see the history of present illness.  All other systems are reviewed and otherwise negative.   PHYSICAL EXAM:  VS:  BP (!) 130/58   Pulse (!) 57   Ht 5\' 5"  (1.651 m)   Wt 247 lb (112 kg)   BMI 41.10 kg/m  BMI: Body mass index is 41.1 kg/m. Well nourished, well developed, in no acute distress  HEENT: normocephalic, atraumatic  Neck: no JVD, carotid bruits or masses Cardiac:  RRR; very soft SM, no rubs, or gallops Lungs:  CTA b/l, no wheezing, rhonchi or rales  Abd: soft, nontender, obese MS: no deformity or trophy Ext: trace edema b/l Skin: warm and dry, no rash Neuro:  No gross deficits appreciated Psych: euthymic mood, full affect   EKG:  Not done today  04/04/2019 SR , LAD, normal intervals (off flecainide and dilt) 04/02/2019 junctional rhythm, 40bpm, LAD, PVCs (on flecainide and dilt) 03/11/2019 AFib 95bpm    04/29/2018: TTE Study Conclusions  - Left ventricle: The cavity size was normal. Wall thickness was  normal. Systolic function was normal. The estimated ejection  fraction was in the range of 60% to 65%. Wall motion was normal;  there were no regional wall motion abnormalities. Doppler  parameters are consistent with abnormal left ventricular  relaxation (grade 1 diastolic dysfunction).     Jan 2017 Monitor Monitor shows NSVT up 16 beat runs and periods of ventricular escape rhythm in the 30's.  Either could explain syncope. Please refer to EP asap for evaluation and management. His nuclear stress test was negative for ischemia with normal LV function. Hesitant to use b-blocker due to bradycardia.    06/25/2015: stress myoview  The left ventricular ejection fraction is normal (55-65%).  Nuclear stress EF: 57%.  There was no  ST segment deviation noted during stress.  The study is normal.   Normal stress nuclear study with no ischemia or infarction; EF 57 with normal wall motion.   Recent Labs: No results found for requested labs within last 8760 hours.  No results found for requested labs within last 8760 hours.   CrCl cannot be calculated (Patient's most recent lab result is older than the maximum 21 days allowed.).   Wt Readings from Last 3 Encounters:  06/30/19 247 lb (112 kg)  04/02/19 249 lb (112.9 kg)  03/11/19 242 lb 12.8 oz (110.1 kg)     Other studies reviewed: Additional studies/records reviewed today include: summarized above  ASSESSMENT AND PLAN:  1. Paroxysmal AFib  CHA2DS2Vasc is 2, on xarelto, appropriately dosed by last labs     No symptoms, unclear if he would be aware of his AFib or not     Failed flecainide with marked bradycardia, development of junctional rhythm  2. NSVT     No recurrent syncope, no symptoms of any kind  3. Baseline conduction system disease     LBBB, LAFB     H/o bradycardia       4. HTN     Looks ok, no changes      5. Diastolic dysfunction     DOE with mod-heavy exertion is likely multifactorial     No rest symptoms, no symptoms of PND or orthopnea     Not new, perhaps a little worse with more sedentary lifestyle and weight gain this year     Echo just over a year ago with preserved LVEF, grade I DD, no significant VHD     He does not appear overtly volume OL, trace edema is described as chronic     We discussed starting to exercise with walking     We discussed watching his fluid intake and minimizing sodium intake   Disposition: BMET and cbc today given his meds, see him back in 57mo, sooner if needed  Current medicines are reviewed at length with the patient today.  The patient did not have any concerns regarding medicines.  Venetia Night, PA-C 06/30/2019 10:05 AM     [place] [address] [place] [address] [phone] (office)  [phone] (fax)

## 2019-06-30 ENCOUNTER — Telehealth: Payer: Self-pay | Admitting: Cardiology

## 2019-06-30 ENCOUNTER — Other Ambulatory Visit: Payer: Self-pay

## 2019-06-30 ENCOUNTER — Ambulatory Visit: Payer: Medicare Other | Admitting: Physician Assistant

## 2019-06-30 VITALS — BP 130/58 | HR 57 | Ht 65.0 in | Wt 247.0 lb

## 2019-06-30 DIAGNOSIS — R0600 Dyspnea, unspecified: Secondary | ICD-10-CM

## 2019-06-30 DIAGNOSIS — I5189 Other ill-defined heart diseases: Secondary | ICD-10-CM

## 2019-06-30 DIAGNOSIS — I10 Essential (primary) hypertension: Secondary | ICD-10-CM | POA: Diagnosis not present

## 2019-06-30 DIAGNOSIS — Z79899 Other long term (current) drug therapy: Secondary | ICD-10-CM

## 2019-06-30 DIAGNOSIS — I480 Paroxysmal atrial fibrillation: Secondary | ICD-10-CM | POA: Diagnosis not present

## 2019-06-30 DIAGNOSIS — I4729 Other ventricular tachycardia: Secondary | ICD-10-CM

## 2019-06-30 DIAGNOSIS — I472 Ventricular tachycardia: Secondary | ICD-10-CM

## 2019-06-30 DIAGNOSIS — R0609 Other forms of dyspnea: Secondary | ICD-10-CM

## 2019-06-30 LAB — BASIC METABOLIC PANEL
BUN/Creatinine Ratio: 12 (ref 10–24)
BUN: 12 mg/dL (ref 8–27)
CO2: 23 mmol/L (ref 20–29)
Calcium: 9.2 mg/dL (ref 8.6–10.2)
Chloride: 98 mmol/L (ref 96–106)
Creatinine, Ser: 1.04 mg/dL (ref 0.76–1.27)
GFR calc Af Amer: 81 mL/min/{1.73_m2} (ref >59)
GFR calc non Af Amer: 70 mL/min/{1.73_m2} (ref >59)
Glucose: 105 mg/dL — ABNORMAL HIGH (ref 65–99)
Potassium: 4.5 mmol/L (ref 3.5–5.2)
Sodium: 136 mmol/L (ref 134–144)

## 2019-06-30 LAB — CBC
Hematocrit: 36.3 % — ABNORMAL LOW (ref 37.5–51.0)
Hemoglobin: 12.2 g/dL — ABNORMAL LOW (ref 13.0–17.7)
MCH: 29 pg (ref 26.6–33.0)
MCHC: 33.6 g/dL (ref 31.5–35.7)
MCV: 86 fL (ref 79–97)
Platelets: 196 10*3/uL (ref 150–450)
RBC: 4.2 x10E6/uL (ref 4.14–5.80)
RDW: 13.8 % (ref 11.6–15.4)
WBC: 9.3 10*3/uL (ref 3.4–10.8)

## 2019-06-30 NOTE — Telephone Encounter (Signed)
New message:    Patient wife calling stating that she has to come with her husband for this appt because he can not receive information. Please call if any questions.

## 2019-06-30 NOTE — Patient Instructions (Signed)
Medication Instructions:   Your physician recommends that you continue on your current medications as directed. Please refer to the Current Medication list given to you today.  *If you need a refill on your cardiac medications before your next appointment, please call your pharmacy*  Lab Work:   BMET AND CBC TODAY   If you have labs (blood work) drawn today and your tests are completely normal, you will receive your results only by: Marland Kitchen MyChart Message (if you have MyChart) OR . A paper copy in the mail If you have any lab test that is abnormal or we need to change your treatment, we will call you to review the results.  Testing/Procedures:  Follow-Up: At [facility], you and your health needs are our priority.  As part of our continuing mission to provide you with exceptional heart care, we have created designated Provider Care Teams.  These Care Teams include your primary Cardiologist (physician) and Advanced Practice Providers (APPs -  Physician Assistants and Nurse Practitioners) who all work together to provide you with the care you need, when you need it.  Your next appointment:   6 month(s)  The format for your next appointment:   In Person  Provider:   You may see Will [name], MD or one of the following Advanced Practice Providers on your designated Care Team:    Chanetta Marshall, NP  Tommye Standard, PA-C  Legrand Como "Oda Kilts, [place]   Other Instructions

## 2019-07-14 DIAGNOSIS — G4733 Obstructive sleep apnea (adult) (pediatric): Secondary | ICD-10-CM | POA: Diagnosis not present

## 2019-07-19 DIAGNOSIS — G4733 Obstructive sleep apnea (adult) (pediatric): Secondary | ICD-10-CM | POA: Diagnosis not present

## 2019-08-11 DIAGNOSIS — G4733 Obstructive sleep apnea (adult) (pediatric): Secondary | ICD-10-CM | POA: Diagnosis not present

## 2019-09-11 DIAGNOSIS — G4733 Obstructive sleep apnea (adult) (pediatric): Secondary | ICD-10-CM | POA: Diagnosis not present

## 2019-09-18 ENCOUNTER — Other Ambulatory Visit: Payer: Self-pay | Admitting: Internal Medicine

## 2019-10-06 DIAGNOSIS — G4733 Obstructive sleep apnea (adult) (pediatric): Secondary | ICD-10-CM | POA: Diagnosis not present

## 2019-10-11 DIAGNOSIS — G4733 Obstructive sleep apnea (adult) (pediatric): Secondary | ICD-10-CM | POA: Diagnosis not present

## 2019-11-11 DIAGNOSIS — G4733 Obstructive sleep apnea (adult) (pediatric): Secondary | ICD-10-CM | POA: Diagnosis not present

## 2019-12-11 DIAGNOSIS — G4733 Obstructive sleep apnea (adult) (pediatric): Secondary | ICD-10-CM | POA: Diagnosis not present

## 2019-12-16 DIAGNOSIS — G4733 Obstructive sleep apnea (adult) (pediatric): Secondary | ICD-10-CM | POA: Diagnosis not present

## 2019-12-18 DIAGNOSIS — I10 Essential (primary) hypertension: Secondary | ICD-10-CM | POA: Diagnosis not present

## 2019-12-18 DIAGNOSIS — E782 Mixed hyperlipidemia: Secondary | ICD-10-CM | POA: Diagnosis not present

## 2019-12-23 DIAGNOSIS — I10 Essential (primary) hypertension: Secondary | ICD-10-CM | POA: Diagnosis not present

## 2019-12-23 DIAGNOSIS — F17210 Nicotine dependence, cigarettes, uncomplicated: Secondary | ICD-10-CM | POA: Diagnosis not present

## 2019-12-23 DIAGNOSIS — G4733 Obstructive sleep apnea (adult) (pediatric): Secondary | ICD-10-CM | POA: Diagnosis not present

## 2019-12-23 DIAGNOSIS — R350 Frequency of micturition: Secondary | ICD-10-CM | POA: Diagnosis not present

## 2019-12-23 DIAGNOSIS — Z0001 Encounter for general adult medical examination with abnormal findings: Secondary | ICD-10-CM | POA: Diagnosis not present

## 2020-01-11 DIAGNOSIS — G4733 Obstructive sleep apnea (adult) (pediatric): Secondary | ICD-10-CM | POA: Diagnosis not present

## 2020-01-27 ENCOUNTER — Other Ambulatory Visit: Payer: Self-pay

## 2020-01-27 ENCOUNTER — Encounter: Payer: Self-pay | Admitting: Cardiology

## 2020-01-27 ENCOUNTER — Ambulatory Visit: Payer: Medicare Other | Admitting: Cardiology

## 2020-01-27 VITALS — BP 142/68 | HR 74 | Ht 65.0 in | Wt 255.2 lb

## 2020-01-27 DIAGNOSIS — I4819 Other persistent atrial fibrillation: Secondary | ICD-10-CM | POA: Diagnosis not present

## 2020-01-27 NOTE — Progress Notes (Signed)
Electrophysiology Office Note   Date:  01/27/2020   ID:  Derrick Alexander, DOB [birth year], MRN [id number]  PCP:  Derrick Pretty, MD  Cardiologist:  Debara Pickett Primary Electrophysiologist:  Derrick Rarick [name], MD    No chief complaint on file.    History of Present Illness: Derrick Alexander is a 74 y.o. male who presents today for electrophysiology evaluation.  He has a history of hypertension, left bundle branch block, tobacco abuse with a 60-pack-year smoking history, and nonsustained VT.  He also has paroxysmal atrial fibrillation.  He had an episode of syncope and wore a cardiac monitor that showed no obvious causes for syncope but a 16 beat run of nonsustained VT.  He also had bradycardia into the 30s.  At his last visit, he was found to be in atrial fibrillation.  He was started on flecainide and had a cardioversion.  Unfortunately, he had significant junctional bradycardia and flecainide was stopped.  Today, denies symptoms of palpitations, chest pain, shortness of breath, orthopnea, PND, lower extremity edema, claudication, dizziness, presyncope, syncope, bleeding, or neurologic sequela. The patient is tolerating medications without difficulties.  Since last being seen he has done well.  He did have a cardioversion back to normal rhythm and was put on flecainide.  He came back to clinic in junctional rhythm, and flecainide was stopped.  Since that time he has done well.   Past Medical History:  Diagnosis Date   A-fib [facility])    Abnormal EKG 06/18/2015   COPD (chronic obstructive pulmonary disease) (HCC)    COPD with exacerbation ([place])    Faintness 06/18/2015   Hypertension    LBBB (left bundle branch block) 06/18/2015   Obesity    Syncope 08/06/2015   Tobacco use    V-tach ([place]) 08/06/2015   Past Surgical History:  Procedure Laterality Date   COLONOSCOPY WITH PROPOFOL N/A 03/07/2019   Procedure: COLONOSCOPY WITH PROPOFOL;  Surgeon: Carol Ada, MD;  Location: [facility];   Service: Endoscopy;  Laterality: N/A;   POLYPECTOMY  03/07/2019   Procedure: POLYPECTOMY;  Surgeon: Carol Ada, MD;  Location: [facility];  Service: Endoscopy;;     Current Outpatient Medications  Medication Sig Dispense Refill   Ascorbic Acid (VITAMIN C) 1000 MG tablet Take 1,000 mg by mouth daily.     Bioflavonoid Products (ESTER C PO) Take 1,000 mg by mouth daily.     famotidine (PEPCID) 20 MG tablet Take 20 mg by mouth at bedtime.      furosemide (LASIX) 40 MG tablet TAKE 1 AND 1/2 TABLETS(60 MG) BY MOUTH TWICE DAILY 270 tablet 2   irbesartan (AVAPRO) 300 MG tablet Take 1 tablet (300 mg total) by mouth daily. 90 tablet 3   rosuvastatin (CRESTOR) 20 MG tablet Take 20 mg by mouth daily.     sodium chloride (OCEAN) 0.65 % SOLN nasal spray Place 1 spray into both nostrils as needed for congestion.     spironolactone (ALDACTONE) 25 MG tablet Take 1 tablet (25 mg total) by mouth daily. 90 tablet 3   XARELTO 20 MG TABS tablet TAKE 1 TABLET BY MOUTH EVERY DAY WITH SUPPER 30 tablet 5   No current facility-administered medications for this visit.    Allergies:   Patient has no known allergies.   Social History:  The patient  reports that he quit smoking about 2 years ago. His smoking use included cigarettes. He has a 120.00 pack-year smoking history. He has never used smokeless tobacco. He reports that he does not  drink alcohol and does not use drugs.   Family History:  The patient's family history includes COPD in his brother; Diabetes in his mother; Heart disease in his brother.   ROS:  Please see the history of present illness.   Otherwise, review of systems is positive for none.   All other systems are reviewed and negative.   PHYSICAL EXAM: VS:  BP (!) 142/68    Pulse 74    Ht 5\' 5"  (1.651 m)    Wt 255 lb 3.2 oz (115.8 kg)    SpO2 95%    BMI 42.47 kg/m  , BMI Body mass index is 42.47 kg/m. GEN: Well nourished, well developed, in no acute distress  HEENT: normal  Neck:  no JVD, carotid bruits, or masses Cardiac: RRR; no murmurs, rubs, or gallops,no edema  Respiratory:  clear to auscultation bilaterally, normal work of breathing GI: soft, nontender, nondistended, + BS MS: no deformity or atrophy  Skin: warm and dry Neuro:  Strength and sensation are intact Psych: euthymic mood, full affect  EKG:  EKG is ordered today. Personal review of the ekg ordered shows sinus rhythm, rate 74  Recent Labs: 06/30/2019: BUN 12; Creatinine, Ser 1.04; Hemoglobin 12.2; Platelets 196; Potassium 4.5; Sodium 136    Lipid Panel  No results found for: CHOL, TRIG, HDL, CHOLHDL, VLDL, LDLCALC, LDLDIRECT   Wt Readings from Last 3 Encounters:  01/27/20 255 lb 3.2 oz (115.8 kg)  06/30/19 247 lb (112 kg)  04/02/19 249 lb (112.9 kg)      Other studies Reviewed: Additional studies/ records that were reviewed today include: Spect 06/25/15  Review of the above records today demonstrates:   The left ventricular ejection fraction is normal (55-65%).  Nuclear stress EF: 57%.  There was no ST segment deviation noted during stress.  The study is normal.  Normal stress nuclear study with no ischemia or infarction; EF 57 with normal wall motion.  Tele monitor 06/17/14 Monitor shows NSVT up 16 beat runs and periods of ventricular escape rhythm in the 30's.   TTE 04/29/18 - Left ventricle: The cavity size was normal. Wall thickness was   normal. Systolic function was normal. The estimated ejection   fraction was in the range of 60% to 65%. Wall motion was normal;   there were no regional wall motion abnormalities. Doppler   parameters are consistent with abnormal left ventricular   relaxation (grade 1 diastolic dysfunction).   ASSESSMENT AND PLAN:  1.  Nonsustained ventricular tachycardia: Currently on diltiazem and flecainide without further episodes.  2.  Bradycardia: No further episodes  3.  Syncope: No further episodes noted.  4. Paroxysmal atrial fibrillation:  Currently on Xarelto diltiazem.  CHA2DS2-VASc of 4.  He had previously been on flecainide, but had junctional rhythm and was stopped.  He is remained in sinus rhythm.  No changes.  5. Hypertension: Mildly elevated today but was normal on his last check.  He has follow-up coming up with a general cardiologist and if it is elevated, could potentially adjust medications at that time.  Current medicines are reviewed at length with the patient today.   The patient does not have concerns regarding his medicines.  The following changes were made today: None  Labs/ tests ordered today include:  Orders Placed This Encounter  Procedures   EKG 12-Lead     Disposition:   FU with Carmina Walle 6 months  Signed, Tearsa Kowalewski [name], MD  01/27/2020 2:58 PM     [facility]  [address] [place] [address] [phone] (office) [phone] (fax)

## 2020-01-27 NOTE — Patient Instructions (Signed)
Medication Instructions:  Your physician recommends that you continue on your current medications as directed. Please refer to the Current Medication list given to you today.  *If you need a refill on your cardiac medications before your next appointment, please call your pharmacy*   Lab Work: None ordered If you have labs (blood work) drawn today and your tests are completely normal, you will receive your results only by: . MyChart Message (if you have MyChart) OR . A paper copy in the mail If you have any lab test that is abnormal or we need to change your treatment, we will call you to review the results.   Testing/Procedures: None ordered   Follow-Up: At [facility], you and your health needs are our priority.  As part of our continuing mission to provide you with exceptional heart care, we have created designated Provider Care Teams.  These Care Teams include your primary Cardiologist (physician) and Advanced Practice Providers (APPs -  Physician Assistants and Nurse Practitioners) who all work together to provide you with the care you need, when you need it.  We recommend signing up for the patient portal called "MyChart".  Sign up information is provided on this After Visit Summary.  MyChart is used to connect with patients for Virtual Visits (Telemedicine).  Patients are able to view lab/test results, encounter notes, upcoming appointments, etc.  Non-urgent messages can be sent to your provider as well.   To learn more about what you can do with MyChart, go to [email or web address].    Your next appointment:   6 month(s)  The format for your next appointment:   In Person  Provider:   Will Camnitz, MD   Thank you for choosing [facility]!!   Aribella Vavra, RN [phone]    Other Instructions    

## 2020-02-03 ENCOUNTER — Encounter: Payer: Self-pay | Admitting: Internal Medicine

## 2020-02-03 ENCOUNTER — Other Ambulatory Visit: Payer: Self-pay

## 2020-02-03 ENCOUNTER — Ambulatory Visit: Payer: Medicare Other | Admitting: Internal Medicine

## 2020-02-03 VITALS — BP 128/70 | HR 63 | Ht 65.0 in | Wt 256.6 lb

## 2020-02-03 DIAGNOSIS — I10 Essential (primary) hypertension: Secondary | ICD-10-CM | POA: Diagnosis not present

## 2020-02-03 DIAGNOSIS — I480 Paroxysmal atrial fibrillation: Secondary | ICD-10-CM

## 2020-02-03 DIAGNOSIS — I472 Ventricular tachycardia: Secondary | ICD-10-CM

## 2020-02-03 DIAGNOSIS — I4729 Other ventricular tachycardia: Secondary | ICD-10-CM

## 2020-02-03 NOTE — Patient Instructions (Signed)

## 2020-02-03 NOTE — Progress Notes (Addendum)
OFFICE NOTE  Chief Complaint:  Follow-up  Primary Care Physician: [name], MD  HPI:  Derrick Alexander is a pleasant 74-year-old male with no significant past medical history. He only takes aspirin occasionally as needed. Unfortunately he's a 60 year smoker that currently smoking. Recently he's had 3 different episodes of what seemed to be brief syncope. All of the episodes were at rest and he was seated at the time. His wife said that he was talking with her and then briefly seem to lose consciousness but then was easily awakened and was not confused. He did not slump over lose control of bowel or bladder function. There was no seizure-like activity. He reported some mild prodrome to the symptoms. In fact he felt some tingling and numbness around his scalp that felt like he was wearing a tight baseball cap as well as some graying of his vision. He underwent carotid Dopplers 2 days ago and those results are pending. Of note his blood pressure was mildly elevated today but he has no history of hypertension in fact he was thought to be possibly hypotensive or orthostatic. He denies any chest pain or worsening shortness of breath. His EKG however shows left bundle branch pattern as well as ST and T wave abnormalities laterally with 1 mm of horizontal ST depression at rest concerning for ischemia.  Derrick Alexander returns to follow-up on his monitor. Monitor shows NSVT up 16 beat runs and periods of ventricular escape rhythm in the 30's. Either could explain syncope. His nuclear stress test was negative for ischemia with normal LV function. Suspect he will likely need AICD/PPM. I contacted Mr. Omary to recommend an urgent referral but he wished to discuss it further with me in the office first. He understands that these arrhythmias could be life-threatening.  07/23/2018  Derrick Alexander is seen today in follow-up.  I am not seen him in the last couple years when he was initially having ventricular tachycardia  and I referred him to Dr. Curt Bears.  He has been managed there and fortunately is had no other significant VT.  He does have a EF and has been appropriately anticoagulated and managed for that.  He is maintaining sinus rhythm.  He has been referred back to me for general cardiology needs.  He does have morbid obesity, recently having significant weight gain after he stopped smoking about a year ago.  He remains on the nicotine patch.  EKG is stable showing bifascicular block but normal sinus rhythm.  Blood pressure is at goal today.  His cholesterol is elevated based on numbers in June 2019.  His total cholesterol is 219, HDL 43, LDL 135 and triglycerides 205.  Hemoglobin A1c of 6.0.  Additionally, he does have a history of snoring and witnessed apnea.  This is persisted and he has significant daytime fatigue and somnolence.  His EPWSS score is 17, highly suggestive of sleep apnea.  He did seem interested in a sleep study.  02/03/2019  Derrick Alexander is seen today in follow-up.  Overall he seems to be doing well.  He denies any recurrent arrhythmias.  EKG today shows sinus rhythm at 64.  He denies any chest pain.  I am not aware that he underwent his sleep study.  He apparently was set up for home sleep study.  There were a couple appointments that were canceled and he says his wife has more knowledge about what actually happened but he did recall having had a home sleep test.  Perhaps this was arranged through his primary care provider.  02/03/2020  Derrick Alexander is seen today for annual follow-up. Which is seen by Dr. Curt Bears a week ago. He declines any further NSVT. He was noted to be on diltiazem and flecainide however his wife reports he does not take diltiazem. This was discontinued due to bradycardia. Basically he is only on Xarelto. I think he was also taken off of flecainide due to significant interactions. Is not clear if Dr. Curt Bears is aware that he was not on either of these medications. I will mention this  to him.  PMHx: Past Medical History:  Diagnosis Date  . A-fib ([place])   . Abnormal EKG 06/18/2015  . COPD (chronic obstructive pulmonary disease) ([place])   . COPD with exacerbation ([place])   . Faintness 06/18/2015  . Hypertension   . LBBB (left bundle branch block) 06/18/2015  . Obesity   . Syncope 08/06/2015  . Tobacco use   . V-tach [facility]) 08/06/2015    Past Surgical History:  Procedure Laterality Date  . COLONOSCOPY WITH PROPOFOL N/A 03/07/2019   Procedure: COLONOSCOPY WITH PROPOFOL;  Surgeon: Carol Ada, MD;  [facility];  Service: Endoscopy;  Laterality: N/A;  . POLYPECTOMY  03/07/2019   Procedure: POLYPECTOMY;  Surgeon: Carol Ada, MD;  [facility];  Service: Endoscopy;;    FAMHx:  Family History  Problem Relation Age of Onset  . Heart disease Brother   . COPD Brother   . Diabetes Mother     SOCHx:   reports that he quit smoking about 2 years ago. His smoking use included cigarettes. He has a 120.00 pack-year smoking history. He has never used smokeless tobacco. He reports that he does not drink alcohol and does not use drugs.  ALLERGIES:  No Known Allergies  ROS: Pertinent items noted in HPI and remainder of comprehensive ROS otherwise negative.  HOME MEDS: Current Outpatient Medications  Medication Sig Dispense Refill  . Ascorbic Acid (VITAMIN C) 1000 MG tablet Take 1,000 mg by mouth daily.    Marland Kitchen Bioflavonoid Products (ESTER C PO) Take 1,000 mg by mouth daily.    . famotidine (PEPCID) 20 MG tablet Take 20 mg by mouth at bedtime.     . furosemide (LASIX) 40 MG tablet TAKE 1 AND 1/2 TABLETS(60 MG) BY MOUTH TWICE DAILY 270 tablet 2  . irbesartan (AVAPRO) 300 MG tablet Take 1 tablet (300 mg total) by mouth daily. 90 tablet 3  . rosuvastatin (CRESTOR) 20 MG tablet Take 20 mg by mouth daily.    . sodium chloride (OCEAN) 0.65 % SOLN nasal spray Place 1 spray into both nostrils as needed for congestion.    Marland Kitchen spironolactone (ALDACTONE) 25 MG tablet  Take 1 tablet (25 mg total) by mouth daily. 90 tablet 3  . XARELTO 20 MG TABS tablet TAKE 1 TABLET BY MOUTH EVERY DAY WITH SUPPER 30 tablet 5   No current facility-administered medications for this visit.    LABS/IMAGING: No results found for this or any previous visit (from the past 48 hour(s)). No results found.  WEIGHTS: Wt Readings from Last 3 Encounters:  02/03/20 256 lb 9.6 oz (116.4 kg)  01/27/20 255 lb 3.2 oz (115.8 kg)  06/30/19 247 lb (112 kg)    VITALS: BP 128/70   Pulse 63   Ht 5\' 5"  (1.651 m)   Wt 256 lb 9.6 oz (116.4 kg)   SpO2 93%   BMI 42.70 kg/m   EXAM: General appearance: alert, no  distress and morbidly obese Neck: no carotid bruit, no JVD and thyroid not enlarged, symmetric, no tenderness/mass/nodules Lungs: clear to auscultation bilaterally Heart: regular rate and rhythm, S1, S2 normal and systolic murmur: early systolic 2/6, crescendo at 2nd right intercostal space Abdomen: soft, non-tender; bowel sounds normal; no masses,  no organomegaly Extremities: extremities normal, atraumatic, no cyanosis or edema Pulses: 2+ and symmetric Skin: Skin color, texture, turgor normal. No rashes or lesions Neurologic: Grossly normal Psych: Pleasant  EKG: Sinus rhythm with PACs, nonspecific ST and T wave changes-personally reviewed  ASSESSMENT: 1. NSVT 2. PAF - CHADSVASC score of 4 3. Bradycardia - ventricular escape in the 30's 4. Syncope x 3 5. HTN 6. Abnormal EKG with left bundle branch / LAFB pattern 7. Tobacco abuse 8. Snoring, witnessed apnea -EPWSS of 17  PLAN: 1.   Mr. Weckerly is currently off of diltiazem and flecainide although this was apparently thought to be medicines that were continued in September. I am concerned about breakthrough A. fib although he had issues with bradycardia on the diltiazem and felt unwell on flecainide. Despite this he is not clear when he goes into A. fib and I think a pocket pill strategy with flecainide as he says was  proposed would not be effective for him. I will reach out to Dr. Curt Bears to see if there are other options for him or whether he needs perhaps a longer term monitoring such as a loop recorder since he has had syncope, bradycardia and ventricular arrhythmias as well as afib. Blood pressure is well-controlled today.   Pixie Casino, MD, [facility], [place] Director of the Advanced Lipid Disorders &  Cardiovascular Risk Reduction Clinic Diplomate of the American Board of Clinical Lipidology Attending Cardiologist  Direct Dial: [phone]  Fax: [phone]  Website:  [email or web address] 02/03/2020, 8:54 AM

## 2020-03-08 ENCOUNTER — Ambulatory Visit: Payer: Medicare Other | Admitting: Cardiology

## 2020-03-10 ENCOUNTER — Other Ambulatory Visit: Payer: Self-pay | Admitting: Internal Medicine

## 2020-03-10 ENCOUNTER — Other Ambulatory Visit: Payer: Self-pay | Admitting: Physician Assistant

## 2020-03-15 DIAGNOSIS — G4733 Obstructive sleep apnea (adult) (pediatric): Secondary | ICD-10-CM | POA: Diagnosis not present

## 2020-04-09 DIAGNOSIS — H5203 Hypermetropia, bilateral: Secondary | ICD-10-CM | POA: Diagnosis not present

## 2020-04-09 DIAGNOSIS — H40013 Open angle with borderline findings, low risk, bilateral: Secondary | ICD-10-CM | POA: Diagnosis not present

## 2020-04-09 DIAGNOSIS — H2513 Age-related nuclear cataract, bilateral: Secondary | ICD-10-CM | POA: Diagnosis not present

## 2020-04-22 DIAGNOSIS — G4733 Obstructive sleep apnea (adult) (pediatric): Secondary | ICD-10-CM | POA: Diagnosis not present

## 2020-05-09 ENCOUNTER — Other Ambulatory Visit: Payer: Self-pay | Admitting: Physician Assistant

## 2020-06-23 DIAGNOSIS — G4733 Obstructive sleep apnea (adult) (pediatric): Secondary | ICD-10-CM | POA: Diagnosis not present

## 2020-07-08 ENCOUNTER — Other Ambulatory Visit: Payer: Self-pay | Admitting: Internal Medicine

## 2020-07-09 DIAGNOSIS — H401131 Primary open-angle glaucoma, bilateral, mild stage: Secondary | ICD-10-CM | POA: Diagnosis not present

## 2020-07-30 ENCOUNTER — Other Ambulatory Visit: Payer: Self-pay

## 2020-07-30 ENCOUNTER — Ambulatory Visit: Payer: Medicare Other | Admitting: Cardiology

## 2020-07-30 ENCOUNTER — Encounter: Payer: Self-pay | Admitting: Cardiology

## 2020-07-30 VITALS — BP 110/60 | HR 90 | Ht 65.0 in | Wt 259.4 lb

## 2020-07-30 DIAGNOSIS — I4819 Other persistent atrial fibrillation: Secondary | ICD-10-CM

## 2020-07-30 DIAGNOSIS — Z79899 Other long term (current) drug therapy: Secondary | ICD-10-CM | POA: Diagnosis not present

## 2020-07-30 LAB — TSH: TSH: 0.569 u[IU]/mL (ref 0.450–4.500)

## 2020-07-30 LAB — HEPATIC FUNCTION PANEL
ALT: 18 IU/L (ref 0–44)
AST: 13 IU/L (ref 0–40)
Albumin: 4.3 g/dL (ref 3.7–4.7)
Alkaline Phosphatase: 77 IU/L (ref 44–121)
Bilirubin Total: 0.3 mg/dL (ref 0.0–1.2)
Bilirubin, Direct: 0.11 mg/dL (ref 0.00–0.40)
Total Protein: 6.6 g/dL (ref 6.0–8.5)

## 2020-07-30 MED ORDER — AMIODARONE HCL 200 MG PO TABS: ORAL_TABLET | ORAL | 0 refills | Status: DC

## 2020-07-30 MED ORDER — AMIODARONE HCL 200 MG PO TABS: 200.0000 mg | ORAL_TABLET | Freq: Every day | ORAL | 1 refills | Status: DC

## 2020-07-30 NOTE — Progress Notes (Signed)
Electrophysiology Office Note   Date:  07/30/2020   ID:  Derrick Alexander, DOB 11-14-1945, MRN [id number]  PCP:  [name], MD  Cardiologist:  Debara Pickett Primary Electrophysiologist:  Will [name], MD    No chief complaint on file.    History of Present Illness: Derrick Alexander is a 75 y.o. male who presents today for electrophysiology evaluation.    He has a history significant for hypertension, left bundle branch block, tobacco abuse with a 60-pack-year history, and nonsustained VT.  He also has paroxysmal atrial fibrillation.  He wore a cardiac monitor that showed no obvious cause for syncope, but a 16 beat run of nonsustained VT.  He also had bradycardia in the 30s.  He was initially put on flecainide for his atrial fibrillation, was found to be bradycardic and flecainide was stopped.  Today, denies symptoms of palpitations, chest pain,  orthopnea, PND, lower extremity edema, claudication, dizziness, presyncope, syncope, bleeding, or neurologic sequela. The patient is tolerating medications without difficulties.  Today he presents with weakness, fatigue, and dyspnea on exertion.  He has no chest pain.  He has difficulty walking to the mailbox.  He was initially doing quite a bit of walking with a friend, though the friend got sick.  He is walking twice a week.  This has stopped.  ECG today shows atrial fibrillation.   Past Medical History:  Diagnosis Date  . A-fib ([place])   . Abnormal EKG 06/18/2015  . COPD (chronic obstructive pulmonary disease) ([place])   . COPD with exacerbation ([place])   . Faintness 06/18/2015  . Hypertension   . LBBB (left bundle branch block) 06/18/2015  . Obesity   . Syncope 08/06/2015  . Tobacco use   . V-tach [facility]) 08/06/2015   Past Surgical History:  Procedure Laterality Date  . COLONOSCOPY WITH PROPOFOL N/A 03/07/2019   Procedure: COLONOSCOPY WITH PROPOFOL;  Surgeon: Carol Ada, MD;  Location: [facility] END[facility]COPY;  Service: Endoscopy;  Laterality: N/A;  .  POLYPECTOMY  03/07/2019   Procedure: POLYPECTOMY;  Surgeon: Carol Ada, MD;  Location: [facility];  Service: Endoscopy;;     Current Outpatient Medications  Medication Sig Dispense Refill  . amiodarone (PACERONE) 200 MG tablet Take 2 tablets (400 mg total) TWICE a day for 2 weeks, then reduce and take 1 tablet (200 mg total) TWICE a day for 2 weeks, then take 1 tablet ONCE daily 84 tablet 0  . amiodarone (PACERONE) 200 MG tablet Take 1 tablet (200 mg total) by mouth daily. 90 tablet 1  . Ascorbic Acid (VITAMIN C) 1000 MG tablet Take 1,000 mg by mouth daily.    . famotidine (PEPCID) 20 MG tablet Take 20 mg by mouth at bedtime.     . furosemide (LASIX) 40 MG tablet TAKE 1 AND 1/2 TABLETS(60 MG) BY MOUTH TWICE DAILY 270 tablet 3  . irbesartan (AVAPRO) 300 MG tablet TAKE 1 TABLET(300 MG) BY MOUTH DAILY 90 tablet 3  . rivaroxaban (XARELTO) 20 MG TABS tablet Take 1 tablet (20 mg total) by mouth daily with supper. LABS NEEDED FOR FURTHER REFILLS 90 tablet 0  . rosuvastatin (CRESTOR) 20 MG tablet Take 20 mg by mouth daily.    . sodium chloride (OCEAN) 0.65 % SOLN nasal spray Place 1 spray into both nostrils as needed for congestion.    Marland Kitchen spironolactone (ALDACTONE) 25 MG tablet Take 1 tablet (25 mg total) by mouth daily. 90 tablet 3   No current facility-administered medications for this visit.    Allergies:  Patient has no known allergies.   Social History:  The patient  reports that he quit smoking about 3 years ago. His smoking use included cigarettes. He has a 120.00 pack-year smoking history. He has never used smokeless tobacco. He reports that he does not drink alcohol and does not use drugs.   Family History:  The patient's family history includes COPD in his brother; Diabetes in his mother; Heart disease in his brother.   ROS:  Please see the history of present illness.   Otherwise, review of systems is positive for none.   All other systems are reviewed and negative.   PHYSICAL  EXAM: VS:  BP 110/60   Pulse 90   Ht 5\' 5"  (1.651 m)   Wt 259 lb 6.4 oz (117.7 kg)   SpO2 98%   BMI 43.17 kg/m  , BMI Body mass index is 43.17 kg/m. GEN: Well nourished, well developed, in no acute distress  HEENT: normal  Neck: no JVD, carotid bruits, or masses Cardiac: Irregular RRR; no murmurs, rubs, or gallops,no edema  Respiratory:  clear to auscultation bilaterally, normal work of breathing GI: soft, nontender, nondistended, + BS MS: no deformity or atrophy  Skin: warm and dry Neuro:  Strength and sensation are intact Psych: euthymic mood, full affect  EKG:  EKG is ordered today. Personal review of the ekg ordered shows atrial fibrillation  Recent Labs: No results found for requested labs within last 8760 hours.    Lipid Panel  No results found for: CHOL, TRIG, HDL, CHOLHDL, VLDL, LDLCALC, LDLDIRECT   Wt Readings from Last 3 Encounters:  07/30/20 259 lb 6.4 oz (117.7 kg)  02/03/20 256 lb 9.6 oz (116.4 kg)  01/27/20 255 lb 3.2 oz (115.8 kg)      Other studies Reviewed: Additional studies/ records that were reviewed today include: Spect 06/25/15  Review of the above records today demonstrates:   The left ventricular ejection fraction is normal (55-65%).  Nuclear stress EF: 57%.  There was no ST segment deviation noted during stress.  The study is normal.  Normal stress nuclear study with no ischemia or infarction; EF 57 with normal wall motion.  Tele monitor 06/17/14 Monitor shows NSVT up 16 beat runs and periods of ventricular escape rhythm in the 30's.   TTE 04/29/18 - Left ventricle: The cavity size was normal. Wall thickness was   normal. Systolic function was normal. The estimated ejection   fraction was in the range of 60% to 65%. Wall motion was normal;   there were no regional wall motion abnormalities. Doppler   parameters are consistent with abnormal left ventricular   relaxation (grade 1 diastolic dysfunction).   ASSESSMENT AND PLAN:  1.   Nonsustained ventricular tachycardia: Currently on flecainide.  No further obvious episodes.  2.  Bradycardia: No further episodes.  3.  Syncope: No further episodes noted  4.  Persistent atrial fibrillation: Currently on Xarelto and diltiazem. CHA2DS2-VASc of 4.  Unfortunately he is back in atrial fibrillation and is feeling quite poorly.  He has fatigue and shortness of breath.  I do think that rhythm control would be beneficial.  He has sleep apnea and is compliant with his CPAP.  I have offered him ablation versus amiodarone versus Tikosyn.  At this point, he would prefer amiodarone.  We will start that today get an ECG in 3 weeks.  If he remains in atrial fibrillation, will plan for cardioversion.  5.  Hypertension: Currently well controlled  Current medicines are  reviewed at length with the patient today.   The patient does not have concerns regarding his medicines.  The following changes were made today: None  Labs/ tests ordered today include:  Orders Placed This Encounter  Procedures  . TSH  . Hepatic function panel  . EKG 12-Lead     Disposition:   FU with Will Camnitz 3 months  Signed, Will [name], MD  07/30/2020 10:12 AM     [facility] [address] [place] [address] [phone] (office) [phone] (fax)

## 2020-07-30 NOTE — Patient Instructions (Addendum)
Medication Instructions:  Your physician has recommended you make the following change in your medication:  START Amiodarone  - take 2 tablets (400 mg total) TWICE a day for 2 weeks, then  - take 1 tablet (200 mg total) TWICE a day for 2 weeks, then  - take 1 tablet (200 mg total) ONCE a day  *If you need a refill on your cardiac medications before your next appointment, please call your pharmacy*   Lab Work: Today: TSH & LFTs If you have labs (blood work) drawn today and your tests are completely normal, you will receive your results only by: Marland Kitchen MyChart Message (if you have MyChart) OR . A paper copy in the mail If you have any lab test that is abnormal or we need to change your treatment, we will call you to review the results.   Testing/Procedures: None ordered   Follow-Up: At [facility], you and your health needs are our priority.  As part of our continuing mission to provide you with exceptional heart care, we have created designated Provider Care Teams.  These Care Teams include your primary Cardiologist (physician) and Advanced Practice Providers (APPs -  Physician Assistants and Nurse Practitioners) who all work together to provide you with the care you need, when you need it.  We recommend signing up for the patient portal called "MyChart".  Sign up information is provided on this After Visit Summary.  MyChart is used to connect with patients for Virtual Visits (Telemedicine).  Patients are able to view lab/test results, encounter notes, upcoming appointments, etc.  Non-urgent messages can be sent to your provider as well.   To learn more about what you can do with MyChart, go to [email or web address].    Your next appointment:   3 week(s)  The format for your next appointment:   In Person  Provider:   nurse visit EKG   Your physician recommends that you schedule a follow-up appointment in: 3 months with Dr. Curt Bears.     Thank you for choosing [facility]!!   Trinidad Curet, RN [phone]   Other Instructions  Amiodarone tablets What is this medicine? AMIODARONE (a MEE oh da rone) is an antiarrhythmic drug. It helps make your heart beat regularly. Because of the side effects caused by this medicine, it is only used when other medicines have not worked. It is usually used for heartbeat problems that may be life threatening. This medicine may be used for other purposes; ask your health care provider or pharmacist if you have questions. COMMON BRAND NAME(S): Cordarone, Pacerone What should I tell my health care provider before I take this medicine? They need to know if you have any of these conditions:  liver disease  lung disease  other heart problems  thyroid disease  an unusual or allergic reaction to amiodarone, iodine, other medicines, foods, dyes, or preservatives  pregnant or trying to get pregnant  breast-feeding How should I use this medicine? Take this medicine by mouth with a glass of water. Follow the directions on the prescription label. You can take this medicine with or without food. However, you should always take it the same way each time. Take your doses at regular intervals. Do not take your medicine more often than directed. Do not stop taking except on the advice of your doctor or health care professional. A special MedGuide will be given to you by the pharmacist with each prescription and refill. Be sure to read this information carefully each time.  Talk to your pediatrician regarding the use of this medicine in children. Special care may be needed. Overdosage: If you think you have taken too much of this medicine contact a poison control center or emergency room at once. NOTE: This medicine is only for you. Do not share this medicine with others. What if I miss a dose? If you miss a dose, take it as soon as you can. If it is almost time for your next dose, take only that dose. Do not take double or  extra doses. What may interact with this medicine? Do not take this medicine with any of the following medications:  abarelix  apomorphine  arsenic trioxide  certain antibiotics like erythromycin, gemifloxacin, levofloxacin, pentamidine  certain medicines for depression like amoxapine, tricyclic antidepressants  certain medicines for fungal infections like fluconazole, itraconazole, ketoconazole, posaconazole, voriconazole  certain medicines for irregular heart beat like disopyramide, dronedarone, ibutilide, propafenone, sotalol  certain medicines for malaria like chloroquine, halofantrine  cisapride  droperidol  haloperidol  [place]  maprotiline  methadone  phenothiazines like chlorpromazine, mesoridazine, thioridazine  pimozide  ranolazine  red yeast rice  vardenafil This medicine may also interact with the following medications:  antiviral medicines for HIV or AIDS  certain medicines for blood pressure, heart disease, irregular heart beat  certain medicines for cholesterol like atorvastatin, cerivastatin, lovastatin, simvastatin  certain medicines for hepatitis C like sofosbuvir and ledipasvir; sofosbuvir  certain medicines for seizures like phenytoin  certain medicines for thyroid problems  certain medicines that treat or prevent blood clots like warfarin  cholestyramine  cimetidine  clopidogrel  cyclosporine  dextromethorphan  diuretics  dofetilide  fentanyl  general anesthetics  grapefruit juice  lidocaine  loratadine  methotrexate  other medicines that prolong the QT interval (cause an abnormal heart rhythm)  procainamide  quinidine  rifabutin, rifampin, or rifapentine  St. John's Wort  trazodone  ziprasidone This list may not describe all possible interactions. Give your health care provider a list of all the medicines, herbs, non-prescription drugs, or dietary supplements you use. Also tell them if you smoke,  drink alcohol, or use illegal drugs. Some items may interact with your medicine. What should I watch for while using this medicine? Your condition will be monitored closely when you first begin therapy. Often, this drug is first started in a hospital or other monitored health care setting. Once you are on maintenance therapy, visit your doctor or health care professional for regular checks on your progress. Because your condition and use of this medicine carry some risk, it is a good idea to carry an identification card, necklace or bracelet with details of your condition, medications, and doctor or health care professional. Dennis Bast may get drowsy or dizzy. Do not drive, use machinery, or do anything that needs mental alertness until you know how this medicine affects you. Do not stand or sit up quickly, especially if you are an older patient. This reduces the risk of dizzy or fainting spells. This medicine can make you more sensitive to the sun. Keep out of the sun. If you cannot avoid being in the sun, wear protective clothing and use sunscreen. Do not use sun lamps or tanning beds/booths. You should have regular eye exams before and during treatment. Call your doctor if you have blurred vision, see halos, or your eyes become sensitive to light. Your eyes may get dry. It may be helpful to use a lubricating eye solution or artificial tears solution. If you are going to have surgery  or a procedure that requires contrast dyes, tell your doctor or health care professional that you are taking this medicine. What side effects may I notice from receiving this medicine? Side effects that you should report to your doctor or health care professional as soon as possible:  allergic reactions like skin rash, itching or hives, swelling of the face, lips, or tongue  blue-gray coloring of the skin  blurred vision, seeing blue green halos, increased sensitivity of the eyes to light  breathing problems  chest  pain  dark urine  fast, irregular heartbeat  feeling faint or light-headed  intolerance to heat or cold  nausea or vomiting  pain and swelling of the scrotum  pain, tingling, numbness in feet, hands  redness, blistering, peeling or loosening of the skin, including inside the mouth  spitting up blood  stomach pain  sweating  unusual or uncontrolled movements of body  unusually weak or tired  weight gain or loss  yellowing of the eyes or skin Side effects that usually do not require medical attention (report to your doctor or health care professional if they continue or are bothersome):  change in sex drive or performance  constipation  dizziness  headache  loss of appetite  trouble sleeping This list may not describe all possible side effects. Call your doctor for medical advice about side effects. You may report side effects to FDA at 1-800-FDA-1088. Where should I keep my medicine? Keep out of the reach of children. Store at room temperature between 20 and 25 degrees C (68 and 77 degrees F). Protect from light. Keep container tightly closed. Throw away any unused medicine after the expiration date. NOTE: This sheet is a summary. It may not cover all possible information. If you have questions about this medicine, talk to your doctor, pharmacist, or health care provider.  2021 Elsevier/Gold Standard (2018-04-10 13:44:04)

## 2020-08-05 ENCOUNTER — Telehealth: Payer: Self-pay | Admitting: Cardiology

## 2020-08-05 DIAGNOSIS — I4891 Unspecified atrial fibrillation: Secondary | ICD-10-CM

## 2020-08-05 NOTE — Telephone Encounter (Signed)
Spoke with pt and pt's wife, DPR who reports pt started Amiodarone as prescribed on 07/31/2020.  Pt began having increasing fatigue since starting medication and woke during the night with severe nausea, diaphoresis, lightheadedness, stomach pain and SOB.  Pt is lying in the bed resting.  He has not been able to eat or drink and has not taken morning medications.  Denies current CP or vomiting.  Pt advised to hold Amiodarone and will forward information to Dr Curt Bears PA, Oda Kilts, PA-C and Pharm-D team for review and further recommendation.

## 2020-08-05 NOTE — Telephone Encounter (Signed)
Agree with holding amiodarone.    There is a stomach bug going around but if his wife was caring for him and did not become ill would be less likely.   Would refer to AF clinic in 1-2 weeks to consider tikosyn. Suspect he would only need a short wash out period since he started amio so recently.  If his wife or anyone else around him has similar symptoms COULD consider stomach bug and re-challenging amio, but as above  less likely.     Lollie Marrow, PA-C

## 2020-08-05 NOTE — Telephone Encounter (Signed)
Pt c/o medication issue:  1. Name of Medication: amiodarone (PACERONE) 200 MG tablet  2. How are you currently taking this medication (dosage and times per day)? 2 tablet twice a day for 2 weeks, then 1 tablet twice a day for 2 weeks, then 1 tablet daily  3. Are you having a reaction (difficulty breathing--STAT)? No  4. What is your medication issue? Patient's wife states he started the medication Saturday. She states he has started having several side effects. She states he gets SOB with his afib, but now is very SOB when he moves. She states he is now laying in bed and is okay, but when he moves he feels like he ran a mile. She states he also has been feeling very dizzy when sitting or standing. She states he is having overwhelming nausea, but has not vomited. She states he wishes he would vomit to get rid of the nausea. She states he also has a pain in his stomach like it is coming from inside. She states he also has headaches and sweating. She states he cannot lay on his stomach. She states he started not feeling well a day or 2 ago. She states he is not having any chest pain. She states he seems to be fine now, because he is laying still in bed so he is not having SOB now.

## 2020-08-05 NOTE — Telephone Encounter (Signed)
Spoke with pt's wife, DPR who reports pt's nausea and stomach pain somewhat improved at this time. Pt remains, dizzy with fatigue and some SOB. Advised per pharmacy team to continue to hold Amiodarone for now and will wait for PA to weigh in.  Pt will take other medications as he has been able to drink water and cola.  Pt's wife reports BP is WNL and HR 67.  Reviewed ED precautions.  Pt's wife verbalizes understanding and agrees with current plan.

## 2020-08-05 NOTE — Telephone Encounter (Signed)
Nausea could be from amiodarone, although cannot rule out GI bug. Agree with hold amiodarone for now. SOB/fatigue most likely from afib and will have to defer to MD/PA for this.

## 2020-08-06 NOTE — Telephone Encounter (Signed)
Spoke with pt and his wife and advised per Oda Kilts, PA-C continue to hold Amiodarone.  Will refer to Afib clinic for consideration of Tikosyn.  Pt reports nausea and stomach pain is somewhat improved today.  Pt remains fatigued with some SOB.  Reviewed ED precautions.  Pt verbalizes understanding and agrees with current plan.  Referral to Afib clinic placed.

## 2020-08-06 NOTE — Addendum Note (Signed)
Addended by: Thora Lance on: 08/06/2020 08:33 AM   Modules accepted: Orders

## 2020-08-11 ENCOUNTER — Other Ambulatory Visit: Payer: Self-pay

## 2020-08-11 ENCOUNTER — Ambulatory Visit (HOSPITAL_COMMUNITY)
Admission: RE | Admit: 2020-08-11 | Discharge: 2020-08-11 | Disposition: A | Discharge status: Home / Self Care | Payer: Medicare Other | Source: Ambulatory Visit | Attending: Physician Assistant | Admitting: Physician Assistant

## 2020-08-11 ENCOUNTER — Encounter (HOSPITAL_COMMUNITY): Payer: Self-pay | Admitting: Physician Assistant

## 2020-08-11 VITALS — BP 114/58 | HR 61 | Ht 65.0 in | Wt 258.2 lb

## 2020-08-11 DIAGNOSIS — Z87891 Personal history of nicotine dependence: Secondary | ICD-10-CM | POA: Insufficient documentation

## 2020-08-11 DIAGNOSIS — Z7901 Long term (current) use of anticoagulants: Secondary | ICD-10-CM | POA: Insufficient documentation

## 2020-08-11 DIAGNOSIS — G4733 Obstructive sleep apnea (adult) (pediatric): Secondary | ICD-10-CM | POA: Insufficient documentation

## 2020-08-11 DIAGNOSIS — Z8249 Family history of ischemic heart disease and other diseases of the circulatory system: Secondary | ICD-10-CM | POA: Diagnosis not present

## 2020-08-11 DIAGNOSIS — E669 Obesity, unspecified: Secondary | ICD-10-CM | POA: Insufficient documentation

## 2020-08-11 DIAGNOSIS — I10 Essential (primary) hypertension: Secondary | ICD-10-CM | POA: Insufficient documentation

## 2020-08-11 DIAGNOSIS — I480 Paroxysmal atrial fibrillation: Secondary | ICD-10-CM | POA: Diagnosis not present

## 2020-08-11 DIAGNOSIS — I4510 Unspecified right bundle-branch block: Secondary | ICD-10-CM | POA: Insufficient documentation

## 2020-08-11 DIAGNOSIS — D6869 Other thrombophilia: Secondary | ICD-10-CM

## 2020-08-11 DIAGNOSIS — Z6841 Body Mass Index (BMI) 40.0 and over, adult: Secondary | ICD-10-CM | POA: Diagnosis not present

## 2020-08-11 NOTE — Progress Notes (Signed)
Primary Care Physician: [name], MD Primary Cardiologist: Dr Debara Pickett Primary Electrophysiologist: Dr Curt Bears Referring Physician: Dr Reino Bellis Witucki is a 75 y.o. male with a history of HTN, tobacco abuse, NSVT, and paroxysmal atrial fibrillation who presents for follow up in the [facility]. Patient was noted to be in afib at his routine follow up with Dr Curt Bears on 03/11/19. Patient had just had a colonoscopy prior to the visit and his Xarelto had been held for the procedure. After 3 weeks of uninterrupted anticoagulation, he was started on flecainide with a plan to cardiovert. Patient called [facility] 04/02/19 with symptoms of dizziness and SOB after 2 doses of the flecainide. His reported HR was 42. He is on Xarelto for a CHADS2VASC score of 4. He was in a junctional rhythm and flecainide was stopped. He had recurrent afib and was started on amiodarone. Unfortunately, he developed GI side effects and this was also discontinued.   On follow up today, patient reports that he is doing well from a cardiac standpoint today. He is in SR. He denies any bleeding issues on anticoagulation.   Today, he denies symptoms of palpitations, chest pain, orthopnea, PND, lower extremity edema, presyncope, syncope, bleeding, or neurologic sequela. The patient is tolerating medications without difficulties and is otherwise without complaint today.    Atrial Fibrillation Risk Factors:  he does have symptoms or diagnosis of sleep apnea. he is compliant with CPAP therapy. he does not have a history of rheumatic fever. he does not have a history of alcohol use. The patient does not have a history of early familial atrial fibrillation or other arrhythmias.  he has a BMI of Body mass index is 42.97 kg/m.Marland Kitchen Filed Weights   08/11/20 0900  Weight: 117.1 kg    Family History  Problem Relation Age of Onset  . Heart disease Brother   . COPD Brother   . Diabetes Mother       Atrial Fibrillation Management history:  Previous antiarrhythmic drugs: flecainide, amiodarone  Previous cardioversions: none Previous ablations: none CHADS2VASC score: 4 Anticoagulation history: Xarelto   Past Medical History:  Diagnosis Date  . A-fib ([place])   . Abnormal EKG 06/18/2015  . COPD (chronic obstructive pulmonary disease) ([place])   . COPD with exacerbation ([place])   . Faintness 06/18/2015  . Hypertension   . LBBB (left bundle branch block) 06/18/2015  . Obesity   . Syncope 08/06/2015  . Tobacco use   . V-tach [facility]) 08/06/2015   Past Surgical History:  Procedure Laterality Date  . COLONOSCOPY WITH PROPOFOL N/A 03/07/2019   Procedure: COLONOSCOPY WITH PROPOFOL;  Surgeon: Carol Ada, MD;  Location: [facility];  Service: Endoscopy;  Laterality: N/A;  . POLYPECTOMY  03/07/2019   Procedure: POLYPECTOMY;  Surgeon: Carol Ada, MD;  Location: [facility];  Service: Endoscopy;;    Current Outpatient Medications  Medication Sig Dispense Refill  . Ascorbic Acid (VITAMIN C) 1000 MG tablet Take 1,000 mg by mouth daily.    . famotidine (PEPCID) 20 MG tablet Take 20 mg by mouth at bedtime.     . furosemide (LASIX) 40 MG tablet TAKE 1 AND 1/2 TABLETS(60 MG) BY MOUTH TWICE DAILY 270 tablet 3  . irbesartan (AVAPRO) 300 MG tablet TAKE 1 TABLET(300 MG) BY MOUTH DAILY 90 tablet 3  . latanoprost (XALATAN) 0.005 % ophthalmic solution Place 1 drop into both eyes at bedtime.    . rivaroxaban (XARELTO) 20 MG TABS tablet Take 1 tablet (20  mg total) by mouth daily with supper. LABS NEEDED FOR FURTHER REFILLS 90 tablet 0  . rosuvastatin (CRESTOR) 20 MG tablet Take 20 mg by mouth daily.    . sodium chloride (OCEAN) 0.65 % SOLN nasal spray Place 1 spray into both nostrils as needed for congestion.    Marland Kitchen spironolactone (ALDACTONE) 25 MG tablet Take 1 tablet (25 mg total) by mouth daily. 90 tablet 3   No current facility-administered medications for this encounter.    Allergies   Allergen Reactions  . Amiodarone Nausea Only    Nausea, abdominal pain, SOB, dizziness  . Diltiazem Shortness Of Breath  . Flecainide Shortness Of Breath    Social History   Socioeconomic History  . Marital status: Married    Spouse name: Not on file  . Number of children: Not on file  . Years of education: Not on file  . Highest education level: Not on file  Occupational History  . Not on file  Tobacco Use  . Smoking status: Former Smoker    Packs/day: 2.00    Years: 60.00    Pack years: 120.00    Types: Cigarettes    Quit date: 06/13/2017    Years since quitting: 3.1  . Smokeless tobacco: Never Used  Vaping Use  . Vaping Use: Never used  Substance and Sexual Activity  . Alcohol use: No    Alcohol/week: 0.0 standard drinks  . Drug use: No  . Sexual activity: Not on file  Other Topics Concern  . Not on file  Social History Narrative  . Not on file   Social Determinants of Health   Financial Resource Strain: Not on file  Food Insecurity: Not on file  Transportation Needs: Not on file  Physical Activity: Not on file  Stress: Not on file  Social Connections: Not on file  Intimate Partner Violence: Not on file     ROS- All systems are reviewed and negative except as per the HPI above.  Physical Exam: Vitals:   08/11/20 0900  BP: (!) 114/58  Pulse: 61  Weight: 117.1 kg  Height: 5\' 5"  (1.651 m)    GEN- The patient is a well appearing obese elderly male, alert and oriented x 3 today.   HEENT-head normocephalic, atraumatic, sclera clear, conjunctiva pink, hearing intact, trachea midline. Lungs- Clear to ausculation bilaterally, normal work of breathing Heart- Regular rate and rhythm, no rubs or gallops. 2/6 systolic murmur  GI- soft, NT, ND, + BS Extremities- no clubbing, cyanosis, or edema MS- no significant deformity or atrophy Skin- no rash or lesion Psych- euthymic mood, full affect Neuro- strength and sensation are intact   Wt Readings from Last  3 Encounters:  08/11/20 117.1 kg  07/30/20 117.7 kg  02/03/20 116.4 kg    EKG today demonstrates  SR, RBBB, LAFB Vent. rate 61 BPM PR interval 184 ms QRS duration 122 ms QT/QTc 476/479 ms  Echo 04/29/2018 demonstrated  - Left ventricle: The cavity size was normal. Wall thickness was   normal. Systolic function was normal. The estimated ejection   fraction was in the range of 60% to 65%. Wall motion was normal;   there were no regional wall motion abnormalities. Doppler   parameters are consistent with abnormal left ventricular   relaxation (grade 1 diastolic dysfunction).  Epic records are reviewed at length today  Assessment and Plan:  1. Paroxysmal atrial fibrillation Patient is in SR today. We discussed therapeutic options today including dofetilide admission vs ablation. He is unsure  at this time what he would like to do. Information provided about dofetilide. He would like to speak to Dr Curt Bears more specifically about ablation before making a decision.  Patient has failed amiodarone and flecainide.  Continue Xarelto 20 mg daily  This patients CHA2DS2-VASc Score and unadjusted Ischemic Stroke Rate (% per year) is equal to 4.8 % stroke rate/year from a score of 4  Above score calculated as 1 point each if present [CHF, HTN, DM, Vascular=MI/PAD/Aortic Plaque, Age if 65-74, or Male] Above score calculated as 2 points each if present [Age > 75, or Stroke/TIA/TE]  2. Obesity Body mass index is 42.97 kg/m. Lifestyle modification was discussed and encouraged including regular physical activity and weight reduction.  3. OSA Patient reports compliance with CPAP therapy.  4. HTN Stable, no changes today.   Follow up with Dr Curt Bears to discuss dofetilide vs ablation.    [facility] [address] [place], [place] [address] [phone] 08/11/2020 10:35 AM

## 2020-08-17 ENCOUNTER — Telehealth (INDEPENDENT_AMBULATORY_CARE_PROVIDER_SITE_OTHER): Payer: Medicare Other | Admitting: Cardiology

## 2020-08-17 ENCOUNTER — Other Ambulatory Visit: Payer: Self-pay

## 2020-08-17 ENCOUNTER — Encounter: Payer: Self-pay | Admitting: Cardiology

## 2020-08-17 VITALS — BP 138/57 | HR 68

## 2020-08-17 DIAGNOSIS — I480 Paroxysmal atrial fibrillation: Secondary | ICD-10-CM | POA: Diagnosis not present

## 2020-08-17 NOTE — Progress Notes (Signed)
Electrophysiology TeleHealth Note   Due to national recommendations of social distancing due to COVID 19, an audio/video telehealth visit is felt to be most appropriate for this patient at this time.  See Epic message for the patient's consent to telehealth for [facility].   Date:  08/17/2020   ID:  Derrick Alexander, DOB May 20, 1946, MRN [id number]  Location: patient's home  Provider location: [address], [place]  Evaluation Performed: Follow-up visit  PCP:  [name], MD  Cardiologist:  Pixie Casino, MD  Electrophysiologist:  Dr Curt Bears  Chief Complaint:  AF  History of Present Illness:    Derrick Alexander is a 75 y.o. male who presents via audio/video conferencing for a telehealth visit today.  Since last being seen in our clinic, the patient reports doing very well.  Today, he denies symptoms of palpitations, chest pain, shortness of breath,  lower extremity edema, dizziness, presyncope, or syncope.  The patient is otherwise without complaint today.  The patient denies symptoms of fevers, chills, cough, or new SOB worrisome for COVID 19.  He has a history of hypertension, tobacco abuse, and atrial fibrillation.  He was found to be in atrial fibrillation after colonoscopy.  He was initially started on flecainide with plans to cardiovert, but he developed junctional bradycardia.  Flecainide was stopped and he was started on amiodarone but had GI upset.  Today, denies symptoms of palpitations, chest pain, shortness of breath, orthopnea, PND, lower extremity edema, claudication, dizziness, presyncope, syncope, bleeding, or neurologic sequela. The patient is tolerating medications without difficulties.    Past Medical History:  Diagnosis Date  . A-fib ([place])   . Abnormal EKG 06/18/2015  . COPD (chronic obstructive pulmonary disease) ([place])   . COPD with exacerbation ([place])   . Faintness 06/18/2015  . Hypertension   . LBBB (left bundle branch block) 06/18/2015  .  Obesity   . Syncope 08/06/2015  . Tobacco use   . V-tach [facility]) 08/06/2015    Past Surgical History:  Procedure Laterality Date  . COLONOSCOPY WITH PROPOFOL N/A 03/07/2019   Procedure: COLONOSCOPY WITH PROPOFOL;  Surgeon: Carol Ada, MD;  [facility];  Service: Endoscopy;  Laterality: N/A;  . POLYPECTOMY  03/07/2019   Procedure: POLYPECTOMY;  Surgeon: Carol Ada, MD;  [facility];  Service: Endoscopy;;    Current Outpatient Medications  Medication Sig Dispense Refill  . Ascorbic Acid (VITAMIN C) 1000 MG tablet Take 1,000 mg by mouth daily.    . famotidine (PEPCID) 20 MG tablet Take 20 mg by mouth at bedtime.     . furosemide (LASIX) 40 MG tablet TAKE 1 AND 1/2 TABLETS(60 MG) BY MOUTH TWICE DAILY 270 tablet 3  . irbesartan (AVAPRO) 300 MG tablet TAKE 1 TABLET(300 MG) BY MOUTH DAILY 90 tablet 3  . latanoprost (XALATAN) 0.005 % ophthalmic solution Place 1 drop into both eyes at bedtime.    . rivaroxaban (XARELTO) 20 MG TABS tablet Take 1 tablet (20 mg total) by mouth daily with supper. LABS NEEDED FOR FURTHER REFILLS 90 tablet 0  . rosuvastatin (CRESTOR) 20 MG tablet Take 20 mg by mouth daily.    . sodium chloride (OCEAN) 0.65 % SOLN nasal spray Place 1 spray into both nostrils as needed for congestion.    Marland Kitchen spironolactone (ALDACTONE) 25 MG tablet Take 1 tablet (25 mg total) by mouth daily. 90 tablet 3   No current facility-administered medications for this visit.    Allergies:   Amiodarone, Diltiazem,  and Flecainide   Social History:  The patient  reports that he quit smoking about 3 years ago. His smoking use included cigarettes. He has a 120.00 pack-year smoking history. He has never used smokeless tobacco. He reports that he does not drink alcohol and does not use drugs.   Family History:  The patient's  family history includes COPD in his brother; Diabetes in his mother; Heart disease in his brother.   ROS:  Please see the history of present illness.    All other systems are personally reviewed and negative.    Exam:    Vital Signs:  BP (!) 138/57   Pulse 68   alert and conversant, regular work of breathing   Labs/Other Tests and Data Reviewed:    Recent Labs: 07/30/2020: ALT 18; TSH 0.569   Wt Readings from Last 3 Encounters:  08/11/20 258 lb 3.2 oz (117.1 kg)  07/30/20 259 lb 6.4 oz (117.7 kg)  02/03/20 256 lb 9.6 oz (116.4 kg)     Other studies personally reviewed: Additional studies/ records that were reviewed today include: TTE 08/11/2020 personally reviewed Review of the above records today demonstrates: Sinus rhythm, right bundle branch block, rate 61  ASSESSMENT & PLAN:    1.  Paroxysmal atrial fibrillation: Patient has failed flecainide.  I have offered him either ablation or dofetilide.  Currently on Xarelto.  CHA2DS2-VASc of 4.  At this point, he would prefer to avoid medications.  Due to that, we [name] plan for ablation.  Risk benefits of been discussed.  Risk of bleeding, stroke, damage to chest organs among others.  He understands these risks and is agreed to the procedure.  2.  Obstructive sleep apnea: CPAP compliance encouraged  3.  Hypertension: Mildly elevated but usually well controlled.  No changes.  4.  Obesity: BMI of 43.  Lifestyle modification encouraged.   COVID 19 screen The patient denies symptoms of COVID 19 at this time.  The importance of social distancing was discussed today.  Follow-up: 3 months  Current medicines are reviewed at length with the patient today.   The patient does not have concerns regarding his medicines.  The following changes were made today:  none  Labs/ tests ordered today include:  No orders of the defined types were placed in this encounter.    Patient Risk:  after full review of this patients clinical status, I feel that they are at moderate risk at this time.     Signed, Candace Begue [name], MD  08/17/2020 3:49 PM     [facility] [address] [place] [address] [phone] (office) [phone] (fax)

## 2020-08-20 ENCOUNTER — Ambulatory Visit: Payer: Medicare Other

## 2020-08-23 DIAGNOSIS — H401131 Primary open-angle glaucoma, bilateral, mild stage: Secondary | ICD-10-CM | POA: Diagnosis not present

## 2020-09-01 ENCOUNTER — Telehealth: Payer: Self-pay | Admitting: *Deleted

## 2020-09-01 DIAGNOSIS — Z01812 Encounter for preprocedural laboratory examination: Secondary | ICD-10-CM

## 2020-09-01 DIAGNOSIS — I4819 Other persistent atrial fibrillation: Secondary | ICD-10-CM

## 2020-09-01 NOTE — Telephone Encounter (Signed)
Reviewed CT and ablation instructions w/ wife and pt. Aware I will send instructions via mychart. Aware office will call to arrange pre procedure CT testing and post procedure follow up. Covid test scheduled for 4/27. Patient & wife verbalized understanding and agreeable to plan.

## 2020-09-06 ENCOUNTER — Other Ambulatory Visit: Payer: Medicare Other

## 2020-09-08 ENCOUNTER — Telehealth (HOSPITAL_COMMUNITY): Payer: Self-pay | Admitting: Emergency Medicine

## 2020-09-08 ENCOUNTER — Other Ambulatory Visit: Payer: Medicare Other | Admitting: *Deleted

## 2020-09-08 ENCOUNTER — Other Ambulatory Visit: Payer: Self-pay

## 2020-09-08 DIAGNOSIS — I4819 Other persistent atrial fibrillation: Secondary | ICD-10-CM | POA: Diagnosis not present

## 2020-09-08 DIAGNOSIS — Z01812 Encounter for preprocedural laboratory examination: Secondary | ICD-10-CM | POA: Diagnosis not present

## 2020-09-08 LAB — CBC
Hematocrit: 35.9 % — ABNORMAL LOW (ref 37.5–51.0)
Hemoglobin: 12 g/dL — ABNORMAL LOW (ref 13.0–17.7)
MCH: 29 pg (ref 26.6–33.0)
MCHC: 33.4 g/dL (ref 31.5–35.7)
MCV: 87 fL (ref 79–97)
Platelets: 206 10*3/uL (ref 150–450)
RBC: 4.14 x10E6/uL (ref 4.14–5.80)
RDW: 15.2 % (ref 11.6–15.4)
WBC: 8.2 10*3/uL (ref 3.4–10.8)

## 2020-09-08 LAB — BASIC METABOLIC PANEL
BUN/Creatinine Ratio: 14 (ref 10–24)
BUN: 14 mg/dL (ref 8–27)
CO2: 25 mmol/L (ref 20–29)
Calcium: 9.1 mg/dL (ref 8.6–10.2)
Chloride: 101 mmol/L (ref 96–106)
Creatinine, Ser: 1.02 mg/dL (ref 0.76–1.27)
Glucose: 105 mg/dL — ABNORMAL HIGH (ref 65–99)
Potassium: 4.3 mmol/L (ref 3.5–5.2)
Sodium: 135 mmol/L (ref 134–144)
eGFR: 77 mL/min/{1.73_m2} (ref >59)

## 2020-09-08 NOTE — Telephone Encounter (Signed)
Attempted to call patient regarding upcoming cardiac CT appointment. °Left message on voicemail with name and callback number °Joyann Spidle RN Navigator Cardiac Imaging °[facility] °[phone] Office °[phone] Cell ° °

## 2020-09-08 NOTE — Telephone Encounter (Signed)
Pt returning phone call regarding upcoming cardiac imaging study; pt verbalizes understanding of appt date/time, parking situation and where to check in, pre-test NPO status and medications ordered, and verified current allergies; name and call back number provided for further questions should they arise Derrick Bond RN Navigator Cardiac Imaging [facility] [phone] office [phone] cell   Hold diuretics

## 2020-09-10 ENCOUNTER — Other Ambulatory Visit: Payer: Self-pay

## 2020-09-10 ENCOUNTER — Ambulatory Visit (HOSPITAL_COMMUNITY)
Admission: RE | Admit: 2020-09-10 | Discharge: 2020-09-10 | Disposition: A | Discharge status: Home / Self Care | Payer: Medicare Other | Source: Ambulatory Visit | Attending: Cardiology | Admitting: Cardiology

## 2020-09-10 DIAGNOSIS — I4819 Other persistent atrial fibrillation: Secondary | ICD-10-CM

## 2020-09-10 MED ORDER — IOHEXOL 350 MG/ML SOLN
100.0000 mL | Freq: Once | INTRAVENOUS | Status: AC | PRN
  Administered 2020-09-10: 100 mL via INTRAVENOUS

## 2020-09-15 ENCOUNTER — Other Ambulatory Visit (HOSPITAL_COMMUNITY)
Admission: RE | Admit: 2020-09-15 | Discharge: 2020-09-15 | Disposition: A | Discharge status: Home / Self Care | Payer: Medicare Other | Source: Ambulatory Visit | Attending: Cardiology | Admitting: Cardiology

## 2020-09-15 DIAGNOSIS — Z20822 Contact with and (suspected) exposure to covid-19: Secondary | ICD-10-CM | POA: Insufficient documentation

## 2020-09-15 DIAGNOSIS — Z01812 Encounter for preprocedural laboratory examination: Secondary | ICD-10-CM | POA: Insufficient documentation

## 2020-09-15 LAB — SARS CORONAVIRUS 2 (TAT 6-24 HRS): SARS Coronavirus 2: NEGATIVE

## 2020-09-17 ENCOUNTER — Encounter (HOSPITAL_COMMUNITY)
Admission: RE | Disposition: A | Discharge status: Home / Self Care | Payer: Self-pay | Source: Home / Self Care | Attending: Cardiology

## 2020-09-17 ENCOUNTER — Ambulatory Visit (HOSPITAL_COMMUNITY): Payer: Medicare Other | Admitting: Certified Registered"

## 2020-09-17 ENCOUNTER — Other Ambulatory Visit: Payer: Self-pay

## 2020-09-17 ENCOUNTER — Ambulatory Visit (HOSPITAL_COMMUNITY)
Admission: RE | Admit: 2020-09-17 | Discharge: 2020-09-18 | Disposition: A | Discharge status: Home / Self Care | Payer: Medicare Other | Attending: Cardiology | Admitting: Cardiology

## 2020-09-17 DIAGNOSIS — R001 Bradycardia, unspecified: Secondary | ICD-10-CM | POA: Diagnosis not present

## 2020-09-17 DIAGNOSIS — I447 Left bundle-branch block, unspecified: Secondary | ICD-10-CM | POA: Insufficient documentation

## 2020-09-17 DIAGNOSIS — I4819 Other persistent atrial fibrillation: Secondary | ICD-10-CM | POA: Diagnosis not present

## 2020-09-17 DIAGNOSIS — R55 Syncope and collapse: Secondary | ICD-10-CM | POA: Insufficient documentation

## 2020-09-17 DIAGNOSIS — I4891 Unspecified atrial fibrillation: Secondary | ICD-10-CM | POA: Diagnosis not present

## 2020-09-17 DIAGNOSIS — I10 Essential (primary) hypertension: Secondary | ICD-10-CM | POA: Diagnosis not present

## 2020-09-17 DIAGNOSIS — Z888 Allergy status to other drugs, medicaments and biological substances status: Secondary | ICD-10-CM | POA: Insufficient documentation

## 2020-09-17 DIAGNOSIS — J441 Chronic obstructive pulmonary disease with (acute) exacerbation: Secondary | ICD-10-CM | POA: Diagnosis not present

## 2020-09-17 DIAGNOSIS — G4733 Obstructive sleep apnea (adult) (pediatric): Secondary | ICD-10-CM | POA: Diagnosis not present

## 2020-09-17 DIAGNOSIS — I472 Ventricular tachycardia: Secondary | ICD-10-CM | POA: Insufficient documentation

## 2020-09-17 DIAGNOSIS — Z87891 Personal history of nicotine dependence: Secondary | ICD-10-CM | POA: Insufficient documentation

## 2020-09-17 DIAGNOSIS — I480 Paroxysmal atrial fibrillation: Secondary | ICD-10-CM | POA: Diagnosis present

## 2020-09-17 HISTORY — PX: ATRIAL FIBRILLATION ABLATION: EP1191

## 2020-09-17 LAB — POCT ACTIVATED CLOTTING TIME
Activated Clotting Time: 398 seconds
Activated Clotting Time: 452 seconds
Activated Clotting Time: 339 seconds
Activated Clotting Time: 356 seconds

## 2020-09-17 LAB — GLUCOSE, CAPILLARY: Glucose-Capillary: 139 mg/dL — ABNORMAL HIGH (ref 70–99)

## 2020-09-17 SURGERY — ATRIAL FIBRILLATION ABLATION
Anesthesia: General

## 2020-09-17 MED ORDER — LATANOPROST 0.005 % OP SOLN
1.0000 [drp] | Freq: Every day | OPHTHALMIC | Status: DC
  Administered 2020-09-17: 1 [drp] via OPHTHALMIC
  Filled 2020-09-17: qty 2.5

## 2020-09-17 MED ORDER — SODIUM CHLORIDE 0.9% FLUSH: 3.0000 mL | INTRAVENOUS | Status: DC | PRN

## 2020-09-17 MED ORDER — IBUTILIDE FUMARATE 1 MG/10ML IV SOLN: INTRAVENOUS | Status: DC | PRN

## 2020-09-17 MED ORDER — ASCORBIC ACID 500 MG PO TABS
500.0000 mg | ORAL_TABLET | Freq: Every day | ORAL | Status: DC
  Administered 2020-09-17 – 2020-09-18 (×2): 500 mg via ORAL
  Filled 2020-09-17 (×2): qty 1

## 2020-09-17 MED ORDER — RIVAROXABAN 20 MG PO TABS: 20.0000 mg | ORAL_TABLET | Freq: Every day | ORAL | Status: DC

## 2020-09-17 MED ORDER — HEPARIN SODIUM (PORCINE) 1000 UNIT/ML IJ SOLN
INTRAMUSCULAR | Status: AC
  Filled 2020-09-17: qty 1

## 2020-09-17 MED ORDER — HEPARIN SODIUM (PORCINE) 1000 UNIT/ML IJ SOLN
INTRAMUSCULAR | Status: DC | PRN
  Administered 2020-09-17: 2000 [IU] via INTRAVENOUS
  Administered 2020-09-17: 15000 [IU] via INTRAVENOUS

## 2020-09-17 MED ORDER — ONDANSETRON HCL 4 MG/2ML IJ SOLN: 4.0000 mg | Freq: Four times a day (QID) | INTRAMUSCULAR | Status: DC | PRN

## 2020-09-17 MED ORDER — SPIRONOLACTONE 25 MG PO TABS
25.0000 mg | ORAL_TABLET | Freq: Every day | ORAL | Status: DC
  Administered 2020-09-17 – 2020-09-18 (×2): 25 mg via ORAL
  Filled 2020-09-17 (×2): qty 1

## 2020-09-17 MED ORDER — SUCCINYLCHOLINE CHLORIDE 20 MG/ML IJ SOLN
INTRAMUSCULAR | Status: DC | PRN
  Administered 2020-09-17: 120 mg via INTRAVENOUS

## 2020-09-17 MED ORDER — OXYCODONE HCL 5 MG PO TABS
5.0000 mg | ORAL_TABLET | Freq: Once | ORAL | Status: DC | PRN
Start: 2020-09-17 — End: 2020-09-17

## 2020-09-17 MED ORDER — ACETAMINOPHEN 325 MG PO TABS: 650.0000 mg | ORAL_TABLET | ORAL | Status: DC | PRN

## 2020-09-17 MED ORDER — SODIUM CHLORIDE 0.9 % IV SOLN: INTRAVENOUS | Status: DC

## 2020-09-17 MED ORDER — MAGNESIUM SULFATE 50 % IJ SOLN
INTRAMUSCULAR | Status: AC
  Filled 2020-09-17: qty 2

## 2020-09-17 MED ORDER — FENTANYL CITRATE (PF) 100 MCG/2ML IJ SOLN
INTRAMUSCULAR | Status: DC | PRN
  Administered 2020-09-17 (×2): 50 ug via INTRAVENOUS

## 2020-09-17 MED ORDER — OXYCODONE HCL 5 MG/5ML PO SOLN: 5.0000 mg | Freq: Once | ORAL | Status: DC | PRN

## 2020-09-17 MED ORDER — HYDROMORPHONE HCL 1 MG/ML IJ SOLN: 0.2500 mg | INTRAMUSCULAR | Status: DC | PRN

## 2020-09-17 MED ORDER — PROPOFOL 10 MG/ML IV BOLUS
INTRAVENOUS | Status: DC | PRN
  Administered 2020-09-17: 150 mg via INTRAVENOUS
  Administered 2020-09-17: 30 mg via INTRAVENOUS

## 2020-09-17 MED ORDER — ONDANSETRON HCL 4 MG/2ML IJ SOLN
INTRAMUSCULAR | Status: DC | PRN
  Administered 2020-09-17: 4 mg via INTRAVENOUS

## 2020-09-17 MED ORDER — FAMOTIDINE 20 MG PO TABS
20.0000 mg | ORAL_TABLET | Freq: Every day | ORAL | Status: DC
  Administered 2020-09-17: 20 mg via ORAL
  Filled 2020-09-17: qty 1

## 2020-09-17 MED ORDER — PROTAMINE SULFATE 10 MG/ML IV SOLN
INTRAVENOUS | Status: DC | PRN
  Administered 2020-09-17: 40 mg via INTRAVENOUS

## 2020-09-17 MED ORDER — SALINE SPRAY 0.65 % NA SOLN
1.0000 | NASAL | Status: DC | PRN
  Filled 2020-09-17: qty 44

## 2020-09-17 MED ORDER — HEPARIN (PORCINE) IN NACL 1000-0.9 UT/500ML-% IV SOLN
INTRAVENOUS | Status: DC | PRN
  Administered 2020-09-17 (×5): 500 mL

## 2020-09-17 MED ORDER — LIDOCAINE 2% (20 MG/ML) 5 ML SYRINGE
INTRAMUSCULAR | Status: DC | PRN
  Administered 2020-09-17: 100 mg via INTRAVENOUS

## 2020-09-17 MED ORDER — DEXAMETHASONE SODIUM PHOSPHATE 10 MG/ML IJ SOLN
INTRAMUSCULAR | Status: DC | PRN
  Administered 2020-09-17: 10 mg via INTRAVENOUS

## 2020-09-17 MED ORDER — RIVAROXABAN 20 MG PO TABS
20.0000 mg | ORAL_TABLET | Freq: Every day | ORAL | Status: DC
  Administered 2020-09-17: 20 mg via ORAL
  Filled 2020-09-17: qty 1

## 2020-09-17 MED ORDER — ADENOSINE 6 MG/2ML IV SOLN
INTRAVENOUS | Status: AC
  Filled 2020-09-17: qty 4

## 2020-09-17 MED ORDER — PROMETHAZINE HCL 25 MG/ML IJ SOLN: 6.2500 mg | INTRAMUSCULAR | Status: DC | PRN

## 2020-09-17 MED ORDER — ESTER-C PO TABS: 1.0000 | ORAL_TABLET | Freq: Every day | ORAL | Status: DC

## 2020-09-17 MED ORDER — SUGAMMADEX SODIUM 200 MG/2ML IV SOLN
INTRAVENOUS | Status: DC | PRN
  Administered 2020-09-17 (×2): 50 mg via INTRAVENOUS
  Administered 2020-09-17: 100 mg via INTRAVENOUS

## 2020-09-17 MED ORDER — ROCURONIUM BROMIDE 10 MG/ML (PF) SYRINGE: PREFILLED_SYRINGE | INTRAVENOUS | Status: DC | PRN

## 2020-09-17 MED ORDER — ADENOSINE 6 MG/2ML IV SOLN
INTRAVENOUS | Status: DC | PRN
  Administered 2020-09-17: 12 mg via INTRAVENOUS

## 2020-09-17 MED ORDER — PHENYLEPHRINE HCL-NACL 10-0.9 MG/250ML-% IV SOLN
INTRAVENOUS | Status: DC | PRN
  Administered 2020-09-17: 65 ug/min via INTRAVENOUS

## 2020-09-17 MED ORDER — HEPARIN SODIUM (PORCINE) 1000 UNIT/ML IJ SOLN
INTRAMUSCULAR | Status: DC | PRN
  Administered 2020-09-17: 1000 [IU] via INTRAVENOUS

## 2020-09-17 MED ORDER — MAGNESIUM SULFATE 50 % IJ SOLN
INTRAVENOUS | Status: AC | PRN
  Administered 2020-09-17: 1 g via INTRAVENOUS

## 2020-09-17 MED ORDER — SODIUM CHLORIDE 0.9 % IV SOLN: 250.0000 mL | INTRAVENOUS | Status: DC | PRN

## 2020-09-17 MED ORDER — SODIUM CHLORIDE 0.9 % IV SOLN: Freq: Once | INTRAVENOUS | Status: AC

## 2020-09-17 MED ORDER — HYDRALAZINE HCL 20 MG/ML IJ SOLN: 10.0000 mg | INTRAMUSCULAR | Status: DC | PRN

## 2020-09-17 MED ORDER — ROCURONIUM BROMIDE 100 MG/10ML IV SOLN
INTRAVENOUS | Status: DC | PRN
  Administered 2020-09-17: 70 mg via INTRAVENOUS

## 2020-09-17 MED ORDER — SODIUM CHLORIDE 0.9% FLUSH
3.0000 mL | Freq: Two times a day (BID) | INTRAVENOUS | Status: DC
  Administered 2020-09-17 – 2020-09-18 (×2): 3 mL via INTRAVENOUS

## 2020-09-17 MED ORDER — IBUTILIDE FUMARATE 1 MG/10ML IV SOLN
INTRAVENOUS | Status: AC
  Filled 2020-09-17: qty 10

## 2020-09-17 MED ORDER — ROSUVASTATIN CALCIUM 20 MG PO TABS
20.0000 mg | ORAL_TABLET | Freq: Every day | ORAL | Status: DC
  Administered 2020-09-17 – 2020-09-18 (×2): 20 mg via ORAL
  Filled 2020-09-17 (×2): qty 1

## 2020-09-17 MED ORDER — IBUTILIDE FUMARATE 1 MG/10ML IV SOLN
INTRAVENOUS | Status: AC | PRN
  Administered 2020-09-17: 1 mg via INTRAVENOUS

## 2020-09-17 SURGICAL SUPPLY — 22 items
BAG SNAP BAND KOVER 36X36 (MISCELLANEOUS) ×1 IMPLANT
BLANKET WARM UNDERBOD FULL ACC (MISCELLANEOUS) ×2 IMPLANT
CATH MAPPNG PENTARAY F 2-6-2MM (CATHETERS) IMPLANT
CATH S CIRCA THERM PROBE 10F (CATHETERS) ×1 IMPLANT
CATH SMTCH THERMOCOOL SF DF (CATHETERS) ×1 IMPLANT
CATH SOUNDSTAR ECO 8FR (CATHETERS) ×1 IMPLANT
CATH WEBSTER BI DIR CS D-F CRV (CATHETERS) ×1 IMPLANT
CLOSURE PERCLOSE PROSTYLE (VASCULAR PRODUCTS) ×4 IMPLANT
COVER SWIFTLINK CONNECTOR (BAG) ×2 IMPLANT
KIT VERSACROSS STEERABLE D1 (CATHETERS) ×1 IMPLANT
MAT PREVALON FULL STRYKER (MISCELLANEOUS) ×1 IMPLANT
PACK EP LATEX FREE (CUSTOM PROCEDURE TRAY) ×2
PACK EP LF (CUSTOM PROCEDURE TRAY) ×1 IMPLANT
PAD PRO RADIOLUCENT 2001M-C (PAD) ×2 IMPLANT
PATCH CARTO3 (PAD) ×1 IMPLANT
PENTARAY F 2-6-2MM (CATHETERS) ×2
SHEATH CARTO VIZIGO SM CVD (SHEATH) ×1 IMPLANT
SHEATH PINNACLE 7F 10CM (SHEATH) ×1 IMPLANT
SHEATH PINNACLE 8F 10CM (SHEATH) ×2 IMPLANT
SHEATH PINNACLE 9F 10CM (SHEATH) ×1 IMPLANT
SHEATH PROBE COVER 6X72 (BAG) ×1 IMPLANT
TUBING SMART ABLATE COOLFLOW (TUBING) ×1 IMPLANT

## 2020-09-17 NOTE — H&P (Signed)
Electrophysiology Office Note   Date:  09/17/2020   ID:  Derrick Alexander, DOB December 15, 1945, MRN [id number]  PCP:  [name], MD  Cardiologist:  Debara Pickett Primary Electrophysiologist:  Pacey Willadsen [name], MD    No chief complaint on file.    History of Present Illness: Derrick Alexander is a 75 y.o. male who presents today for electrophysiology evaluation.    He has a history significant for hypertension, left bundle branch block, tobacco abuse with a 60-pack-year history, and nonsustained VT.  He also has paroxysmal atrial fibrillation.  He wore a cardiac monitor that showed no obvious cause for syncope, but a 16 beat run of nonsustained VT.  He also had bradycardia in the 30s.  He was initially put on flecainide for his atrial fibrillation, was found to be bradycardic and flecainide was stopped.  Today, denies symptoms of palpitations, chest pain, shortness of breath, orthopnea, PND, lower extremity edema, claudication, dizziness, presyncope, syncope, bleeding, or neurologic sequela. The patient is tolerating medications without difficulties. Plan for AF ablation today.    Past Medical History:  Diagnosis Date  . A-fib ([place])   . Abnormal EKG 06/18/2015  . COPD (chronic obstructive pulmonary disease) ([place])   . COPD with exacerbation ([place])   . Faintness 06/18/2015  . Hypertension   . LBBB (left bundle branch block) 06/18/2015  . Obesity   . Syncope 08/06/2015  . Tobacco use   . V-tach [facility]) 08/06/2015   Past Surgical History:  Procedure Laterality Date  . COLONOSCOPY WITH PROPOFOL N/A 03/07/2019   Procedure: COLONOSCOPY WITH PROPOFOL;  Surgeon: Carol Ada, MD;  [facility];  Service: Endoscopy;  Laterality: N/A;  . POLYPECTOMY  03/07/2019   Procedure: POLYPECTOMY;  Surgeon: Carol Ada, MD;  [facility];  Service: Endoscopy;;     Current Facility-Administered Medications  Medication Dose Route Frequency Provider Last Rate Last Admin  . 0.9 %  sodium  chloride infusion   Intravenous Continuous Constance Haw, MD 50 mL/hr at 09/17/20 6789 Continued from Pre-op at 09/17/20 3810    Allergies:   Amiodarone, Diltiazem, and Flecainide   Social History:  The patient  reports that he quit smoking about 3 years ago. His smoking use included cigarettes. He has a 120.00 pack-year smoking history. He has never used smokeless tobacco. He reports that he does not drink alcohol and does not use drugs.   Family History:  The patient's family history includes COPD in his brother; Diabetes in his mother; Heart disease in his brother.   ROS:  Please see the history of present illness.   Otherwise, review of systems is positive for none.   All other systems are reviewed and negative.   PHYSICAL EXAM: VS:  BP 130/63   Pulse 81   Temp 97.9 F (36.6 C) (Oral)   Resp 18   Ht 5\' 5"  (1.651 m)   Wt 115.7 kg   SpO2 98%   BMI 42.43 kg/m  , BMI Body mass index is 42.43 kg/m. GEN: Well nourished, well developed, in no acute distress  HEENT: normal  Neck: no JVD, carotid bruits, or masses Cardiac: RRR; no murmurs, rubs, or gallops,no edema  Respiratory:  clear to auscultation bilaterally, normal work of breathing GI: soft, nontender, nondistended, + BS MS: no deformity or atrophy  Skin: warm and dry Neuro:  Strength and sensation are intact Psych: euthymic mood, full affect   Recent Labs: 07/30/2020: ALT 18; TSH 0.569 09/08/2020: BUN 14; Creatinine, Ser 1.02; Hemoglobin 12.0;  Platelets 206; Potassium 4.3; Sodium 135    Lipid Panel  No results found for: CHOL, TRIG, HDL, CHOLHDL, VLDL, LDLCALC, LDLDIRECT   Wt Readings from Last 3 Encounters:  09/17/20 115.7 kg  08/11/20 117.1 kg  07/30/20 117.7 kg      Other studies Reviewed: Additional studies/ records that were reviewed today include: Spect 06/25/15  Review of the above records today demonstrates:   The left ventricular ejection fraction is normal (55-65%).  Nuclear stress EF:  57%.  There was no ST segment deviation noted during stress.  The study is normal.  Normal stress nuclear study with no ischemia or infarction; EF 57 with normal wall motion.  Tele monitor 06/17/14 Monitor shows NSVT up 16 beat runs and periods of ventricular escape rhythm in the 30's.   TTE 04/29/18 - Left ventricle: The cavity size was normal. Wall thickness was   normal. Systolic function was normal. The estimated ejection   fraction was in the range of 60% to 65%. Wall motion was normal;   there were no regional wall motion abnormalities. Doppler   parameters are consistent with abnormal left ventricular   relaxation (grade 1 diastolic dysfunction).   ASSESSMENT AND PLAN:  1.  Nonsustained ventricular tachycardia: Currently on flecainide.  No further obvious episodes.  2.  Bradycardia: No further episodes.  3.  Syncope: No further episodes noted  4.  Persistent atrial fibrillation: Author Hatlestad has presented today for surgery, with the diagnosis of atrial fibrillation.  The various methods of treatment have been discussed with the patient and family. After consideration of risks, benefits and other options for treatment, the patient has consented to  Procedure(s): Catheter ablation as a surgical intervention .  Risks include but not limited to complete heart block, stroke, esophageal damage, nerve damage, bleeding, vascular damage, tamponade, perforation, MI, and death. The patient's history has been reviewed, patient examined, no change in status, stable for surgery.  I have reviewed the patient's chart and labs.  Questions were answered to the patient's satisfaction.    Anjali Manzella Curt Bears, MD 09/17/2020 7:05 AM

## 2020-09-17 NOTE — Transfer of Care (Signed)
Immediate Anesthesia Transfer of Care Note  Patient: Derrick Alexander  Procedure(s) Performed: ATRIAL FIBRILLATION ABLATION (N/A )  Patient Location: Cath Lab  Anesthesia Type:General  Level of Consciousness: awake, alert  and sedated  Airway & Oxygen Therapy: Patient connected to nasal cannula oxygen  Post-op Assessment: Post -op Vital signs reviewed and stable  Post vital signs: stable  Last Vitals:  Vitals Value Taken Time  BP    Temp    Pulse    Resp    SpO2      Last Pain:  Vitals:   09/17/20 0600  TempSrc:   PainSc: 0-No pain      Patients Stated Pain Goal: 3 (91/50/56 9794)  Complications: No complications documented.

## 2020-09-17 NOTE — Anesthesia Postprocedure Evaluation (Signed)
Anesthesia Post Note  Patient: Derrick Alexander  Procedure(s) Performed: ATRIAL FIBRILLATION ABLATION (N/A )     Patient location during evaluation: PACU Anesthesia Type: General Level of consciousness: awake and alert Pain management: pain level controlled Vital Signs Assessment: post-procedure vital signs reviewed and stable Respiratory status: spontaneous breathing, nonlabored ventilation and respiratory function stable Cardiovascular status: blood pressure returned to baseline and stable Postop Assessment: no apparent nausea or vomiting Anesthetic complications: no   No complications documented.  Last Vitals:  Vitals:   09/17/20 1215 09/17/20 1225  BP: (!) 111/41 112/79  Pulse: (!) 42 (!) 44  Resp: 16 18  Temp:    SpO2: 96% 100%    Last Pain:  Vitals:   09/17/20 1200  TempSrc: Temporal  PainSc: 3                  Lynda Rainwater

## 2020-09-17 NOTE — Progress Notes (Signed)
Dr Curt Bears notified of QT interval and client adm overnight, report called to 2-C-14 and client transferred via bed with cardiac monitor

## 2020-09-17 NOTE — Plan of Care (Signed)
  Problem: Education: Goal: Knowledge of disease or condition will improve Outcome: Progressing   Problem: Education: Goal: Understanding of medication regimen will improve Outcome: Progressing   Problem: Education: Goal: Individualized Educational Video(s) Outcome: Progressing   Problem: Activity: Goal: Ability to tolerate increased activity will improve Outcome: Progressing   Problem: Cardiac: Goal: Ability to achieve and maintain adequate cardiopulmonary perfusion will improve Outcome: Progressing   Problem: Health Behavior/Discharge Planning: Goal: Ability to safely manage health-related needs after discharge will improve Outcome: Progressing

## 2020-09-17 NOTE — Progress Notes (Signed)
Administering second 559ml bolus.  Pt is oriented x4 and follows all commands.  Drowsy at times.

## 2020-09-17 NOTE — Discharge Instructions (Signed)
Post procedure care instructions No driving for 4 days. No lifting over 5 lbs for 1 week. No vigorous or sexual activity for 1 week. You may return to work/your usual activities on 09/25/20. Keep procedure site clean & dry. If you notice increased pain, swelling, bleeding or pus, call/return!  You may shower after 24 hours, but no soaking in baths/hot tubs/pools for 1 week.      Femoral Site Care  This sheet gives you information about how to care for yourself after your procedure. Your health care provider may also give you more specific instructions. If you have problems or questions, contact your health care provider. What can I expect after the procedure? After the procedure, it is common to have:  Bruising that usually fades within 1-2 weeks.  Tenderness at the site. Follow these instructions at home: Wound care  Follow instructions from your health care provider about how to take care of your insertion site. Make sure you: ? Wash your hands with soap and water before you change your bandage (dressing). If soap and water are not available, use hand sanitizer. ? Change your dressing as told by your health care provider. ? Leave stitches (sutures), skin glue, or adhesive strips in place. These skin closures may need to stay in place for 2 weeks or longer. If adhesive strip edges start to loosen and curl up, you may trim the loose edges. Do not remove adhesive strips completely unless your health care provider tells you to do that.  Do not take baths, swim, or use a hot tub until your health care provider approves.  You may shower 24-48 hours after the procedure or as told by your health care provider. ? Gently wash the site with plain soap and water. ? Pat the area dry with a clean towel. ? Do not rub the site. This may cause bleeding.  Do not apply powder or lotion to the site. Keep the site clean and dry.  Check your femoral site every day for signs of infection. Check  for: ? Redness, swelling, or pain. ? Fluid or blood. ? Warmth. ? Pus or a bad smell. Activity  For the first 2-3 days after your procedure, or as long as directed: ? Avoid climbing stairs as much as possible. ? Do not squat.  Do not lift anything that is heavier than 10 lb (4.5 kg), or the limit that you are told, until your health care provider says that it is safe.  Rest as directed. ? Avoid sitting for a long time without moving. Get up to take short walks every 1-2 hours.  Do not drive for 24 hours if you were given a medicine to help you relax (sedative). General instructions  Take over-the-counter and prescription medicines only as told by your health care provider.  Keep all follow-up visits as told by your health care provider. This is important. Contact a health care provider if you have:  A fever or chills.  You have redness, swelling, or pain around your insertion site. Get help right away if:  The catheter insertion area swells very fast.  You pass out.  You suddenly start to sweat or your skin gets clammy.  The catheter insertion area is bleeding, and the bleeding does not stop when you hold steady pressure on the area.  The area near or just beyond the catheter insertion site becomes pale, cool, tingly, or numb. These symptoms may represent a serious problem that is an emergency. Do not wait  to see if the symptoms will go away. Get medical help right away. Call your local emergency services (911 in the [place] Do not drive yourself to the hospital. Summary  After the procedure, it is common to have bruising that usually fades within 1-2 weeks.  Check your femoral site every day for signs of infection.  Do not lift anything that is heavier than 10 lb (4.5 kg), or the limit that you are told, until your health care provider says that it is safe. This information is not intended to replace advice given to you by your health care provider. Make sure you discuss  any questions you have with your health care provider. Document Revised: 01/09/2020 Document Reviewed: 01/09/2020 Elsevier Patient Education  2021 [place] have an appointment set up with the [facility].  Multiple studies have shown that being followed by a dedicated atrial fibrillation clinic in addition to the standard care you receive from your other physicians improves health. We believe that enrollment in the atrial fibrillation clinic will allow [place] to better care for you.   The phone number to the [facility] is [phone]. The clinic is staffed Monday through Friday from 8:30am to 5pm.  Parking Directions: The clinic is located in the Heart and Vascular Building connected to [facility]. 1)From [address] turn on to [address] and go to the 3rd entrance  (Heart and Vascular entrance) on the right. 2)Look to the right for Heart &Vascular Parking Garage. 3)A code for the entrance is required, for May is 3211.   4)Take the elevators to the 1st floor. Registration is in the room with the glass walls at the end of the hallway.  If you have any trouble parking or locating the clinic, please don't hesitate to call [phone].

## 2020-09-17 NOTE — Progress Notes (Signed)
Dr. Curt Bears paged and returned call r/t decreased BP, orders given for NS bolus, see flowsheet and MAR, safety maintained, RN x2 at bedside

## 2020-09-17 NOTE — Progress Notes (Signed)
Oozing of small amount of SG drainage noted to right groin, drsg removed and manual pressure held for approximately 5 minutes, new gauze with tegaderm placed to right groin, left groin remains WNL, safety maintained

## 2020-09-17 NOTE — Anesthesia Preprocedure Evaluation (Signed)
Anesthesia Evaluation  Patient identified by MRN, date of birth, ID band Patient awake    Reviewed: Allergy & Precautions, NPO status , Patient's Chart, lab work & pertinent test results  Airway Mallampati: II  TM Distance: >3 FB Neck ROM: Full    Dental no notable dental hx. (+) Edentulous Upper, Edentulous Lower   Pulmonary COPD, former smoker,    Pulmonary exam normal breath sounds clear to auscultation       Cardiovascular hypertension, + DOE  Normal cardiovascular exam+ dysrhythmias Atrial Fibrillation and Ventricular Tachycardia  Rhythm:Regular Rate:Normal  Known LBBB  TTE 2019 EF 60-65%, G1DD, valves ok  Stress Test 2017 The left ventricular ejection fraction is normal (55-65%). Nuclear stress EF: 57%. There was no ST segment deviation noted during stress. The study is normal  Holter Monitor 2017 Monitor shows NSVT up 16 beat runs and periods of ventricular escape rhythm in the 30's.  Either could explain syncope. Please refer to EP asap for evaluation and management. His nuclear stress test was negative for ischemia with normal LV function. Hesitant to use b-blocker due to bradycardia. Will likely need AICD/PPM   Neuro/Psych negative neurological ROS  negative psych ROS   GI/Hepatic Neg liver ROS, GERD  Medicated,  Endo/Other  Morbid obesity  Renal/GU negative Renal ROS  negative genitourinary   Musculoskeletal negative musculoskeletal ROS (+)   Abdominal (+) + obese,   Peds  Hematology  (+) Blood dyscrasia (on xarelto, last dose 10/14), ,   Anesthesia Other Findings   Reproductive/Obstetrics                             Anesthesia Physical  Anesthesia Plan  ASA: III  Anesthesia Plan: General   Post-op Pain Management:    Induction: Intravenous  PONV Risk Score and Plan: 2 and Treatment may vary due to age or medical condition, Ondansetron and Midazolam  Airway  Management Planned: Oral ETT  Additional Equipment:   Intra-op Plan:   Post-operative Plan: Extubation in OR  Informed Consent: I have reviewed the patients History and Physical, chart, labs and discussed the procedure including the risks, benefits and alternatives for the proposed anesthesia with the patient or authorized representative who has indicated his/her understanding and acceptance.     Dental advisory given  Plan Discussed with: CRNA  Anesthesia Plan Comments:         Anesthesia Quick Evaluation

## 2020-09-17 NOTE — Anesthesia Procedure Notes (Signed)
Procedure Name: Intubation Date/Time: 09/17/2020 7:45 AM Performed by: Lavell Luster, CRNA Pre-anesthesia Checklist: Patient identified, Emergency Drugs available, Suction available, Patient being monitored and Timeout performed Patient Re-evaluated:Patient Re-evaluated prior to induction Oxygen Delivery Method: Circle system utilized Preoxygenation: Pre-oxygenation with 100% oxygen Induction Type: IV induction Ventilation: Mask ventilation without difficulty Laryngoscope Size: Mac and 4 Grade View: Grade III Tube type: Oral Tube size: 7.5 mm Number of attempts: 1 Airway Equipment and Method: Stylet Secured at: 22 cm Tube secured with: Tape Dental Injury: Teeth and Oropharynx as per pre-operative assessment

## 2020-09-18 DIAGNOSIS — Z87891 Personal history of nicotine dependence: Secondary | ICD-10-CM | POA: Diagnosis not present

## 2020-09-18 DIAGNOSIS — R001 Bradycardia, unspecified: Secondary | ICD-10-CM | POA: Diagnosis not present

## 2020-09-18 DIAGNOSIS — I4819 Other persistent atrial fibrillation: Secondary | ICD-10-CM

## 2020-09-18 DIAGNOSIS — R55 Syncope and collapse: Secondary | ICD-10-CM | POA: Diagnosis not present

## 2020-09-18 DIAGNOSIS — I472 Ventricular tachycardia: Secondary | ICD-10-CM | POA: Diagnosis not present

## 2020-09-18 DIAGNOSIS — I10 Essential (primary) hypertension: Secondary | ICD-10-CM | POA: Diagnosis not present

## 2020-09-18 DIAGNOSIS — I447 Left bundle-branch block, unspecified: Secondary | ICD-10-CM | POA: Diagnosis not present

## 2020-09-18 DIAGNOSIS — G4733 Obstructive sleep apnea (adult) (pediatric): Secondary | ICD-10-CM | POA: Diagnosis not present

## 2020-09-18 DIAGNOSIS — Z888 Allergy status to other drugs, medicaments and biological substances status: Secondary | ICD-10-CM | POA: Diagnosis not present

## 2020-09-18 NOTE — Discharge Summary (Addendum)
Discharge Summary    Patient ID: Derrick Alexander MRN: [id number]; DOB: 24-May-1945  Admit date: 09/17/2020 Discharge date: 09/18/2020  PCP:  [name], [place]  Cardiologist:  Pixie Casino, MD   Electrophysiologist:  Constance Haw, MD        Discharge Diagnoses    Active Problems:   Persistent atrial fibrillation [facility])   Diagnostic Studies/Procedures    PVI Ablation 09/17/20 CONCLUSIONS: 1. Atrial fibrillation upon presentation.   2. Successful electrical isolation and anatomical encircling of all four pulmonary veins with radiofrequency current.  A WACA approach was used 3. Additional left atrial ablation was performed with a standard box lesion created along the posterior wall of the left atrium 4. Atrial fibrillation successfully cardioverted to sinus rhythm. 5. No early apparent complications.  _____________   History of Present Illness     Derrick Alexander is a 75 y.o. male with hypertension, left bundle branch block, tobacco abuse with a 60-pack-year history, OSA, nonsustained VT and paroxysmal atrial fibrillation.  He has a hx of syncope.  He wore a cardiac monitor that showed no obvious cause for syncope, but a 16 beat run of nonsustained VT.  He also had bradycardia in the 30s.  He was initially put on flecainide for his atrial fibrillation, was found to be bradycardic and flecainide was stopped.  He was seen by Dr. Curt Bears in 07/2020 and noted to be back in AFib with significant symptoms.  He was placed on Amiodarone for rhythm control but had to stop due to GI side effects.  He was therefore set up for PVI Ablation.    Hospital Course     Consultants: none    The patient presented to the hospital on 09/17/2020 for PVI Ablation for atrial fibrillation.  During the procedure, the patient went into atrial fibrillation.  He was cardioverted several times and ultimately given Ibutilide.  He returned to normal sinus rhythm with this.   He was monitored overnight due to administration of ibutilide.  EKG this am demonstrated normal sinus rhythm with HR 77 and QTc 398 ms.  He was evaluated by Dr. Curt Bears this morning and felt to be stable for discharge to home.  He Derrick Alexander f/u in the AFib Clinic 10/19/20.   Did the patient have an acute coronary syndrome (MI, NSTEMI, STEMI, etc) this admission?:  No                               Did the patient have a percutaneous coronary intervention (stent / angioplasty)?:  No.       _____________  Discharge Vitals Blood pressure (!) 132/97, pulse 78, temperature 98.5 F (36.9 C), temperature source Oral, resp. rate 16, height 5\' 5"  (1.651 m), weight 115.7 kg, SpO2 100 %.  Filed Weights   09/17/20 0541  Weight: 115.7 kg    Labs & Radiologic Studies    _____________  EP STUDY  Result Date: 09/17/2020 SURGEON:  Allegra Lai, MD PREPROCEDURE DIAGNOSES: 1. Persistent atrial fibrillation. POSTPROCEDURE DIAGNOSES: 1. Persistent atrial fibrillation. PROCEDURES: 1. Comprehensive electrophysiologic study. 2. Coronary sinus pacing and recording. 3. Three-dimensional mapping of atrial fibrillation (with additional mapping and ablation within the left atrium due to persistence of afib) 4. Ablation of atrial fibrillation (with additional mapping and ablation within the left atrium due to persistence of afib) 5. Intracardiac echocardiography. 6. Transseptal puncture of an intact septum. 7. Arrhythmia induction with  pacing 8. External cardioversion. INTRODUCTION:  Derrick Alexander is a 75 y.o. male with a history of persistent atrial fibrillation who now presents for EP study and radiofrequency ablation.  The patient reports initially being diagnosed with atrial fibrillation after presenting with symptomatic palpitations and fatgiue.  The patient has failed medical therapy.  The patient therefore presents today for catheter ablation of atrial fibrillation. DESCRIPTION OF PROCEDURE:  Informed written consent was  obtained, and the patient was brought to the electrophysiology lab in a fasting state.  The patient was adequately sedated with intravenous medications as outlined in the anesthesia report.  The patient's left and right groins were prepped and draped in the usual sterile fashion by the EP lab staff.  Using a percutaneous Seldinger technique, two 7-French and one 11-French hemostasis sheaths were placed into the right common femoral vein.  An esophageal temperature probe was inserted to monitor for heating of the esophagus during the procedure. Direct ultrasound guidance is used for right and left femoral veins with normal vessel patency. Ultrasound images are captured and stored in the patient's chart. Using ultrasound guidance, the Brockenbrough needle and wire were visualized entering the vessel. Catheter Placement:  A 7-French [employer] Decapolar coronary sinus catheter was introduced through the right common femoral vein and advanced into the coronary sinus for recording and pacing from this location.    Initial Measurements: The patient presented to the electrophysiology lab in sinus rhythm.   The average RR interval measured 941 msec.   Intracardiac Echocardiography: A 10-French Bios[employer] Webster AcuNav intracardiac echocardiography catheter was introduced through the right common femoral vein and advanced into the right atrium. Intracardiac echocardiography was performed of the left atrium, and a three-dimensional anatomical rendering of the left atrium was performed using CARTO sound technology.  The patient was noted to have a moderate sized left atrium.  The interatrial septum was prominent but not aneurysmal. All 4 pulmonary veins were visualized and noted to have separate ostia.  The pulmonary veins were moderate in size.  The left atrial appendage was visualized and did not reveal thrombus.   There was no evidence of pulmonary vein stenosis. Transseptal Puncture: The right common femoral vein  sheaths were was exchanged for an 8.5 [place] Agillis transseptal sheath and transseptal access was achieved in a standard fashion using a Brockenbrough needle under biplane fluoroscopy with intracardiac echocardiography confirmation of the transseptal puncture.  Once transseptal access had been achieved, heparin was administered intravenously and intra- arterially in order to maintain an ACT of greater than 350 seconds throughout the procedure.  3D Mapping and Ablation: A 3.5 mm [employer] Thermocool ablation catheter was advanced into the right atrium.  The transseptal sheath was pulled back into the IVC over a guidewire.  The ablation catheter was advanced across the transseptal hole using the wire as a guide.  The transseptal sheath was then re-advanced over the guidewire into the left atrium.  A duodecapolar Biosense Webster pentarray mapping catheter was introduced through the transseptal sheath and positioned over the mouth of all 4 pulmonary veins.  Three-dimensional electroanatomical mapping was performed using CARTO technology.  This demonstrated electrical activity within all four pulmonary veins at baseline. The patient underwent successful sequential electrical isolation and anatomical encircling of all four pulmonary veins using radiofrequency current with a circular mapping catheter as a guide. A WACA approach was used.  During ablation, the patient went into atrial fibrillation.  Cardioversion initially at 150 J synchronized was attempted, though the patient  did not go back into normal rhythm.  Cardioversion was repeated multiple times, though the patient would not remain in normal rhythm.  The patient was given 1 g of magnesium followed by 10 mg of ibutilide.  Prior to initiation of ibutilide, the patient's QT interval was 423 ms.  The patient was cardioverted after the ibutilide and remained in sinus rhythm from their own. Due to persistence of atrial fibrillation, additional left  atrial mapping and ablation was performed.  A series of radiofrequency lesions were delivered along the roof and floor of the left atrium in order to create a "standard box" lesion along the posterior wall of the left atrium.  12 mg of adenosine was given to check the left pulmonary veins which revealed continued conduction.  Further ablation was performed in the left pulmonary veins. Measurements Following Ablation: In sinus rhythm the RR interval was 873 msec, with PR 143 msec, QRS 121 msec, and QT 516 msec.  Following ablation the AH interval measured 107 msec with an HV interval of 49 msec. Ventricular pacing was performed, which revealed VA dissociation at 600 msec. Rapid atrial pacing was performed, which revealed an AV Wenckebach cycle length of 430 msec.  Electroisolation was then again confirmed in all four pulmonary veins. The procedure was therefore considered completed.  All catheters were removed, and the sheaths were aspirated and flushed.  The patient was transferred to the recovery area for sheath removal per protocol.  Intracardiac echocardiogram revealed no pericardial effusion. EBL<37ml.  There were no early apparent complications. CONCLUSIONS: 1. Atrial fibrillation upon presentation.  2. Successful electrical isolation and anatomical encircling of all four pulmonary veins with radiofrequency current.  A WACA approach was used 3. Additional left atrial ablation was performed with a standard box lesion created along the posterior wall of the left atrium 4. Atrial fibrillation successfully cardioverted to sinus rhythm. 5. No early apparent complications. Derrick Cuadra Martin Derrick Dawson,MD 11:04 AM 09/17/2020   CT CARDIAC MORPH/PULM VEIN W/CM&W/O CA SCORE  Addendum Date: 09/12/2020   ADDENDUM REPORT: 09/12/2020 21:59 CLINICAL DATA:  Atrial fibrillation scheduled for ablation. EXAM: Cardiac CTA TECHNIQUE: A non-contrast, gated CT scan was obtained with axial slices of 3 mm through the heart for calcium  scoring. Calcium scoring was performed using the Agatston method. A 120 kV retrospective, gated, contrast cardiac scan was obtained. Gantry rotation speed was 250 msecs and collimation was 0.6 mm. Nitroglycerin was not given. A delayed scan was obtained to exclude left atrial appendage thrombus. The 3D dataset was reconstructed in 5% intervals of the 0-95% of the R-R cycle. Late systolic phases were analyzed on a dedicated workstation using MPR, MIP, and VRT modes. The patient received 100 cc of contrast. FINDINGS: Image quality: Average. Noise artifact is: Moderate due to poor contrast timing. Pulmonary Veins: There is normal pulmonary vein drainage into the left atrium (2 on the right and 2 on the left) with ostial measurements as follows: RUPV: Ostium 22 mm x 19 mm  area 334 mm2 RLPV:  Ostium 17.5 mm x 15 mm area 194 mm2 LUPV:  Ostium 17 mm x 15 mm area 188 mm2 LLPV:  Ostium 19.5 mm x 16 mm  area 242 mm2 Left Atrium: The left atrial size is normal. There is no PFO/ASD. The left atrial appendage is large broccoli type with two lobes. There is no thrombus in the left atrial appendage on contrast or delayed imaging. The esophagus runs in close proximity to the right inferior pulmonary vein ostium. Coronary Arteries:  CAC score of 361, which is 58th percentile for age-, race-, and sex-matched controls. Normal coronary origin. Right dominance. The study was performed without use of NTG and is insufficient for plaque evaluation. Right Atrium: Right atrial size is within normal limits. Right Ventricle: The right ventricular cavity is within normal limits. Left Ventricle: The ventricular cavity size is within normal limits. Pericardium: Normal thickness with no significant effusion or calcium present. Pulmonary Artery: Normal caliber without proximal filling defect. Cardiac valves: The aortic valve is trileaflet with mild to moderate calcification. The mitral valve is normal structure with mild posterior mitral annular  calcification. Aorta: Normal caliber with no significant disease. Extra-cardiac findings: See attached radiology report for non-cardiac structures. IMPRESSION: 1. Poor quality study due to poor contrast timing. 2. There is normal pulmonary vein drainage into the left atrium with ostial measurements above. 3. There is no thrombus in the left atrial appendage. 4. The esophagus runs in close proximity to the right inferior pulmonary vein ostium. 5. No PFO/ASD. 6. Normal coronary origin. Right dominance. 7. CAC score of 361 which is 58th percentile for age-, race-, and sex-matched controls. Eleonore Chiquito, MD Electronically Signed   By: Eleonore Chiquito   On: 09/12/2020 21:59   Result Date: 09/12/2020 EXAM: OVER-READ INTERPRETATION  CT CHEST The following report is an over-read performed by radiologist Dr. Vinnie Langton of [facility], [place] on 09/10/2020. This over-read does not include interpretation of cardiac or coronary anatomy or pathology. The coronary calcium score/coronary CTA interpretation by the cardiologist is attached. COMPARISON:  None. FINDINGS: Aortic atherosclerosis. Within the visualized portions of the thorax there are no suspicious appearing pulmonary nodules or masses, there is no acute consolidative airspace disease, no pleural effusions, no pneumothorax and no lymphadenopathy. Visualized portions of the upper abdomen demonstrates diffuse low attenuation throughout the visualized hepatic parenchyma. There are no aggressive appearing lytic or blastic lesions noted in the visualized portions of the skeleton. Chronic appearing mid to lower thoracic vertebral body compression fracture with approximately 40% loss of anterior vertebral body height. IMPRESSION: 1.  Aortic Atherosclerosis (ICD10-I70.0). 2. Hepatic steatosis. Electronically Signed: By: Vinnie Langton M.D. On: 09/10/2020 08:37   Disposition   Pt is being discharged home today in good condition.  Follow-up Plans & Appointments      Follow-up Information     [facility] Follow up.   Specialty: Cardiology Why: 10/19/20 @ 9:00AM with R. Marlene Lard, PA-C Contact information: [address] [id number] mc [address] [place] [address] [phone]        Constance Haw, MD Follow up.   Specialty: Cardiology Why: 12/20/20 @ 10:45AM  Contact information: [place] [address] [phone]                Discharge Instructions     Diet - low sodium heart healthy   Complete by: As directed    Increase activity slowly   Complete by: As directed    Other Restrictions   Complete by: As directed    Please see detailed instructions on AVS       Discharge Medications   Allergies as of 09/18/2020       Reactions   Amiodarone Nausea Only   Nausea, abdominal pain, SOB, dizziness   Diltiazem Shortness Of Breath   Flecainide Shortness Of Breath        Medication List     TAKE these medications    Ester-C Tabs Take 1 tablet by  mouth daily.   famotidine 20 MG tablet Commonly known as: PEPCID Take 20 mg by mouth at bedtime.   furosemide 40 MG tablet Commonly known as: LASIX TAKE 1 AND 1/2 TABLETS(60 MG) BY MOUTH TWICE DAILY What changed: See the new instructions.   irbesartan 300 MG tablet Commonly known as: AVAPRO TAKE 1 TABLET(300 MG) BY MOUTH DAILY What changed: See the new instructions.   latanoprost 0.005 % ophthalmic solution Commonly known as: XALATAN Place 1 drop into both eyes at bedtime.   rivaroxaban 20 MG Tabs tablet Commonly known as: Xarelto Take 1 tablet (20 mg total) by mouth daily with supper. LABS NEEDED FOR FURTHER REFILLS   rosuvastatin 20 MG tablet Commonly known as: CRESTOR Take 20 mg by mouth daily.   sodium chloride 0.65 % Soln nasal spray Commonly known as: OCEAN Place 1 spray into both nostrils as needed for congestion.   spironolactone 25 MG tablet Commonly known as: ALDACTONE Take  1 tablet (25 mg total) by mouth daily.           Outstanding Labs/Studies      Duration of Discharge Encounter   Greater than 30 minutes including physician time.  Signed, Richardson Dopp, PA-C 09/18/2020, 10:31 AM   I have seen and examined this patient with Richardson Dopp.  Agree with above, note added to reflect my findings.  On exam, RRR, no mumurs. Post AF ablation. Discharge today.    Chianna Spirito M. Kishan Wachsmuth MD 09/18/2020 11:47 AM

## 2020-09-18 NOTE — Plan of Care (Signed)
  Problem: Education: Goal: Knowledge of disease or condition will improve Outcome: Completed/Met Goal: Understanding of medication regimen will improve Outcome: Completed/Met Goal: Individualized Educational Video(s) Outcome: Completed/Met   Problem: Activity: Goal: Ability to tolerate increased activity will improve Outcome: Completed/Met   Problem: Cardiac: Goal: Ability to achieve and maintain adequate cardiopulmonary perfusion will improve Outcome: Completed/Met   Problem: Health Behavior/Discharge Planning: Goal: Ability to safely manage health-related needs after discharge will improve Outcome: Completed/Met   Problem: Education: Goal: Knowledge of General Education information will improve Description: Including pain rating scale, medication(s)/side effects and non-pharmacologic comfort measures Outcome: Completed/Met   Problem: Health Behavior/Discharge Planning: Goal: Ability to manage health-related needs will improve Outcome: Completed/Met   Problem: Clinical Measurements: Goal: Ability to maintain clinical measurements within normal limits will improve Outcome: Completed/Met Goal: Will remain free from infection Outcome: Completed/Met Goal: Diagnostic test results will improve Outcome: Completed/Met Goal: Respiratory complications will improve Outcome: Completed/Met Goal: Cardiovascular complication will be avoided Outcome: Completed/Met   Problem: Activity: Goal: Risk for activity intolerance will decrease Outcome: Completed/Met   Problem: Nutrition: Goal: Adequate nutrition will be maintained Outcome: Completed/Met   Problem: Coping: Goal: Level of anxiety will decrease Outcome: Completed/Met   Problem: Elimination: Goal: Will not experience complications related to bowel motility Outcome: Completed/Met Goal: Will not experience complications related to urinary retention Outcome: Completed/Met   Problem: Pain Managment: Goal: General  experience of comfort will improve Outcome: Completed/Met   Problem: Safety: Goal: Ability to remain free from injury will improve Outcome: Completed/Met   Problem: Skin Integrity: Goal: Risk for impaired skin integrity will decrease Outcome: Completed/Met   

## 2020-09-18 NOTE — Progress Notes (Signed)
Pt got discharged to home, discharge instructions provided and patient showed understanding to it, IV taken out,Telemonitor DC,pt left unit in wheelchair with all of the belongings accompanied with a family member (wife)  Aleka Twitty,RN 

## 2020-09-18 NOTE — Progress Notes (Signed)
Progress Note  Patient Name: Derrick Alexander Date of Encounter: 09/18/2020  [facility] Cardiologist: Pixie Casino, MD   Subjective   Currently feeling well.  No chest pain or shortness of breath.  Has remained in sinus rhythm.  Inpatient Medications    Scheduled Meds: . vitamin C  500 mg Oral Daily  . famotidine  20 mg Oral QHS  . latanoprost  1 drop Both Eyes QHS  . rivaroxaban  20 mg Oral Q supper  . rosuvastatin  20 mg Oral Daily  . sodium chloride flush  3 mL Intravenous Q12H  . spironolactone  25 mg Oral Daily   Continuous Infusions: . sodium chloride     PRN Meds: sodium chloride, acetaminophen, hydrALAZINE, ondansetron (ZOFRAN) IV, sodium chloride, sodium chloride flush   Vital Signs    Vitals:   09/17/20 2228 09/17/20 2300 09/18/20 0300 09/18/20 0742  BP:  119/76 135/86 (!) 132/97  Pulse: 92 81 72 78  Resp: 16 18 16 16   Temp:  98.6 F (37 C) 98.5 F (36.9 C) 98.5 F (36.9 C)  TempSrc:  Oral Oral Oral  SpO2: 96% 96% 98% 100%  Weight:      Height:        Intake/Output Summary (Last 24 hours) at 09/18/2020 0803 Last data filed at 09/18/2020 0400 Gross per 24 hour  Intake 1400 ml  Output 1150 ml  Net 250 ml   Last 3 Weights 09/17/2020 08/11/2020 07/30/2020  Weight (lbs) 255 lb 258 lb 3.2 oz 259 lb 6.4 oz  Weight (kg) 115.667 kg 117.119 kg 117.663 kg      Telemetry    Sinus rhythm- Personally Reviewed  ECG    Sinus rhythm- Personally Reviewed  Physical Exam   GEN: No acute distress.   Neck: No JVD Cardiac: RRR, no murmurs, rubs, or gallops.  Respiratory: Clear to auscultation bilaterally. GI: Soft, nontender, non-distended  MS: No edema; No deformity. Neuro:  Nonfocal  Psych: Normal affect   Labs    High Sensitivity Troponin:  No results for input(s): TROPONINIHS in the last 720 hours.    ChemistryNo results for input(s): NA, K, CL, CO2, GLUCOSE, BUN, CREATININE, CALCIUM, PROT, ALBUMIN, AST, ALT, ALKPHOS, BILITOT, GFRNONAA, GFRAA,  ANIONGAP in the last 168 hours.   HematologyNo results for input(s): WBC, RBC, HGB, HCT, MCV, MCH, MCHC, RDW, PLT in the last 168 hours.  BNPNo results for input(s): BNP, PROBNP in the last 168 hours.   DDimer No results for input(s): DDIMER in the last 168 hours.   Radiology    EP STUDY  Result Date: 09/17/2020 SURGEON:  Allegra Lai, MD PREPROCEDURE DIAGNOSES: 1. Persistent atrial fibrillation. POSTPROCEDURE DIAGNOSES: 1. Persistent atrial fibrillation. PROCEDURES: 1. Comprehensive electrophysiologic study. 2. Coronary sinus pacing and recording. 3. Three-dimensional mapping of atrial fibrillation (with additional mapping and ablation within the left atrium due to persistence of afib) 4. Ablation of atrial fibrillation (with additional mapping and ablation within the left atrium due to persistence of afib) 5. Intracardiac echocardiography. 6. Transseptal puncture of an intact septum. 7. Arrhythmia induction with pacing 8. External cardioversion. INTRODUCTION:  Derrick Alexander is a 75 y.o. male with a history of persistent atrial fibrillation who now presents for EP study and radiofrequency ablation.  The patient reports initially being diagnosed with atrial fibrillation after presenting with symptomatic palpitations and fatgiue.  The patient has failed medical therapy.  The patient therefore presents today for catheter ablation of atrial fibrillation. DESCRIPTION OF PROCEDURE:  Informed written consent  was obtained, and the patient was brought to the electrophysiology lab in a fasting state.  The patient was adequately sedated with intravenous medications as outlined in the anesthesia report.  The patient's left and right groins were prepped and draped in the usual sterile fashion by the EP lab staff.  Using a percutaneous Seldinger technique, two 7-French and one 11-French hemostasis sheaths were placed into the right common femoral vein.  An esophageal temperature probe was inserted to monitor for  heating of the esophagus during the procedure. Direct ultrasound guidance is used for right and left femoral veins with normal vessel patency. Ultrasound images are captured and stored in the patient's chart. Using ultrasound guidance, the Brockenbrough needle and wire were visualized entering the vessel. Catheter Placement:  A 7-French Biosense Webster Decapolar coronary sinus catheter was introduced through the right common femoral vein and advanced into the coronary sinus for recording and pacing from this location.    Initial Measurements: The patient presented to the electrophysiology lab in sinus rhythm.   The average RR interval measured 941 msec.   Intracardiac Echocardiography: A 10-French Biosense Webster AcuNav intracardiac echocardiography catheter was introduced through the right common femoral vein and advanced into the right atrium. Intracardiac echocardiography was performed of the left atrium, and a three-dimensional anatomical rendering of the left atrium was performed using CARTO sound technology.  The patient was noted to have a moderate sized left atrium.  The interatrial septum was prominent but not aneurysmal. All 4 pulmonary veins were visualized and noted to have separate ostia.  The pulmonary veins were moderate in size.  The left atrial appendage was visualized and did not reveal thrombus.   There was no evidence of pulmonary vein stenosis. Transseptal Puncture: The right common femoral vein sheaths were was exchanged for an 8.5 [place] Agillis transseptal sheath and transseptal access was achieved in a standard fashion using a Brockenbrough needle under biplane fluoroscopy with intracardiac echocardiography confirmation of the transseptal puncture.  Once transseptal access had been achieved, heparin was administered intravenously and intra- arterially in order to maintain an ACT of greater than 350 seconds throughout the procedure.  3D Mapping and Ablation: A 3.5 mm Biosense International Paper Thermocool ablation catheter was advanced into the right atrium.  The transseptal sheath was pulled back into the IVC over a guidewire.  The ablation catheter was advanced across the transseptal hole using the wire as a guide.  The transseptal sheath was then re-advanced over the guidewire into the left atrium.  A duodecapolar Biosense Webster pentarray mapping catheter was introduced through the transseptal sheath and positioned over the mouth of all 4 pulmonary veins.  Three-dimensional electroanatomical mapping was performed using CARTO technology.  This demonstrated electrical activity within all four pulmonary veins at baseline. The patient underwent successful sequential electrical isolation and anatomical encircling of all four pulmonary veins using radiofrequency current with a circular mapping catheter as a guide. A WACA approach was used.  During ablation, the patient went into atrial fibrillation.  Cardioversion initially at 150 J synchronized was attempted, though the patient did not go back into normal rhythm.  Cardioversion was repeated multiple times, though the patient would not remain in normal rhythm.  The patient was given 1 g of magnesium followed by 10 mg of ibutilide.  Prior to initiation of ibutilide, the patient's QT interval was 423 ms.  The patient was cardioverted after the ibutilide and remained in sinus rhythm from their own. Due to persistence of atrial fibrillation,  additional left atrial mapping and ablation was performed.  A series of radiofrequency lesions were delivered along the roof and floor of the left atrium in order to create a "standard box" lesion along the posterior wall of the left atrium.  12 mg of adenosine was given to check the left pulmonary veins which revealed continued conduction.  Further ablation was performed in the left pulmonary veins. Measurements Following Ablation: In sinus rhythm the RR interval was 873 msec, with PR 143 msec, QRS 121 msec, and QT  516 msec.  Following ablation the AH interval measured 107 msec with an HV interval of 49 msec. Ventricular pacing was performed, which revealed VA dissociation at 600 msec. Rapid atrial pacing was performed, which revealed an AV Wenckebach cycle length of 430 msec.  Electroisolation was then again confirmed in all four pulmonary veins. The procedure was therefore considered completed.  All catheters were removed, and the sheaths were aspirated and flushed.  The patient was transferred to the recovery area for sheath removal per protocol.  Intracardiac echocardiogram revealed no pericardial effusion. EBL<19ml.  There were no early apparent complications. CONCLUSIONS: 1. Atrial fibrillation upon presentation.  2. Successful electrical isolation and anatomical encircling of all four pulmonary veins with radiofrequency current.  A WACA approach was used 3. Additional left atrial ablation was performed with a standard box lesion created along the posterior wall of the left atrium 4. Atrial fibrillation successfully cardioverted to sinus rhythm. 5. No early apparent complications. Danyele Smejkal Martin Emberlie Gotcher,MD 11:04 AM 09/17/2020    Cardiac Studies   TTE 2019 - Left ventricle: The cavity size was normal. Wall thickness was  normal. Systolic function was normal. The estimated ejection  fraction was in the range of 60% to 65%. Wall motion was normal;  there were no regional wall motion abnormalities. Doppler  parameters are consistent with abnormal left ventricular  relaxation (grade 1 diastolic dysfunction).   Patient Profile     75 y.o. male status post atrial fibrillation ablation yesterday kept overnight due to administration of ibutilide during ablation procedure.  Assessment & Plan    1.  Persistent atrial fibrillation: Status post ablation yesterday.  Groin sites without issue.  ECG shows sinus rhythm with an acceptable QTC.  Okay for discharge today on prior home medications.  For questions  or updates, please contact [place] Please consult [email or web address] for contact info under        Signed, Larenzo Caples [name], MD  09/18/2020, 8:03 AM

## 2020-09-20 ENCOUNTER — Encounter (HOSPITAL_COMMUNITY): Payer: Self-pay | Admitting: Cardiology

## 2020-09-21 DIAGNOSIS — G4733 Obstructive sleep apnea (adult) (pediatric): Secondary | ICD-10-CM | POA: Diagnosis not present

## 2020-10-06 ENCOUNTER — Other Ambulatory Visit: Payer: Self-pay | Admitting: Internal Medicine

## 2020-10-06 NOTE — Telephone Encounter (Signed)
49m, 117.7kg, scr 1.02 09/08/20, lovw/camnitz 08/17/20, ccr 102.6

## 2020-10-19 ENCOUNTER — Encounter (HOSPITAL_COMMUNITY): Payer: Self-pay | Admitting: Physician Assistant

## 2020-10-19 ENCOUNTER — Ambulatory Visit (HOSPITAL_COMMUNITY)
Admission: RE | Admit: 2020-10-19 | Discharge: 2020-10-19 | Disposition: A | Discharge status: Home / Self Care | Payer: Medicare Other | Source: Ambulatory Visit | Attending: Physician Assistant | Admitting: Physician Assistant

## 2020-10-19 ENCOUNTER — Other Ambulatory Visit: Payer: Self-pay

## 2020-10-19 VITALS — BP 136/60 | HR 77 | Ht 65.0 in | Wt 250.6 lb

## 2020-10-19 DIAGNOSIS — E669 Obesity, unspecified: Secondary | ICD-10-CM | POA: Insufficient documentation

## 2020-10-19 DIAGNOSIS — Z6841 Body Mass Index (BMI) 40.0 and over, adult: Secondary | ICD-10-CM | POA: Insufficient documentation

## 2020-10-19 DIAGNOSIS — I10 Essential (primary) hypertension: Secondary | ICD-10-CM | POA: Insufficient documentation

## 2020-10-19 DIAGNOSIS — G4733 Obstructive sleep apnea (adult) (pediatric): Secondary | ICD-10-CM | POA: Insufficient documentation

## 2020-10-19 DIAGNOSIS — I480 Paroxysmal atrial fibrillation: Secondary | ICD-10-CM

## 2020-10-19 DIAGNOSIS — D6869 Other thrombophilia: Secondary | ICD-10-CM | POA: Diagnosis not present

## 2020-10-19 DIAGNOSIS — Z7901 Long term (current) use of anticoagulants: Secondary | ICD-10-CM | POA: Insufficient documentation

## 2020-10-19 DIAGNOSIS — I4510 Unspecified right bundle-branch block: Secondary | ICD-10-CM | POA: Diagnosis not present

## 2020-10-19 DIAGNOSIS — Z87891 Personal history of nicotine dependence: Secondary | ICD-10-CM | POA: Diagnosis not present

## 2020-10-19 NOTE — Progress Notes (Signed)
Primary Care Physician: [name], MD Primary Cardiologist: Dr Debara Pickett Primary Electrophysiologist: Dr Curt Bears Referring Physician: Dr Reino Bellis Derrick Alexander is a 75 y.o. male with a history of HTN, tobacco abuse, NSVT, and paroxysmal atrial fibrillation who presents for follow up in the [facility]. Patient was noted to be in afib at his routine follow up with Dr Curt Bears on 03/11/19. Patient had just had a colonoscopy prior to the visit and his Xarelto had been held for the procedure. After 3 weeks of uninterrupted anticoagulation, he was started on flecainide with a plan to cardiovert. Patient called [facility] 04/02/19 with symptoms of dizziness and SOB after 2 doses of the flecainide. His reported HR was 42. He is on Xarelto for a CHADS2VASC score of 4. He was in a junctional rhythm and flecainide was stopped. He had recurrent afib and was started on amiodarone. Unfortunately, he developed GI side effects and this was also discontinued.   On follow up today, patient is s/p afib ablation with Dr Curt Bears on 09/18/20. During the procedure, the patient went into atrial fibrillation.  He was cardioverted several times and ultimately given Ibutilide.  He returned to normal sinus rhythm with this.  He was monitored overnight due to administration of ibutilide. He has done well since then with no heart racing or palpitations. He denies CP, swallowing pain, or groin issues.   Today, he denies symptoms of palpitations, chest pain, orthopnea, PND, lower extremity edema, presyncope, syncope, bleeding, or neurologic sequela. The patient is tolerating medications without difficulties and is otherwise without complaint today.    Atrial Fibrillation Risk Factors:  he does have symptoms or diagnosis of sleep apnea. he is compliant with CPAP therapy. he does not have a history of rheumatic fever. he does not have a history of alcohol use. The patient does not have a history of  early familial atrial fibrillation or other arrhythmias.  he has a BMI of Body mass index is 41.7 kg/m.Marland Kitchen Filed Weights   10/19/20 0901  Weight: 113.7 kg    Family History  Problem Relation Age of Onset  . Heart disease Brother   . COPD Brother   . Diabetes Mother      Atrial Fibrillation Management history:  Previous antiarrhythmic drugs: flecainide, amiodarone  Previous cardioversions: none Previous ablations: 09/18/20 CHADS2VASC score: 4 Anticoagulation history: Xarelto   Past Medical History:  Diagnosis Date  . A-fib ([place])   . Abnormal EKG 06/18/2015  . COPD (chronic obstructive pulmonary disease) ([place])   . COPD with exacerbation ([place])   . Faintness 06/18/2015  . Hypertension   . LBBB (left bundle branch block) 06/18/2015  . Obesity   . Syncope 08/06/2015  . Tobacco use   . V-tach [facility]) 08/06/2015   Past Surgical History:  Procedure Laterality Date  . ATRIAL FIBRILLATION ABLATION N/A 09/17/2020   Procedure: ATRIAL FIBRILLATION ABLATION;  Surgeon: Constance Haw, MD;  Location: [place] [facility];  Service: Cardiovascular;  Laterality: N/A;  . COLONOSCOPY WITH PROPOFOL N/A 03/07/2019   Procedure: COLONOSCOPY WITH PROPOFOL;  Surgeon: Carol Ada, MD;  Location: [facility];  Service: Endoscopy;  Laterality: N/A;  . POLYPECTOMY  03/07/2019   Procedure: POLYPECTOMY;  Surgeon: Carol Ada, MD;  Location: [facility];  Service: Endoscopy;;    Current Outpatient Medications  Medication Sig Dispense Refill  . Bioflavonoid Products (ESTER-C) TABS Take 1 tablet by mouth daily.    . famotidine (PEPCID) 20 MG tablet Take 20 mg by  mouth at bedtime.     . furosemide (LASIX) 40 MG tablet TAKE 1 AND 1/2 TABLETS(60 MG) BY MOUTH TWICE DAILY 270 tablet 3  . guaiFENesin (MUCINEX) 600 MG 12 hr tablet Take by mouth 2 (two) times daily.    . irbesartan (AVAPRO) 300 MG tablet TAKE 1 TABLET(300 MG) BY MOUTH DAILY 90 tablet 3  . latanoprost (XALATAN) 0.005 % ophthalmic solution  Place 1 drop into both eyes at bedtime.    . rivaroxaban (XARELTO) 20 MG TABS tablet Take 1 tablet (20 mg total) by mouth daily with supper. 90 tablet 1  . rosuvastatin (CRESTOR) 20 MG tablet Take 20 mg by mouth daily.    . sodium chloride (OCEAN) 0.65 % SOLN nasal spray Place 1 spray into both nostrils as needed for congestion.    Marland Kitchen spironolactone (ALDACTONE) 25 MG tablet Take 1 tablet (25 mg total) by mouth daily. 90 tablet 3   No current facility-administered medications for this encounter.    Allergies  Allergen Reactions  . Amiodarone Nausea Only    Nausea, abdominal pain, SOB, dizziness  . Diltiazem Shortness Of Breath  . Flecainide Shortness Of Breath    Social History   Socioeconomic History  . Marital status: Married    Spouse name: Not on file  . Number of children: Not on file  . Years of education: Not on file  . Highest education level: Not on file  Occupational History  . Not on file  Tobacco Use  . Smoking status: Former Smoker    Packs/day: 2.00    Years: 60.00    Pack years: 120.00    Types: Cigarettes    Quit date: 06/13/2017    Years since quitting: 3.3  . Smokeless tobacco: Never Used  Vaping Use  . Vaping Use: Never used  Substance and Sexual Activity  . Alcohol use: No    Alcohol/week: 0.0 standard drinks  . Drug use: No  . Sexual activity: Not on file  Other Topics Concern  . Not on file  Social History Narrative  . Not on file   Social Determinants of Health   Financial Resource Strain: Not on file  Food Insecurity: Not on file  Transportation Needs: Not on file  Physical Activity: Not on file  Stress: Not on file  Social Connections: Not on file  Intimate Partner Violence: Not on file     ROS- All systems are reviewed and negative except as per the HPI above.  Physical Exam: Vitals:   10/19/20 0901  BP: 136/60  Pulse: 77  Weight: 113.7 kg  Height: 5\' 5"  (1.651 m)    GEN- The patient is a well appearing obese elderly male,  alert and oriented x 3 today.   HEENT-head normocephalic, atraumatic, sclera clear, conjunctiva pink, hearing intact, trachea midline. Lungs- Clear to ausculation bilaterally, normal work of breathing Heart- Regular rate and rhythm, no rubs or gallops. 2/6 systolic murmur  GI- soft, NT, ND, + BS Extremities- no clubbing, cyanosis, or edema MS- no significant deformity or atrophy Skin- no rash or lesion Psych- euthymic mood, full affect Neuro- strength and sensation are intact   Wt Readings from Last 3 Encounters:  10/19/20 113.7 kg  09/17/20 115.7 kg  08/11/20 117.1 kg    EKG today demonstrates  SR, RBBB, LAFB Vent. rate 77 BPM PR interval 198 ms QRS duration 118 ms QT/QTcB 436/493 ms  Echo 04/29/2018 demonstrated  - Left ventricle: The cavity size was normal. Wall thickness was  normal. Systolic function was normal. The estimated ejection   fraction was in the range of 60% to 65%. Wall motion was normal;   there were no regional wall motion abnormalities. Doppler   parameters are consistent with abnormal left ventricular   relaxation (grade 1 diastolic dysfunction).  Epic records are reviewed at length today  Assessment and Plan:  1. Paroxysmal atrial fibrillation Recall patient has failed amiodarone and flecainide S/p afib ablation 09/18/20 He appears to be maintaining SR. Continue Xarelto 20 mg daily for at least 3 months post ablation with no missed doses.   This patients CHA2DS2-VASc Score and unadjusted Ischemic Stroke Rate (% per year) is equal to 4.8 % stroke rate/year from a score of 4  Above score calculated as 1 point each if present [CHF, HTN, DM, Vascular=MI/PAD/Aortic Plaque, Age if 65-74, or Male] Above score calculated as 2 points each if present [Age > 75, or Stroke/TIA/TE]  2. Obesity Body mass index is 41.7 kg/m. Lifestyle modification was discussed and encouraged including regular physical activity and weight reduction.  3. OSA Patient  reports compliance with CPAP therapy.  4. HTN Stable, no changes today.   Follow up with Dr Curt Bears as scheduled.    [facility] [address] [place], [place] [address] [phone] 10/19/2020 9:07 AM

## 2020-11-01 ENCOUNTER — Ambulatory Visit: Payer: Medicare Other | Admitting: Cardiology

## 2020-11-30 DIAGNOSIS — J340 Abscess, furuncle and carbuncle of nose: Secondary | ICD-10-CM | POA: Diagnosis not present

## 2020-12-07 DIAGNOSIS — G4733 Obstructive sleep apnea (adult) (pediatric): Secondary | ICD-10-CM | POA: Diagnosis not present

## 2020-12-20 ENCOUNTER — Ambulatory Visit: Payer: Medicare Other | Admitting: Cardiology

## 2020-12-20 ENCOUNTER — Other Ambulatory Visit: Payer: Self-pay

## 2020-12-20 ENCOUNTER — Encounter: Payer: Self-pay | Admitting: Cardiology

## 2020-12-20 VITALS — BP 116/78 | HR 80 | Ht 66.0 in | Wt 249.0 lb

## 2020-12-20 DIAGNOSIS — I4819 Other persistent atrial fibrillation: Secondary | ICD-10-CM | POA: Diagnosis not present

## 2020-12-20 NOTE — Patient Instructions (Signed)
Medication Instructions:  Your physician recommends that you continue on your current medications as directed. Please refer to the Current Medication list given to you today.  *If you need a refill on your cardiac medications before your next appointment, please call your pharmacy*   Lab Work: None ordered   Testing/Procedures: None ordered   Follow-Up: At [facility], you and your health needs are our priority.  As part of our continuing mission to provide you with exceptional heart care, we have created designated Provider Care Teams.  These Care Teams include your primary Cardiologist (physician) and Advanced Practice Providers (APPs -  Physician Assistants and Nurse Practitioners) who all work together to provide you with the care you need, when you need it.  Your next appointment:   3 month(s)  The format for your next appointment:   In Person  Provider:   Will Camnitz, MD    Thank you for choosing [facility]!!   Alera Quevedo, RN [phone]     

## 2020-12-20 NOTE — Progress Notes (Signed)
Electrophysiology Office Note   Date:  12/20/2020   ID:  Derrick Alexander, DOB 1946-05-12, MRN [id number]  PCP:  [name], MD  Cardiologist:  Debara Pickett Primary Electrophysiologist:  Trenice Mesa [name], MD    No chief complaint on file.    History of Present Illness: Derrick Alexander is a 75 y.o. male who presents today for electrophysiology evaluation.    He has a history significant for hypertension, left bundle branch block, tobacco abuse with 60-pack-year history, nonsustained VT, and persistent atrial fibrillation.  He had an episode of syncope.  He wore a cardiac monitor that showed a 16 beat run of nonsustained VT.  He was put on flecainide for his runs of VT, but developed bradycardia.  Flecainide was stopped.  He has had no further episodes of syncope.  He is status post A. fib ablation 09/14/2020.  Today, denies symptoms of palpitations, chest pain, shortness of breath, orthopnea, PND, lower extremity edema, claudication, dizziness, presyncope, syncope, bleeding, or neurologic sequela. The patient is tolerating medications without difficulties.  Since his ablation he has done well.  He is noted no further episodes of atrial fibrillation.  He is able to do all of his daily activities without issue.  He has had an improvement in his shortness of breath and fatigue since his ablation.   Past Medical History:  Diagnosis Date   A-fib [facility])    Abnormal EKG 06/18/2015   COPD (chronic obstructive pulmonary disease) (HCC)    COPD with exacerbation ([place])    Faintness 06/18/2015   Hypertension    LBBB (left bundle branch block) 06/18/2015   Obesity    Syncope 08/06/2015   Tobacco use    V-tach ([place]) 08/06/2015   Past Surgical History:  Procedure Laterality Date   ATRIAL FIBRILLATION ABLATION N/A 09/17/2020   Procedure: ATRIAL FIBRILLATION ABLATION;  Surgeon: Constance Haw, MD;  Location: [place] [facility];  Service: Cardiovascular;  Laterality: N/A;   COLONOSCOPY WITH PROPOFOL N/A  03/07/2019   Procedure: COLONOSCOPY WITH PROPOFOL;  Surgeon: Carol Ada, MD;  Location: [facility];  Service: Endoscopy;  Laterality: N/A;   POLYPECTOMY  03/07/2019   Procedure: POLYPECTOMY;  Surgeon: Carol Ada, MD;  Location: [facility];  Service: Endoscopy;;     Current Outpatient Medications  Medication Sig Dispense Refill   Bioflavonoid Products (ESTER-C) TABS Take 1 tablet by mouth daily.     famotidine (PEPCID) 20 MG tablet Take 20 mg by mouth at bedtime.      furosemide (LASIX) 40 MG tablet TAKE 1 AND 1/2 TABLETS(60 MG) BY MOUTH TWICE DAILY 270 tablet 3   guaiFENesin (MUCINEX) 600 MG 12 hr tablet Take by mouth 2 (two) times daily.     irbesartan (AVAPRO) 300 MG tablet TAKE 1 TABLET(300 MG) BY MOUTH DAILY 90 tablet 3   latanoprost (XALATAN) 0.005 % ophthalmic solution Place 1 drop into both eyes at bedtime.     rivaroxaban (XARELTO) 20 MG TABS tablet Take 1 tablet (20 mg total) by mouth daily with supper. 90 tablet 1   rosuvastatin (CRESTOR) 20 MG tablet Take 20 mg by mouth daily.     sodium chloride (OCEAN) 0.65 % SOLN nasal spray Place 1 spray into both nostrils as needed for congestion.     spironolactone (ALDACTONE) 25 MG tablet Take 1 tablet (25 mg total) by mouth daily. 90 tablet 3   No current facility-administered medications for this visit.    Allergies:   Amiodarone, Diltiazem, and Flecainide   Social History:  The patient  reports that he quit smoking about 3 years ago. His smoking use included cigarettes. He has a 120.00 pack-year smoking history. He has never used smokeless tobacco. He reports that he does not drink alcohol and does not use drugs.   Family History:  The patient's family history includes COPD in his brother; Diabetes in his mother; Heart disease in his brother.   ROS:  Please see the history of present illness.   Otherwise, review of systems is positive for none.   All other systems are reviewed and negative.   PHYSICAL EXAM: VS:  BP  116/78   Pulse 80   Ht '5\' 6"'$  (1.676 m)   Wt 249 lb (112.9 kg)   BMI 40.19 kg/m  , BMI Body mass index is 40.19 kg/m. GEN: Well nourished, well developed, in no acute distress  HEENT: normal  Neck: no JVD, carotid bruits, or masses Cardiac: RRR; no murmurs, rubs, or gallops,no edema  Respiratory:  clear to auscultation bilaterally, normal work of breathing GI: soft, nontender, nondistended, + BS MS: no deformity or atrophy  Skin: warm and dry Neuro:  Strength and sensation are intact Psych: euthymic mood, full affect  EKG:  EKG is ordered today. Personal review of the ekg ordered shows sinus rhythm, rate 88, right bundle branch block  Recent Labs: 07/30/2020: ALT 18; TSH 0.569 09/08/2020: BUN 14; Creatinine, Ser 1.02; Hemoglobin 12.0; Platelets 206; Potassium 4.3; Sodium 135    Lipid Panel  No results found for: CHOL, TRIG, HDL, CHOLHDL, VLDL, LDLCALC, LDLDIRECT   Wt Readings from Last 3 Encounters:  12/20/20 249 lb (112.9 kg)  10/19/20 250 lb 9.6 oz (113.7 kg)  09/17/20 255 lb (115.7 kg)      Other studies Reviewed: Additional studies/ records that were reviewed today include: Spect 06/25/15  Review of the above records today demonstrates:  The left ventricular ejection fraction is normal (55-65%). Nuclear stress EF: 57%. There was no ST segment deviation noted during stress. The study is normal.   Normal stress nuclear study with no ischemia or infarction; EF 57 with normal wall motion.  Tele monitor 06/17/14 Monitor shows NSVT up 16 beat runs and periods of ventricular escape rhythm in the 30's.    TTE 04/29/18 - Left ventricle: The cavity size was normal. Wall thickness was   normal. Systolic function was normal. The estimated ejection   fraction was in the range of 60% to 65%. Wall motion was normal;   there were no regional wall motion abnormalities. Doppler   parameters are consistent with abnormal left ventricular   relaxation (grade 1 diastolic  dysfunction).    ASSESSMENT AND PLAN:  1.  Nonsustained VT: Has had short-lived episodes.  No obvious further episodes.  2.  Syncope: Has had no further episodes.  3.  Persistent atrial fibrillation: Currently on Xarelto, diltiazem, flecainide.  CHA2DS2-VASc of 4.  High risk medication monitoring.  Has not status post ablation 09/17/2020.  He is remained in sinus rhythm.  He has had no further episodes of atrial fibrillation.  We Trea Latner continue current  4.  Hypertension: Currently well controlled  Current medicines are reviewed at length with the patient today.   The patient does not have concerns regarding his medicines.  The following changes were made today: None  Labs/ tests ordered today include:  Orders Placed This Encounter  Procedures   EKG 12-Lead      Disposition:   FU with Sharmila Wrobleski 3 months  Signed, Rishawn Walck Hassell Done  Elliott, MD  12/20/2020 11:12 AM     [facility] [address] [place] [address] [phone] (office) [phone] (fax)

## 2020-12-22 DIAGNOSIS — E782 Mixed hyperlipidemia: Secondary | ICD-10-CM | POA: Diagnosis not present

## 2020-12-22 DIAGNOSIS — I10 Essential (primary) hypertension: Secondary | ICD-10-CM | POA: Diagnosis not present

## 2020-12-27 ENCOUNTER — Other Ambulatory Visit (HOSPITAL_COMMUNITY): Payer: Self-pay | Admitting: Internal Medicine

## 2020-12-27 DIAGNOSIS — G4733 Obstructive sleep apnea (adult) (pediatric): Secondary | ICD-10-CM | POA: Diagnosis not present

## 2020-12-27 DIAGNOSIS — I480 Paroxysmal atrial fibrillation: Secondary | ICD-10-CM | POA: Diagnosis not present

## 2020-12-27 DIAGNOSIS — D6869 Other thrombophilia: Secondary | ICD-10-CM | POA: Diagnosis not present

## 2020-12-27 DIAGNOSIS — K219 Gastro-esophageal reflux disease without esophagitis: Secondary | ICD-10-CM | POA: Diagnosis not present

## 2020-12-27 DIAGNOSIS — I10 Essential (primary) hypertension: Secondary | ICD-10-CM | POA: Diagnosis not present

## 2020-12-27 DIAGNOSIS — Z0001 Encounter for general adult medical examination with abnormal findings: Secondary | ICD-10-CM | POA: Diagnosis not present

## 2020-12-27 DIAGNOSIS — R7303 Prediabetes: Secondary | ICD-10-CM | POA: Diagnosis not present

## 2020-12-27 DIAGNOSIS — R413 Other amnesia: Secondary | ICD-10-CM | POA: Diagnosis not present

## 2020-12-27 DIAGNOSIS — J449 Chronic obstructive pulmonary disease, unspecified: Secondary | ICD-10-CM | POA: Diagnosis not present

## 2020-12-27 DIAGNOSIS — I6529 Occlusion and stenosis of unspecified carotid artery: Secondary | ICD-10-CM | POA: Diagnosis not present

## 2020-12-27 DIAGNOSIS — I6523 Occlusion and stenosis of bilateral carotid arteries: Secondary | ICD-10-CM

## 2021-01-06 ENCOUNTER — Ambulatory Visit (HOSPITAL_COMMUNITY)
Admission: RE | Admit: 2021-01-06 | Discharge: 2021-01-06 | Disposition: A | Discharge status: Home / Self Care | Payer: Medicare Other | Source: Ambulatory Visit | Attending: Internal Medicine | Admitting: Internal Medicine

## 2021-01-06 ENCOUNTER — Other Ambulatory Visit: Payer: Self-pay

## 2021-01-06 DIAGNOSIS — I6523 Occlusion and stenosis of bilateral carotid arteries: Secondary | ICD-10-CM | POA: Diagnosis not present

## 2021-01-06 NOTE — Progress Notes (Signed)
Carotid duplex has been completed.   Preliminary results in CV Proc.   Abram Sander 01/06/2021 9:15 AM

## 2021-01-15 DIAGNOSIS — G4733 Obstructive sleep apnea (adult) (pediatric): Secondary | ICD-10-CM | POA: Diagnosis not present

## 2021-02-09 ENCOUNTER — Institutional Professional Consult (permissible substitution): Payer: Medicare Other | Admitting: Pulmonary Disease

## 2021-02-21 DIAGNOSIS — H401131 Primary open-angle glaucoma, bilateral, mild stage: Secondary | ICD-10-CM | POA: Diagnosis not present

## 2021-02-21 DIAGNOSIS — H25013 Cortical age-related cataract, bilateral: Secondary | ICD-10-CM | POA: Diagnosis not present

## 2021-02-21 DIAGNOSIS — H2513 Age-related nuclear cataract, bilateral: Secondary | ICD-10-CM | POA: Diagnosis not present

## 2021-03-11 ENCOUNTER — Telehealth: Payer: Self-pay | Admitting: Cardiology

## 2021-03-11 MED ORDER — RIVAROXABAN 20 MG PO TABS: 20.0000 mg | ORAL_TABLET | Freq: Every day | ORAL | 1 refills | Status: DC

## 2021-03-11 NOTE — Telephone Encounter (Signed)
Prescription refill request for Xarelto received.  Indication: afib  Last office visit: Camnitz, 12/20/2020 Scr: 1.02, 09/08/2020 Age: 75 yo  Weight: 112.9 kg  CrCl:98 ml/min   Refill sent.

## 2021-03-11 NOTE — Telephone Encounter (Signed)
*  STAT* If patient is at the pharmacy, call can be transferred to refill team.   1. Which medications need to be refilled? (please list name of each medication and dose if known) rivaroxaban (XARELTO) 20 MG TABS tablet  2. Which pharmacy/location (including street and city if local pharmacy) is medication to be sent to? [place] Phone # [phone] Fax to [phone]  3. Do they need a 30 day or 90 day supply?  90 day  Patient is in donut hole, wife was able to get a deal through the company with this Pharmacy.

## 2021-03-15 DIAGNOSIS — H25811 Combined forms of age-related cataract, right eye: Secondary | ICD-10-CM | POA: Diagnosis not present

## 2021-03-15 DIAGNOSIS — H2511 Age-related nuclear cataract, right eye: Secondary | ICD-10-CM | POA: Diagnosis not present

## 2021-03-15 DIAGNOSIS — H25011 Cortical age-related cataract, right eye: Secondary | ICD-10-CM | POA: Diagnosis not present

## 2021-03-26 ENCOUNTER — Other Ambulatory Visit: Payer: Self-pay | Admitting: Internal Medicine

## 2021-03-28 ENCOUNTER — Encounter: Payer: Self-pay | Admitting: Cardiology

## 2021-03-28 ENCOUNTER — Other Ambulatory Visit: Payer: Self-pay

## 2021-03-28 ENCOUNTER — Ambulatory Visit: Payer: Medicare Other | Admitting: Cardiology

## 2021-03-28 VITALS — BP 128/80 | HR 73 | Ht 65.0 in | Wt 255.0 lb

## 2021-03-28 DIAGNOSIS — I4819 Other persistent atrial fibrillation: Secondary | ICD-10-CM

## 2021-03-28 NOTE — Patient Instructions (Signed)
Medication Instructions:  °Your physician recommends that you continue on your current medications as directed. Please refer to the Current Medication list given to you today. ° °*If you need a refill on your cardiac medications before your next appointment, please call your pharmacy* ° ° °Lab Work: °None ordered ° ° °Testing/Procedures: °None ordered ° ° °Follow-Up: °At [facility], you and your health needs are our priority.  As part of our continuing mission to provide you with exceptional heart care, we have created designated Provider Care Teams.  These Care Teams include your primary Cardiologist (physician) and Advanced Practice Providers (APPs -  Physician Assistants and Nurse Practitioners) who all work together to provide you with the care you need, when you need it. ° °Your next appointment:   °6 month(s) ° °The format for your next appointment:   °In Person ° °Provider:   °Will Camnitz, MD ° ° ° °Thank you for choosing [facility]!! ° ° °Reinhardt Licausi, RN °[phone] °  °

## 2021-03-28 NOTE — Progress Notes (Signed)
Electrophysiology Office Note   Date:  03/28/2021   ID:  Derrick Alexander, DOB [birth year], MRN [id number]  PCP:  [name], MD  Cardiologist:  Debara Pickett Primary Electrophysiologist:  Jaelin Devincentis [name], MD    No chief complaint on file.    History of Present Illness: Derrick Alexander is a 75 y.o. male who presents today for electrophysiology evaluation.    He has a history significant for hypertension, left bundle branch block, tobacco abuse with 60-pack-year history, nonsustained VT, persistent atrial fibrillation.  He had an episode of syncope and wore a monitor that showed a 16 beat run of nonsustained VT.  He has since developed atrial fibrillation.  He is status post ablation 09/14/2020.  Today, denies symptoms of palpitations, chest pain, shortness of breath, orthopnea, PND, lower extremity edema, claudication, dizziness, presyncope, syncope, bleeding, or neurologic sequela. The patient is tolerating medications without difficulties.  He has had no further episodes of atrial fibrillation since being seen.  He currently feels well.  He has no chest pain or shortness of breath.  He is able to do all of his daily activities without restriction.   Past Medical History:  Diagnosis Date   A-fib [facility])    Abnormal EKG 06/18/2015   COPD (chronic obstructive pulmonary disease) (HCC)    COPD with exacerbation ([place])    Faintness 06/18/2015   Hypertension    LBBB (left bundle branch block) 06/18/2015   Obesity    Syncope 08/06/2015   Tobacco use    V-tach 08/06/2015   Past Surgical History:  Procedure Laterality Date   ATRIAL FIBRILLATION ABLATION N/A 09/17/2020   Procedure: ATRIAL FIBRILLATION ABLATION;  Surgeon: Constance Haw, MD;  Location: [place] [facility];  Service: Cardiovascular;  Laterality: N/A;   COLONOSCOPY WITH PROPOFOL N/A 03/07/2019   Procedure: COLONOSCOPY WITH PROPOFOL;  Surgeon: Carol Ada, MD;  [facility];  Service: Endoscopy;  Laterality: N/A;    POLYPECTOMY  03/07/2019   Procedure: POLYPECTOMY;  Surgeon: Carol Ada, MD;  [facility];  Service: Endoscopy;;     Current Outpatient Medications  Medication Sig Dispense Refill   Bioflavonoid Products (ESTER-C) TABS Take 1 tablet by mouth daily.     famotidine (PEPCID) 20 MG tablet Take 20 mg by mouth at bedtime.      furosemide (LASIX) 40 MG tablet TAKE 1 AND 1/2 TABLETS(60 MG) BY MOUTH TWICE DAILY 270 tablet 3   guaiFENesin (MUCINEX) 600 MG 12 hr tablet Take by mouth 2 (two) times daily.     irbesartan (AVAPRO) 300 MG tablet TAKE 1 TABLET(300 MG) BY MOUTH DAILY 90 tablet 3   latanoprost (XALATAN) 0.005 % ophthalmic solution Place 1 drop into both eyes at bedtime.     rivaroxaban (XARELTO) 20 MG TABS tablet Take 1 tablet (20 mg total) by mouth daily with supper. 90 tablet 1   rosuvastatin (CRESTOR) 20 MG tablet Take 20 mg by mouth daily.     sodium chloride (OCEAN) 0.65 % SOLN nasal spray Place 1 spray into both nostrils as needed for congestion.     spironolactone (ALDACTONE) 25 MG tablet Take 1 tablet (25 mg total) by mouth daily. 90 tablet 3   No current facility-administered medications for this visit.    Allergies:   Amiodarone, Diltiazem, and Flecainide   Social History:  The patient  reports that he quit smoking about 3 years ago. His smoking use included cigarettes. He has a 120.00 pack-year smoking history. He has never used smokeless tobacco. He  reports that he does not drink alcohol and does not use drugs.   Family History:  The patient's family history includes COPD in his brother; Diabetes in his mother; Heart disease in his brother.   ROS:  Please see the history of present illness.   Otherwise, review of systems is positive for none.   All other systems are reviewed and negative.   PHYSICAL EXAM: VS:  BP 128/80   Pulse 73   Ht 5\' 5"  (1.651 m)   Wt 255 lb (115.7 kg)   SpO2 95%   BMI 42.43 kg/m  , BMI Body mass index is 42.43 kg/m. GEN: Well  nourished, well developed, in no acute distress  HEENT: normal  Neck: no JVD, carotid bruits, or masses Cardiac: RRR; no murmurs, rubs, or gallops,no edema  Respiratory:  clear to auscultation bilaterally, normal work of breathing GI: soft, nontender, nondistended, + BS MS: no deformity or atrophy  Skin: warm and dry Neuro:  Strength and sensation are intact Psych: euthymic mood, full affect  EKG:  EKG is not ordered today. Personal review of the ekg ordered 12/20/20 shows sinus rhythm, right bundle branch block  Recent Labs: 07/30/2020: ALT 18; TSH 0.569 09/08/2020: BUN 14; Creatinine, Ser 1.02; Hemoglobin 12.0; Platelets 206; Potassium 4.3; Sodium 135    Lipid Panel  No results found for: CHOL, TRIG, HDL, CHOLHDL, VLDL, LDLCALC, LDLDIRECT   Wt Readings from Last 3 Encounters:  03/28/21 255 lb (115.7 kg)  12/20/20 249 lb (112.9 kg)  10/19/20 250 lb 9.6 oz (113.7 kg)      Other studies Reviewed: Additional studies/ records that were reviewed today include: Spect 06/25/15  Review of the above records today demonstrates:  The left ventricular ejection fraction is normal (55-65%). Nuclear stress EF: 57%. There was no ST segment deviation noted during stress. The study is normal.   Normal stress nuclear study with no ischemia or infarction; EF 57 with normal wall motion.  Tele monitor 06/17/14 Monitor shows NSVT up 16 beat runs and periods of ventricular escape rhythm in the 30's.    TTE 04/29/18 - Left ventricle: The cavity size was normal. Wall thickness was   normal. Systolic function was normal. The estimated ejection   fraction was in the range of 60% to 65%. Wall motion was normal;   there were no regional wall motion abnormalities. Doppler   parameters are consistent with abnormal left ventricular   relaxation (grade 1 diastolic dysfunction).    ASSESSMENT AND PLAN:  1.  Nonsustained VT: Short-lived with no further episodes.  Continue to monitor.   2.  Persistent  atrial fibrillation: Currently on Xarelto 20 mg daily.  CHA2DS2-VASc of 4.  Status post ablation 09/17/2020.  He is remained in sinus rhythm without further episodes of atrial fibrillation.  We Domingue Coltrain continue with current management.  3.  Syncope: Has had no further episodes  4.  Hypertension: Currently well controlled  5.  Coronary calcifications: Found on preablation CT.  We Djuna Frechette check fasting lipids today.  Continue Crestor 20 mg.  Current medicines are reviewed at length with the patient today.   The patient does not have concerns regarding his medicines.  The following changes were made today: None  Labs/ tests ordered today include:  No orders of the defined types were placed in this encounter.     Disposition:   FU with Zeferino Mounts 6 months  Signed, Arelia Volpe [name], MD  03/28/2021 10:18 AM     [facility] [address] [place] [address] [phone] (office) [phone] (fax)

## 2021-04-15 DIAGNOSIS — G4733 Obstructive sleep apnea (adult) (pediatric): Secondary | ICD-10-CM | POA: Diagnosis not present

## 2021-07-07 ENCOUNTER — Encounter: Payer: Self-pay | Admitting: Cardiology

## 2021-07-14 DIAGNOSIS — G4733 Obstructive sleep apnea (adult) (pediatric): Secondary | ICD-10-CM | POA: Diagnosis not present

## 2021-07-29 ENCOUNTER — Encounter: Payer: Self-pay | Admitting: Cardiology

## 2021-08-18 ENCOUNTER — Ambulatory Visit: Payer: Medicare Other | Admitting: Pulmonary Disease

## 2021-08-18 ENCOUNTER — Encounter: Payer: Self-pay | Admitting: Pulmonary Disease

## 2021-08-18 VITALS — BP 108/60 | HR 80 | Ht 65.0 in | Wt 263.6 lb

## 2021-08-18 DIAGNOSIS — G4733 Obstructive sleep apnea (adult) (pediatric): Secondary | ICD-10-CM | POA: Diagnosis not present

## 2021-08-18 DIAGNOSIS — J449 Chronic obstructive pulmonary disease, unspecified: Secondary | ICD-10-CM

## 2021-08-18 DIAGNOSIS — Z9989 Dependence on other enabling machines and devices: Secondary | ICD-10-CM

## 2021-08-18 MED ORDER — ALBUTEROL SULFATE HFA 108 (90 BASE) MCG/ACT IN AERS: 2.0000 | INHALATION_SPRAY | Freq: Four times a day (QID) | RESPIRATORY_TRACT | 6 refills | Status: AC | PRN

## 2021-08-18 MED ORDER — SPIRIVA RESPIMAT 2.5 MCG/ACT IN AERS: 2.0000 | INHALATION_SPRAY | Freq: Every day | RESPIRATORY_TRACT | 0 refills | Status: DC

## 2021-08-18 MED ORDER — SPIRIVA RESPIMAT 2.5 MCG/ACT IN AERS: 2.0000 | INHALATION_SPRAY | Freq: Every day | RESPIRATORY_TRACT | 5 refills | Status: DC

## 2021-08-18 NOTE — Patient Instructions (Addendum)
COPD ?--START Spiriva Respimat 2.5 mcg TWO puffs ONCE a day. Will prescribe and provide samples ?--START Albuterol AS NEEDED for shortness of breath or wheezing ?--Encourage regular aerobic exercise up to 20 minutes daily  ?--Discuss portion control, exercise and weight loss ? ?OSA ?The natural history, progression and prognosis of sleep apnea, treatment with PAP and alternative treatment strategies were discussed. The patient was also educated regarding the long term cardiovascular benefits of treating sleep apnea, including improved blood pressure control, reduction in MI and stroke risk as well as other potential benefits of treatment, such as improved glycemic control, facilitation of weight loss, improved energy during the day and improved sleep quality. ?--Counseled on sleep hygiene ?--Counseled on weight loss/maintenance of healthy weight ?--Counseled NOT to drive if/when sleepy ?--Advised patient to wear CPAP for at least 4 hours each night for greater than 70% of the time to avoid the machine being repossessed by insurance. ? ?Follow-up with me in June ?

## 2021-08-18 NOTE — Progress Notes (Signed)
? ? ?Subjective:  ? ?PATIENT ID: Derrick Alexander GENDER: male DOB: 1945-06-22, MRN: [id number] ? ? ?HPI ? ?Chief Complaint  ?Patient presents with  ? Consult  ?  Copd  ?[place]obe ?wheezing  ? ? ?Reason for Visit: New consult for shortness of breath ? ?Derrick Alexander is a 76 year old male former smoker with COPD, OSA, atrial fibrillation, history of VT, hypertension who presents as a new consult for shortness of breath. Wife present and provides history as noted above. ? ?He was previously seen by Dr. Gwenlyn Perking pulmonary on 08/01/2017.  He has been lost to follow-up since then.  Prior note reviewed. ? ?He reports worsening shortness of breath in the last six months. He reports difficulty walking and carrying things without feeling breathless. Has to take breaks when walking down the driveway. Walks slowly up the stairs. Shortness of breath is associated with wheezing with exertion. Minimal cough. Does wheeze at night. Wears CPAP on average 5 hours at night.  He is currently not on inhalers. He reports weight gain 20 lb in the last 6 months. Not active at baseline. ? ?Social History: ?Former smoker. Quit in 2017. >99 pack years. Previously 3 packs daily ?Previously broke his back as a teenager on trampoline ? ?I have personally reviewed patient's past medical/family/social history, allergies, current medications. ? ?Past Medical History:  ?Diagnosis Date  ? A-fib ([place])   ? Abnormal EKG 06/18/2015  ? COPD (chronic obstructive pulmonary disease) ([place])   ? COPD with exacerbation ([place])   ? Faintness 06/18/2015  ? Hypertension   ? LBBB (left bundle branch block) 06/18/2015  ? Obesity   ? Syncope 08/06/2015  ? Tobacco use   ? V-tach 08/06/2015  ?  ? ?Family History  ?Problem Relation Age of Onset  ? Heart disease Brother   ? COPD Brother   ? Diabetes Mother   ?  ? ?Social History  ? ?Occupational History  ? Not on file  ?Tobacco Use  ? Smoking status: Former  ?  Packs/day: 2.00  ?  Years: 60.00  ?  Pack years: 120.00  ?  Types:  Cigarettes  ?  Quit date: 06/13/2017  ?  Years since quitting: 4.1  ? Smokeless tobacco: Never  ?Vaping Use  ? Vaping Use: Never used  ?Substance and Sexual Activity  ? Alcohol use: No  ?  Alcohol/week: 0.0 standard drinks  ? Drug use: No  ? Sexual activity: Not on file  ? ? ?Allergies  ?Allergen Reactions  ? Amiodarone Nausea Only  ?  Nausea, abdominal pain, SOB, dizziness  ? Diltiazem Shortness Of Breath  ? Flecainide Shortness Of Breath  ?  ? ?Outpatient Medications Prior to Visit  ?Medication Sig Dispense Refill  ? Bioflavonoid Products (ESTER-C) TABS Take 1 tablet by mouth daily.    ? cetirizine (ZYRTEC) 10 MG tablet Take 10 mg by mouth daily.    ? famotidine (PEPCID) 20 MG tablet Take 20 mg by mouth at bedtime.     ? furosemide (LASIX) 40 MG tablet TAKE 1 AND 1/2 TABLETS(60 MG) BY MOUTH TWICE DAILY 270 tablet 3  ? irbesartan (AVAPRO) 300 MG tablet TAKE 1 TABLET(300 MG) BY MOUTH DAILY 90 tablet 3  ? latanoprost (XALATAN) 0.005 % ophthalmic solution Place 1 drop into both eyes at bedtime.    ? rivaroxaban (XARELTO) 20 MG TABS tablet Take 1 tablet (20 mg total) by mouth daily with supper. 90 tablet 1  ? rosuvastatin (CRESTOR) 20 MG tablet Take 20 mg  by mouth daily.    ? sodium chloride (OCEAN) 0.65 % SOLN nasal spray Place 1 spray into both nostrils as needed for congestion.    ? spironolactone (ALDACTONE) 25 MG tablet Take 1 tablet (25 mg total) by mouth daily. 90 tablet 3  ? guaiFENesin (MUCINEX) 600 MG 12 hr tablet Take by mouth 2 (two) times daily. (Patient not taking: Reported on 08/18/2021)    ? ?No facility-administered medications prior to visit.  ? ? ?Review of Systems  ?Constitutional:  Negative for chills, diaphoresis, fever, malaise/fatigue and weight loss.  ?HENT:  Negative for congestion.   ?Respiratory:  Positive for shortness of breath. Negative for cough, hemoptysis, sputum production and wheezing.   ?Cardiovascular:  Negative for chest pain, palpitations and leg swelling.  ? ? ?Objective:   ? ?Vitals:  ? 08/18/21 0854  ?BP: 108/60  ?Pulse: 80  ?SpO2: 96%  ?Weight: 263 lb 9.6 oz (119.6 kg)  ?Height: '5\' 5"'$  (1.651 m)  ? ?SpO2: 96 % ?O2 Device: None (Room air) ? ?Physical Exam: ?General: Obese, well-appearing, no acute distress ?HENT: [place], AT ?Eyes: EOMI, no scleral icterus ?Respiratory: Clear to auscultation bilaterally.  No crackles, wheezing or rales ?Cardiovascular: RRR, -M/R/G, no JVD ?Extremities: 1+LLE Edema,-tenderness ?Neuro: AAO x4, CNII-XII grossly intact ?Psych: Normal mood, normal affect ? ?Data Reviewed: ? ?Imaging: ?CXR 06/19/2017 mild cardiomegaly.  No infiltrate, effusion or edema. ?CT cardiac 09/10/2020-CAC score 361 which is 50th percentile.  Visualized lung parenchyma normal, no evidence pulmonary nodules/masses. ? ?PFT: ?08/01/2017 ?FVC 2.38 (74%) FEV1 1.73 (75%) ratio 71 TLC 103% RV 150% RV/TLC 147% DLCO 101% ?Interpretation mild obstructive lung disease with air trapping present normal gas exchange.  No significant bronchodilator response however does not preclude benefit of therapy. ? ?Labs: ?CBC ?   ?Component Value Date/Time  ? WBC 8.2 09/08/2020 1022  ? WBC 8.9 08/01/2017 1039  ? RBC 4.14 09/08/2020 1022  ? RBC 4.82 08/01/2017 1039  ? HGB 12.0 (L) 09/08/2020 1022  ? HCT 35.9 (L) 09/08/2020 1022  ? PLT 206 09/08/2020 1022  ? MCV 87 09/08/2020 1022  ? MCH 29.0 09/08/2020 1022  ? MCH 29.3 06/14/2017 0726  ? MCHC 33.4 09/08/2020 1022  ? MCHC 35.0 08/01/2017 1039  ? RDW 15.2 09/08/2020 1022  ? LYMPHSABS 2.3 08/01/2017 1039  ? MONOABS 1.0 08/01/2017 1039  ? EOSABS 0.1 08/01/2017 1039  ? BASOSABS 0.1 08/01/2017 1039  ? ?Absolute eos 08/01/2017-100 ? ?   ?Assessment & Plan:  ? ?Discussion: ?76 year old male with COPD, atrial fibrillation, history of VT, hypertension who presents as a new consult for shortness of breath. Discussed clinical course and management of COPD ncluding bronchodilator regimen and action plan for exacerbation. ? ?COPD ?--START Spiriva Respimat 2.5 mcg TWO puffs ONCE a  day ?--START Albuterol AS NEEDED for shortness of breath or wheezing ?--Encourage regular aerobic exercise up to 20 minutes daily  ?--Discuss portion control, exercise and weight loss ? ?OSA - managed by PCP ?The natural history, progression and prognosis of sleep apnea, treatment with PAP and alternative treatment strategies were discussed. The patient was also educated regarding the long term cardiovascular benefits of treating sleep apnea, including improved blood pressure control, reduction in MI and stroke risk as well as other potential benefits of treatment, such as improved glycemic control, facilitation of weight loss, improved energy during the day and improved sleep quality. ?--Counseled on sleep hygiene ?--Counseled on weight loss/maintenance of healthy weight ?--Counseled NOT to drive if/when sleepy ?--Advised patient to wear CPAP  for at least 4 hours each night for greater than 70% of the time to avoid the machine being repossessed by insurance. ? ? ?Health Maintenance ?Immunization History  ?Administered Date(s) Administered  ? Influenza, High Dose Seasonal PF 06/14/2017  ? Influenza, Quadrivalent, Recombinant, Inj, Pf 06/14/2017, 01/29/2019  ? Pneumococcal Conjugate-13 12/12/2018  ? Pneumococcal Polysaccharide-23 06/14/2017  ? Tdap 11/06/2017  ? ?CT Lung Screen - discuss at next visit ? ?No orders of the defined types were placed in this encounter. ? ?Meds ordered this encounter  ?Medications  ? Tiotropium Bromide Monohydrate (SPIRIVA RESPIMAT) 2.5 MCG/ACT AERS  ?  Sig: Inhale 2 puffs into the lungs daily.  ?  Dispense:  4 g  ?  Refill:  5  ? albuterol (VENTOLIN HFA) 108 (90 Base) MCG/ACT inhaler  ?  Sig: Inhale 2 puffs into the lungs every 6 (six) hours as needed for wheezing or shortness of breath.  ?  Dispense:  8 g  ?  Refill:  6  ? Tiotropium Bromide Monohydrate (SPIRIVA RESPIMAT) 2.5 MCG/ACT AERS  ?  Sig: Inhale 2 puffs into the lungs daily.  ?  Dispense:  4 g  ?  Refill:  0  ? ? ?Return in  about 3 months (around 11/18/2021). ? ?I have spent a total time of 45-minutes on the day of the appointment reviewing prior documentation, coordinating care and discussing medical diagnosis and plan with the patient/

## 2021-09-04 ENCOUNTER — Other Ambulatory Visit: Payer: Self-pay | Admitting: Cardiology

## 2021-09-05 NOTE — Telephone Encounter (Signed)
Prescription refill request for Xarelto received.  ?Indication: Atrial Fib ?Last office visit: 03/28/21  Elliot Cousin MD ?Weight: 115.7kg ?Age: 76 ?Scr: 1.07 on 12/22/20 ?CrCl: 96.12 ? ?Based on above findings Xarelto '20mg'$  daily is the appropriate dose.  Refill approved. ? ?

## 2021-09-16 DIAGNOSIS — Z961 Presence of intraocular lens: Secondary | ICD-10-CM | POA: Diagnosis not present

## 2021-09-16 DIAGNOSIS — H401131 Primary open-angle glaucoma, bilateral, mild stage: Secondary | ICD-10-CM | POA: Diagnosis not present

## 2021-09-16 DIAGNOSIS — H2511 Age-related nuclear cataract, right eye: Secondary | ICD-10-CM | POA: Diagnosis not present

## 2021-09-17 ENCOUNTER — Other Ambulatory Visit: Payer: Self-pay | Admitting: Physician Assistant

## 2021-10-25 DIAGNOSIS — G4733 Obstructive sleep apnea (adult) (pediatric): Secondary | ICD-10-CM | POA: Diagnosis not present

## 2021-11-15 ENCOUNTER — Encounter: Payer: Self-pay | Admitting: Pulmonary Disease

## 2021-11-15 ENCOUNTER — Ambulatory Visit: Payer: Medicare Other | Admitting: Pulmonary Disease

## 2021-11-15 VITALS — BP 126/70 | HR 59 | Temp 97.9°F | Ht 65.0 in | Wt 261.0 lb

## 2021-11-15 DIAGNOSIS — J449 Chronic obstructive pulmonary disease, unspecified: Secondary | ICD-10-CM

## 2021-11-15 MED ORDER — STIOLTO RESPIMAT 2.5-2.5 MCG/ACT IN AERS: 2.0000 | INHALATION_SPRAY | Freq: Every day | RESPIRATORY_TRACT | 5 refills | Status: DC

## 2021-11-15 MED ORDER — STIOLTO RESPIMAT 2.5-2.5 MCG/ACT IN AERS: 2.0000 | INHALATION_SPRAY | Freq: Every day | RESPIRATORY_TRACT | 0 refills | Status: DC

## 2021-12-05 ENCOUNTER — Telehealth: Payer: Self-pay | Admitting: Pulmonary Disease

## 2021-12-05 NOTE — Telephone Encounter (Signed)
Patient's wife, [name], states he is in the doughnut hole with Medicare and would like samples of Stiolto or a cheaper alternative.   Please advise. Call back number is [phone].

## 2021-12-05 NOTE — Telephone Encounter (Signed)
Called and spoke with pt's spouse [name] letting her know that at this time we do not have any samples of Stiolto. [name] is requesting to see if there might be a different inhaler that is cheaper than the Stiolto.  Pt is currently in the donut hole.  Routing this to prior auth team for review as well as Dr. Loanne Drilling.

## 2021-12-06 ENCOUNTER — Other Ambulatory Visit (HOSPITAL_COMMUNITY): Payer: Self-pay

## 2021-12-06 NOTE — Telephone Encounter (Signed)
What about other options?   Symbicort 160  Advair HFA 115-21  Advair Diskus 250-50  Dulera 100

## 2021-12-07 ENCOUNTER — Other Ambulatory Visit (HOSPITAL_COMMUNITY): Payer: Self-pay

## 2021-12-07 NOTE — Telephone Encounter (Signed)
Attempted to call patient x 2. Left voicemail to call our office to discuss inhaler options.  Please contact patient regarding below:  These are the alternatives to [employer] based on your insurance -  INHALERS WITHOUT STEROIDS --Bevespi $112.18  --Anoro $123.34  INHALERS WITH STEROIDS --Dulera:$91.72  --Advair HFA: $108.58  --Advair Diskus: $84.90   Any of these choices are ok from my standpoint.

## 2021-12-21 ENCOUNTER — Encounter (HOSPITAL_COMMUNITY): Payer: Self-pay

## 2021-12-29 DIAGNOSIS — R7303 Prediabetes: Secondary | ICD-10-CM | POA: Diagnosis not present

## 2021-12-29 DIAGNOSIS — I10 Essential (primary) hypertension: Secondary | ICD-10-CM | POA: Diagnosis not present

## 2021-12-29 DIAGNOSIS — E0590 Thyrotoxicosis, unspecified without thyrotoxic crisis or storm: Secondary | ICD-10-CM | POA: Diagnosis not present

## 2021-12-29 DIAGNOSIS — E782 Mixed hyperlipidemia: Secondary | ICD-10-CM | POA: Diagnosis not present

## 2022-01-02 ENCOUNTER — Other Ambulatory Visit: Payer: Self-pay | Admitting: Internal Medicine

## 2022-01-02 DIAGNOSIS — G4733 Obstructive sleep apnea (adult) (pediatric): Secondary | ICD-10-CM | POA: Diagnosis not present

## 2022-01-02 DIAGNOSIS — R3129 Other microscopic hematuria: Secondary | ICD-10-CM | POA: Diagnosis not present

## 2022-01-02 DIAGNOSIS — I10 Essential (primary) hypertension: Secondary | ICD-10-CM | POA: Diagnosis not present

## 2022-01-02 DIAGNOSIS — Z0000 Encounter for general adult medical examination without abnormal findings: Secondary | ICD-10-CM | POA: Diagnosis not present

## 2022-01-02 DIAGNOSIS — D692 Other nonthrombocytopenic purpura: Secondary | ICD-10-CM | POA: Diagnosis not present

## 2022-01-02 DIAGNOSIS — J449 Chronic obstructive pulmonary disease, unspecified: Secondary | ICD-10-CM | POA: Diagnosis not present

## 2022-01-02 DIAGNOSIS — Z1212 Encounter for screening for malignant neoplasm of rectum: Secondary | ICD-10-CM | POA: Diagnosis not present

## 2022-01-02 DIAGNOSIS — I480 Paroxysmal atrial fibrillation: Secondary | ICD-10-CM | POA: Diagnosis not present

## 2022-01-02 DIAGNOSIS — E0590 Thyrotoxicosis, unspecified without thyrotoxic crisis or storm: Secondary | ICD-10-CM | POA: Diagnosis not present

## 2022-01-02 DIAGNOSIS — Z87891 Personal history of nicotine dependence: Secondary | ICD-10-CM

## 2022-01-02 DIAGNOSIS — L578 Other skin changes due to chronic exposure to nonionizing radiation: Secondary | ICD-10-CM | POA: Diagnosis not present

## 2022-01-02 DIAGNOSIS — I6529 Occlusion and stenosis of unspecified carotid artery: Secondary | ICD-10-CM | POA: Diagnosis not present

## 2022-01-02 DIAGNOSIS — R7303 Prediabetes: Secondary | ICD-10-CM | POA: Diagnosis not present

## 2022-01-02 DIAGNOSIS — K219 Gastro-esophageal reflux disease without esophagitis: Secondary | ICD-10-CM | POA: Diagnosis not present

## 2022-01-02 DIAGNOSIS — E782 Mixed hyperlipidemia: Secondary | ICD-10-CM | POA: Diagnosis not present

## 2022-01-04 ENCOUNTER — Ambulatory Visit
Admission: RE | Admit: 2022-01-04 | Discharge: 2022-01-04 | Disposition: A | Discharge status: Home / Self Care | Payer: Medicare Other | Source: Ambulatory Visit | Attending: Internal Medicine | Admitting: Internal Medicine

## 2022-01-04 DIAGNOSIS — Z87891 Personal history of nicotine dependence: Secondary | ICD-10-CM

## 2022-01-17 DIAGNOSIS — R350 Frequency of micturition: Secondary | ICD-10-CM | POA: Diagnosis not present

## 2022-01-17 DIAGNOSIS — R7989 Other specified abnormal findings of blood chemistry: Secondary | ICD-10-CM | POA: Diagnosis not present

## 2022-01-17 DIAGNOSIS — Z79899 Other long term (current) drug therapy: Secondary | ICD-10-CM | POA: Diagnosis not present

## 2022-01-18 DIAGNOSIS — L820 Inflamed seborrheic keratosis: Secondary | ICD-10-CM | POA: Diagnosis not present

## 2022-01-19 DIAGNOSIS — G4733 Obstructive sleep apnea (adult) (pediatric): Secondary | ICD-10-CM | POA: Diagnosis not present

## 2022-01-25 ENCOUNTER — Other Ambulatory Visit (HOSPITAL_COMMUNITY): Payer: Self-pay

## 2022-02-06 ENCOUNTER — Telehealth (HOSPITAL_COMMUNITY): Payer: Self-pay

## 2022-02-06 ENCOUNTER — Encounter: Payer: Self-pay | Admitting: Cardiology

## 2022-02-06 ENCOUNTER — Ambulatory Visit: Payer: Medicare Other | Attending: Cardiology | Admitting: Cardiology

## 2022-02-06 ENCOUNTER — Ambulatory Visit (INDEPENDENT_AMBULATORY_CARE_PROVIDER_SITE_OTHER): Payer: Medicare Other

## 2022-02-06 VITALS — BP 130/66 | HR 87 | Ht 65.0 in | Wt 265.0 lb

## 2022-02-06 DIAGNOSIS — D6869 Other thrombophilia: Secondary | ICD-10-CM | POA: Diagnosis not present

## 2022-02-06 DIAGNOSIS — I480 Paroxysmal atrial fibrillation: Secondary | ICD-10-CM | POA: Diagnosis not present

## 2022-02-06 DIAGNOSIS — I493 Ventricular premature depolarization: Secondary | ICD-10-CM | POA: Diagnosis not present

## 2022-02-06 NOTE — Progress Notes (Signed)
Electrophysiology Office Note   Date:  02/06/2022   ID:  Derrick Alexander, DOB [birth year], MRN [id number]  PCP:  [name], MD  Cardiologist:  Debara Pickett Primary Electrophysiologist:  Gustav Knueppel [name], MD    No chief complaint on file.     History of Present Illness: Derrick Alexander is a 76 y.o. male who presents today for electrophysiology evaluation.    Is a history significant hypertension, left bundle branch block, tobacco abuse with a 60-pack-year history, nonsustained VT, persistent atrial fibrillation.  He had an episode of cardiac 16 beat run of nonsustained VT.  He has had no further episodes of syncope.  He since developed atrial fibrillation post ablation 09/14/2020.  Today, denies symptoms of palpitations, chest pain, orthopnea, PND, lower extremity edema, claudication, dizziness, presyncope, syncope, bleeding, or neurologic sequela. The patient is tolerating medications without difficulties.  He has baseline shortness of breath.  Aside from that, he has no major complaints.  He is able to do his daily activities with only restriction is his shortness of breath.  His wife feels that it is due to his obesity.   Past Medical History:  Diagnosis Date   A-fib [facility])    Abnormal EKG 06/18/2015   COPD (chronic obstructive pulmonary disease) ([facility])    COPD with exacerbation ([place])    Faintness 06/18/2015   Hypertension    LBBB (left bundle branch block) 06/18/2015   Obesity    Syncope 08/06/2015   Tobacco use    V-tach ([place]) 08/06/2015   Past Surgical History:  Procedure Laterality Date   ATRIAL FIBRILLATION ABLATION N/A 09/17/2020   Procedure: ATRIAL FIBRILLATION ABLATION;  Surgeon: Constance Haw, MD;  Location: [place] [place] [facility];  Service: Cardiovascular;  Laterality: N/A;   COLONOSCOPY WITH PROPOFOL N/A 03/07/2019   Procedure: COLONOSCOPY WITH PROPOFOL;  Surgeon: Carol Ada, MD;  Location: [facility];  Service: Endoscopy;  Laterality: N/A;   POLYPECTOMY  03/07/2019    Procedure: POLYPECTOMY;  Surgeon: Carol Ada, MD;  Location: [facility];  Service: Endoscopy;;     Current Outpatient Medications  Medication Sig Dispense Refill   albuterol (VENTOLIN HFA) 108 (90 Base) MCG/ACT inhaler Inhale 2 puffs into the lungs every 6 (six) hours as needed for wheezing or shortness of breath. 8 g 6   Bioflavonoid Products (ESTER-C) TABS Take 1 tablet by mouth daily.     cetirizine (ZYRTEC) 10 MG tablet Take 10 mg by mouth daily.     famotidine (PEPCID) 20 MG tablet Take 20 mg by mouth at bedtime.      furosemide (LASIX) 40 MG tablet TAKE 1 AND 1/2 TABLETS(60 MG) BY MOUTH TWICE DAILY 270 tablet 1   guaiFENesin (MUCINEX) 600 MG 12 hr tablet Take by mouth 2 (two) times daily.     irbesartan (AVAPRO) 300 MG tablet TAKE 1 TABLET(300 MG) BY MOUTH DAILY 90 tablet 3   latanoprost (XALATAN) 0.005 % ophthalmic solution Place 1 drop into both eyes at bedtime.     rosuvastatin (CRESTOR) 20 MG tablet Take 20 mg by mouth daily.     sodium chloride (OCEAN) 0.65 % SOLN nasal spray Place 1 spray into both nostrils as needed for congestion.     spironolactone (ALDACTONE) 25 MG tablet Take 1 tablet (25 mg total) by mouth daily. 90 tablet 3   XARELTO 20 MG TABS tablet TAKE 1 TABLET(20 MG) BY MOUTH DAILY WITH SUPPER 90 tablet 1   Tiotropium Bromide-Olodaterol (STIOLTO RESPIMAT) 2.5-2.5 MCG/ACT AERS Inhale 2 puffs into the  lungs daily. (Patient not taking: Reported on 02/06/2022) 4 g 5   Tiotropium Bromide-Olodaterol (STIOLTO RESPIMAT) 2.5-2.5 MCG/ACT AERS Inhale 2 puffs into the lungs daily. (Patient not taking: Reported on 02/06/2022) 4 g 0   No current facility-administered medications for this visit.    Allergies:   Amiodarone, Diltiazem, and Flecainide   Social History:  The patient  reports that he quit smoking about 4 years ago. His smoking use included cigarettes. He has a 120.00 pack-year smoking history. He has never used smokeless tobacco. He reports that he does not drink  alcohol and does not use drugs.   Family History:  The patient's family history includes COPD in his brother; Diabetes in his mother; Heart disease in his brother.   ROS:  Please see the history of present illness.   Otherwise, review of systems is positive for none.   All other systems are reviewed and negative.   PHYSICAL EXAM: VS:  BP 130/66   Pulse 87   Ht '5\' 5"'$  (1.651 m)   Wt 265 lb (120.2 kg)   SpO2 96%   BMI 44.10 kg/m  , BMI Body mass index is 44.1 kg/m. GEN: Well nourished, well developed, in no acute distress  HEENT: normal  Neck: no JVD, carotid bruits, or masses Cardiac: RRR; no murmurs, rubs, or gallops,no edema  Respiratory:  clear to auscultation bilaterally, normal work of breathing GI: soft, nontender, nondistended, + BS MS: no deformity or atrophy  Skin: warm and dry Neuro:  Strength and sensation are intact Psych: euthymic mood, full affect  EKG:  EKG is ordered today. Personal review of the ekg ordered shows sinus rhythm, PVCs  Recent Labs: No results found for requested labs within last 365 days.    Lipid Panel  No results found for: "CHOL", "TRIG", "HDL", "CHOLHDL", "VLDL", "LDLCALC", "LDLDIRECT"   Wt Readings from Last 3 Encounters:  02/06/22 265 lb (120.2 kg)  11/15/21 261 lb (118.4 kg)  08/18/21 263 lb 9.6 oz (119.6 kg)      Other studies Reviewed: Additional studies/ records that were reviewed today include: Spect 06/25/15  Review of the above records today demonstrates:  The left ventricular ejection fraction is normal (55-65%). Nuclear stress EF: 57%. There was no ST segment deviation noted during stress. The study is normal.   Normal stress nuclear study with no ischemia or infarction; EF 57 with normal wall motion.  Tele monitor 06/17/14 Monitor shows NSVT up 16 beat runs and periods of ventricular escape rhythm in the 30's.    TTE 04/29/18 - Left ventricle: The cavity size was normal. Wall thickness was   normal. Systolic function  was normal. The estimated ejection   fraction was in the range of 60% to 65%. Wall motion was normal;   there were no regional wall motion abnormalities. Doppler   parameters are consistent with abnormal left ventricular   relaxation (grade 1 diastolic dysfunction).    ASSESSMENT AND PLAN:  1.  Nonsustained VT: Short-lived with no further episodes.  Continue to monitor.  2.  Persistent atrial fibrillation: Currently on Xarelto 20 mg daily.  CHA2DS2-VASc of 4.  Status post ablation 09/07/2020.  Has had no further episodes of atrial fibrillation.  3.  Syncope: No further episodes  4.  Hypertension: Currently well controlled  5.  Coronary artery calcifications: LDL 61.  Continue Crestor 20 mg daily.  6.  Secondary hypercoagulable state: Currently on Xarelto for atrial fibrillation as above  7.  PVCs: Found on ECG today.  He appears asymptomatic.  We Citlalli Weikel have him wear a 7-day monitor for PVC burden.  Current medicines are reviewed at length with the patient today.   The patient does not have concerns regarding his medicines.  The following changes were made today: none  Labs/ tests ordered today include:  Orders Placed This Encounter  Procedures   LONG TERM MONITOR (3-14 DAYS)   EKG 12-Lead       Disposition:   FU 12 months  Signed, Wen Munford [name], MD  02/06/2022 3:35 PM     [facility] [address] [place] [address] [phone] (office) [phone] (fax)

## 2022-02-06 NOTE — Patient Instructions (Signed)
Medication Instructions:  Your physician recommends that you continue on your current medications as directed. Please refer to the Current Medication list given to you today.  *If you need a refill on your cardiac medications before your next appointment, please call your pharmacy*   Lab Work: None ordered If you have labs (blood work) drawn today and your tests are completely normal, you will receive your results only by: [place] (if you have MyChart) OR A paper copy in the mail If you have any lab test that is abnormal or we need to change your treatment, we will call you to review the results.   Testing/Procedures:                           [facility]- Long Term Monitor Instructions  Your physician has requested you wear a ZIO patch monitor for 7 days.  This is a single patch monitor. [employer] supplies one patch monitor per enrollment. Additional stickers are not available. Please do not apply patch if you will be having a Nuclear Stress Test,  Echocardiogram, Cardiac CT, MRI, or Chest Xray during the period you would be wearing the  monitor. The patch cannot be worn during these tests. You cannot remove and re-apply the  ZIO XT patch monitor.  Your ZIO patch monitor will be mailed 3 day USPS to your address on file. It may take 3-5 days  to receive your monitor after you have been enrolled.  Once you have received your monitor, please review the enclosed instructions. Your monitor  has already been registered assigning a specific monitor serial # to you.  Billing and Patient Assistance Program Information  We have supplied [employer] with any of your insurance information on file for billing purposes. [employer] offers a sliding scale Patient Assistance Program for patients that do not have  insurance, or whose insurance does not completely cover the cost of the ZIO monitor.  You must apply for the Patient Assistance Program to qualify for this discounted rate.  To apply, please  call [employer] at [phone], select option 4, select option 2, ask to apply for  Patient Assistance Program. Theodore Demark will ask your household income, and how many people  are in your household. They will quote your out-of-pocket cost based on that information.  [employer] will also be able to set up a 19-month interest-free payment plan if needed.  Applying the monitor   Shave hair from upper left chest.  Hold abrader disc by orange tab. Rub abrader in 40 strokes over the upper left chest as  indicated in your monitor instructions.  Clean area with 4 enclosed alcohol pads. Let dry.  Apply patch as indicated in monitor instructions. Patch will be placed under collarbone on left  side of chest with arrow pointing upward.  Rub patch adhesive wings for 2 minutes. Remove white label marked "1". Remove the white  label marked "2". Rub patch adhesive wings for 2 additional minutes.  While looking in a mirror, press and release button in center of patch. A small green light will  flash 3-4 times. This will be your only indicator that the monitor has been turned on.  Do not shower for the first 24 hours. You may shower after the first 24 hours.  Press the button if you feel a symptom. You will hear a small click. Record Date, Time and  Symptom in the Patient Logbook.  When you are ready to remove the  patch, follow instructions on the last 2 pages of Patient  Logbook. Stick patch monitor onto the last page of Patient Logbook.  Place Patient Logbook in the blue and white box. Use locking tab on box and tape box closed  securely. The blue and white box has prepaid postage on it. Please place it in the mailbox as  soon as possible. Your physician should have your test results approximately 7 days after the  monitor has been mailed back to [facility].  Call [facility] at [phone] if you have questions regarding  your ZIO XT patch monitor. Call them immediately if you see an  orange light blinking on your  monitor.  If your monitor falls off in less than 4 days, contact our Monitor department at [phone].  If your monitor becomes loose or falls off after 4 days call [employer] at [phone] for  suggestions on securing your monitor     Follow-Up: At [facility], you and your health needs are our priority.  As part of our continuing mission to provide you with exceptional heart care, we have created designated Provider Care Teams.  These Care Teams include your primary Cardiologist (physician) and Advanced Practice Providers (APPs -  Physician Assistants and Nurse Practitioners) who all work together to provide you with the care you need, when you need it.  Your next appointment:   1 year(s)  The format for your next appointment:   In Person  Provider:   You will see one of the following Advanced Practice Providers on your designated Care Team:   Tommye Standard, [place] Legrand Como "Jonni Sanger" Chalmers Cater, [place]     Thank you for choosing [facility]!!   Trinidad Curet, RN [phone]  Other Instructions   Important Information About Sugar

## 2022-02-06 NOTE — Telephone Encounter (Signed)
No response from pt regarding PR.   Closed referral. 

## 2022-02-06 NOTE — Progress Notes (Unsigned)
Enrolled for [employer] to mail a ZIO XT long term holter monitor to the patients address on file.  

## 2022-02-11 ENCOUNTER — Other Ambulatory Visit: Payer: Self-pay | Admitting: Cardiology

## 2022-02-13 NOTE — Telephone Encounter (Signed)
Prescription refill request for Xarelto received.  Indication: afib  Last office visit: [place], 02/06/2022 Weight: 120.2 kg  Age: 76 yo  Scr: 1.07, 12/22/2020 CrCl: 98 ml/min   Pt is overdue for blood work.

## 2022-02-13 NOTE — Telephone Encounter (Signed)
Called pt's PCP Dr. Shelia Media to see if they had any recent blood work, [facility] seeing if pt had any blood work with in the past year and if so to fax results to [phone] or if needed call [phone].

## 2022-02-14 ENCOUNTER — Ambulatory Visit: Payer: Medicare Other | Admitting: Pulmonary Disease

## 2022-02-14 ENCOUNTER — Other Ambulatory Visit: Payer: Self-pay

## 2022-02-14 VITALS — BP 118/72 | HR 73 | Ht 66.0 in | Wt 264.2 lb

## 2022-02-14 DIAGNOSIS — J449 Chronic obstructive pulmonary disease, unspecified: Secondary | ICD-10-CM

## 2022-02-14 DIAGNOSIS — Z9989 Dependence on other enabling machines and devices: Secondary | ICD-10-CM | POA: Diagnosis not present

## 2022-02-14 DIAGNOSIS — G4733 Obstructive sleep apnea (adult) (pediatric): Secondary | ICD-10-CM

## 2022-02-14 MED ORDER — FLUTICASONE-SALMETEROL 250-50 MCG/ACT IN AEPB: 1.0000 | INHALATION_SPRAY | Freq: Two times a day (BID) | RESPIRATORY_TRACT | 5 refills | Status: DC

## 2022-02-14 NOTE — Patient Instructions (Addendum)
COPD Deconditioning --DC Stiolto due to cost --Discussed inhaler costs --Declined applying for financial assistance --START Advair 250-50 mcg ONE puff in the morning and in the evening. Rinse mouth out after use --CONTINUE Albuterol AS NEEDED for shortness of breath or wheezing. OK to use prior to exercise --Encourage regular aerobic exercise up to 20 minutes daily   OSA - managed by PCP Self-reports compliance with CPAP --Counseled on sleep hygiene --Counseled on weight loss/maintenance of healthy weight --Counseled NOT to drive if/when sleepy --Advised patient to wear CPAP for at least 4 hours each night for greater than 70% of the time to avoid the machine being repossessed by insurance.  Follow-up with me in 3 months

## 2022-02-14 NOTE — Telephone Encounter (Signed)
Prescription refill request for Xarelto received.  Indication:Afib Last office visit:9/23 Weight:120.2 kg Age:76 Scr:1.2 CrCl:87.65  ml/min  Prescription refilled

## 2022-02-14 NOTE — Progress Notes (Signed)
Subjective:   PATIENT ID: Derrick Alexander GENDER: male DOB: August 25, 1945, MRN: [id number]   HPI  Chief Complaint  Patient presents with   Follow-up    Reason for Visit: Follow-up for shortness of breath  Derrick Alexander is a 76 year old male former smoker with COPD, OSA, atrial fibrillation, history of VT, hypertension who presents for follow-up for COPD  Initial consult He was previously seen by Dr. Gwenlyn Alexander pulmonary on 08/01/2017.  He has been lost to follow-up since then. He reports worsening shortness of breath in the last six months. He reports difficulty walking and carrying things without feeling breathless. Has to take breaks when walking down the driveway. Walks slowly up the stairs. Shortness of breath is associated with wheezing with exertion. Minimal cough. Does wheeze at night. Wears CPAP on average 5 hours at night.  He is currently not on inhalers. He reports weight gain 20 lb in the last 6 months. Not active at baseline.  11/15/2021 Since our last visit he has been compliant with Spiriva. However not sure if he feels a difference. Sometimes standing for long periods of time including in the shower, he has difficulty and will need rest. He does walk a few minutes to the shed. Denies wheezing or coughing.  02/14/22 Since our last visit, he has been off Stiolto for two weeks. He is not planning to buy any meds due to cost. He is hoping that he doesn't need to go to the hospital by the end of the year. When he was on it he had improved shortness of breath and improved wheezing. Now his wife reports he is noticeably more short of breath and not able to walk as far. He is compliant with his CPAP. He does not know if his quality of sleep is improved but has noticed that he is not requiring as many naps during the day and no longer falling asleep while talking.   Social History: Former smoker. Quit in 2017. >99 pack years. Previously 3 packs daily Previously broke his back as a  teenager on trampoline  Past Medical History:  Diagnosis Date   A-fib ([place])    Abnormal EKG 06/18/2015   COPD (chronic obstructive pulmonary disease) (HCC)    COPD with exacerbation ([place])    Faintness 06/18/2015   Hypertension    LBBB (left bundle branch block) 06/18/2015   Obesity    Syncope 08/06/2015   Tobacco use    V-tach ([place]) 08/06/2015     Family History  Problem Relation Age of Onset   Heart disease Brother    COPD Brother    Diabetes Mother      Social History   Occupational History   Not on file  Tobacco Use   Smoking status: Former    Packs/day: 2.00    Years: 60.00    Total pack years: 120.00    Types: Cigarettes    Quit date: 06/13/2017    Years since quitting: 4.6   Smokeless tobacco: Never  Vaping Use   Vaping Use: Never used  Substance and Sexual Activity   Alcohol use: No    Alcohol/week: 0.0 standard drinks of alcohol   Drug use: No   Sexual activity: Not on file    Allergies  Allergen Reactions   Amiodarone Nausea Only    Nausea, abdominal pain, SOB, dizziness   Diltiazem Shortness Of Breath   Flecainide Shortness Of Breath     Outpatient Medications Prior to Visit  Medication Sig Dispense  Refill   albuterol (VENTOLIN HFA) 108 (90 Base) MCG/ACT inhaler Inhale 2 puffs into the lungs every 6 (six) hours as needed for wheezing or shortness of breath. 8 g 6   Bioflavonoid Products (ESTER-C) TABS Take 1 tablet by mouth daily.     cetirizine (ZYRTEC) 10 MG tablet Take 10 mg by mouth daily.     famotidine (PEPCID) 20 MG tablet Take 20 mg by mouth at bedtime.      furosemide (LASIX) 40 MG tablet TAKE 1 AND 1/2 TABLETS(60 MG) BY MOUTH TWICE DAILY 270 tablet 1   guaiFENesin (MUCINEX) 600 MG 12 hr tablet Take by mouth 2 (two) times daily.     irbesartan (AVAPRO) 300 MG tablet TAKE 1 TABLET(300 MG) BY MOUTH DAILY 90 tablet 3   latanoprost (XALATAN) 0.005 % ophthalmic solution Place 1 drop into both eyes at bedtime.     rosuvastatin (CRESTOR) 20 MG  tablet Take 20 mg by mouth daily.     sodium chloride (OCEAN) 0.65 % SOLN nasal spray Place 1 spray into both nostrils as needed for congestion.     spironolactone (ALDACTONE) 25 MG tablet Take 1 tablet (25 mg total) by mouth daily. 90 tablet 3   XARELTO 20 MG TABS tablet TAKE 1 TABLET BY MOUTH EVERY DAY WITH DINNER 90 tablet 1   Tiotropium Bromide-Olodaterol (STIOLTO RESPIMAT) 2.5-2.5 MCG/ACT AERS Inhale 2 puffs into the lungs daily. (Patient not taking: Reported on 02/14/2022) 4 g 5   Tiotropium Bromide-Olodaterol (STIOLTO RESPIMAT) 2.5-2.5 MCG/ACT AERS Inhale 2 puffs into the lungs daily. (Patient not taking: Reported on 02/14/2022) 4 g 0   No facility-administered medications prior to visit.    Review of Systems  Constitutional:  Negative for chills, diaphoresis, fever, malaise/fatigue and weight loss.  HENT:  Negative for congestion.   Respiratory:  Positive for shortness of breath. Negative for cough, hemoptysis, sputum production and wheezing.   Cardiovascular:  Negative for chest pain, palpitations and leg swelling.     Objective:   Vitals:   02/14/22 1105  BP: 118/72  Pulse: 73  SpO2: 98%  Weight: 264 lb 3.2 oz (119.8 kg)  Height: '5\' 6"'$  (1.676 m)   SpO2: 98 % O2 Device: None (Room air)  Physical Exam: General: Obese, well-appearing, no acute distress HENT: [place], AT Eyes: EOMI, no scleral icterus Respiratory: Diminished but clear to auscultation bilaterally.  No crackles, wheezing or rales Cardiovascular: RRR, -M/R/G, no JVD Extremities:-Edema,-tenderness Neuro: AAO x4, CNII-XII grossly intact Psych: Normal mood, normal affect  Data Reviewed:  Imaging: CXR 06/19/2017 mild cardiomegaly.  No infiltrate, effusion or edema. CT cardiac 09/10/2020-CAC score 361 which is 50th percentile.  Visualized lung parenchyma normal, no evidence pulmonary nodules/masses.  PFT: 08/01/2017 FVC 2.38 (74%) FEV1 1.73 (75%) ratio 71 TLC 103% RV 150% RV/TLC 147% DLCO 101% Interpretation  mild obstructive lung disease with air trapping present normal gas exchange.  No significant bronchodilator response however does not preclude benefit of therapy.  Labs: CBC    Component Value Date/Time   WBC 8.2 09/08/2020 1022   WBC 8.9 08/01/2017 1039   RBC 4.14 09/08/2020 1022   RBC 4.82 08/01/2017 1039   HGB 12.0 (L) 09/08/2020 1022   HCT 35.9 (L) 09/08/2020 1022   PLT 206 09/08/2020 1022   MCV 87 09/08/2020 1022   MCH 29.0 09/08/2020 1022   MCH 29.3 06/14/2017 0726   MCHC 33.4 09/08/2020 1022   MCHC 35.0 08/01/2017 1039   RDW 15.2 09/08/2020 1022   LYMPHSABS 2.3  08/01/2017 1039   MONOABS 1.0 08/01/2017 1039   EOSABS 0.1 08/01/2017 1039   BASOSABS 0.1 08/01/2017 1039   Absolute eos 08/01/2017-100     Assessment & Plan:   Discussion: 76 year old male with COPD, atrial fibrillation, hx VT, HTN who presents for follow-up for COPD. Discussed clinical course and management of COPD including bronchodilator regimen and action plan for exacerbation. His dyspnea is secondary to nonadherence to bronchodilators and deconditioning. Compliant with OSA.  COPD Deconditioning --DC Stiolto due to cost --START Advair 250-50 mcg ONE puff in the morning and in the evening. Rinse mouth out after use --CONTINUE Albuterol AS NEEDED for shortness of breath or wheezing. OK to use prior to exercise --Encourage regular aerobic exercise up to 20 minutes daily   OSA - managed by PCP Self-reports compliance with CPAP --Counseled on sleep hygiene --Counseled on weight loss/maintenance of healthy weight --Counseled NOT to drive if/when sleepy --Advised patient to wear CPAP for at least 4 hours each night for greater than 70% of the time to avoid the machine being repossessed by insurance.  Health Maintenance Immunization History  Administered Date(s) Administered   Influenza, High Dose Seasonal PF 06/14/2017   Influenza, Quadrivalent, Recombinant, Inj, Pf 06/14/2017, 01/29/2019   Pneumococcal  Conjugate-13 12/12/2018   Pneumococcal Polysaccharide-23 06/14/2017   Tdap 11/06/2017   CT Lung Screen - not qualified. Due to age  No orders of the defined types were placed in this encounter.  Meds ordered this encounter  Medications   fluticasone-salmeterol (ADVAIR DISKUS) 250-50 MCG/ACT AEPB    Sig: Inhale 1 puff into the lungs in the morning and at bedtime.    Dispense:  60 each    Refill:  5    Return in about 3 months (around 05/16/2022).  I have spent a total time of 32-minutes on the day of the appointment including chart review, data review, collecting history, coordinating care and discussing medical diagnosis and plan with the patient/family. Past medical history, allergies, medications were reviewed. Pertinent imaging, labs and tests included in this note have been reviewed and interpreted independently by me.  Derrick Gangemi Rodman Pickle, MD [facility] Office Number [phone]

## 2022-02-15 DIAGNOSIS — R7309 Other abnormal glucose: Secondary | ICD-10-CM | POA: Diagnosis not present

## 2022-02-15 DIAGNOSIS — Z23 Encounter for immunization: Secondary | ICD-10-CM | POA: Diagnosis not present

## 2022-02-15 DIAGNOSIS — E049 Nontoxic goiter, unspecified: Secondary | ICD-10-CM | POA: Diagnosis not present

## 2022-02-15 DIAGNOSIS — Z8639 Personal history of other endocrine, nutritional and metabolic disease: Secondary | ICD-10-CM | POA: Diagnosis not present

## 2022-02-16 ENCOUNTER — Other Ambulatory Visit: Payer: Self-pay | Admitting: Endocrinology

## 2022-02-16 DIAGNOSIS — E010 Iodine-deficiency related diffuse (endemic) goiter: Secondary | ICD-10-CM

## 2022-02-17 ENCOUNTER — Other Ambulatory Visit: Payer: Self-pay | Admitting: Endocrinology

## 2022-02-17 ENCOUNTER — Other Ambulatory Visit (HOSPITAL_COMMUNITY): Payer: Self-pay | Admitting: Endocrinology

## 2022-02-17 ENCOUNTER — Encounter: Payer: Self-pay | Admitting: Pulmonary Disease

## 2022-02-20 ENCOUNTER — Ambulatory Visit
Admission: RE | Admit: 2022-02-20 | Discharge: 2022-02-20 | Disposition: A | Discharge status: Home / Self Care | Payer: Medicare Other | Source: Ambulatory Visit | Attending: Endocrinology | Admitting: Endocrinology

## 2022-02-20 ENCOUNTER — Other Ambulatory Visit (HOSPITAL_COMMUNITY): Payer: Self-pay | Admitting: Endocrinology

## 2022-02-20 DIAGNOSIS — E010 Iodine-deficiency related diffuse (endemic) goiter: Secondary | ICD-10-CM

## 2022-02-20 DIAGNOSIS — Z8639 Personal history of other endocrine, nutritional and metabolic disease: Secondary | ICD-10-CM

## 2022-02-20 DIAGNOSIS — E041 Nontoxic single thyroid nodule: Secondary | ICD-10-CM | POA: Diagnosis not present

## 2022-02-21 DIAGNOSIS — I493 Ventricular premature depolarization: Secondary | ICD-10-CM

## 2022-03-06 ENCOUNTER — Encounter (HOSPITAL_COMMUNITY)
Admission: RE | Admit: 2022-03-06 | Discharge: 2022-03-06 | Disposition: A | Discharge status: Home / Self Care | Payer: Medicare Other | Source: Ambulatory Visit | Attending: Endocrinology | Admitting: Endocrinology

## 2022-03-06 DIAGNOSIS — Z8639 Personal history of other endocrine, nutritional and metabolic disease: Secondary | ICD-10-CM | POA: Insufficient documentation

## 2022-03-06 MED ORDER — SODIUM IODIDE I-123 7.4 MBQ CAPS: 413.0000 | ORAL_CAPSULE | Freq: Once | ORAL | Status: DC

## 2022-03-07 ENCOUNTER — Encounter (HOSPITAL_COMMUNITY)
Admission: RE | Admit: 2022-03-07 | Discharge: 2022-03-07 | Disposition: A | Discharge status: Home / Self Care | Payer: Medicare Other | Source: Ambulatory Visit | Attending: Endocrinology | Admitting: Endocrinology

## 2022-03-08 DIAGNOSIS — E049 Nontoxic goiter, unspecified: Secondary | ICD-10-CM | POA: Diagnosis not present

## 2022-03-08 DIAGNOSIS — R7303 Prediabetes: Secondary | ICD-10-CM | POA: Diagnosis not present

## 2022-03-08 DIAGNOSIS — I10 Essential (primary) hypertension: Secondary | ICD-10-CM | POA: Diagnosis not present

## 2022-03-08 DIAGNOSIS — R002 Palpitations: Secondary | ICD-10-CM | POA: Diagnosis not present

## 2022-03-08 DIAGNOSIS — I493 Ventricular premature depolarization: Secondary | ICD-10-CM | POA: Diagnosis not present

## 2022-03-10 ENCOUNTER — Telehealth: Payer: Self-pay | Admitting: *Deleted

## 2022-03-10 DIAGNOSIS — I493 Ventricular premature depolarization: Secondary | ICD-10-CM

## 2022-03-10 DIAGNOSIS — I480 Paroxysmal atrial fibrillation: Secondary | ICD-10-CM

## 2022-03-10 NOTE — Telephone Encounter (Signed)
-----   Message from Will [name], MD sent at 03/09/2022 10:19 AM EDT ----- Low AF burden. 12% PVCs. If  symptomatic with SOB will need TTE.

## 2022-03-10 NOTE — Telephone Encounter (Signed)
Spoke to pt and husband..... Pt experiencing SOB w/ activity. Aware recommendation of echocardiogram testing for further evaluation. Aware office will call to schedule. Patient & wife verbalized understanding and agreeable to plan.

## 2022-03-14 ENCOUNTER — Other Ambulatory Visit: Payer: Self-pay | Admitting: Physician Assistant

## 2022-03-14 NOTE — Telephone Encounter (Signed)
Rx refill sent to pharmacy. 

## 2022-03-27 ENCOUNTER — Ambulatory Visit (HOSPITAL_COMMUNITY): Payer: Medicare Other | Attending: Cardiology

## 2022-03-27 DIAGNOSIS — I493 Ventricular premature depolarization: Secondary | ICD-10-CM | POA: Diagnosis not present

## 2022-03-27 DIAGNOSIS — H401131 Primary open-angle glaucoma, bilateral, mild stage: Secondary | ICD-10-CM | POA: Diagnosis not present

## 2022-03-27 DIAGNOSIS — I480 Paroxysmal atrial fibrillation: Secondary | ICD-10-CM | POA: Diagnosis not present

## 2022-03-27 LAB — ECHOCARDIOGRAM COMPLETE
AR max vel: 1.04 cm2
AV Area VTI: 1.06 cm2
AV Area mean vel: 1.01 cm2
AV Mean grad: 26.4 mmHg
AV Peak grad: 52.5 mmHg
Ao pk vel: 3.62 m/s
Area-P 1/2: 3.11 cm2
S' Lateral: 3.7 cm

## 2022-04-27 ENCOUNTER — Inpatient Hospital Stay (HOSPITAL_BASED_OUTPATIENT_CLINIC_OR_DEPARTMENT_OTHER)
Admission: EM | Admit: 2022-04-27 | Discharge: 2022-04-30 | DRG: 378 | Disposition: A | Discharge status: Home / Self Care | Payer: Medicare Other | Attending: Internal Medicine | Admitting: Internal Medicine

## 2022-04-27 ENCOUNTER — Other Ambulatory Visit: Payer: Self-pay

## 2022-04-27 ENCOUNTER — Encounter (HOSPITAL_BASED_OUTPATIENT_CLINIC_OR_DEPARTMENT_OTHER): Payer: Self-pay | Admitting: Radiology

## 2022-04-27 ENCOUNTER — Emergency Department (HOSPITAL_BASED_OUTPATIENT_CLINIC_OR_DEPARTMENT_OTHER): Payer: Medicare Other

## 2022-04-27 ENCOUNTER — Encounter (HOSPITAL_COMMUNITY): Payer: Self-pay

## 2022-04-27 DIAGNOSIS — G4733 Obstructive sleep apnea (adult) (pediatric): Secondary | ICD-10-CM | POA: Diagnosis present

## 2022-04-27 DIAGNOSIS — I4819 Other persistent atrial fibrillation: Secondary | ICD-10-CM | POA: Diagnosis not present

## 2022-04-27 DIAGNOSIS — K921 Melena: Secondary | ICD-10-CM | POA: Diagnosis not present

## 2022-04-27 DIAGNOSIS — I10 Essential (primary) hypertension: Secondary | ICD-10-CM | POA: Diagnosis not present

## 2022-04-27 DIAGNOSIS — I4891 Unspecified atrial fibrillation: Secondary | ICD-10-CM | POA: Diagnosis not present

## 2022-04-27 DIAGNOSIS — J410 Simple chronic bronchitis: Secondary | ICD-10-CM | POA: Diagnosis not present

## 2022-04-27 DIAGNOSIS — K219 Gastro-esophageal reflux disease without esophagitis: Secondary | ICD-10-CM | POA: Diagnosis present

## 2022-04-27 DIAGNOSIS — R5383 Other fatigue: Secondary | ICD-10-CM | POA: Diagnosis not present

## 2022-04-27 DIAGNOSIS — J449 Chronic obstructive pulmonary disease, unspecified: Secondary | ICD-10-CM | POA: Diagnosis not present

## 2022-04-27 DIAGNOSIS — Z8249 Family history of ischemic heart disease and other diseases of the circulatory system: Secondary | ICD-10-CM

## 2022-04-27 DIAGNOSIS — R9431 Abnormal electrocardiogram [ECG] [EKG]: Secondary | ICD-10-CM | POA: Diagnosis present

## 2022-04-27 DIAGNOSIS — Z7901 Long term (current) use of anticoagulants: Secondary | ICD-10-CM

## 2022-04-27 DIAGNOSIS — I447 Left bundle-branch block, unspecified: Secondary | ICD-10-CM | POA: Diagnosis present

## 2022-04-27 DIAGNOSIS — I9589 Other hypotension: Secondary | ICD-10-CM | POA: Diagnosis present

## 2022-04-27 DIAGNOSIS — Z833 Family history of diabetes mellitus: Secondary | ICD-10-CM

## 2022-04-27 DIAGNOSIS — R059 Cough, unspecified: Secondary | ICD-10-CM | POA: Diagnosis not present

## 2022-04-27 DIAGNOSIS — E669 Obesity, unspecified: Secondary | ICD-10-CM | POA: Diagnosis present

## 2022-04-27 DIAGNOSIS — Z825 Family history of asthma and other chronic lower respiratory diseases: Secondary | ICD-10-CM | POA: Diagnosis not present

## 2022-04-27 DIAGNOSIS — D62 Acute posthemorrhagic anemia: Secondary | ICD-10-CM | POA: Diagnosis present

## 2022-04-27 DIAGNOSIS — E861 Hypovolemia: Secondary | ICD-10-CM | POA: Diagnosis present

## 2022-04-27 DIAGNOSIS — R109 Unspecified abdominal pain: Secondary | ICD-10-CM | POA: Diagnosis not present

## 2022-04-27 DIAGNOSIS — K31811 Angiodysplasia of stomach and duodenum with bleeding: Secondary | ICD-10-CM | POA: Diagnosis not present

## 2022-04-27 DIAGNOSIS — Z6841 Body Mass Index (BMI) 40.0 and over, adult: Secondary | ICD-10-CM

## 2022-04-27 DIAGNOSIS — I700 Atherosclerosis of aorta: Secondary | ICD-10-CM | POA: Diagnosis not present

## 2022-04-27 DIAGNOSIS — D500 Iron deficiency anemia secondary to blood loss (chronic): Secondary | ICD-10-CM | POA: Insufficient documentation

## 2022-04-27 DIAGNOSIS — Z888 Allergy status to other drugs, medicaments and biological substances status: Secondary | ICD-10-CM | POA: Diagnosis not present

## 2022-04-27 DIAGNOSIS — E785 Hyperlipidemia, unspecified: Secondary | ICD-10-CM | POA: Diagnosis present

## 2022-04-27 DIAGNOSIS — K922 Gastrointestinal hemorrhage, unspecified: Secondary | ICD-10-CM

## 2022-04-27 DIAGNOSIS — D72829 Elevated white blood cell count, unspecified: Secondary | ICD-10-CM | POA: Diagnosis not present

## 2022-04-27 DIAGNOSIS — I959 Hypotension, unspecified: Secondary | ICD-10-CM | POA: Diagnosis not present

## 2022-04-27 DIAGNOSIS — R0602 Shortness of breath: Secondary | ICD-10-CM | POA: Diagnosis not present

## 2022-04-27 DIAGNOSIS — Z79899 Other long term (current) drug therapy: Secondary | ICD-10-CM | POA: Diagnosis not present

## 2022-04-27 DIAGNOSIS — Z87891 Personal history of nicotine dependence: Secondary | ICD-10-CM

## 2022-04-27 DIAGNOSIS — D509 Iron deficiency anemia, unspecified: Secondary | ICD-10-CM | POA: Diagnosis not present

## 2022-04-27 DIAGNOSIS — G4730 Sleep apnea, unspecified: Secondary | ICD-10-CM | POA: Diagnosis not present

## 2022-04-27 LAB — COMPREHENSIVE METABOLIC PANEL
ALT: 14 U/L (ref 0–44)
AST: 13 U/L — ABNORMAL LOW (ref 15–41)
Albumin: 4.1 g/dL (ref 3.5–5.0)
Alkaline Phosphatase: 44 U/L (ref 38–126)
Anion gap: 7 (ref 5–15)
BUN: 31 mg/dL — ABNORMAL HIGH (ref 8–23)
CO2: 27 mmol/L (ref 22–32)
Calcium: 9.4 mg/dL (ref 8.9–10.3)
Chloride: 100 mmol/L (ref 98–111)
Creatinine, Ser: 1 mg/dL (ref 0.61–1.24)
GFR, Estimated: >60 mL/min (ref >60)
Glucose, Bld: 132 mg/dL — ABNORMAL HIGH (ref 70–99)
Potassium: 4.3 mmol/L (ref 3.5–5.1)
Sodium: 134 mmol/L — ABNORMAL LOW (ref 135–145)
Total Bilirubin: 0.4 mg/dL (ref 0.3–1.2)
Total Protein: 6 g/dL — ABNORMAL LOW (ref 6.5–8.1)

## 2022-04-27 LAB — CBC WITH DIFFERENTIAL/PLATELET
Abs Immature Granulocytes: 0.09 10*3/uL — ABNORMAL HIGH (ref 0.00–0.07)
Basophils Absolute: 0.1 10*3/uL (ref 0.0–0.1)
Basophils Relative: 1 %
Eosinophils Absolute: 0.1 10*3/uL (ref 0.0–0.5)
Eosinophils Relative: 1 %
HCT: 22.5 % — ABNORMAL LOW (ref 39.0–52.0)
Hemoglobin: 7.4 g/dL — ABNORMAL LOW (ref 13.0–17.0)
Immature Granulocytes: 1 %
Lymphocytes Relative: 15 %
Lymphs Abs: 1.7 10*3/uL (ref 0.7–4.0)
MCH: 29.7 pg (ref 26.0–34.0)
MCHC: 32.9 g/dL (ref 30.0–36.0)
MCV: 90.4 fL (ref 80.0–100.0)
Monocytes Absolute: 1 10*3/uL (ref 0.1–1.0)
Monocytes Relative: 9 %
Neutro Abs: 8.4 10*3/uL — ABNORMAL HIGH (ref 1.7–7.7)
Neutrophils Relative %: 73 %
Platelets: 183 10*3/uL (ref 150–400)
RBC: 2.49 MIL/uL — ABNORMAL LOW (ref 4.22–5.81)
RDW: 16.4 % — ABNORMAL HIGH (ref 11.5–15.5)
WBC: 11.4 10*3/uL — ABNORMAL HIGH (ref 4.0–10.5)
nRBC: 0.4 % — ABNORMAL HIGH (ref 0.0–0.2)

## 2022-04-27 LAB — MAGNESIUM: Magnesium: 1.9 mg/dL (ref 1.7–2.4)

## 2022-04-27 LAB — CBC
HCT: 22.2 % — ABNORMAL LOW (ref 39.0–52.0)
Hemoglobin: 7.2 g/dL — ABNORMAL LOW (ref 13.0–17.0)
MCH: 30.3 pg (ref 26.0–34.0)
MCHC: 32.4 g/dL (ref 30.0–36.0)
MCV: 93.3 fL (ref 80.0–100.0)
Platelets: 147 10*3/uL — ABNORMAL LOW (ref 150–400)
RBC: 2.38 MIL/uL — ABNORMAL LOW (ref 4.22–5.81)
RDW: 16.4 % — ABNORMAL HIGH (ref 11.5–15.5)
WBC: 9.9 10*3/uL (ref 4.0–10.5)
nRBC: 0.3 % — ABNORMAL HIGH (ref 0.0–0.2)

## 2022-04-27 LAB — OCCULT BLOOD X 1 CARD TO LAB, STOOL: Fecal Occult Bld: POSITIVE — AB

## 2022-04-27 LAB — PREPARE RBC (CROSSMATCH)

## 2022-04-27 LAB — LIPASE, BLOOD: Lipase: 18 U/L (ref 11–51)

## 2022-04-27 LAB — HEMOGLOBIN AND HEMATOCRIT, BLOOD
HCT: 23 % — ABNORMAL LOW (ref 39.0–52.0)
Hemoglobin: 7.5 g/dL — ABNORMAL LOW (ref 13.0–17.0)

## 2022-04-27 LAB — ABO/RH: ABO/RH(D): O POS

## 2022-04-27 MED ORDER — CHLORHEXIDINE GLUCONATE CLOTH 2 % EX PADS
6.0000 | MEDICATED_PAD | Freq: Every day | CUTANEOUS | Status: DC
  Administered 2022-04-28 – 2022-04-30 (×4): 6 via TOPICAL

## 2022-04-27 MED ORDER — ACETAMINOPHEN 650 MG RE SUPP: 650.0000 mg | Freq: Four times a day (QID) | RECTAL | Status: DC | PRN

## 2022-04-27 MED ORDER — LACTATED RINGERS IV BOLUS
1000.0000 mL | Freq: Once | INTRAVENOUS | Status: AC
  Administered 2022-04-27: 1000 mL via INTRAVENOUS

## 2022-04-27 MED ORDER — ORAL CARE MOUTH RINSE: 15.0000 mL | OROMUCOSAL | Status: DC | PRN

## 2022-04-27 MED ORDER — PANTOPRAZOLE SODIUM 40 MG IV SOLR
40.0000 mg | Freq: Two times a day (BID) | INTRAVENOUS | Status: DC
  Administered 2022-04-28 – 2022-04-30 (×5): 40 mg via INTRAVENOUS
  Filled 2022-04-27 (×5): qty 10

## 2022-04-27 MED ORDER — SODIUM CHLORIDE 0.9 % IV SOLN: INTRAVENOUS | Status: AC

## 2022-04-27 MED ORDER — LATANOPROST 0.005 % OP SOLN
1.0000 [drp] | Freq: Every day | OPHTHALMIC | Status: DC
  Administered 2022-04-28 – 2022-04-29 (×2): 1 [drp] via OPHTHALMIC
  Filled 2022-04-27: qty 2.5

## 2022-04-27 MED ORDER — SODIUM CHLORIDE 0.9 % IV SOLN: INTRAVENOUS | Status: DC

## 2022-04-27 MED ORDER — ACETAMINOPHEN 325 MG PO TABS
650.0000 mg | ORAL_TABLET | Freq: Four times a day (QID) | ORAL | Status: DC | PRN
  Administered 2022-04-29: 650 mg via ORAL
  Filled 2022-04-27 (×2): qty 2

## 2022-04-27 MED ORDER — SODIUM CHLORIDE 0.9 % IV SOLN
10.0000 mL/h | Freq: Once | INTRAVENOUS | Status: AC
  Administered 2022-04-27: 10 mL/h via INTRAVENOUS

## 2022-04-27 MED ORDER — IOHEXOL 300 MG/ML  SOLN
100.0000 mL | Freq: Once | INTRAMUSCULAR | Status: AC | PRN
  Administered 2022-04-27: 100 mL via INTRAVENOUS

## 2022-04-27 MED ORDER — PANTOPRAZOLE SODIUM 40 MG IV SOLR
40.0000 mg | Freq: Once | INTRAVENOUS | Status: AC
  Administered 2022-04-27: 40 mg via INTRAVENOUS
  Filled 2022-04-27: qty 10

## 2022-04-27 MED ORDER — SODIUM CHLORIDE 0.9% IV SOLUTION
Freq: Once | INTRAVENOUS | Status: AC
  Administered 2022-04-28: 400 mL via INTRAVENOUS

## 2022-04-27 NOTE — ED Notes (Signed)
Derrick Alexander from [facility] Probation officer for DB to [employer] transfer.-ABB(NS)

## 2022-04-27 NOTE — ED Notes (Signed)
This RN unsuccessful at IV x 2.

## 2022-04-27 NOTE — ED Notes (Signed)
Patient transported to CT 

## 2022-04-27 NOTE — ED Notes (Signed)
PRBC rate upped to 162m/h

## 2022-04-27 NOTE — H&P (View-Only) (Signed)
Reason for Consult: Melena and anemia Referring Physician: [facility]  Derrick Alexander HPI: This is a 76 year old male with a PMH of afib, COPD, HTN, V-tach, and LBBB admitted for symptomatic anemia.  The patient noticed melena 2 weeks ago.  At that time he did not complain about any abdominal pain, nausea, vomiting, or hematemesis.  Over the course of the two weeks he averaged 2 melenic bowel movements.  Progressively he felt weaker and weaker.  Yesterday it was to the point that he was not able to hold up his arms.  Today he called his PCP, Dr. Shelia Media, and he was instructed to present to the ER.  Upon arrival his HGB was noted to be at 7.4 g/dL and there was some hypotension, however, this resolved with fluid resuscitation.  He denies any prior history of a GI bleed.  On 02/2019 his colonoscopy was positive for three small adenomas and sigmoid colon diverticula.  There is a history of GERD and he was prescribed Pepcid for the past several years  Past Medical History:  Diagnosis Date   A-fib [facility])    Abnormal EKG 06/18/2015   COPD (chronic obstructive pulmonary disease) (HCC)    COPD with exacerbation ([place])    Faintness 06/18/2015   Hypertension    LBBB (left bundle branch block) 06/18/2015   Obesity    Syncope 08/06/2015   Tobacco use    V-tach ([place]) 08/06/2015    Past Surgical History:  Procedure Laterality Date   ATRIAL FIBRILLATION ABLATION N/A 09/17/2020   Procedure: ATRIAL FIBRILLATION ABLATION;  Surgeon: Constance Haw, MD;  Location: [facility];  Service: Cardiovascular;  Laterality: N/A;   COLONOSCOPY WITH PROPOFOL N/A 03/07/2019   Procedure: COLONOSCOPY WITH PROPOFOL;  Surgeon: Carol Ada, MD;  Location: [facility];  Service: Endoscopy;  Laterality: N/A;   POLYPECTOMY  03/07/2019   Procedure: POLYPECTOMY;  Surgeon: Carol Ada, MD;  Location: [facility];  Service: Endoscopy;;    Family History  Problem Relation Age of Onset   Heart disease Brother     COPD Brother    Diabetes Mother     Social History:  reports that he quit smoking about 4 years ago. His smoking use included cigarettes. He has a 120.00 pack-year smoking history. He has never used smokeless tobacco. He reports that he does not drink alcohol and does not use drugs.  Allergies:  Allergies  Allergen Reactions   Amiodarone Nausea Only    Nausea, abdominal pain, SOB, dizziness   Diltiazem Shortness Of Breath   Flecainide Shortness Of Breath    Medications: Scheduled:  sodium chloride   Intravenous Once   Continuous:  sodium chloride 100 mL/hr at 04/27/22 1147    Results for orders placed or performed during the hospital encounter of 04/27/22 (from the past 24 hour(s))  Lipase, blood     Status: None   Collection Time: 04/27/22 11:16 AM  Result Value Ref Range   Lipase 18 11 - 51 U/L  Comprehensive metabolic panel     Status: Abnormal   Collection Time: 04/27/22 11:16 AM  Result Value Ref Range   Sodium 134 (L) 135 - 145 mmol/L   Potassium 4.3 3.5 - 5.1 mmol/L   Chloride 100 98 - 111 mmol/L   CO2 27 22 - 32 mmol/L   Glucose, Bld 132 (H) 70 - 99 mg/dL   BUN 31 (H) 8 - 23 mg/dL   Creatinine, Ser 1.00 0.61 - 1.24 mg/dL   Calcium  9.4 8.9 - 10.3 mg/dL   Total Protein 6.0 (L) 6.5 - 8.1 g/dL   Albumin 4.1 3.5 - 5.0 g/dL   AST 13 (L) 15 - 41 U/L   ALT 14 0 - 44 U/L   Alkaline Phosphatase 44 38 - 126 U/L   Total Bilirubin 0.4 0.3 - 1.2 mg/dL   GFR, Estimated >60 >60 mL/min   Anion gap 7 5 - 15  CBC with Differential/Platelet     Status: Abnormal   Collection Time: 04/27/22 11:16 AM  Result Value Ref Range   WBC 11.4 (H) 4.0 - 10.5 K/uL   RBC 2.49 (L) 4.22 - 5.81 MIL/uL   Hemoglobin 7.4 (L) 13.0 - 17.0 g/dL   HCT 22.5 (L) 39.0 - 52.0 %   MCV 90.4 80.0 - 100.0 fL   MCH 29.7 26.0 - 34.0 pg   MCHC 32.9 30.0 - 36.0 g/dL   RDW 16.4 (H) 11.5 - 15.5 %   Platelets 183 150 - 400 K/uL   nRBC 0.4 (H) 0.0 - 0.2 %   Neutrophils Relative % 73 %   Neutro Abs 8.4 (H)  1.7 - 7.7 K/uL   Lymphocytes Relative 15 %   Lymphs Abs 1.7 0.7 - 4.0 K/uL   Monocytes Relative 9 %   Monocytes Absolute 1.0 0.1 - 1.0 K/uL   Eosinophils Relative 1 %   Eosinophils Absolute 0.1 0.0 - 0.5 K/uL   Basophils Relative 1 %   Basophils Absolute 0.1 0.0 - 0.1 K/uL   Immature Granulocytes 1 %   Abs Immature Granulocytes 0.09 (H) 0.00 - 0.07 K/uL  Occult blood card to lab, stool     Status: Abnormal   Collection Time: 04/27/22  3:10 PM  Result Value Ref Range   Fecal Occult Bld POSITIVE (A) NEGATIVE  Type and screen Ordered by PROVIDER DEFAULT     Status: None (Preliminary result)   Collection Time: 04/27/22  3:58 PM  Result Value Ref Range   ABO/RH(D) PENDING    Antibody Screen PENDING    Sample Expiration      04/30/2022,2359 Performed at [facility], [address], [place], [place] [address]    Unit Number [id number]    Blood Component Type RED CELLS,LR    Unit division 00    Status of Unit ISSUED    Unit tag comment VERBAL ORDERS PER DR ZACHOWSKI    Transfusion Status OK TO TRANSFUSE    Crossmatch Result PENDING   Hemoglobin and hematocrit, blood     Status: Abnormal   Collection Time: 04/27/22  5:26 PM  Result Value Ref Range   Hemoglobin 7.5 (L) 13.0 - 17.0 g/dL   HCT 23.0 (L) 39.0 - 52.0 %     CT Abdomen Pelvis W Contrast  Result Date: 04/27/2022 CLINICAL DATA:  Dark stools.  Acute generalized abdominal pain. EXAM: CT ABDOMEN AND PELVIS WITH CONTRAST TECHNIQUE: Multidetector CT imaging of the abdomen and pelvis was performed using the standard protocol following bolus administration of intravenous contrast. RADIATION DOSE REDUCTION: This exam was performed according to the departmental dose-optimization program which includes automated exposure control, adjustment of the mA and/or kV according to patient size and/or use of iterative reconstruction technique. CONTRAST:  177m OMNIPAQUE IOHEXOL 300 MG/ML  SOLN COMPARISON:  None Available.  FINDINGS: Lower chest: No acute abnormality. Hepatobiliary: No focal liver abnormality is seen. No gallstones, gallbladder wall thickening, or biliary dilatation. Pancreas: Unremarkable. No pancreatic ductal dilatation or surrounding inflammatory changes. Spleen: Normal in  size without focal abnormality. Adrenals/Urinary Tract: Adrenal glands appear normal. Small bilateral renal cysts are noted for which no further follow-up is required. No hydronephrosis or renal obstruction is noted. Urinary bladder is unremarkable. Stomach/Bowel: Stomach is within normal limits. Appendix appears normal. No evidence of bowel wall thickening, distention, or inflammatory changes. Vascular/Lymphatic: Aortic atherosclerosis. No enlarged abdominal or pelvic lymph nodes. Reproductive: Prostate is unremarkable. Other: Small fat containing periumbilical hernia. No ascites is noted. Musculoskeletal: No acute or significant osseous findings. IMPRESSION: No acute abnormality seen in the abdomen or pelvis. Small fat containing periumbilical hernia. Aortic Atherosclerosis (ICD10-I70.0). Electronically Signed   By: Marijo Conception M.D.   On: 04/27/2022 13:44   DG Chest Port 1 View  Result Date: 04/27/2022 CLINICAL DATA:  Shortness of breath, cough. EXAM: PORTABLE CHEST 1 VIEW COMPARISON:  June 19, 2017. FINDINGS: Stable cardiomediastinal silhouette. Both lungs are clear. The visualized skeletal structures are unremarkable. IMPRESSION: No active disease. Electronically Signed   By: Marijo Conception M.D.   On: 04/27/2022 11:34    ROS:  As stated above in the HPI otherwise negative.  Blood pressure (!) 85/50, pulse 100, temperature 98.7 F (37.1 C), temperature source Oral, resp. rate 20, height '5\' 6"'$  (1.676 m), weight 119 kg, SpO2 98 %.    PE: Gen: NAD, Alert and Oriented HEENT:  [place]/AT, EOMI Neck: Supple, no LAD Lungs: CTA Bilaterally CV: RRR without M/G/R ABD: Soft, NTND, +BS Ext: No C/C/E  Assessment/Plan: 1) Melena. 2)  Anemia. 3) Fatigue.   He is hemodynamically stable.  His current presentation is consistent with an upper GI source of bleeding.  Further evaluation will be performed with an EGD.  Plan: 1) EGD tomorrow. 2) Follow HGB and transfuse as necessary. 3) Continue with IV pantoprazole.  Derrick Alexander D 04/27/2022, 6:27 PM

## 2022-04-27 NOTE — Consult Note (Signed)
Medical Consultation   Derrick Alexander  AYO:[id number]  DOB: 12/07/1945  DOA: 04/27/2022  PCP: [name], MD   Requesting physician: ED Physician Dr. Matilde Sprang  Reason for consultation: GI bleed   History of Present Illness: Derrick Alexander is an 76 y.o. male persistent A-fib on Xarelto, COPD, OSA on CPAP, hypertension, NSVT, PVCs, LBBB presented to the ED with a chief complaint of black tarry stools x 2 weeks.  Vital signs on arrival to [place] ED: Temperature 98.3 F, pulse 89, respiratory rate 22, blood pressure 118/104, SpO2 97% on room air.  Labs showing WBC 11.4, hemoglobin 7.4> 7.5 (was 12.0 in April 2022), FOBT positive, sodium 134, BUN 31, creatinine 1.0, no elevation of lipase or LFTs.  CT abdomen pelvis with contrast negative for acute abnormality.  Chest x-ray showing no active disease. Patient was transferred to [facility] long ED.  Derrick Alexander was hypotensive with EMS in route and was given 1 unit PRBCs.  On arrival to [facility] long ED, persistently hypotensive and was given 1 L LR, IV Protonix 40 mg, and additional 2 units PRBCs ordered.  Since patient did not have any large bowel movements in the ED, pharmacy recommended holding off reversal for his Xarelto.  GI consulted and saw the patient, planning on EGD tomorrow.  ED physician requested evaluation by [facility] service for admission.  Patient seen and examined at bedside.  His wife and 2 daughters were also present at bedside.  Derrick Alexander reports 2-week history of dark tarry stools.  Derrick Alexander is reporting fatigue but denies dizziness, shortness of breath, or chest pain.  Derrick Alexander has never had an EGD done in the past.  Denies alcohol or NSAID use.  Denies abdominal pain or hematemesis.  Last dose of Xarelto was 24 hours ago.  No other complaints.  Review of Systems:  As per HPI otherwise review of systems negative.    Past Medical History: Past Medical History:  Diagnosis Date   A-fib [facility])    Abnormal EKG 06/18/2015   COPD (chronic  obstructive pulmonary disease) (HCC)    COPD with exacerbation ([place])    Faintness 06/18/2015   Hypertension    LBBB (left bundle branch block) 06/18/2015   Obesity    Syncope 08/06/2015   Tobacco use    V-tach ([place]) 08/06/2015    Past Surgical History: Past Surgical History:  Procedure Laterality Date   ATRIAL FIBRILLATION ABLATION N/A 09/17/2020   Procedure: ATRIAL FIBRILLATION ABLATION;  Surgeon: Constance Haw, MD;  Location: [place] CV LAB;  Service: Cardiovascular;  Laterality: N/A;   COLONOSCOPY WITH PROPOFOL N/A 03/07/2019   Procedure: COLONOSCOPY WITH PROPOFOL;  Surgeon: Carol Ada, MD;  Location: [facility];  Service: Endoscopy;  Laterality: N/A;   POLYPECTOMY  03/07/2019   Procedure: POLYPECTOMY;  Surgeon: Carol Ada, MD;  Location: [facility];  Service: Endoscopy;;     Allergies:   Allergies  Allergen Reactions   Amiodarone Nausea Only    Nausea, abdominal pain, SOB, dizziness   Diltiazem Shortness Of Breath   Flecainide Shortness Of Breath     Social History:  reports that Derrick Alexander quit smoking about 4 years ago. His smoking use included cigarettes. Derrick Alexander has a 120.00 pack-year smoking history. Derrick Alexander has never used smokeless tobacco. Derrick Alexander reports that Derrick Alexander does not drink alcohol and does not use drugs.   Family History: Family History  Problem Relation Age of Onset   Heart disease Brother  COPD Brother    Diabetes Mother       Physical Exam: Vitals:   04/27/22 1945 04/27/22 2000 04/27/22 2015 04/27/22 2030  BP:  (!) 67/57 (!) 72/26 (!) 80/52  Pulse: (!) 52 92 88 (!) 29  Resp: (!) 22 (!) 38 17 (!) 22  Temp:      TempSrc:      SpO2: 98% 99% 99% (!) 83%  Weight:      Height:        Physical Exam Vitals reviewed.  Constitutional:      General: Derrick Alexander is not in acute distress. HENT:     Head: Normocephalic and atraumatic.  Eyes:     Extraocular Movements: Extraocular movements intact.  Cardiovascular:     Rate and Rhythm: Normal rate and  regular rhythm.     Pulses: Normal pulses.  Pulmonary:     Effort: Pulmonary effort is normal. No respiratory distress.     Breath sounds: No wheezing or rales.  Abdominal:     General: Bowel sounds are normal.     Palpations: Abdomen is soft.     Tenderness: There is no abdominal tenderness. There is no guarding.  Musculoskeletal:        General: No swelling or tenderness.     Cervical back: Normal range of motion.  Skin:    General: Skin is warm and dry.  Neurological:     General: No focal deficit present.     Mental Status: Derrick Alexander is alert and oriented to person, place, and time.       Data reviewed:  I have personally reviewed the recent labs and imaging studies  Pertinent Labs:      Inpatient Medications:   Scheduled Meds:  sodium chloride   Intravenous Once   Continuous Infusions:  sodium chloride 100 mL/hr at 04/27/22 1147     Radiological Exams on Admission: CT Abdomen Pelvis W Contrast  Result Date: 04/27/2022 CLINICAL DATA:  Dark stools.  Acute generalized abdominal pain. EXAM: CT ABDOMEN AND PELVIS WITH CONTRAST TECHNIQUE: Multidetector CT imaging of the abdomen and pelvis was performed using the standard protocol following bolus administration of intravenous contrast. RADIATION DOSE REDUCTION: This exam was performed according to the departmental dose-optimization program which includes automated exposure control, adjustment of the mA and/or kV according to patient size and/or use of iterative reconstruction technique. CONTRAST:  184m OMNIPAQUE IOHEXOL 300 MG/ML  SOLN COMPARISON:  None Available. FINDINGS: Lower chest: No acute abnormality. Hepatobiliary: No focal liver abnormality is seen. No gallstones, gallbladder wall thickening, or biliary dilatation. Pancreas: Unremarkable. No pancreatic ductal dilatation or surrounding inflammatory changes. Spleen: Normal in size without focal abnormality. Adrenals/Urinary Tract: Adrenal glands appear normal. Small bilateral  renal cysts are noted for which no further follow-up is required. No hydronephrosis or renal obstruction is noted. Urinary bladder is unremarkable. Stomach/Bowel: Stomach is within normal limits. Appendix appears normal. No evidence of bowel wall thickening, distention, or inflammatory changes. Vascular/Lymphatic: Aortic atherosclerosis. No enlarged abdominal or pelvic lymph nodes. Reproductive: Prostate is unremarkable. Other: Small fat containing periumbilical hernia. No ascites is noted. Musculoskeletal: No acute or significant osseous findings. IMPRESSION: No acute abnormality seen in the abdomen or pelvis. Small fat containing periumbilical hernia. Aortic Atherosclerosis (ICD10-I70.0). Electronically Signed   By: JMarijo ConceptionM.D.   On: 04/27/2022 13:44   DG Chest Port 1 View  Result Date: 04/27/2022 CLINICAL DATA:  Shortness of breath, cough. EXAM: PORTABLE CHEST 1 VIEW COMPARISON:  June 19, 2017. FINDINGS:  Stable cardiomediastinal silhouette. Both lungs are clear. The visualized skeletal structures are unremarkable. IMPRESSION: No active disease. Electronically Signed   By: Marijo Conception M.D.   On: 04/27/2022 11:34    EKG: Independently reviewed.  Sinus rhythm with PVCs, RBBB, LAFB, QTc 517.  No significant change since prior tracing except QT interval has increased.   Impression/Recommendations Principal Problem:   GI bleed Active Problems:   QT prolongation   Atrial fibrillation (HCC)   Chronic obstructive pulmonary disease (HCC)   Essential hypertension   OSA on CPAP   Acute blood loss anemia   Leukocytosis  Acute upper GI bleed Symptomatic acute blood loss anemia Patient presenting with complaint of black tarry stools x 2 weeks and fatigue.  No hematemesis.  Hemoglobin 7.4> 7.5 (was 12.0 in April 2022) and FOBT positive.  Denies NSAID or alcohol use.  No prior EGD results in the chart.  CT abdomen pelvis with contrast negative for acute abnormality.  Derrick Alexander is on Xarelto which  has been held (last dose was 24 hours ago) and pharmacy had recommended holding off reversal since Derrick Alexander did not have any large bowel movements in the ED.  GI planning on EGD tomorrow.  [facility] hospitalist service called to admit the patient but at the time of my evaluation patient noted to be persistently hypotensive despite IV fluid resuscitation and 3 units PRBCs.  Notified by RN that patient has had blood pressure readings with systolic as low as 07M despite blood pressure cuff being changed multiple times.  I think it is best if patient is admitted to the ICU for closer monitoring. I have discussed this with ED physician Dr. Matilde Sprang who is planning on placing an A-line and will consult critical care for ICU admission.   Time Spent: 60 minutes  Shela Leff M.D. [facility] 04/27/2022, 8:51 PM

## 2022-04-27 NOTE — ED Provider Notes (Addendum)
Physical Exam  BP (!) 85/50   Pulse 100   Temp 98.7 F (37.1 C) (Oral)   Resp 20   Ht '5\' 6"'$  (1.676 m)   Wt 119 kg   SpO2 98%   BMI 42.34 kg/m   Physical Exam Constitutional:      General: He is not in acute distress.    Appearance: Normal appearance.  HENT:     Head: Normocephalic and atraumatic.     Nose: No congestion or rhinorrhea.  Eyes:     General:        Right eye: No discharge.        Left eye: No discharge.     Extraocular Movements: Extraocular movements intact.     Pupils: Pupils are equal, round, and reactive to light.  Cardiovascular:     Rate and Rhythm: Normal rate and regular rhythm.     Heart sounds: No murmur heard. Pulmonary:     Effort: No respiratory distress.     Breath sounds: No wheezing or rales.  Abdominal:     General: There is no distension.     Tenderness: There is no abdominal tenderness.  Musculoskeletal:        General: Normal range of motion.     Cervical back: Normal range of motion.  Skin:    General: Skin is warm and dry.     Coloration: Skin is pale.  Neurological:     General: No focal deficit present.     Mental Status: He is alert.     Procedures  .Critical Care  Performed by: Teressa Lower, MD Authorized by: Teressa Lower, MD   Critical care provider statement:    Critical care time (minutes):  30   Critical care was time spent personally by me on the following activities:  Development of treatment plan with patient or surrogate, discussions with consultants, evaluation of patient's response to treatment, examination of patient, ordering and review of laboratory studies, ordering and review of radiographic studies, ordering and performing treatments and interventions, pulse oximetry, re-evaluation of patient's condition and review of old charts ARTERIAL LINE  Date/Time: 04/27/2022 10:56 PM  Performed by: Teressa Lower, MD Authorized by: Teressa Lower, MD   Consent:    Consent obtained:  Verbal   Consent  given by:  Patient Indications:    Indications: hemodynamic monitoring   Pre-procedure details:    Skin preparation:  Alcohol   Preparation: Patient was prepped and draped in sterile fashion   Sedation:    Sedation type:  None Anesthesia:    Anesthesia method:  None Procedure details:    Location:  R radial   Allen's test performed: yes     Allen's test abnormal: no     Number of attempts:  1   Transducer: waveform confirmed   Post-procedure details:    Post-procedure:  Secured with tape and sutured   CMS:  Normal   Procedure completion:  Tolerated well, no immediate complications   ED Course / MDM    Medical Decision Making Amount and/or Complexity of Data Reviewed Labs: ordered. Radiology: ordered.  Risk Prescription drug management. Decision regarding hospitalization.   Patient received an handoff.  GI bleed that became hypotensive [facility].  Arrives with 1 unit packed red blood cells ordered.  On arrival, patient persistently hypotensive 85/55 received 1 unit packed red blood cells and 1 L lactated Ringer's and blood pressure significantly resolved.  Dr. Benson Norway came to evaluate the patient at bedside and I did order  Protonix.  Patient admitted to the hospitalist.  After admitting the patient to the hospitalist, cuff pressures were erratic and some blood pressures showing readings as low as 60/40.  I worked alongside the ICU attending on call and we decided that I will place a radial A-line for appropriate monitoring of the patient's blood pressure and patient's blood pressures are hovering with maps greater than 65 and systolics around 90.  The ICU attending agreed to help facilitate escalating levels of care if the incoming blood products are not helping with blood pressure but this patient is safe for stepdown bed.       Teressa Lower, MD 04/27/22 Francina Ames, MD 04/27/22 2259

## 2022-04-27 NOTE — ED Notes (Signed)
ED TO INPATIENT HANDOFF REPORT  ED Nurse Name and Phone #: Alroy Bailiff Name/Age/Gender Derrick Alexander 76 y.o. male Room/Bed: WA01/WA01  Code Status   Code Status: Prior  Home/SNF/Other Home Patient oriented to: self, place, time, and situation Is this baseline? Yes   Triage Complete: Triage complete  Chief Complaint GI bleed [K92.2]  Triage Note Pt reports 2 weeks black, tarry stools. Pt is on xarelto for afib. Pt 's face is yellow in triage and wife states not his normal. Pt also has been weak   Allergies Allergies  Allergen Reactions   Amiodarone Nausea Only    Nausea, abdominal pain, SOB, dizziness   Diltiazem Shortness Of Breath   Flecainide Shortness Of Breath    Level of Care/Admitting Diagnosis ED Disposition     ED Disposition  Admit   Condition  --   Comment  Hospital Area: [facility] [[id number]]  Level of Care: Stepdown [14]  Admit to SDU based on following criteria: Hemodynamic compromise or significant risk of instability:  Patient requiring short term acute titration and management of vasoactive drips, and invasive monitoring (i.e., CVP and Arterial line).  May admit patient to [name] if equivalent level of care is available:: Yes  Covid Evaluation: Asymptomatic - no recent exposure (last 10 days) testing not required  Diagnosis: GI bleed [188416]  Admitting Physician: Shela Leff [[id number]]  Attending Physician: Shela Leff [[id number]]  Certification:: I certify this patient will need inpatient services for at least 2 midnights  Estimated Length of Stay: 2          B Medical/Surgery History Past Medical History:  Diagnosis Date   A-fib ([place])    Abnormal EKG 06/18/2015   COPD (chronic obstructive pulmonary disease) ([place])    COPD with exacerbation ([place])    Faintness 06/18/2015   Hypertension    LBBB (left bundle branch block) 06/18/2015   Obesity    Syncope 08/06/2015   Tobacco use    V-tach  ([place]) 08/06/2015   Past Surgical History:  Procedure Laterality Date   ATRIAL FIBRILLATION ABLATION N/A 09/17/2020   Procedure: ATRIAL FIBRILLATION ABLATION;  Surgeon: Constance Haw, MD;  Location: [place] [facility];  Service: Cardiovascular;  Laterality: N/A;   COLONOSCOPY WITH PROPOFOL N/A 03/07/2019   Procedure: COLONOSCOPY WITH PROPOFOL;  Surgeon: Carol Ada, MD;  [facility];  Service: Endoscopy;  Laterality: N/A;   POLYPECTOMY  03/07/2019   Procedure: POLYPECTOMY;  Surgeon: Carol Ada, MD;  [facility];  Service: Endoscopy;;     A IV Location/Drains/Wounds Patient Lines/Drains/Airways Status     Active Line/Drains/Airways     Name Placement date Placement time Site Days   Peripheral IV 04/27/22 18 G 1.88" Left Antecubital 04/27/22  1146  Antecubital  less than 1   Peripheral IV 04/27/22 20 G 1.88" Left;Proximal Forearm 04/27/22  1556  Forearm  less than 1            Intake/Output Last 24 hours  Intake/Output Summary (Last 24 hours) at 04/27/2022 2042 Last data filed at 04/27/2022 1558 Gross per 24 hour  Intake 500 ml  Output --  Net 500 ml    Labs/Imaging Results for orders placed or performed during the hospital encounter of 04/27/22 (from the past 48 hour(s))  Lipase, blood     Status: None   Collection Time: 04/27/22 11:16 AM  Result Value Ref Range   Lipase 18 11 - 51 U/L  Comment: Performed at [facility], [address] [place], [place] [address]  Comprehensive metabolic panel     Status: Abnormal   Collection Time: 04/27/22 11:16 AM  Result Value Ref Range   Sodium 134 (L) 135 - 145 mmol/L   Potassium 4.3 3.5 - 5.1 mmol/L   Chloride 100 98 - 111 mmol/L   CO2 27 22 - 32 mmol/L   Glucose, Bld 132 (H) 70 - 99 mg/dL    Comment: Glucose reference range applies only to samples taken after fasting for at least 8 hours.   BUN 31 (H) 8 - 23 mg/dL   Creatinine, Ser 1.00 0.61 - 1.24 mg/dL   Calcium 9.4  8.9 - 10.3 mg/dL   Total Protein 6.0 (L) 6.5 - 8.1 g/dL   Albumin 4.1 3.5 - 5.0 g/dL   AST 13 (L) 15 - 41 U/L   ALT 14 0 - 44 U/L   Alkaline Phosphatase 44 38 - 126 U/L   Total Bilirubin 0.4 0.3 - 1.2 mg/dL   GFR, Estimated >60 >60 mL/min    Comment: (NOTE) Calculated using the CKD-EPI Creatinine Equation (2021)    Anion gap 7 5 - 15    Comment: Performed at [facility], [address], [place], [place] [address]  CBC with Differential/Platelet     Status: Abnormal   Collection Time: 04/27/22 11:16 AM  Result Value Ref Range   WBC 11.4 (H) 4.0 - 10.5 K/uL   RBC 2.49 (L) 4.22 - 5.81 MIL/uL   Hemoglobin 7.4 (L) 13.0 - 17.0 g/dL   HCT 22.5 (L) 39.0 - 52.0 %   MCV 90.4 80.0 - 100.0 fL   MCH 29.7 26.0 - 34.0 pg   MCHC 32.9 30.0 - 36.0 g/dL   RDW 16.4 (H) 11.5 - 15.5 %   Platelets 183 150 - 400 K/uL   nRBC 0.4 (H) 0.0 - 0.2 %   Neutrophils Relative % 73 %   Neutro Abs 8.4 (H) 1.7 - 7.7 K/uL   Lymphocytes Relative 15 %   Lymphs Abs 1.7 0.7 - 4.0 K/uL   Monocytes Relative 9 %   Monocytes Absolute 1.0 0.1 - 1.0 K/uL   Eosinophils Relative 1 %   Eosinophils Absolute 0.1 0.0 - 0.5 K/uL   Basophils Relative 1 %   Basophils Absolute 0.1 0.0 - 0.1 K/uL   Immature Granulocytes 1 %   Abs Immature Granulocytes 0.09 (H) 0.00 - 0.07 K/uL    Comment: Performed at [facility], [place], [place], [place] [address]  Occult blood card to lab, stool     Status: Abnormal   Collection Time: 04/27/22  3:10 PM  Result Value Ref Range   Fecal Occult Bld POSITIVE (A) NEGATIVE    Comment: Performed at [facility], [address], [place], [place] [address]  Type and screen Ordered by PROVIDER DEFAULT     Status: None (Preliminary result)   Collection Time: 04/27/22  3:58 PM  Result Value Ref Range   ABO/RH(D) PENDING    Antibody Screen PENDING    Sample Expiration      04/30/2022,2359 Performed at [facility],  [address] [place], [place] [address]    Unit Number [id number]    Blood Component Type RED CELLS,LR    Unit division 00    Status of Unit ISSUED    Unit tag comment VERBAL ORDERS PER DR ZACHOWSKI    Transfusion Status OK TO TRANSFUSE  Crossmatch Result PENDING   Hemoglobin and hematocrit, blood     Status: Abnormal   Collection Time: 04/27/22  5:26 PM  Result Value Ref Range   Hemoglobin 7.5 (L) 13.0 - 17.0 g/dL   HCT 23.0 (L) 39.0 - 52.0 %    Comment: Performed at [facility], [place] [address] [place], [place] [address]   CT Abdomen Pelvis W Contrast  Result Date: 04/27/2022 CLINICAL DATA:  Dark stools.  Acute generalized abdominal pain. EXAM: CT ABDOMEN AND PELVIS WITH CONTRAST TECHNIQUE: Multidetector CT imaging of the abdomen and pelvis was performed using the standard protocol following bolus administration of intravenous contrast. RADIATION DOSE REDUCTION: This exam was performed according to the departmental dose-optimization program which includes automated exposure control, adjustment of the mA and/or kV according to patient size and/or use of iterative reconstruction technique. CONTRAST:  138m OMNIPAQUE IOHEXOL 300 MG/ML  SOLN COMPARISON:  None Available. FINDINGS: Lower chest: No acute abnormality. Hepatobiliary: No focal liver abnormality is seen. No gallstones, gallbladder wall thickening, or biliary dilatation. Pancreas: Unremarkable. No pancreatic ductal dilatation or surrounding inflammatory changes. Spleen: Normal in size without focal abnormality. Adrenals/Urinary Tract: Adrenal glands appear normal. Small bilateral renal cysts are noted for which no further follow-up is required. No hydronephrosis or renal obstruction is noted. Urinary bladder is unremarkable. Stomach/Bowel: Stomach is within normal limits. Appendix appears normal. No evidence of bowel wall thickening, distention, or inflammatory changes. Vascular/Lymphatic: Aortic  atherosclerosis. No enlarged abdominal or pelvic lymph nodes. Reproductive: Prostate is unremarkable. Other: Small fat containing periumbilical hernia. No ascites is noted. Musculoskeletal: No acute or significant osseous findings. IMPRESSION: No acute abnormality seen in the abdomen or pelvis. Small fat containing periumbilical hernia. Aortic Atherosclerosis (ICD10-I70.0). Electronically Signed   By: JMarijo ConceptionM.D.   On: 04/27/2022 13:44   DG Chest Port 1 View  Result Date: 04/27/2022 CLINICAL DATA:  Shortness of breath, cough. EXAM: PORTABLE CHEST 1 VIEW COMPARISON:  June 19, 2017. FINDINGS: Stable cardiomediastinal silhouette. Both lungs are clear. The visualized skeletal structures are unremarkable. IMPRESSION: No active disease. Electronically Signed   By: JMarijo ConceptionM.D.   On: 04/27/2022 11:34    Pending Labs Unresulted Labs (From admission, onward)     Start     Ordered   04/27/22 1714  Prepare RBC (crossmatch)  (Blood Administration Adult)  Once,   R       Question Answer Comment  # of Units 2 units   Transfusion Indications Acute blood loss with shock   Number of Units to Keep Ahead NO units ahead   If emergent release call blood bank [phone]     04/27/22 1714   04/27/22 1544  Prepare RBC (crossmatch)  (Adult Blood Administration - PRBC)  Once,   R       Question Answer Comment  # of Units 1 unit   Transfusion Indications Acute blood loss with shock   Number of Units to Keep Ahead NO units ahead   If emergent release call blood bank [facility] [phone]     04/27/22 1544            Vitals/Pain Today's Vitals   04/27/22 1930 04/27/22 1935 04/27/22 1940 04/27/22 1945  BP:  95/63    Pulse: 88 91 85 (!) 52  Resp: 18 (!) 24 19 (!) 22  Temp:      TempSrc:      SpO2: 98% 100% 99% 98%  Weight:  Height:      PainSc:        Isolation Precautions No active isolations  Medications Medications  0.9 %  sodium chloride  infusion ( Intravenous New Bag/Given 04/27/22 1147)  0.9 %  sodium chloride infusion (Manually program via Guardrails IV Fluids) (0 mLs Intravenous Hold 04/27/22 1729)  iohexol (OMNIPAQUE) 300 MG/ML solution 100 mL (100 mLs Intravenous Contrast Given 04/27/22 1252)  0.9 %  sodium chloride infusion (0 mL/hr Intravenous Stopped 04/27/22 1609)  lactated ringers bolus 1,000 mL (1,000 mLs Intravenous New Bag/Given 04/27/22 1728)  pantoprazole (PROTONIX) injection 40 mg (40 mg Intravenous Given 04/27/22 1823)    Mobility walks Low fall risk   Focused Assessments    R Recommendations: See Admitting Provider Note  Report given to:   Additional Notes: Needs a-line

## 2022-04-27 NOTE — Consult Note (Signed)
Reason for Consult: Melena and anemia Referring Physician: [facility]  Ferdinand Cava HPI: This is a 76 year old male with a PMH of afib, COPD, HTN, V-tach, and LBBB admitted for symptomatic anemia.  The patient noticed melena 2 weeks ago.  At that time he did not complain about any abdominal pain, nausea, vomiting, or hematemesis.  Over the course of the two weeks he averaged 2 melenic bowel movements.  Progressively he felt weaker and weaker.  Yesterday it was to the point that he was not able to hold up his arms.  Today he called his PCP, Dr. Shelia Media, and he was instructed to present to the ER.  Upon arrival his HGB was noted to be at 7.4 g/dL and there was some hypotension, however, this resolved with fluid resuscitation.  He denies any prior history of a GI bleed.  On 02/2019 his colonoscopy was positive for three small adenomas and sigmoid colon diverticula.  There is a history of GERD and he was prescribed Pepcid for the past several years  Past Medical History:  Diagnosis Date   A-fib [facility])    Abnormal EKG 06/18/2015   COPD (chronic obstructive pulmonary disease) (HCC)    COPD with exacerbation ([place])    Faintness 06/18/2015   Hypertension    LBBB (left bundle branch block) 06/18/2015   Obesity    Syncope 08/06/2015   Tobacco use    V-tach ([place]) 08/06/2015    Past Surgical History:  Procedure Laterality Date   ATRIAL FIBRILLATION ABLATION N/A 09/17/2020   Procedure: ATRIAL FIBRILLATION ABLATION;  Surgeon: Constance Haw, MD;  Location: [place] [facility];  Service: Cardiovascular;  Laterality: N/A;   COLONOSCOPY WITH PROPOFOL N/A 03/07/2019   Procedure: COLONOSCOPY WITH PROPOFOL;  Surgeon: Carol Ada, MD;  [facility];  Service: Endoscopy;  Laterality: N/A;   POLYPECTOMY  03/07/2019   Procedure: POLYPECTOMY;  Surgeon: Carol Ada, MD;  [facility]: [facility];  Service: Endoscopy;;    Family History  Problem Relation Age of Onset   Heart disease Brother     COPD Brother    Diabetes Mother     Social History:  reports that he quit smoking about 4 years ago. His smoking use included cigarettes. He has a 120.00 pack-year smoking history. He has never used smokeless tobacco. He reports that he does not drink alcohol and does not use drugs.  Allergies:  Allergies  Allergen Reactions   Amiodarone Nausea Only    Nausea, abdominal pain, SOB, dizziness   Diltiazem Shortness Of Breath   Flecainide Shortness Of Breath    Medications: Scheduled:  sodium chloride   Intravenous Once   Continuous:  sodium chloride 100 mL/hr at 04/27/22 1147    Results for orders placed or performed during the hospital encounter of 04/27/22 (from the past 24 hour(s))  Lipase, blood     Status: None   Collection Time: 04/27/22 11:16 AM  Result Value Ref Range   Lipase 18 11 - 51 U/L  Comprehensive metabolic panel     Status: Abnormal   Collection Time: 04/27/22 11:16 AM  Result Value Ref Range   Sodium 134 (L) 135 - 145 mmol/L   Potassium 4.3 3.5 - 5.1 mmol/L   Chloride 100 98 - 111 mmol/L   CO2 27 22 - 32 mmol/L   Glucose, Bld 132 (H) 70 - 99 mg/dL   BUN 31 (H) 8 - 23 mg/dL   Creatinine, Ser 1.00 0.61 - 1.24 mg/dL   Calcium  9.4 8.9 - 10.3 mg/dL   Total Protein 6.0 (L) 6.5 - 8.1 g/dL   Albumin 4.1 3.5 - 5.0 g/dL   AST 13 (L) 15 - 41 U/L   ALT 14 0 - 44 U/L   Alkaline Phosphatase 44 38 - 126 U/L   Total Bilirubin 0.4 0.3 - 1.2 mg/dL   GFR, Estimated >60 >60 mL/min   Anion gap 7 5 - 15  CBC with Differential/Platelet     Status: Abnormal   Collection Time: 04/27/22 11:16 AM  Result Value Ref Range   WBC 11.4 (H) 4.0 - 10.5 K/uL   RBC 2.49 (L) 4.22 - 5.81 MIL/uL   Hemoglobin 7.4 (L) 13.0 - 17.0 g/dL   HCT 22.5 (L) 39.0 - 52.0 %   MCV 90.4 80.0 - 100.0 fL   MCH 29.7 26.0 - 34.0 pg   MCHC 32.9 30.0 - 36.0 g/dL   RDW 16.4 (H) 11.5 - 15.5 %   Platelets 183 150 - 400 K/uL   nRBC 0.4 (H) 0.0 - 0.2 %   Neutrophils Relative % 73 %   Neutro Abs 8.4 (H)  1.7 - 7.7 K/uL   Lymphocytes Relative 15 %   Lymphs Abs 1.7 0.7 - 4.0 K/uL   Monocytes Relative 9 %   Monocytes Absolute 1.0 0.1 - 1.0 K/uL   Eosinophils Relative 1 %   Eosinophils Absolute 0.1 0.0 - 0.5 K/uL   Basophils Relative 1 %   Basophils Absolute 0.1 0.0 - 0.1 K/uL   Immature Granulocytes 1 %   Abs Immature Granulocytes 0.09 (H) 0.00 - 0.07 K/uL  Occult blood card to lab, stool     Status: Abnormal   Collection Time: 04/27/22  3:10 PM  Result Value Ref Range   Fecal Occult Bld POSITIVE (A) NEGATIVE  Type and screen Ordered by PROVIDER DEFAULT     Status: None (Preliminary result)   Collection Time: 04/27/22  3:58 PM  Result Value Ref Range   ABO/RH(D) PENDING    Antibody Screen PENDING    Sample Expiration      04/30/2022,2359 Performed at [facility], [address], [place], [place] [address]    Unit Number [id number]    Blood Component Type RED CELLS,LR    Unit division 00    Status of Unit ISSUED    Unit tag comment VERBAL ORDERS PER DR ZACHOWSKI    Transfusion Status OK TO TRANSFUSE    Crossmatch Result PENDING   Hemoglobin and hematocrit, blood     Status: Abnormal   Collection Time: 04/27/22  5:26 PM  Result Value Ref Range   Hemoglobin 7.5 (L) 13.0 - 17.0 g/dL   HCT 23.0 (L) 39.0 - 52.0 %     CT Abdomen Pelvis W Contrast  Result Date: 04/27/2022 CLINICAL DATA:  Dark stools.  Acute generalized abdominal pain. EXAM: CT ABDOMEN AND PELVIS WITH CONTRAST TECHNIQUE: Multidetector CT imaging of the abdomen and pelvis was performed using the standard protocol following bolus administration of intravenous contrast. RADIATION DOSE REDUCTION: This exam was performed according to the departmental dose-optimization program which includes automated exposure control, adjustment of the mA and/or kV according to patient size and/or use of iterative reconstruction technique. CONTRAST:  119m OMNIPAQUE IOHEXOL 300 MG/ML  SOLN COMPARISON:  None Available.  FINDINGS: Lower chest: No acute abnormality. Hepatobiliary: No focal liver abnormality is seen. No gallstones, gallbladder wall thickening, or biliary dilatation. Pancreas: Unremarkable. No pancreatic ductal dilatation or surrounding inflammatory changes. Spleen: Normal in  size without focal abnormality. Adrenals/Urinary Tract: Adrenal glands appear normal. Small bilateral renal cysts are noted for which no further follow-up is required. No hydronephrosis or renal obstruction is noted. Urinary bladder is unremarkable. Stomach/Bowel: Stomach is within normal limits. Appendix appears normal. No evidence of bowel wall thickening, distention, or inflammatory changes. Vascular/Lymphatic: Aortic atherosclerosis. No enlarged abdominal or pelvic lymph nodes. Reproductive: Prostate is unremarkable. Other: Small fat containing periumbilical hernia. No ascites is noted. Musculoskeletal: No acute or significant osseous findings. IMPRESSION: No acute abnormality seen in the abdomen or pelvis. Small fat containing periumbilical hernia. Aortic Atherosclerosis (ICD10-I70.0). Electronically Signed   By: Marijo Conception M.D.   On: 04/27/2022 13:44   DG Chest Port 1 View  Result Date: 04/27/2022 CLINICAL DATA:  Shortness of breath, cough. EXAM: PORTABLE CHEST 1 VIEW COMPARISON:  June 19, 2017. FINDINGS: Stable cardiomediastinal silhouette. Both lungs are clear. The visualized skeletal structures are unremarkable. IMPRESSION: No active disease. Electronically Signed   By: Marijo Conception M.D.   On: 04/27/2022 11:34    ROS:  As stated above in the HPI otherwise negative.  Blood pressure (!) 85/50, pulse 100, temperature 98.7 F (37.1 C), temperature source Oral, resp. rate 20, height '5\' 6"'$  (1.676 m), weight 119 kg, SpO2 98 %.    PE: Gen: NAD, Alert and Oriented HEENT:  [place]/AT, EOMI Neck: Supple, no LAD Lungs: CTA Bilaterally CV: RRR without M/G/R ABD: Soft, NTND, +BS Ext: No C/C/E  Assessment/Plan: 1) Melena. 2)  Anemia. 3) Fatigue.   He is hemodynamically stable.  His current presentation is consistent with an upper GI source of bleeding.  Further evaluation will be performed with an EGD.  Plan: 1) EGD tomorrow. 2) Follow HGB and transfuse as necessary. 3) Continue with IV pantoprazole.  Alainna Stawicki D 04/27/2022, 6:27 PM

## 2022-04-27 NOTE — ED Notes (Addendum)
Rn noted pt beginning to become hypotensive. Fluids increased to bolus, pt's feet raised to slight trendelenburg, and Dr. Rogene Houston notified

## 2022-04-27 NOTE — ED Notes (Addendum)
Emergency blood obtained from lab. Verified unit with second RN, Angela CN. Pt resting at this time. IV patent, NS infusing. Consent signed electronically by pt's wife. PRBC infusion began 1603.

## 2022-04-27 NOTE — ED Triage Notes (Signed)
Pt reports 2 weeks black, tarry stools. Pt is on xarelto for afib. Pt 's face is yellow in triage and wife states not his normal. Pt also has been weak

## 2022-04-27 NOTE — ED Notes (Signed)
[facility] at facility for transport 

## 2022-04-27 NOTE — H&P (Signed)
History and Physical    Derrick Alexander HCW:[id number] DOB: [birth year] DOA: 04/27/2022  PCP: [name], MD  Patient coming from: Home  Chief Complaint: Dark tarry stools  HPI: Derrick Alexander is a 76 y.o. male with medical history significant of A-fib on Xarelto, COPD, OSA on CPAP, hypertension, NSVT, PVCs, LBBB presenting with a chief complaint of black tarry stools x 2 weeks.  He is reporting fatigue but denies dizziness, shortness of breath, or chest pain. He has never had an EGD done in the past. Denies alcohol or NSAID use. Denies abdominal pain or hematemesis. Last dose of Xarelto was 24 hours ago. No other complaints.   ED Course: Vital signs on arrival to [facility] ED: Temperature 98.3 F, pulse 89, respiratory rate 22, blood pressure 118/104, SpO2 97% on room air.  Labs showing WBC 11.4, hemoglobin 7.4> 7.5 (was 12.0 in April 2022), FOBT positive, sodium 134, BUN 31, creatinine 1.0, no elevation of lipase or LFTs.  CT abdomen pelvis with contrast negative for acute abnormality.  Chest x-ray showing no active disease. Patient was transferred to [facility] long ED.  He was hypotensive with EMS in route and was given 1 unit PRBCs.  On arrival to [facility] long ED, persistently hypotensive and was given 1 L LR, IV Protonix 40 mg, and additional 2 units PRBCs ordered.  Since patient did not have any large bowel movements in the ED, pharmacy recommended holding off reversal for his Xarelto.  GI consulted and saw the patient, planning on EGD tomorrow.    I had initially evaluated the patient in the ED and he was hypotensive at that time.  I had discussed this with ED physician and he had an A-line placed to get more accurate blood pressure readings.  Now maintaining systolic 17-616.  ED physician discussed the case with critical care who felt that the patient did not need ICU admission.  [facility] called to admit the patient.  Review of Systems:  As per HPI otherwise review of systems negative.   Past Medical  History:  Diagnosis Date   A-fib [facility])    Abnormal EKG 06/18/2015   COPD (chronic obstructive pulmonary disease) (HCC)    COPD with exacerbation ([place])    Faintness 06/18/2015   Hypertension    LBBB (left bundle branch block) 06/18/2015   Obesity    Syncope 08/06/2015   Tobacco use    V-tach ([place]) 08/06/2015    Past Surgical History:  Procedure Laterality Date   ATRIAL FIBRILLATION ABLATION N/A 09/17/2020   Procedure: ATRIAL FIBRILLATION ABLATION;  Surgeon: Constance Haw, MD;  Location: [place] [facility];  Service: Cardiovascular;  Laterality: N/A;   COLONOSCOPY WITH PROPOFOL N/A 03/07/2019   Procedure: COLONOSCOPY WITH PROPOFOL;  Surgeon: Carol Ada, MD;  [facility];  Service: Endoscopy;  Laterality: N/A;   POLYPECTOMY  03/07/2019   Procedure: POLYPECTOMY;  Surgeon: Carol Ada, MD;  [facility];  Service: Endoscopy;;     reports that he quit smoking about 4 years ago. His smoking use included cigarettes. He has a 120.00 pack-year smoking history. He has never used smokeless tobacco. He reports that he does not drink alcohol and does not use drugs.  Allergies  Allergen Reactions   Amiodarone Nausea Only    Nausea, abdominal pain, SOB, dizziness   Diltiazem Shortness Of Breath   Flecainide Shortness Of Breath    Family History  Problem Relation Age of Onset   Heart disease Brother    COPD Brother  Diabetes Mother     Prior to Admission medications   Medication Sig Start Date End Date Taking? Authorizing Provider  albuterol (VENTOLIN HFA) 108 (90 Base) MCG/ACT inhaler Inhale 2 puffs into the lungs every 6 (six) hours as needed for wheezing or shortness of breath. 08/18/21  Yes Margaretha Seeds, MD  cetirizine (ZYRTEC) 10 MG tablet Take 10 mg by mouth daily.   Yes [provider]  famotidine (PEPCID) 20 MG tablet Take 20 mg by mouth at bedtime.    Yes [provider]  furosemide (LASIX) 40 MG tablet Take 1.5 tablets (60 mg  total) by mouth 2 (two) times daily. 03/14/22  Yes Camnitz, Will Hassell Done, MD  guaiFENesin (MUCINEX) 600 MG 12 hr tablet Take by mouth 2 (two) times daily.   Yes [provider]  irbesartan (AVAPRO) 300 MG tablet TAKE 1 TABLET(300 MG) BY MOUTH DAILY 03/28/21  Yes Hilty, Nadean Corwin, MD  latanoprost (XALATAN) 0.005 % ophthalmic solution Place 1 drop into both eyes at bedtime.   Yes [provider]  rosuvastatin (CRESTOR) 20 MG tablet Take 20 mg by mouth daily. 12/12/18  Yes [provider]  sodium chloride (OCEAN) 0.65 % SOLN nasal spray Place 1 spray into both nostrils as needed for congestion.   Yes [provider]  spironolactone (ALDACTONE) 25 MG tablet Take 1 tablet (25 mg total) by mouth daily. 07/23/18  Yes Hilty, Nadean Corwin, MD  XARELTO 20 MG TABS tablet TAKE 1 TABLET BY MOUTH EVERY DAY WITH DINNER 02/14/22  Yes Camnitz, Will Hassell Done, MD  Bioflavonoid Products (ESTER-C) TABS Take 1 tablet by mouth daily.    [provider]  fluticasone-salmeterol (ADVAIR DISKUS) 250-50 MCG/ACT AEPB Inhale 1 puff into the lungs in the morning and at bedtime. Patient not taking: Reported on 04/27/2022 02/14/22   Margaretha Seeds, MD    Physical Exam: Vitals:   04/27/22 2130 04/27/22 2200 04/27/22 2223 04/27/22 2224  BP: (!) 89/73 (!) 106/51 (!) 91/50   Pulse: 90     Resp: 15     Temp:    98.3 F (36.8 C)  TempSrc:    Oral  SpO2: 98%     Weight:      Height:        Physical Exam Vitals reviewed.  Constitutional:      General: He is not in acute distress. HENT:     Head: Normocephalic and atraumatic.  Eyes:     Extraocular Movements: Extraocular movements intact.  Cardiovascular:     Rate and Rhythm: Normal rate and regular rhythm.     Pulses: Normal pulses.  Pulmonary:     Effort: Pulmonary effort is normal. No respiratory distress.     Breath sounds: No wheezing or rales.  Abdominal:     General: Bowel sounds are normal.     Palpations: Abdomen is soft.      Tenderness: There is no abdominal tenderness. There is no guarding.  Musculoskeletal:        General: No swelling or tenderness.     Cervical back: Normal range of motion.  Skin:    General: Skin is warm and dry.  Neurological:     General: No focal deficit present.     Mental Status: He is alert and oriented to person, place, and time.   Labs on Admission: I have personally reviewed following labs and imaging studies  CBC: Recent Labs  Lab 04/27/22 1116 04/27/22 1726  WBC 11.4*  --  NEUTROABS 8.4*  --   HGB 7.4* 7.5*  HCT 22.5* 23.0*  MCV 90.4  --   PLT 183  --    Basic Metabolic Panel: Recent Labs  Lab 04/27/22 1116  NA 134*  K 4.3  CL 100  CO2 27  GLUCOSE 132*  BUN 31*  CREATININE 1.00  CALCIUM 9.4   GFR: Estimated Creatinine Clearance: 75.2 mL/min (by C-G formula based on SCr of 1 mg/dL). Liver Function Tests: Recent Labs  Lab 04/27/22 1116  AST 13*  ALT 14  ALKPHOS 44  BILITOT 0.4  PROT 6.0*  ALBUMIN 4.1   Recent Labs  Lab 04/27/22 1116  LIPASE 18   No results for input(s): "AMMONIA" in the last 168 hours. Coagulation Profile: No results for input(s): "INR", "PROTIME" in the last 168 hours. Cardiac Enzymes: No results for input(s): "CKTOTAL", "CKMB", "CKMBINDEX", "TROPONINI" in the last 168 hours. BNP (last 3 results) No results for input(s): "PROBNP" in the last 8760 hours. HbA1C: No results for input(s): "HGBA1C" in the last 72 hours. CBG: No results for input(s): "GLUCAP" in the last 168 hours. Lipid Profile: No results for input(s): "CHOL", "HDL", "LDLCALC", "TRIG", "CHOLHDL", "LDLDIRECT" in the last 72 hours. Thyroid Function Tests: No results for input(s): "TSH", "T4TOTAL", "FREET4", "T3FREE", "THYROIDAB" in the last 72 hours. Anemia Panel: No results for input(s): "VITAMINB12", "FOLATE", "FERRITIN", "TIBC", "IRON", "RETICCTPCT" in the last 72 hours. Urine analysis: No results found for: "COLORURINE", "APPEARANCEUR", "LABSPEC",  "PHURINE", "GLUCOSEU", "HGBUR", "BILIRUBINUR", "KETONESUR", "PROTEINUR", "UROBILINOGEN", "NITRITE", "LEUKOCYTESUR"  Radiological Exams on Admission: CT Abdomen Pelvis W Contrast  Result Date: 04/27/2022 CLINICAL DATA:  Dark stools.  Acute generalized abdominal pain. EXAM: CT ABDOMEN AND PELVIS WITH CONTRAST TECHNIQUE: Multidetector CT imaging of the abdomen and pelvis was performed using the standard protocol following bolus administration of intravenous contrast. RADIATION DOSE REDUCTION: This exam was performed according to the departmental dose-optimization program which includes automated exposure control, adjustment of the mA and/or kV according to patient size and/or use of iterative reconstruction technique. CONTRAST:  164m OMNIPAQUE IOHEXOL 300 MG/ML  SOLN COMPARISON:  None Available. FINDINGS: Lower chest: No acute abnormality. Hepatobiliary: No focal liver abnormality is seen. No gallstones, gallbladder wall thickening, or biliary dilatation. Pancreas: Unremarkable. No pancreatic ductal dilatation or surrounding inflammatory changes. Spleen: Normal in size without focal abnormality. Adrenals/Urinary Tract: Adrenal glands appear normal. Small bilateral renal cysts are noted for which no further follow-up is required. No hydronephrosis or renal obstruction is noted. Urinary bladder is unremarkable. Stomach/Bowel: Stomach is within normal limits. Appendix appears normal. No evidence of bowel wall thickening, distention, or inflammatory changes. Vascular/Lymphatic: Aortic atherosclerosis. No enlarged abdominal or pelvic lymph nodes. Reproductive: Prostate is unremarkable. Other: Small fat containing periumbilical hernia. No ascites is noted. Musculoskeletal: No acute or significant osseous findings. IMPRESSION: No acute abnormality seen in the abdomen or pelvis. Small fat containing periumbilical hernia. Aortic Atherosclerosis (ICD10-I70.0). Electronically Signed   By: JMarijo ConceptionM.D.   On:  04/27/2022 13:44   DG Chest Port 1 View  Result Date: 04/27/2022 CLINICAL DATA:  Shortness of breath, cough. EXAM: PORTABLE CHEST 1 VIEW COMPARISON:  June 19, 2017. FINDINGS: Stable cardiomediastinal silhouette. Both lungs are clear. The visualized skeletal structures are unremarkable. IMPRESSION: No active disease. Electronically Signed   By: JMarijo ConceptionM.D.   On: 04/27/2022 11:34    EKG: Independently reviewed.  Sinus rhythm with PVCs, RBBB, LAFB, QTc 517.  No significant change since prior tracing except QT interval has increased.  Assessment and Plan  Acute upper GI bleed Symptomatic acute blood loss anemia Patient presenting with complaint of black tarry stools x 2 weeks and fatigue.  No hematemesis.  Hemoglobin 7.4> 7.5 (was 12.0 in April 2022) and FOBT positive.  Denies NSAID or alcohol use.  No prior EGD results in the chart.  CT abdomen pelvis with contrast negative for acute abnormality.  He is on Xarelto which has been held (last dose was 24 hours ago) and pharmacy had recommended holding off reversal since he did not have any large bowel movements in the ED.  GI planning on EGD tomorrow.  Patient received 1 L LR bolus, IV Protonix 40 mg, and 3 unit PRBCs in the ED.  Persistently hypotensive and A-line was placed to get more accurate blood pressure readings.  Most recent blood pressure 118/63.  Critical care feels he does not need ICU admission. -Admit to stepdown unit and monitor hemodynamics closely -GI planning on EGD tomorrow -Keep n.p.o. -Serial H&H every 6 hours, continue to transfuse PRBCs if hemoglobin low -IV fluid hydration -IV Protonix 40 mg every 12 hours   Mild leukocytosis Likely reactive.  No infectious signs or symptoms. -Continue to monitor   QT prolongation QTc 517.  Potassium is within normal range. -Cardiac monitoring -Avoid QT prolonging drugs if possible -Check magnesium level and replace if low. -Repeat EKG in a.m.   Paroxysmal A-fib -Hold  Xarelto given acute GI bleed   COPD Stable, no signs of acute exacerbation.  Per pharmacy med rec, patient is no longer on a maintenance inhaler.  OSA -Continue nightly CPAP   Hypertension -Hold antihypertensives at this time  DVT prophylaxis: SCDs Code Status: Full Code (discussed with the patient) Family Communication: Patient's wife and 2 daughters at bedside. Consults called: Critical care Level of care: Step Down Unit Admission status: It is my clinical opinion that admission to INPATIENT is reasonable and necessary because of the expectation that this patient will require hospital care that crosses at least 2 midnights to treat this condition based on the medical complexity of the problems presented.  Given the aforementioned information, the predictability of an adverse outcome is felt to be significant.   Shela Leff MD [facility]  If 7PM-7AM, please contact night-coverage [email or web address]  04/27/2022, 10:42 PM

## 2022-04-27 NOTE — ED Provider Notes (Signed)
[facility] Provider Note   CSN: [id number] Arrival date & time: 04/27/22  1058     History  Chief Complaint  Patient presents with   Melena    Derrick Alexander is a 76 y.o. male.  Patient brought in by his wife.  Patient with a history of black tarry stools for 2 weeks.  Patient is on Xarelto for atrial fibs.  Patient was noted by wife to be very yellow very abnormal color for him and has been weak.  But no pain anywhere.  Not feeling like he is going to pass out.  No shortness of breath.  Vital signs temp 98.3 oxygen sats 97% blood pressure 118/104 pulse 89 respirations 22.  Past medical history is significant for left bundle branch block history of V. tach in 2017.  Hypertension longstanding history atrial fibs on Xarelto for that COPD.  Tobacco use.  But patient quit in 2019.  Past surgical history had an atrial fibrillation ablation in April 2022.  Patient had a colonoscopy done in 2020 with some polypectomies.  Patient without any nausea or vomiting.       Home Medications Prior to Admission medications   Medication Sig Start Date End Date Taking? Authorizing Provider  albuterol (VENTOLIN HFA) 108 (90 Base) MCG/ACT inhaler Inhale 2 puffs into the lungs every 6 (six) hours as needed for wheezing or shortness of breath. 08/18/21  Yes Margaretha Seeds, MD  cetirizine (ZYRTEC) 10 MG tablet Take 10 mg by mouth daily.   Yes [provider]  famotidine (PEPCID) 20 MG tablet Take 20 mg by mouth at bedtime.    Yes [provider]  furosemide (LASIX) 40 MG tablet Take 1.5 tablets (60 mg total) by mouth 2 (two) times daily. 03/14/22  Yes Camnitz, Will Hassell Done, MD  guaiFENesin (MUCINEX) 600 MG 12 hr tablet Take by mouth 2 (two) times daily.   Yes [provider]  irbesartan (AVAPRO) 300 MG tablet TAKE 1 TABLET(300 MG) BY MOUTH DAILY 03/28/21  Yes Hilty, Nadean Corwin, MD  latanoprost (XALATAN) 0.005 % ophthalmic solution Place 1 drop into both  eyes at bedtime.   Yes [provider]  rosuvastatin (CRESTOR) 20 MG tablet Take 20 mg by mouth daily. 12/12/18  Yes [provider]  sodium chloride (OCEAN) 0.65 % SOLN nasal spray Place 1 spray into both nostrils as needed for congestion.   Yes [provider]  spironolactone (ALDACTONE) 25 MG tablet Take 1 tablet (25 mg total) by mouth daily. 07/23/18  Yes Hilty, Nadean Corwin, MD  XARELTO 20 MG TABS tablet TAKE 1 TABLET BY MOUTH EVERY DAY WITH DINNER 02/14/22  Yes Camnitz, Will Hassell Done, MD  Bioflavonoid Products (ESTER-C) TABS Take 1 tablet by mouth daily.    [provider]  fluticasone-salmeterol (ADVAIR DISKUS) 250-50 MCG/ACT AEPB Inhale 1 puff into the lungs in the morning and at bedtime. Patient not taking: Reported on 04/27/2022 02/14/22   Margaretha Seeds, MD      Allergies    Amiodarone, Diltiazem, and Flecainide    Review of Systems   Review of Systems  Constitutional:  Positive for fatigue. Negative for chills and fever.  HENT:  Negative for congestion, rhinorrhea and sore throat.   Eyes:  Negative for visual disturbance.  Respiratory:  Negative for cough and shortness of breath.   Cardiovascular:  Negative for chest pain and leg swelling.  Gastrointestinal:  Negative for abdominal pain, diarrhea, nausea and vomiting.  Genitourinary:  Negative for dysuria.  Musculoskeletal:  Negative for back pain and neck pain.  Skin:  Negative for rash.  Neurological:  Negative for dizziness, light-headedness and headaches.  Hematological:  Does not bruise/bleed easily.  Psychiatric/Behavioral:  Negative for confusion.     Physical Exam Updated Vital Signs BP 107/62   Pulse 92   Temp 98.7 F (37.1 C) (Oral)   Resp 17   SpO2 97%  Physical Exam Vitals and nursing note reviewed.  Constitutional:      General: He is not in acute distress.    Appearance: Normal appearance. He is well-developed.  HENT:     Head: Normocephalic and atraumatic.  Eyes:      General: Scleral icterus present.     Extraocular Movements: Extraocular movements intact.     Conjunctiva/sclera: Conjunctivae normal.     Pupils: Pupils are equal, round, and reactive to light.  Cardiovascular:     Rate and Rhythm: Normal rate and regular rhythm.     Heart sounds: No murmur heard. Pulmonary:     Effort: Pulmonary effort is normal. No respiratory distress.     Breath sounds: Normal breath sounds.  Abdominal:     Palpations: Abdomen is soft.     Tenderness: There is no abdominal tenderness.  Genitourinary:    Rectum: Guaiac result positive.     Comments: Stool black in color.  Heme positive.  No rectal mass. Musculoskeletal:        General: No swelling.     Cervical back: Normal range of motion and neck supple.  Skin:    General: Skin is warm and dry.     Capillary Refill: Capillary refill takes less than 2 seconds.     Coloration: Skin is jaundiced and pale.  Neurological:     General: No focal deficit present.     Mental Status: He is alert and oriented to person, place, and time.     Cranial Nerves: No cranial nerve deficit.     Sensory: No sensory deficit.     Motor: No weakness.  Psychiatric:        Mood and Affect: Mood normal.     ED Results / Procedures / Treatments   Labs (all labs ordered are listed, but only abnormal results are displayed) Labs Reviewed  COMPREHENSIVE METABOLIC PANEL - Abnormal; Notable for the following components:      Result Value   Sodium 134 (*)    Glucose, Bld 132 (*)    BUN 31 (*)    Total Protein 6.0 (*)    AST 13 (*)    All other components within normal limits  CBC WITH DIFFERENTIAL/PLATELET - Abnormal; Notable for the following components:   WBC 11.4 (*)    RBC 2.49 (*)    Hemoglobin 7.4 (*)    HCT 22.5 (*)    RDW 16.4 (*)    nRBC 0.4 (*)    Neutro Abs 8.4 (*)    Abs Immature Granulocytes 0.09 (*)    All other components within normal limits  OCCULT BLOOD X 1 CARD TO LAB, STOOL - Abnormal; Notable for the  following components:   Fecal Occult Bld POSITIVE (*)    All other components within normal limits  LIPASE, BLOOD  POC OCCULT BLOOD, ED  TYPE AND SCREEN  PREPARE RBC (CROSSMATCH)    EKG EKG Interpretation  Date/Time:  Thursday April 27 2022 11:17:28 EST Ventricular Rate:  84 PR Interval:  187 QRS Duration: 135 QT Interval:  437 QTC Calculation: 517 R  Axis:   -57 Text Interpretation: Sinus rhythm Ventricular trigeminy RBBB and LAFB Confirmed by Fredia Sorrow [phone]) on 04/27/2022 11:19:29 AM  Radiology CT Abdomen Pelvis W Contrast  Result Date: 04/27/2022 CLINICAL DATA:  Dark stools.  Acute generalized abdominal pain. EXAM: CT ABDOMEN AND PELVIS WITH CONTRAST TECHNIQUE: Multidetector CT imaging of the abdomen and pelvis was performed using the standard protocol following bolus administration of intravenous contrast. RADIATION DOSE REDUCTION: This exam was performed according to the departmental dose-optimization program which includes automated exposure control, adjustment of the mA and/or kV according to patient size and/or use of iterative reconstruction technique. CONTRAST:  191m OMNIPAQUE IOHEXOL 300 MG/ML  SOLN COMPARISON:  None Available. FINDINGS: Lower chest: No acute abnormality. Hepatobiliary: No focal liver abnormality is seen. No gallstones, gallbladder wall thickening, or biliary dilatation. Pancreas: Unremarkable. No pancreatic ductal dilatation or surrounding inflammatory changes. Spleen: Normal in size without focal abnormality. Adrenals/Urinary Tract: Adrenal glands appear normal. Small bilateral renal cysts are noted for which no further follow-up is required. No hydronephrosis or renal obstruction is noted. Urinary bladder is unremarkable. Stomach/Bowel: Stomach is within normal limits. Appendix appears normal. No evidence of bowel wall thickening, distention, or inflammatory changes. Vascular/Lymphatic: Aortic atherosclerosis. No enlarged abdominal or pelvic lymph  nodes. Reproductive: Prostate is unremarkable. Other: Small fat containing periumbilical hernia. No ascites is noted. Musculoskeletal: No acute or significant osseous findings. IMPRESSION: No acute abnormality seen in the abdomen or pelvis. Small fat containing periumbilical hernia. Aortic Atherosclerosis (ICD10-I70.0). Electronically Signed   By: JMarijo ConceptionM.D.   On: 04/27/2022 13:44   DG Chest Port 1 View  Result Date: 04/27/2022 CLINICAL DATA:  Shortness of breath, cough. EXAM: PORTABLE CHEST 1 VIEW COMPARISON:  June 19, 2017. FINDINGS: Stable cardiomediastinal silhouette. Both lungs are clear. The visualized skeletal structures are unremarkable. IMPRESSION: No active disease. Electronically Signed   By: JMarijo ConceptionM.D.   On: 04/27/2022 11:34    Procedures Procedures    Medications Ordered in ED Medications  0.9 %  sodium chloride infusion ( Intravenous New Bag/Given 04/27/22 1147)  iohexol (OMNIPAQUE) 300 MG/ML solution 100 mL (100 mLs Intravenous Contrast Given 04/27/22 1252)  0.9 %  sodium chloride infusion (0 mL/hr Intravenous Stopped 04/27/22 1609)    ED Course/ Medical Decision Making/ A&P                           Medical Decision Making Amount and/or Complexity of Data Reviewed Labs: ordered. Radiology: ordered.  Risk Prescription drug management.   CRITICAL CARE Performed by: SFredia SorrowTotal critical care time: 60 minutes Critical care time was exclusive of separately billable procedures and treating other patients. Critical care was necessary to treat or prevent imminent or life-threatening deterioration. Critical care was time spent personally by me on the following activities: development of treatment plan with patient and/or surrogate as well as nursing, discussions with consultants, evaluation of patient's response to treatment, examination of patient, obtaining history from patient or surrogate, ordering and performing treatments and interventions,  ordering and review of laboratory studies, ordering and review of radiographic studies, pulse oximetry and re-evaluation of patient's condition.  Patient heme positive.  Initially hemodynamically stable heart rate up in the 90s.  Some atrial fibrillation but rate controlled.  Pressures though only around 1631systolic patient has a history of hypertension.  He is on Xarelto took last dose of Xarelto last evening that is when he normally takes it.  Patient  did not take any blood pressure medicine today.  CT scan of abdomen was done mostly for this concern for the jaundice.  But is complete metabolic panel came back bilirubin was normal LFTs were normal.  Lipase was normal no significant labs.  Hemoglobin however low at 7.4.  Only 1 we had to compare was from like 2022 and that was a normal hemoglobin at that time.  Patient denies any nausea or vomiting.  Discussed with Dr. Almyra Free who is his GI doctor that did his colonoscopy.  He will see him in consultation.  I was trying to get a hold hospitalist for admission we had some several delays on making connection may have been errors on this and.  Then she got a hold of Dr. Marcello Moores.  But shortly before that patient dropped his pressures down to 74 which was a big change he got a 500 cc bolus pressures came up some we ordered O- blood started to transfuse.  We do not have the ability to type and cross here and do not have type and cross blood available here.  Contacted medicine Brimhall Nizhoni ED physician at [facility] is excepted him in ED to ED transfer.  Patient if he does not stabilize out may need admission by critical care.  Carol ankle, take patient to [facility] long ED.  Patient now receiving blood.  Blood pressure up to 92 systolic.  Patient without any significant symptoms and most importantly has not had a large bowel movement here at all so does not appear to be any active hemorrhaging going on at the moment although it could be internal stuff going  on.  Also spoke with the pharmacist at [facility] about consideration for reversal of his Xarelto.  They did not recommend that unless he has to go to pressure agents.  So we will hold off on that for now.  When patient gets to a [facility] long hospital he will need to be typed and crossed for additional units of blood.  Also will need to contact the appropriate service whether that be the hospitalist service or critical care for admission.  Dr. Benson Norway already knows about the patient.    Final Clinical Impression(s) / ED Diagnoses Final diagnoses:  Melena  Gastrointestinal hemorrhage, unspecified gastrointestinal hemorrhage type  Hypotension due to hypovolemia    Rx / DC Orders ED Discharge Orders     None         Fredia Sorrow, MD 04/27/22 1627

## 2022-04-28 ENCOUNTER — Encounter (HOSPITAL_COMMUNITY)
Admission: EM | Disposition: A | Discharge status: Home / Self Care | Payer: Self-pay | Source: Home / Self Care | Attending: Internal Medicine

## 2022-04-28 ENCOUNTER — Inpatient Hospital Stay (HOSPITAL_COMMUNITY): Payer: Medicare Other | Admitting: Anesthesiology

## 2022-04-28 DIAGNOSIS — G4730 Sleep apnea, unspecified: Secondary | ICD-10-CM

## 2022-04-28 DIAGNOSIS — K922 Gastrointestinal hemorrhage, unspecified: Secondary | ICD-10-CM | POA: Diagnosis not present

## 2022-04-28 DIAGNOSIS — D62 Acute posthemorrhagic anemia: Secondary | ICD-10-CM | POA: Diagnosis not present

## 2022-04-28 DIAGNOSIS — J449 Chronic obstructive pulmonary disease, unspecified: Secondary | ICD-10-CM

## 2022-04-28 DIAGNOSIS — Z87891 Personal history of nicotine dependence: Secondary | ICD-10-CM

## 2022-04-28 DIAGNOSIS — I4891 Unspecified atrial fibrillation: Secondary | ICD-10-CM

## 2022-04-28 DIAGNOSIS — K31811 Angiodysplasia of stomach and duodenum with bleeding: Secondary | ICD-10-CM

## 2022-04-28 HISTORY — PX: ESOPHAGOGASTRODUODENOSCOPY (EGD) WITH PROPOFOL: SHX5813

## 2022-04-28 HISTORY — PX: HOT HEMOSTASIS: SHX5433

## 2022-04-28 LAB — CBC
HCT: 25.5 % — ABNORMAL LOW (ref 39.0–52.0)
HCT: 24.3 % — ABNORMAL LOW (ref 39.0–52.0)
Hemoglobin: 8.3 g/dL — ABNORMAL LOW (ref 13.0–17.0)
Hemoglobin: 8 g/dL — ABNORMAL LOW (ref 13.0–17.0)
MCH: 30.2 pg (ref 26.0–34.0)
MCH: 30.9 pg (ref 26.0–34.0)
MCHC: 32.5 g/dL (ref 30.0–36.0)
MCHC: 32.9 g/dL (ref 30.0–36.0)
MCV: 92.7 fL (ref 80.0–100.0)
MCV: 93.8 fL (ref 80.0–100.0)
Platelets: 145 10*3/uL — ABNORMAL LOW (ref 150–400)
Platelets: 150 10*3/uL (ref 150–400)
RBC: 2.75 MIL/uL — ABNORMAL LOW (ref 4.22–5.81)
RBC: 2.59 MIL/uL — ABNORMAL LOW (ref 4.22–5.81)
RDW: 16 % — ABNORMAL HIGH (ref 11.5–15.5)
RDW: 16.4 % — ABNORMAL HIGH (ref 11.5–15.5)
WBC: 9.6 10*3/uL (ref 4.0–10.5)
WBC: 10.2 10*3/uL (ref 4.0–10.5)
nRBC: 0 % (ref 0.0–0.2)
nRBC: 0.2 % (ref 0.0–0.2)

## 2022-04-28 LAB — MRSA NEXT GEN BY PCR, NASAL: MRSA by PCR Next Gen: NOT DETECTED

## 2022-04-28 LAB — BLOOD PRODUCT ORDER (VERBAL) VERIFICATION

## 2022-04-28 SURGERY — ESOPHAGOGASTRODUODENOSCOPY (EGD) WITH PROPOFOL
Anesthesia: Monitor Anesthesia Care

## 2022-04-28 MED ORDER — ALBUTEROL SULFATE (2.5 MG/3ML) 0.083% IN NEBU: 2.5000 mg | INHALATION_SOLUTION | RESPIRATORY_TRACT | Status: DC | PRN

## 2022-04-28 MED ORDER — DEXMEDETOMIDINE HCL IN NACL 80 MCG/20ML IV SOLN
INTRAVENOUS | Status: DC | PRN
  Administered 2022-04-28: 8 ug via BUCCAL

## 2022-04-28 MED ORDER — PROPOFOL 10 MG/ML IV BOLUS
INTRAVENOUS | Status: DC | PRN
  Administered 2022-04-28 (×2): 20 mg via INTRAVENOUS
  Administered 2022-04-28 (×2): 40 mg via INTRAVENOUS
  Administered 2022-04-28: 50 mg via INTRAVENOUS

## 2022-04-28 MED ORDER — ROSUVASTATIN CALCIUM 20 MG PO TABS
20.0000 mg | ORAL_TABLET | Freq: Every day | ORAL | Status: DC
  Administered 2022-04-28 – 2022-04-30 (×3): 20 mg via ORAL
  Filled 2022-04-28 (×3): qty 1

## 2022-04-28 MED ORDER — LIDOCAINE HCL (CARDIAC) PF 100 MG/5ML IV SOSY
PREFILLED_SYRINGE | INTRAVENOUS | Status: DC | PRN
  Administered 2022-04-28: 100 mg via INTRAVENOUS

## 2022-04-28 MED ORDER — SODIUM CHLORIDE 0.9 % IV SOLN: INTRAVENOUS | Status: DC

## 2022-04-28 SURGICAL SUPPLY — 15 items

## 2022-04-28 NOTE — Op Note (Signed)
[facility] Patient Name: Derrick Alexander Procedure Date: 04/28/2022 MRN: [id number] Attending MD: Carol Ada , MD, [id number] Date of Birth: Feb 07, 1946 CSN: [id number] Age: 76 Admit Type: Inpatient Procedure:                Upper GI endoscopy Indications:              Melena Providers:                Carol Ada, MD, Carlyn Reichert, RN, Fransico Setters                            [place], Technician Referring MD:              Medicines:                Propofol per Anesthesia Complications:            No immediate complications. Estimated Blood Loss:     Estimated blood loss: none. Procedure:                Pre-Anesthesia Assessment:                           - Prior to the procedure, a History and Physical                            was performed, and patient medications and                            allergies were reviewed. The patient's tolerance of                            previous anesthesia was also reviewed. The risks                            and benefits of the procedure and the sedation                            options and risks were discussed with the patient.                            All questions were answered, and informed consent                            was obtained. Prior Anticoagulants: The patient has                            taken Xarelto (rivaroxaban), last dose was 2 days                            prior to procedure. ASA Grade Assessment: III - A                            patient with severe systemic disease. After  reviewing the risks and benefits, the patient was                            deemed in satisfactory condition to undergo the                            procedure.                           - Sedation was administered by an anesthesia                            professional. Deep sedation was attained.                           After obtaining informed consent, the endoscope was                             passed under direct vision. Throughout the                            procedure, the patient's blood pressure, pulse, and                            oxygen saturations were monitored continuously. The                            [id number]-[id number] ([id number]) Olympus endoscope was introduced                            through the mouth, and advanced to the second part                            of duodenum. The upper GI endoscopy was                            accomplished without difficulty. The patient                            tolerated the procedure well. Scope In: Scope Out: Findings:      The esophagus was normal.      Three small angiodysplastic lesions with bleeding were found in the       gastric fundus and in the gastric body. Coagulation for tissue       destruction using monopolar probe was successful.      The examined duodenum was normal.      In the gastric lumen there was evidence of three small AVMs. Two       exhibited very minor bleeding. All the lesions were ablated with APC. No       other suspicious lesions were noted in the upper GI tract. Impression:               - Normal esophagus.                           - Three bleeding  angiodysplastic lesions in the                            stomach. Treated with a monopolar probe.                           - Normal examined duodenum.                           - No specimens collected. Moderate Sedation:      Not Applicable - Patient had care per Anesthesia. Recommendation:           - Patient has a contact number available for                            emergencies. The signs and symptoms of potential                            delayed complications were discussed with the                            patient. Return to normal activities tomorrow.                            Written discharge instructions were provided to the                            patient.                           - Resume previous diet.                            - Continue present medications.                           - Advance to a regular diet.                           - Okay to restart Xarelto.                           - Follow HGB and transfuse as necessary.                           - If bleeding persists or worsens, a repeat                            colonoscopy is in order.                           - [facility] will cover this weekend. Procedure Code(s):        --- Professional ---                           [phone], Esophagogastroduodenoscopy, flexible,  transoral; with control of bleeding, any method Diagnosis Code(s):        --- Professional ---                           K31.811, Angiodysplasia of stomach and duodenum                            with bleeding                           K92.1, Melena (includes Hematochezia) CPT copyright 2022 [employer]. All rights reserved. The codes documented in this report are preliminary and upon coder review may  be revised to meet current compliance requirements. Carol Ada, MD Carol Ada, MD 04/28/2022 11:33:51 AM This report has been signed electronically. Number of Addenda: 0

## 2022-04-28 NOTE — Interval H&P Note (Signed)
History and Physical Interval Note:  04/28/2022 10:57 AM  Derrick Alexander  has presented today for surgery, with the diagnosis of GI bleed.  The various methods of treatment have been discussed with the patient and family. After consideration of risks, benefits and other options for treatment, the patient has consented to  Procedure(s): ESOPHAGOGASTRODUODENOSCOPY (EGD) WITH PROPOFOL (N/A) as a surgical intervention.  The patient's history has been reviewed, patient examined, no change in status, stable for surgery.  I have reviewed the patient's chart and labs.  Questions were answered to the patient's satisfaction.     [name]

## 2022-04-28 NOTE — Progress Notes (Signed)
PROGRESS NOTE    Derrick Alexander  EQA:[id number] DOB: Jan 26, 1946 DOA: 04/27/2022 PCP: [name], MD    Brief Narrative:  76 year old male admitted to the hospital with 2-week history of melena.  Chronically on Xarelto for atrial fibrillation.  Noted to be hypotensive after arriving to the emergency room.  Resuscitated with IV fluids and blood products.  Seen by GI and underwent EGD that showed 3 gastric AVMs.  This was treated with APC.   Assessment & Plan:   Principal Problem:   Acute upper GI bleed Active Problems:   QT prolongation   Atrial fibrillation (HCC)   Chronic obstructive pulmonary disease (HCC)   Essential hypertension   Acute blood loss anemia   Leukocytosis   Acute upper GI bleeding -Status post endoscopy -Noted to have 3 gastric AVMs that were bleeding, treated with APC -Continue PPI -Advance diet  Acute blood loss anemia -Status posttransfusion of 3unit PRBC -Hemoglobin down to 7.2, improved to 8.3 after transfusion -Continue to monitor  Hypotension -Initially concerns for hemorrhagic shock -Blood pressures difficult to monitor with blood pressure cuff, patient had arterial line placed for more accurate readings -He has received IV fluids and blood products -Continue to hold antihypertensive  Persistent atrial fibrillation -Status post ablation in 08/2020 -CHA2DS2-VASc 4 -Currently in sinus rhythm -Does not appear to be on any chronic rate control or rhythm control -Chronically on Xarelto, held for GI bleed -Plan on resuming Xarelto tomorrow if hemoglobin remained stable  OSA -Continue CPAP  COPD -No shortness of breath or wheezing at this time -Resume inhaled steroids  Hypertension -Continue to hold irbesartan for now -Resume as blood pressure will allow  Hyperlipidemia -Continue statin   DVT prophylaxis: SCDs Start: 04/27/22 2314  Code Status: Full code Family Communication: Wife and daughter at the bedside Disposition Plan:  Status is: Inpatient Remains inpatient appropriate because: Anticipate discharge home if hemoglobin remains stable     Consultants:  Gastroenterology  Procedures:  Endoscopy  Antimicrobials:      Subjective: Denies any abdominal pain.  No shortness of breath.  No nausea or vomiting.  Had a dark bowel movement prior to my visit.  Had endoscopy earlier today.  Objective: Vitals:   04/28/22 1500 04/28/22 1600 04/28/22 1655 04/28/22 1700  BP:   97/74   Pulse: 90 87 87 88  Resp: (!) 25 (!) '22 14 18  '$ Temp:      TempSrc:      SpO2: 100% 98% 99% 99%  Weight:      Height:        Intake/Output Summary (Last 24 hours) at 04/28/2022 1903 Last data filed at 04/28/2022 1415 Gross per 24 hour  Intake 2082.97 ml  Output 850 ml  Net 1232.97 ml   Filed Weights   04/27/22 1729 04/28/22 0011  Weight: 119 kg 115.8 kg    Examination:  General exam: Appears calm and comfortable  Respiratory system: Clear to auscultation. Respiratory effort normal. Cardiovascular system: S1 & S2 heard, RRR. No JVD, murmurs, rubs, gallops or clicks. No pedal edema. Gastrointestinal system: Abdomen is nondistended, soft and nontender. No organomegaly or masses felt. Normal bowel sounds heard. Central nervous system: Alert and oriented. No focal neurological deficits. Extremities: Symmetric 5 x 5 power. Skin: No rashes, lesions or ulcers Psychiatry: Judgement and insight appear normal. Mood & affect appropriate.     Data Reviewed: I have personally reviewed following labs and imaging studies  CBC: Recent Labs  Lab 04/27/22 1116 04/27/22 1726 04/27/22 2324 04/28/22  8676 04/28/22 1811  WBC 11.4*  --  9.9 9.6 10.2  NEUTROABS 8.4*  --   --   --   --   HGB 7.4* 7.5* 7.2* 8.3* 8.0*  HCT 22.5* 23.0* 22.2* 25.5* 24.3*  MCV 90.4  --  93.3 92.7 93.8  PLT 183  --  147* 145* 720   Basic Metabolic Panel: Recent Labs  Lab 04/27/22 1116 04/27/22 2324  NA 134*  --   K 4.3  --   CL 100  --   CO2  27  --   GLUCOSE 132*  --   BUN 31*  --   CREATININE 1.00  --   CALCIUM 9.4  --   MG  --  1.9   GFR: Estimated Creatinine Clearance: 74 mL/min (by C-G formula based on SCr of 1 mg/dL). Liver Function Tests: Recent Labs  Lab 04/27/22 1116  AST 13*  ALT 14  ALKPHOS 44  BILITOT 0.4  PROT 6.0*  ALBUMIN 4.1   Recent Labs  Lab 04/27/22 1116  LIPASE 18   No results for input(s): "AMMONIA" in the last 168 hours. Coagulation Profile: No results for input(s): "INR", "PROTIME" in the last 168 hours. Cardiac Enzymes: No results for input(s): "CKTOTAL", "CKMB", "CKMBINDEX", "TROPONINI" in the last 168 hours. BNP (last 3 results) No results for input(s): "PROBNP" in the last 8760 hours. HbA1C: No results for input(s): "HGBA1C" in the last 72 hours. CBG: No results for input(s): "GLUCAP" in the last 168 hours. Lipid Profile: No results for input(s): "CHOL", "HDL", "LDLCALC", "TRIG", "CHOLHDL", "LDLDIRECT" in the last 72 hours. Thyroid Function Tests: No results for input(s): "TSH", "T4TOTAL", "FREET4", "T3FREE", "THYROIDAB" in the last 72 hours. Anemia Panel: No results for input(s): "VITAMINB12", "FOLATE", "FERRITIN", "TIBC", "IRON", "RETICCTPCT" in the last 72 hours. Sepsis Labs: No results for input(s): "PROCALCITON", "LATICACIDVEN" in the last 168 hours.  Recent Results (from the past 240 hour(s))  MRSA Next Gen by PCR, Nasal     Status: None   Collection Time: 04/28/22 12:00 AM   Specimen: Nasal Mucosa; Nasal Swab  Result Value Ref Range Status   MRSA by PCR Next Gen NOT DETECTED NOT DETECTED Final    Comment: (NOTE) The GeneXpert MRSA Assay (FDA approved for NASAL specimens only), is one component of a comprehensive MRSA colonization surveillance program. It is not intended to diagnose MRSA infection nor to guide or monitor treatment for MRSA infections. Test performance is not FDA approved in patients less than 7 years old. Performed at [facility], [place] [address] [place], [place] [address]          Radiology Studies: CT Abdomen Pelvis W Contrast  Result Date: 04/27/2022 CLINICAL DATA:  Dark stools.  Acute generalized abdominal pain. EXAM: CT ABDOMEN AND PELVIS WITH CONTRAST TECHNIQUE: Multidetector CT imaging of the abdomen and pelvis was performed using the standard protocol following bolus administration of intravenous contrast. RADIATION DOSE REDUCTION: This exam was performed according to the departmental dose-optimization program which includes automated exposure control, adjustment of the mA and/or kV according to patient size and/or use of iterative reconstruction technique. CONTRAST:  111m OMNIPAQUE IOHEXOL 300 MG/ML  SOLN COMPARISON:  None Available. FINDINGS: Lower chest: No acute abnormality. Hepatobiliary: No focal liver abnormality is seen. No gallstones, gallbladder wall thickening, or biliary dilatation. Pancreas: Unremarkable. No pancreatic ductal dilatation or surrounding inflammatory changes. Spleen: Normal in size without focal abnormality. Adrenals/Urinary Tract: Adrenal glands appear normal. Small bilateral renal cysts are noted for  which no further follow-up is required. No hydronephrosis or renal obstruction is noted. Urinary bladder is unremarkable. Stomach/Bowel: Stomach is within normal limits. Appendix appears normal. No evidence of bowel wall thickening, distention, or inflammatory changes. Vascular/Lymphatic: Aortic atherosclerosis. No enlarged abdominal or pelvic lymph nodes. Reproductive: Prostate is unremarkable. Other: Small fat containing periumbilical hernia. No ascites is noted. Musculoskeletal: No acute or significant osseous findings. IMPRESSION: No acute abnormality seen in the abdomen or pelvis. Small fat containing periumbilical hernia. Aortic Atherosclerosis (ICD10-I70.0). Electronically Signed   By: Marijo Conception M.D.   On: 04/27/2022 13:44   DG Chest Port 1 View  Result Date:  04/27/2022 CLINICAL DATA:  Shortness of breath, cough. EXAM: PORTABLE CHEST 1 VIEW COMPARISON:  June 19, 2017. FINDINGS: Stable cardiomediastinal silhouette. Both lungs are clear. The visualized skeletal structures are unremarkable. IMPRESSION: No active disease. Electronically Signed   By: Marijo Conception M.D.   On: 04/27/2022 11:34        Scheduled Meds:  Chlorhexidine Gluconate Cloth  6 each Topical Daily   latanoprost  1 drop Both Eyes QHS   pantoprazole (PROTONIX) IV  40 mg Intravenous Q12H   Continuous Infusions:   LOS: 1 day    Time spent: 68mns    JKathie Dike MD [facility]   If 7PM-7AM, please contact night-coverage [email or web address]  04/28/2022, 7:03 PM

## 2022-04-28 NOTE — Anesthesia Preprocedure Evaluation (Addendum)
Anesthesia Evaluation  Patient identified by MRN, date of birth, ID band Patient awake    Reviewed: Allergy & Precautions, NPO status , Patient's Chart, lab work & pertinent test results  Airway Mallampati: I  TM Distance: >3 FB Neck ROM: Full    Dental  (+) Edentulous Upper, Edentulous Lower, Dental Advisory Given   Pulmonary sleep apnea , COPD,  COPD inhaler, former smoker   Pulmonary exam normal breath sounds clear to auscultation       Cardiovascular hypertension, + DOE  Normal cardiovascular exam+ dysrhythmias Atrial Fibrillation + Valvular Problems/Murmurs (mod AS) AS  Rhythm:Regular Rate:Normal  LBBB  TTE 2023 1. Left ventricular ejection fraction, by estimation, is 60 to 65%. The  left ventricle has normal function. The left ventricle has no regional  wall motion abnormalities. There is moderate left ventricular hypertrophy.  Left ventricular diastolic  parameters are consistent with Grade II diastolic dysfunction  (pseudonormalization). Elevated left ventricular end-diastolic pressure.  The E/e' is 71.   2. Right ventricular systolic function is normal. The right ventricular  size is normal. There is mildly elevated pulmonary artery systolic  pressure. The estimated right ventricular systolic pressure is 62.9 mmHg.   3. The mitral valve is abnormal. Trivial mitral valve regurgitation.   4. The aortic valve is calcified. Aortic valve regurgitation is not  visualized. Moderate aortic valve stenosis. Aortic valve area, by VTI  measures 1.06 cm. Aortic valve mean gradient measures 26.4 mmHg. Aortic  valve Vmax measures 3.62 m/s.   5. The inferior vena cava is normal in size with <50% respiratory  variability, suggesting right atrial pressure of 8 mmHg.   Stress Test 2017  The left ventricular ejection fraction is normal (55-65%).  Nuclear stress EF: 57%.  There was no ST segment deviation noted during stress.  The  study is normal.     Neuro/Psych negative neurological ROS  negative psych ROS   GI/Hepatic negative GI ROS, Neg liver ROS,,,  Endo/Other  negative endocrine ROS    Renal/GU negative Renal ROS  negative genitourinary   Musculoskeletal negative musculoskeletal ROS (+)    Abdominal   Peds  Hematology  (+) Blood dyscrasia (on xarelto), anemia Lab Results      Component                Value               Date                      WBC                      9.6                 04/28/2022                HGB                      8.3 (L)             04/28/2022                HCT                      25.5 (L)            04/28/2022                MCV  92.7                04/28/2022                PLT                      145 (L)             04/28/2022              Anesthesia Other Findings   Reproductive/Obstetrics                             Anesthesia Physical Anesthesia Plan  ASA: 3  Anesthesia Plan: MAC   Post-op Pain Management:    Induction: Intravenous  PONV Risk Score and Plan: Propofol infusion and Treatment may vary due to age or medical condition  Airway Management Planned: Natural Airway  Additional Equipment:   Intra-op Plan:   Post-operative Plan:   Informed Consent: I have reviewed the patients History and Physical, chart, labs and discussed the procedure including the risks, benefits and alternatives for the proposed anesthesia with the patient or authorized representative who has indicated his/her understanding and acceptance.     Dental advisory given  Plan Discussed with: CRNA  Anesthesia Plan Comments:        Anesthesia Quick Evaluation

## 2022-04-28 NOTE — Transfer of Care (Signed)
Immediate Anesthesia Transfer of Care Note  Patient: Derrick Alexander  Procedure(s) Performed: ESOPHAGOGASTRODUODENOSCOPY (EGD) WITH PROPOFOL HOT HEMOSTASIS (ARGON PLASMA COAGULATION/BICAP)  Patient Location: Endoscopy Unit  Anesthesia Type:MAC  Level of Consciousness: drowsy  Airway & Oxygen Therapy: Patient Spontanous Breathing and Patient connected to face mask  Post-op Assessment: Report given to RN and Post -op Vital signs reviewed and stable  Post vital signs: Reviewed and stable  Last Vitals:  Vitals Value Taken Time  BP    Temp    Pulse    Resp    SpO2      Last Pain:  Vitals:   04/28/22 1042  TempSrc: Temporal  PainSc: 0-No pain         Complications: No notable events documented.

## 2022-04-28 NOTE — Progress Notes (Signed)
An USGPIV (ultrasound guided PIV) has been placed for short-term vasopressor infusion. A correctly placed ivWatch must be used when administering Vasopressors. Should this treatment be needed beyond 72 hours, central line access should be obtained.  It will be the responsibility of the bedside nurse to follow best practice to prevent extravasations.   ?

## 2022-04-29 ENCOUNTER — Inpatient Hospital Stay (HOSPITAL_COMMUNITY): Payer: Medicare Other

## 2022-04-29 DIAGNOSIS — D62 Acute posthemorrhagic anemia: Secondary | ICD-10-CM | POA: Diagnosis not present

## 2022-04-29 DIAGNOSIS — K922 Gastrointestinal hemorrhage, unspecified: Secondary | ICD-10-CM | POA: Diagnosis not present

## 2022-04-29 DIAGNOSIS — I4891 Unspecified atrial fibrillation: Secondary | ICD-10-CM | POA: Diagnosis not present

## 2022-04-29 LAB — TYPE AND SCREEN
ABO/RH(D): O POS
Antibody Screen: NEGATIVE
Unit division: 0

## 2022-04-29 LAB — BLOOD GAS, ARTERIAL
Acid-base deficit: 12.8 mmol/L — ABNORMAL HIGH (ref 0.0–2.0)
Acid-base deficit: 1.1 mmol/L (ref 0.0–2.0)
Bicarbonate: 9.5 mmol/L — ABNORMAL LOW (ref 20.0–28.0)
Bicarbonate: 22.4 mmol/L (ref 20.0–28.0)
Drawn by: 25770
Drawn by: 25770
O2 Saturation: 99.5 %
O2 Saturation: 100 %
Patient temperature: 35.9
Patient temperature: 37
pCO2 arterial: <18 mmHg — CL (ref 32–48)
pCO2 arterial: 32 mmHg (ref 32–48)
pH, Arterial: 7.4 (ref 7.35–7.45)
pH, Arterial: 7.45 (ref 7.35–7.45)
pO2, Arterial: 78 mmHg — ABNORMAL LOW (ref 83–108)
pO2, Arterial: 110 mmHg — ABNORMAL HIGH (ref 83–108)

## 2022-04-29 LAB — CBC
HCT: 25.3 % — ABNORMAL LOW (ref 39.0–52.0)
HCT: 26.5 % — ABNORMAL LOW (ref 39.0–52.0)
Hemoglobin: 8.4 g/dL — ABNORMAL LOW (ref 13.0–17.0)
Hemoglobin: 8.4 g/dL — ABNORMAL LOW (ref 13.0–17.0)
MCH: 31 pg (ref 26.0–34.0)
MCH: 31.3 pg (ref 26.0–34.0)
MCHC: 33.2 g/dL (ref 30.0–36.0)
MCHC: 31.7 g/dL (ref 30.0–36.0)
MCV: 93.4 fL (ref 80.0–100.0)
MCV: 98.9 fL (ref 80.0–100.0)
Platelets: 148 10*3/uL — ABNORMAL LOW (ref 150–400)
Platelets: 141 10*3/uL — ABNORMAL LOW (ref 150–400)
RBC: 2.71 MIL/uL — ABNORMAL LOW (ref 4.22–5.81)
RBC: 2.68 MIL/uL — ABNORMAL LOW (ref 4.22–5.81)
RDW: 16.5 % — ABNORMAL HIGH (ref 11.5–15.5)
RDW: 17 % — ABNORMAL HIGH (ref 11.5–15.5)
WBC: 12 10*3/uL — ABNORMAL HIGH (ref 4.0–10.5)
WBC: 11.1 10*3/uL — ABNORMAL HIGH (ref 4.0–10.5)
nRBC: 0.2 % (ref 0.0–0.2)
nRBC: 0.2 % (ref 0.0–0.2)

## 2022-04-29 LAB — BASIC METABOLIC PANEL
Anion gap: 8 (ref 5–15)
Anion gap: 7 (ref 5–15)
BUN: 22 mg/dL (ref 8–23)
BUN: 18 mg/dL (ref 8–23)
CO2: 20 mmol/L — ABNORMAL LOW (ref 22–32)
CO2: 20 mmol/L — ABNORMAL LOW (ref 22–32)
Calcium: 8.8 mg/dL — ABNORMAL LOW (ref 8.9–10.3)
Calcium: 9.1 mg/dL (ref 8.9–10.3)
Chloride: 108 mmol/L (ref 98–111)
Chloride: 110 mmol/L (ref 98–111)
Creatinine, Ser: 0.94 mg/dL (ref 0.61–1.24)
Creatinine, Ser: 0.88 mg/dL (ref 0.61–1.24)
GFR, Estimated: >60 mL/min (ref >60)
GFR, Estimated: >60 mL/min (ref >60)
Glucose, Bld: 140 mg/dL — ABNORMAL HIGH (ref 70–99)
Glucose, Bld: 113 mg/dL — ABNORMAL HIGH (ref 70–99)
Potassium: 3.7 mmol/L (ref 3.5–5.1)
Potassium: 4.6 mmol/L (ref 3.5–5.1)
Sodium: 136 mmol/L (ref 135–145)
Sodium: 137 mmol/L (ref 135–145)

## 2022-04-29 LAB — BPAM RBC
Blood Product Expiration Date: 202312152359
ISSUE DATE / TIME: 202312071600
Unit Type and Rh: 9500

## 2022-04-29 LAB — MAGNESIUM: Magnesium: 2.3 mg/dL (ref 1.7–2.4)

## 2022-04-29 LAB — BRAIN NATRIURETIC PEPTIDE: B Natriuretic Peptide: 393.6 pg/mL — ABNORMAL HIGH (ref 0.0–100.0)

## 2022-04-29 MED ORDER — IPRATROPIUM BROMIDE 0.02 % IN SOLN
0.5000 mg | Freq: Two times a day (BID) | RESPIRATORY_TRACT | Status: DC
  Administered 2022-04-29 – 2022-04-30 (×2): 0.5 mg via RESPIRATORY_TRACT
  Filled 2022-04-29 (×2): qty 2.5

## 2022-04-29 MED ORDER — IPRATROPIUM BROMIDE 0.02 % IN SOLN
0.5000 mg | Freq: Four times a day (QID) | RESPIRATORY_TRACT | Status: DC
  Administered 2022-04-29: 0.5 mg via RESPIRATORY_TRACT
  Filled 2022-04-29: qty 2.5

## 2022-04-29 MED ORDER — POTASSIUM CHLORIDE CRYS ER 20 MEQ PO TBCR
40.0000 meq | EXTENDED_RELEASE_TABLET | ORAL | Status: AC
  Administered 2022-04-29 (×2): 40 meq via ORAL
  Filled 2022-04-29 (×2): qty 2

## 2022-04-29 MED ORDER — LEVALBUTEROL HCL 0.63 MG/3ML IN NEBU
0.6300 mg | INHALATION_SOLUTION | Freq: Four times a day (QID) | RESPIRATORY_TRACT | Status: DC
  Administered 2022-04-29: 0.63 mg via RESPIRATORY_TRACT
  Filled 2022-04-29: qty 3

## 2022-04-29 MED ORDER — GUAIFENESIN ER 600 MG PO TB12
600.0000 mg | ORAL_TABLET | Freq: Two times a day (BID) | ORAL | Status: DC
  Administered 2022-04-29 – 2022-04-30 (×3): 600 mg via ORAL
  Filled 2022-04-29 (×3): qty 1

## 2022-04-29 MED ORDER — RIVAROXABAN 20 MG PO TABS
20.0000 mg | ORAL_TABLET | Freq: Every day | ORAL | Status: DC
  Administered 2022-04-29 – 2022-04-30 (×2): 20 mg via ORAL
  Filled 2022-04-29 (×3): qty 1

## 2022-04-29 MED ORDER — LEVALBUTEROL HCL 0.63 MG/3ML IN NEBU
0.6300 mg | INHALATION_SOLUTION | Freq: Two times a day (BID) | RESPIRATORY_TRACT | Status: DC
  Administered 2022-04-29 – 2022-04-30 (×2): 0.63 mg via RESPIRATORY_TRACT
  Filled 2022-04-29 (×2): qty 3

## 2022-04-29 MED ORDER — BUDESONIDE 0.25 MG/2ML IN SUSP
0.2500 mg | Freq: Two times a day (BID) | RESPIRATORY_TRACT | Status: DC
  Administered 2022-04-29 – 2022-04-30 (×3): 0.25 mg via RESPIRATORY_TRACT
  Filled 2022-04-29 (×3): qty 2

## 2022-04-29 NOTE — Progress Notes (Signed)
Notified Lab that ABG being sent for analysis. 

## 2022-04-29 NOTE — Progress Notes (Signed)
PROGRESS NOTE    Derrick Alexander  JTT:[id number] DOB: [birth year] DOA: 04/27/2022 PCP: [name], MD    Brief Narrative:  76 year old male admitted to the hospital with 2-week history of melena.  Chronically on Xarelto for atrial fibrillation.  Noted to be hypotensive after arriving to the emergency room.  Resuscitated with IV fluids and blood products.  Seen by GI and underwent EGD that showed 3 gastric AVMs.  This was treated with APC.   Assessment & Plan:   Principal Problem:   Acute upper GI bleed Active Problems:   QT prolongation   Atrial fibrillation (HCC)   Chronic obstructive pulmonary disease (HCC)   Essential hypertension   Acute blood loss anemia   Leukocytosis   Acute upper GI bleeding -Status post endoscopy -Noted to have 3 gastric AVMs that were bleeding, treated with APC -Continue PPI -Advance diet  Acute blood loss anemia -Status posttransfusion of 3unit PRBC -Hemoglobin down to 7.2, improved to 8.3 after transfusion and has remained stable -Continue to monitor  Hypotension -Initially concerns for hemorrhagic shock -Blood pressures difficult to monitor with blood pressure cuff, patient had arterial line placed for more accurate readings -He has received IV fluids and blood products -Continue to hold antihypertensive -Overall blood pressures have been stable  Persistent atrial fibrillation -Status post ablation in 08/2020 -CHA2DS2-VASc 4 -Currently in sinus rhythm -Does not appear to be on any chronic rate control or rhythm control -EKG showing sinus rhythm with PVCs -Keep potassium greater than 4, magnesium greater than 2 -Chronically on Xarelto, held for GI bleed -Will resume Xarelto today since hemoglobin has been stable  OSA -Continue CPAP  COPD -Continue on inhaled steroids, bronchodilators -Since he does have significant cough, will recheck chest x-ray -He does seem more sleepy, will check ABG  Hypertension -Continue to hold irbesartan  for now -Resume as blood pressure will allow  Hyperlipidemia -Continue statin   DVT prophylaxis: SCDs Start: 04/27/22 2314 rivaroxaban (XARELTO) tablet 20 mg  Code Status: Full code Family Communication: Wife at the bedside Disposition Plan: Status is: Inpatient Remains inpatient appropriate because: Anticipate discharge home if hemoglobin remains stable     Consultants:  Gastroenterology  Procedures:  Endoscopy  Antimicrobials:      Subjective: Had a bowel movement last night that was reportedly more brown in color.  Did not have any abdominal pain.  Did not sleep well with CPAP last night, felt it was not giving him enough pressure.  Does seem more sleepy today.  Continues to have productive cough.  Objective: Vitals:   04/29/22 0500 04/29/22 0600 04/29/22 0700 04/29/22 0846  BP:      Pulse: 78 (!) 53 (!) 101   Resp: (!) 27 18 (!) 23   Temp:   98.4 F (36.9 C) 97.6 F (36.4 C)  TempSrc:   Oral Oral  SpO2: 95% 91% 98%   Weight:      Height:        Intake/Output Summary (Last 24 hours) at 04/29/2022 1200 Last data filed at 04/29/2022 0600 Gross per 24 hour  Intake 280.66 ml  Output 1650 ml  Net -1369.34 ml   Filed Weights   04/27/22 1729 04/28/22 0011  Weight: 119 kg 115.8 kg    Examination:  General exam: Appears calm and comfortable  Respiratory system: Clear to auscultation. Respiratory effort normal. Cardiovascular system: S1 & S2 heard, RRR. No JVD, murmurs, rubs, gallops or clicks.  Gastrointestinal system: Abdomen is nondistended, soft and nontender. No organomegaly or masses  felt. Normal bowel sounds heard. Central nervous system: Alert and oriented. No focal neurological deficits. Extremities: Symmetric 5 x 5 power, mild pedal edema bilaterally Skin: No rashes, lesions or ulcers Psychiatry: Judgement and insight appear normal. Mood & affect appropriate.     Data Reviewed: I have personally reviewed following labs and imaging  studies  CBC: Recent Labs  Lab 04/27/22 1116 04/27/22 1726 04/27/22 2324 04/28/22 0620 04/28/22 1811 04/29/22 0500  WBC 11.4*  --  9.9 9.6 10.2 12.0*  NEUTROABS 8.4*  --   --   --   --   --   HGB 7.4* 7.5* 7.2* 8.3* 8.0* 8.4*  HCT 22.5* 23.0* 22.2* 25.5* 24.3* 25.3*  MCV 90.4  --  93.3 92.7 93.8 93.4  PLT 183  --  147* 145* 150 932*   Basic Metabolic Panel: Recent Labs  Lab 04/27/22 1116 04/27/22 2324 04/29/22 0500  NA 134*  --  136  K 4.3  --  3.7  CL 100  --  108  CO2 27  --  20*  GLUCOSE 132*  --  140*  BUN 31*  --  22  CREATININE 1.00  --  0.94  CALCIUM 9.4  --  8.8*  MG  --  1.9 2.3   GFR: Estimated Creatinine Clearance: 78.8 mL/min (by C-G formula based on SCr of 0.94 mg/dL). Liver Function Tests: Recent Labs  Lab 04/27/22 1116  AST 13*  ALT 14  ALKPHOS 44  BILITOT 0.4  PROT 6.0*  ALBUMIN 4.1   Recent Labs  Lab 04/27/22 1116  LIPASE 18   No results for input(s): "AMMONIA" in the last 168 hours. Coagulation Profile: No results for input(s): "INR", "PROTIME" in the last 168 hours. Cardiac Enzymes: No results for input(s): "CKTOTAL", "CKMB", "CKMBINDEX", "TROPONINI" in the last 168 hours. BNP (last 3 results) No results for input(s): "PROBNP" in the last 8760 hours. HbA1C: No results for input(s): "HGBA1C" in the last 72 hours. CBG: No results for input(s): "GLUCAP" in the last 168 hours. Lipid Profile: No results for input(s): "CHOL", "HDL", "LDLCALC", "TRIG", "CHOLHDL", "LDLDIRECT" in the last 72 hours. Thyroid Function Tests: No results for input(s): "TSH", "T4TOTAL", "FREET4", "T3FREE", "THYROIDAB" in the last 72 hours. Anemia Panel: No results for input(s): "VITAMINB12", "FOLATE", "FERRITIN", "TIBC", "IRON", "RETICCTPCT" in the last 72 hours. Sepsis Labs: No results for input(s): "PROCALCITON", "LATICACIDVEN" in the last 168 hours.  Recent Results (from the past 240 hour(s))  MRSA Next Gen by PCR, Nasal     Status: None   Collection  Time: 04/28/22 12:00 AM   Specimen: Nasal Mucosa; Nasal Swab  Result Value Ref Range Status   MRSA by PCR Next Gen NOT DETECTED NOT DETECTED Final    Comment: (NOTE) The GeneXpert MRSA Assay (FDA approved for NASAL specimens only), is one component of a comprehensive MRSA colonization surveillance program. It is not intended to diagnose MRSA infection nor to guide or monitor treatment for MRSA infections. Test performance is not FDA approved in patients less than 43 years old. Performed at [facility], [place] [address], [place], [place] [address]          Radiology Studies: DG CHEST PORT 1 VIEW  Result Date: 04/29/2022 CLINICAL DATA:  Cough.  History of upper GI bleed. EXAM: PORTABLE CHEST 1 VIEW COMPARISON:  04/27/2022 and older exams. FINDINGS: Cardiac silhouette is normal in size. No mediastinal or hilar masses. Clear lungs.  No convincing pleural effusion.  No pneumothorax. Skeletal structures are  grossly intact. IMPRESSION: No active disease. Electronically Signed   By: Lajean Manes M.D.   On: 04/29/2022 11:46   CT Abdomen Pelvis W Contrast  Result Date: 04/27/2022 CLINICAL DATA:  Dark stools.  Acute generalized abdominal pain. EXAM: CT ABDOMEN AND PELVIS WITH CONTRAST TECHNIQUE: Multidetector CT imaging of the abdomen and pelvis was performed using the standard protocol following bolus administration of intravenous contrast. RADIATION DOSE REDUCTION: This exam was performed according to the departmental dose-optimization program which includes automated exposure control, adjustment of the mA and/or kV according to patient size and/or use of iterative reconstruction technique. CONTRAST:  160m OMNIPAQUE IOHEXOL 300 MG/ML  SOLN COMPARISON:  None Available. FINDINGS: Lower chest: No acute abnormality. Hepatobiliary: No focal liver abnormality is seen. No gallstones, gallbladder wall thickening, or biliary dilatation. Pancreas: Unremarkable. No pancreatic ductal  dilatation or surrounding inflammatory changes. Spleen: Normal in size without focal abnormality. Adrenals/Urinary Tract: Adrenal glands appear normal. Small bilateral renal cysts are noted for which no further follow-up is required. No hydronephrosis or renal obstruction is noted. Urinary bladder is unremarkable. Stomach/Bowel: Stomach is within normal limits. Appendix appears normal. No evidence of bowel wall thickening, distention, or inflammatory changes. Vascular/Lymphatic: Aortic atherosclerosis. No enlarged abdominal or pelvic lymph nodes. Reproductive: Prostate is unremarkable. Other: Small fat containing periumbilical hernia. No ascites is noted. Musculoskeletal: No acute or significant osseous findings. IMPRESSION: No acute abnormality seen in the abdomen or pelvis. Small fat containing periumbilical hernia. Aortic Atherosclerosis (ICD10-I70.0). Electronically Signed   By: JMarijo ConceptionM.D.   On: 04/27/2022 13:44        Scheduled Meds:  budesonide (PULMICORT) nebulizer solution  0.25 mg Nebulization BID   Chlorhexidine Gluconate Cloth  6 each Topical Daily   guaiFENesin  600 mg Oral BID   ipratropium  0.5 mg Nebulization Q6H   latanoprost  1 drop Both Eyes QHS   levalbuterol  0.63 mg Nebulization Q6H   pantoprazole (PROTONIX) IV  40 mg Intravenous Q12H   potassium chloride  40 mEq Oral Q4H   rivaroxaban  20 mg Oral Daily   rosuvastatin  20 mg Oral Daily   Continuous Infusions:   LOS: 2 days    Time spent: 367ms    JeKathie DikeMD [facility]   If 7PM-7AM, please contact night-coverage [email or web address]  04/29/2022, 12:00 PM

## 2022-04-29 NOTE — Progress Notes (Signed)
Derrick Alexander currently has good waveform, draws blood, flushes when tubing under missing "button" is pressed. Site WNL at this time.

## 2022-04-30 DIAGNOSIS — K922 Gastrointestinal hemorrhage, unspecified: Secondary | ICD-10-CM | POA: Diagnosis not present

## 2022-04-30 DIAGNOSIS — I4891 Unspecified atrial fibrillation: Secondary | ICD-10-CM | POA: Diagnosis not present

## 2022-04-30 DIAGNOSIS — J410 Simple chronic bronchitis: Secondary | ICD-10-CM

## 2022-04-30 DIAGNOSIS — D62 Acute posthemorrhagic anemia: Secondary | ICD-10-CM | POA: Diagnosis not present

## 2022-04-30 LAB — BASIC METABOLIC PANEL
Anion gap: 6 (ref 5–15)
BUN: 17 mg/dL (ref 8–23)
CO2: 21 mmol/L — ABNORMAL LOW (ref 22–32)
Calcium: 8.9 mg/dL (ref 8.9–10.3)
Chloride: 109 mmol/L (ref 98–111)
Creatinine, Ser: 0.88 mg/dL (ref 0.61–1.24)
GFR, Estimated: >60 mL/min (ref >60)
Glucose, Bld: 113 mg/dL — ABNORMAL HIGH (ref 70–99)
Potassium: 4.4 mmol/L (ref 3.5–5.1)
Sodium: 136 mmol/L (ref 135–145)

## 2022-04-30 LAB — CBC
HCT: 24.9 % — ABNORMAL LOW (ref 39.0–52.0)
Hemoglobin: 7.9 g/dL — ABNORMAL LOW (ref 13.0–17.0)
MCH: 30.7 pg (ref 26.0–34.0)
MCHC: 31.7 g/dL (ref 30.0–36.0)
MCV: 96.9 fL (ref 80.0–100.0)
Platelets: 142 10*3/uL — ABNORMAL LOW (ref 150–400)
RBC: 2.57 MIL/uL — ABNORMAL LOW (ref 4.22–5.81)
RDW: 17 % — ABNORMAL HIGH (ref 11.5–15.5)
WBC: 9 10*3/uL (ref 4.0–10.5)
nRBC: 0.2 % (ref 0.0–0.2)

## 2022-04-30 LAB — MAGNESIUM: Magnesium: 2.4 mg/dL (ref 1.7–2.4)

## 2022-04-30 MED ORDER — PANTOPRAZOLE SODIUM 40 MG PO TBEC: 40.0000 mg | DELAYED_RELEASE_TABLET | Freq: Every day | ORAL | 1 refills | Status: DC

## 2022-04-30 MED ORDER — ALBUTEROL SULFATE (2.5 MG/3ML) 0.083% IN NEBU: 2.5000 mg | INHALATION_SOLUTION | RESPIRATORY_TRACT | 12 refills | Status: AC | PRN

## 2022-04-30 NOTE — Plan of Care (Signed)
  Problem: Education: Goal: Knowledge of General Education information will improve Description: Including pain rating scale, medication(s)/side effects and non-pharmacologic comfort measures Outcome: Adequate for Discharge   Problem: Health Behavior/Discharge Planning: Goal: Ability to manage health-related needs will improve Outcome: Adequate for Discharge   Problem: Clinical Measurements: Goal: Ability to maintain clinical measurements within normal limits will improve Outcome: Adequate for Discharge Goal: Will remain free from infection Outcome: Adequate for Discharge Goal: Diagnostic test results will improve Outcome: Adequate for Discharge Goal: Respiratory complications will improve Outcome: Adequate for Discharge Goal: Cardiovascular complication will be avoided Outcome: Adequate for Discharge   Problem: Activity: Goal: Risk for activity intolerance will decrease Outcome: Adequate for Discharge   Problem: Nutrition: Goal: Adequate nutrition will be maintained Outcome: Adequate for Discharge   Problem: Coping: Goal: Level of anxiety will decrease Outcome: Adequate for Discharge   Problem: Elimination: Goal: Will not experience complications related to bowel motility Outcome: Adequate for Discharge Goal: Will not experience complications related to urinary retention Outcome: Adequate for Discharge   Problem: Pain Managment: Goal: General experience of comfort will improve Outcome: Adequate for Discharge   Problem: Safety: Goal: Ability to remain free from injury will improve Outcome: Adequate for Discharge   Problem: Skin Integrity: Goal: Risk for impaired skin integrity will decrease Outcome: Adequate for Discharge   Problem: Education: Goal: Ability to identify signs and symptoms of gastrointestinal bleeding will improve 04/30/2022 1714 by Armando Reichert, RN Outcome: Adequate for Discharge 04/30/2022 0903 by Armando Reichert, RN Outcome:  Progressing   Problem: Bowel/Gastric: Goal: Will show no signs and symptoms of gastrointestinal bleeding 04/30/2022 1714 by Armando Reichert, RN Outcome: Adequate for Discharge 04/30/2022 8828 by Armando Reichert, RN Outcome: Progressing   Problem: Fluid Volume: Goal: Will show no signs and symptoms of excessive bleeding 04/30/2022 1714 by Armando Reichert, RN Outcome: Adequate for Discharge 04/30/2022 0903 by Armando Reichert, RN Outcome: Progressing   Problem: Clinical Measurements: Goal: Complications related to the disease process, condition or treatment will be avoided or minimized 04/30/2022 1714 by Armando Reichert, RN Outcome: Adequate for Discharge 04/30/2022 0903 by Armando Reichert, RN Outcome: Progressing

## 2022-04-30 NOTE — Discharge Summary (Signed)
Physician Discharge Summary  Derrick Alexander OVZ:[id number] DOB: [birth year] DOA: 04/27/2022  PCP: [name], MD  Admit date: 04/27/2022 Discharge date: 04/30/2022  Admitted From: Home Disposition: Home  Recommendations for Outpatient Follow-up:  Follow up with PCP in 1-2 weeks Please obtain BMP/CBC in one week Follow-up with primary pulmonologist in the next few weeks Follow-up with primary cardiologist as scheduled Follow-up with gastroenterology as needed    Discharge Condition: Stable CODE STATUS: Full code Diet recommendation: Heart healthy  Brief/Interim Summary: 76 year old male admitted to the hospital with 2-week history of melena. Chronically on Xarelto for atrial fibrillation. Noted to be hypotensive after arriving to the emergency room. Resuscitated with IV fluids and blood products. Seen by GI and underwent EGD that showed 3 gastric AVMs. This was treated with APC.   Discharge Diagnoses:  Principal Problem:   Acute upper GI bleed Active Problems:   QT prolongation   Atrial fibrillation (HCC)   Chronic obstructive pulmonary disease (HCC)   Essential hypertension   Acute blood loss anemia   Leukocytosis  Acute upper GI bleeding -Status post endoscopy -Noted to have 3 gastric AVMs that were bleeding, treated with APC -Continue PPI -Advance diet   Acute blood loss anemia -Status posttransfusion of 3unit PRBC -Hemoglobin down to 7.2, improved to 8.3 after transfusion and has remained stable -Continue to monitor   Hypotension -Initially concerns for hemorrhagic shock -Blood pressures difficult to monitor with blood pressure cuff, patient had arterial line placed for more accurate readings -He has received IV fluids and blood products -Continue to hold antihypertensive -Overall blood pressures have been stable   Persistent atrial fibrillation -Status post ablation in 08/2020 -CHA2DS2-VASc 4 -Currently in sinus rhythm -Does not appear to be on any chronic  rate control or rhythm control -EKG showing sinus rhythm with PVCs -Keep potassium greater than 4, magnesium greater than 2 -Chronically on Xarelto, held for GI bleed -Xarelto was resumed prior to discharge   OSA -Continue CPAP   COPD -Continue on inhaled steroids, bronchodilators -chest xray did not show any evidence of pneumonia -he did not have any wheezing -did not qualify for home oxygen -current resp status at baseline   Hypertension -Continue to hold irbesartan for now -Resume as blood pressure will allow   Hyperlipidemia -Continue statin  Discharge Instructions  Discharge Instructions     Diet - low sodium heart healthy   Complete by: As directed    For home use only DME Nebulizer machine   Complete by: As directed    Patient needs a nebulizer to treat with the following condition: COPD (chronic obstructive pulmonary disease) ([name])   Length of Need: 6 Months   Increase activity slowly   Complete by: As directed       Allergies as of 04/30/2022       Reactions   Amiodarone Nausea Only   Nausea, abdominal pain, SOB, dizziness   Diltiazem Shortness Of Breath   Flecainide Shortness Of Breath        Medication List     STOP taking these medications    irbesartan 300 MG tablet Commonly known as: AVAPRO       TAKE these medications    albuterol 108 (90 Base) MCG/ACT inhaler Commonly known as: VENTOLIN HFA Inhale 2 puffs into the lungs every 6 (six) hours as needed for wheezing or shortness of breath. What changed: Another medication with the same name was added. Make sure you understand how and when to take each.   albuterol (2.5  MG/3ML) 0.083% nebulizer solution Commonly known as: PROVENTIL Take 3 mLs (2.5 mg total) by nebulization every 4 (four) hours as needed for wheezing or shortness of breath. What changed: You were already taking a medication with the same name, and this prescription was added. Make sure you understand how and when to take  each.   cetirizine 10 MG tablet Commonly known as: ZYRTEC Take 10 mg by mouth daily.   Ester-C Tabs Take 1 tablet by mouth daily.   famotidine 20 MG tablet Commonly known as: PEPCID Take 20 mg by mouth at bedtime.   fluticasone-salmeterol 250-50 MCG/ACT Aepb Commonly known as: Advair Diskus Inhale 1 puff into the lungs in the morning and at bedtime.   furosemide 40 MG tablet Commonly known as: LASIX Take 1.5 tablets (60 mg total) by mouth 2 (two) times daily.   guaiFENesin 600 MG 12 hr tablet Commonly known as: MUCINEX Take by mouth 2 (two) times daily.   latanoprost 0.005 % ophthalmic solution Commonly known as: XALATAN Place 1 drop into both eyes at bedtime.   pantoprazole 40 MG tablet Commonly known as: Protonix Take 1 tablet (40 mg total) by mouth daily.   rosuvastatin 20 MG tablet Commonly known as: CRESTOR Take 20 mg by mouth daily.   sodium chloride 0.65 % Soln nasal spray Commonly known as: OCEAN Place 1 spray into both nostrils as needed for congestion.   spironolactone 25 MG tablet Commonly known as: ALDACTONE Take 1 tablet (25 mg total) by mouth daily.   Xarelto 20 MG Tabs tablet Generic drug: rivaroxaban TAKE 1 TABLET BY MOUTH EVERY DAY WITH DINNER               Durable Medical Equipment  (From admission, onward)           Start     Ordered   04/30/22 1436  For home use only DME oxygen  Once       Question Answer Comment  Length of Need 6 Months   Mode or (Route) Nasal cannula   Liters per Minute 2   Frequency Continuous (stationary and portable oxygen unit needed)   Oxygen conserving device Yes   Oxygen delivery system Gas      04/30/22 1435   04/30/22 0000  For home use only DME Nebulizer machine       Question Answer Comment  Patient needs a nebulizer to treat with the following condition COPD (chronic obstructive pulmonary disease) ([place])   Length of Need 6 Months      04/30/22 1637            Follow-up Information      [name], MD. Schedule an appointment as soon as possible for a visit in 1 week(s).   Specialty: Internal Medicine Contact information: [address] [name] [address] [phone]         Margaretha Seeds, MD. Schedule an appointment as soon as possible for a visit in 2 week(s).   Specialty: Pulmonary Disease Contact information: [address] [address] [phone]         Constance Haw, MD Follow up.   Specialty: Cardiology Why: as scheduled Contact information: [address] [place] [address] [phone]                Allergies  Allergen Reactions   Amiodarone Nausea Only    Nausea, abdominal pain, SOB, dizziness   Diltiazem Shortness Of Breath  Flecainide Shortness Of Breath    Consultations: Gastroenterology   Procedures/Studies: DG CHEST PORT 1 VIEW  Result Date: 04/29/2022 CLINICAL DATA:  Cough.  History of upper GI bleed. EXAM: PORTABLE CHEST 1 VIEW COMPARISON:  04/27/2022 and older exams. FINDINGS: Cardiac silhouette is normal in size. No mediastinal or hilar masses. Clear lungs.  No convincing pleural effusion.  No pneumothorax. Skeletal structures are grossly intact. IMPRESSION: No active disease. Electronically Signed   By: Lajean Manes M.D.   On: 04/29/2022 11:46   CT Abdomen Pelvis W Contrast  Result Date: 04/27/2022 CLINICAL DATA:  Dark stools.  Acute generalized abdominal pain. EXAM: CT ABDOMEN AND PELVIS WITH CONTRAST TECHNIQUE: Multidetector CT imaging of the abdomen and pelvis was performed using the standard protocol following bolus administration of intravenous contrast. RADIATION DOSE REDUCTION: This exam was performed according to the departmental dose-optimization program which includes automated exposure control, adjustment of the mA and/or kV according to patient size and/or use of iterative reconstruction technique. CONTRAST:  13m OMNIPAQUE IOHEXOL 300  MG/ML  SOLN COMPARISON:  None Available. FINDINGS: Lower chest: No acute abnormality. Hepatobiliary: No focal liver abnormality is seen. No gallstones, gallbladder wall thickening, or biliary dilatation. Pancreas: Unremarkable. No pancreatic ductal dilatation or surrounding inflammatory changes. Spleen: Normal in size without focal abnormality. Adrenals/Urinary Tract: Adrenal glands appear normal. Small bilateral renal cysts are noted for which no further follow-up is required. No hydronephrosis or renal obstruction is noted. Urinary bladder is unremarkable. Stomach/Bowel: Stomach is within normal limits. Appendix appears normal. No evidence of bowel wall thickening, distention, or inflammatory changes. Vascular/Lymphatic: Aortic atherosclerosis. No enlarged abdominal or pelvic lymph nodes. Reproductive: Prostate is unremarkable. Other: Small fat containing periumbilical hernia. No ascites is noted. Musculoskeletal: No acute or significant osseous findings. IMPRESSION: No acute abnormality seen in the abdomen or pelvis. Small fat containing periumbilical hernia. Aortic Atherosclerosis (ICD10-I70.0). Electronically Signed   By: JMarijo ConceptionM.D.   On: 04/27/2022 13:44   DG Chest Port 1 View  Result Date: 04/27/2022 CLINICAL DATA:  Shortness of breath, cough. EXAM: PORTABLE CHEST 1 VIEW COMPARISON:  June 19, 2017. FINDINGS: Stable cardiomediastinal silhouette. Both lungs are clear. The visualized skeletal structures are unremarkable. IMPRESSION: No active disease. Electronically Signed   By: JMarijo ConceptionM.D.   On: 04/27/2022 11:34      Subjective: He is feeling better.  No further bowel movements.  Feels her breathing is at baseline.  Was able to ambulate with physical therapy and felt he did fairly well.  Was able to walk around the entire unit a few times.  Discharge Exam: Vitals:   04/30/22 1400 04/30/22 1500 04/30/22 1550 04/30/22 1600  BP: (!) 132/59     Pulse: 97 94 (!) 104 (!) 101   Resp: 19 (!) '21 16 19  '$ Temp:   98.7 F (37.1 C)   TempSrc:   Oral   SpO2: 99% 97% 100% 97%  Weight:      Height:        General: Pt is alert, awake, not in acute distress Cardiovascular: RRR, S1/S2 +, no rubs, no gallops Respiratory: CTA bilaterally, no wheezing, no rhonchi Abdominal: Soft, NT, ND, bowel sounds + Extremities: no edema, no cyanosis    The results of significant diagnostics from this hospitalization (including imaging, microbiology, ancillary and laboratory) are listed below for reference.     Microbiology: Recent Results (from the past 240 hour(s))  MRSA Next Gen by PCR, Nasal     Status: None  Collection Time: 04/28/22 12:00 AM   Specimen: Nasal Mucosa; Nasal Swab  Result Value Ref Range Status   MRSA by PCR Next Gen NOT DETECTED NOT DETECTED Final    Comment: (NOTE) The GeneXpert MRSA Assay (FDA approved for NASAL specimens only), is one component of a comprehensive MRSA colonization surveillance program. It is not intended to diagnose MRSA infection nor to guide or monitor treatment for MRSA infections. Test performance is not FDA approved in patients less than 60 years old. Performed at [facility], [place] [address], [place], [place] [address]      Labs: BNP (last 3 results) Recent Labs    04/29/22 0500  BNP 097.3*   Basic Metabolic Panel: Recent Labs  Lab 04/27/22 1116 04/27/22 2324 04/29/22 0500 04/29/22 2156 04/30/22 0535  NA 134*  --  136 137 136  K 4.3  --  3.7 4.6 4.4  CL 100  --  108 110 109  CO2 27  --  20* 20* 21*  GLUCOSE 132*  --  140* 113* 113*  BUN 31*  --  '22 18 17  '$ CREATININE 1.00  --  0.94 0.88 0.88  CALCIUM 9.4  --  8.8* 9.1 8.9  MG  --  1.9 2.3  --  2.4   Liver Function Tests: Recent Labs  Lab 04/27/22 1116  AST 13*  ALT 14  ALKPHOS 44  BILITOT 0.4  PROT 6.0*  ALBUMIN 4.1   Recent Labs  Lab 04/27/22 1116  LIPASE 18   No results for input(s): "AMMONIA" in the last 168  hours. CBC: Recent Labs  Lab 04/27/22 1116 04/27/22 1726 04/28/22 0620 04/28/22 1811 04/29/22 0500 04/29/22 2156 04/30/22 0535  WBC 11.4*   < > 9.6 10.2 12.0* 11.1* 9.0  NEUTROABS 8.4*  --   --   --   --   --   --   HGB 7.4*   < > 8.3* 8.0* 8.4* 8.4* 7.9*  HCT 22.5*   < > 25.5* 24.3* 25.3* 26.5* 24.9*  MCV 90.4   < > 92.7 93.8 93.4 98.9 96.9  PLT 183   < > 145* 150 148* 141* 142*   < > = values in this interval not displayed.   Cardiac Enzymes: No results for input(s): "CKTOTAL", "CKMB", "CKMBINDEX", "TROPONINI" in the last 168 hours. BNP: Invalid input(s): "POCBNP" CBG: No results for input(s): "GLUCAP" in the last 168 hours. D-Dimer No results for input(s): "DDIMER" in the last 72 hours. Hgb A1c No results for input(s): "HGBA1C" in the last 72 hours. Lipid Profile No results for input(s): "CHOL", "HDL", "LDLCALC", "TRIG", "CHOLHDL", "LDLDIRECT" in the last 72 hours. Thyroid function studies No results for input(s): "TSH", "T4TOTAL", "T3FREE", "THYROIDAB" in the last 72 hours.  Invalid input(s): "FREET3" Anemia work up No results for input(s): "VITAMINB12", "FOLATE", "FERRITIN", "TIBC", "IRON", "RETICCTPCT" in the last 72 hours. Urinalysis No results found for: "COLORURINE", "APPEARANCEUR", "LABSPEC", "PHURINE", "GLUCOSEU", "HGBUR", "BILIRUBINUR", "KETONESUR", "PROTEINUR", "UROBILINOGEN", "NITRITE", "LEUKOCYTESUR" Sepsis Labs Recent Labs  Lab 04/28/22 1811 04/29/22 0500 04/29/22 2156 04/30/22 0535  WBC 10.2 12.0* 11.1* 9.0   Microbiology Recent Results (from the past 240 hour(s))  MRSA Next Gen by PCR, Nasal     Status: None   Collection Time: 04/28/22 12:00 AM   Specimen: Nasal Mucosa; Nasal Swab  Result Value Ref Range Status   MRSA by PCR Next Gen NOT DETECTED NOT DETECTED Final    Comment: (NOTE) The GeneXpert MRSA Assay (FDA approved for NASAL specimens  only), is one component of a comprehensive MRSA colonization surveillance program. It is not intended  to diagnose MRSA infection nor to guide or monitor treatment for MRSA infections. Test performance is not FDA approved in patients less than 27 years old. Performed at [facility], [place] [address] [place], [place] [address]      Time coordinating discharge: 37mns  SIGNED:   JKathie Dike MD  [facility] 04/30/2022, 8:33 PM   If 7PM-7AM, please contact night-coverage [email or web address]

## 2022-04-30 NOTE — Progress Notes (Signed)
  Transition of Care [name]) Screening Note   Patient Details  Name: Derrick Alexander Date of Birth: [birth year]   Transition of Care [facility]) CM/SW Contact:    Henrietta Dine, RN Phone Number: 04/30/2022, 5:17 PM    Transition of Care Department [facility]) has reviewed patient and no TOC needs have been identified at this time. We will continue to monitor patient advancement through interdisciplinary progression rounds. If new patient transition needs arise, please place a TOC consult.

## 2022-04-30 NOTE — Plan of Care (Signed)

## 2022-04-30 NOTE — Anesthesia Postprocedure Evaluation (Signed)
Anesthesia Post Note  Patient: Juliocesar Blasius  Procedure(s) Performed: ESOPHAGOGASTRODUODENOSCOPY (EGD) WITH PROPOFOL HOT HEMOSTASIS (ARGON PLASMA COAGULATION/BICAP)     Patient location during evaluation: Endoscopy Anesthesia Type: MAC Level of consciousness: awake and alert Pain management: pain level controlled Vital Signs Assessment: post-procedure vital signs reviewed and stable Respiratory status: spontaneous breathing, nonlabored ventilation, respiratory function stable and patient connected to nasal cannula oxygen Cardiovascular status: blood pressure returned to baseline and stable Postop Assessment: no apparent nausea or vomiting Anesthetic complications: no  No notable events documented.  Last Vitals:  Vitals:   04/30/22 1550 04/30/22 1600  BP:    Pulse: (!) 104 (!) 101  Resp: 16 19  Temp: 37.1 C   SpO2: 100% 97%    Last Pain:  Vitals:   04/30/22 1600  TempSrc:   PainSc: 0-No pain                 Armonee Bojanowski L Donte Lenzo

## 2022-04-30 NOTE — Evaluation (Signed)
Physical Therapy Evaluation Patient Details Name: Derrick Alexander MRN: [id number] DOB: Sep 27, 1945 Today's Date: 04/30/2022  History of Present Illness  76 y.o. male with  a chief complaint of melena x 2 weeks.  GI consulted and underwent EGD  that showed 3 gastric AVMs. PMH: A-fib on Xarelto, COPD, OSA on CPAP, hypertension, NSVT, PVCs, LBBB  Clinical Impression  Patient evaluated by Physical Therapy with no further acute PT needs identified. All education has been completed and the patient has no further questions.  Pt reports feeling much better feels he is getting back to his baseline. SpO2=96-100% on RA during activity, a few times decr to upper 80s however appeared to be inaccurate reading with quick incr to 90s when finger probe adjusted. HR max 128.  No further PT needs at this time, pt and significant other hopeful to d/c home   See below for any follow-up Physical Therapy or equipment needs. PT is signing off. Thank you for this referral.        Recommendations for follow up therapy are one component of a multi-disciplinary discharge planning process, led by the attending physician.  Recommendations may be updated based on patient status, additional functional criteria and insurance authorization.  Follow Up Recommendations No PT follow up      Assistance Recommended at Discharge PRN  Patient can return home with the following       Equipment Recommendations None recommended by PT  Recommendations for Other Services       Functional Status Assessment Patient has not had a recent decline in their functional status     Precautions / Restrictions Precautions Precautions: Fall Restrictions Weight Bearing Restrictions: No      Mobility  Bed Mobility               General bed mobility comments: NT- pt    Transfers Overall transfer level: Needs assistance Equipment used: Rolling walker (2 wheels) Transfers: Sit to/from Stand Sit to Stand: Supervision                 Ambulation/Gait Ambulation/Gait assistance: Supervision, Modified independent (Device/Increase time) Gait Distance (Feet): 300 Feet Assistive device: None Gait Pattern/deviations: Step-through pattern, Wide base of support       General Gait Details: 3 rest breaks d/t dyspnea. SpO2=96-100% on RA, HR 100-128 max; no LOB steady without device  Stairs            Wheelchair Mobility    Modified Rankin (Stroke Patients Only)       Balance Overall balance assessment: Mild deficits observed, not formally tested                                           Pertinent Vitals/Pain Pain Assessment Pain Assessment: No/denies pain    Home Living Family/patient expects to be discharged to:: Private residence Living Arrangements: Spouse/significant other Available Help at Discharge: Family Type of Home: House Home Access: Stairs to enter   Technical brewer of Steps: 3 or 5   Home Layout: One level Home Equipment: Cane - single point;Rollator (4 wheels)      Prior Function Prior Level of Function : Independent/Modified Independent;Driving             Mobility Comments: IND, would dyspneic with longer distances       Hand Dominance        Extremity/Trunk Assessment   Upper  Extremity Assessment Upper Extremity Assessment: Overall WFL for tasks assessed    Lower Extremity Assessment Lower Extremity Assessment: Overall WFL for tasks assessed       Communication   Communication: No difficulties  Cognition Arousal/Alertness: Awake/alert Behavior During Therapy: WFL for tasks assessed/performed Overall Cognitive Status: Within Functional Limits for tasks assessed                                          General Comments      Exercises     Assessment/Plan    PT Assessment Patient does not need any further PT services  PT Problem List         PT Treatment Interventions      PT Goals (Current goals  can be found in the Care Plan section)  Acute Rehab PT Goals PT Goal Formulation: All assessment and education complete, DC therapy    Frequency       Co-evaluation               AM-PAC PT "6 Clicks" Mobility  Outcome Measure Help needed turning from your back to your side while in a flat bed without using bedrails?: A Little Help needed moving from lying on your back to sitting on the side of a flat bed without using bedrails?: A Little Help needed moving to and from a bed to a chair (including a wheelchair)?: None Help needed standing up from a chair using your arms (e.g., wheelchair or bedside chair)?: None Help needed to walk in hospital room?: None Help needed climbing 3-5 steps with a railing? : None 6 Click Score: 22    End of Session Equipment Utilized During Treatment: Gait belt Activity Tolerance: Patient tolerated treatment well Patient left: in chair;with call bell/phone within reach;with family/visitor present Nurse Communication: Mobility status PT Visit Diagnosis: Difficulty in walking, not elsewhere classified (R26.2)    Time: 9518-8416 PT Time Calculation (min) (ACUTE ONLY): 22 min   Charges:   PT Evaluation $PT Eval Low Complexity: [place], PT  Acute Rehab Dept [facility]) [phone]  [facility] Weekend Pager (Saturday/Sunday only)  [phone]  04/30/2022   [facility] 04/30/2022, 5:01 PM

## 2022-05-01 ENCOUNTER — Encounter (HOSPITAL_COMMUNITY): Payer: Self-pay | Admitting: Gastroenterology

## 2022-05-01 LAB — BPAM RBC
Blood Product Expiration Date: 202401052359
Blood Product Expiration Date: 202401062359
Blood Product Expiration Date: 202401062359
ISSUE DATE / TIME: 202312080111
Unit Type and Rh: 5100
Unit Type and Rh: 5100
Unit Type and Rh: 5100

## 2022-05-01 LAB — TYPE AND SCREEN
ABO/RH(D): O POS
Antibody Screen: NEGATIVE
Unit division: 0
Unit division: 0
Unit division: 0

## 2022-05-02 ENCOUNTER — Telehealth: Payer: Self-pay | Admitting: Pulmonary Disease

## 2022-05-02 DIAGNOSIS — J449 Chronic obstructive pulmonary disease, unspecified: Secondary | ICD-10-CM

## 2022-05-02 NOTE — Telephone Encounter (Signed)
Called and spoke w/ [name], she verbalized that pt just got out of the hospital and floor MD recommended pt have a nebulizer machine and oxygen. I did let [name] know that in order to get oxygen pt would need to be walked in the office she verbalized understanding & wishes to do that walk at [place] on 12/19  Dr.Ellison please advise if you are okay with me placing an order for a nebulizer?

## 2022-05-03 NOTE — Telephone Encounter (Signed)
Called and spoke with Derrick Alexander. She is aware that the order has been placed for the neb machine. Nothing further needed at time of call.

## 2022-05-03 NOTE — Telephone Encounter (Signed)
OK for nebulizer order

## 2022-05-04 DIAGNOSIS — J410 Simple chronic bronchitis: Secondary | ICD-10-CM | POA: Diagnosis not present

## 2022-05-04 NOTE — Progress Notes (Signed)
Cardiology Office Note Date:  05/05/2022  Patient ID:  Abby, Stines 24-Jan-1946, MRN [id number] PCP:  [name], MD  Cardiologist:  Pixie Casino, MD Electrophysiologist: Constance Haw, MD   Chief Complaint: GI bleed hospitalization f/u  History of Present Illness: Tauno Falotico is a 76 y.o. male seen today for Will [name], MD for post hospital follow up.    Had episodes of syncope ~2017, wore monitor that showed 16-beat run of NSVT and bradycardia down to 30s when sleeping. He also has h/o AF, failed flecainide and amio. s/p PVI AF ablation 08/2020  Saw Dr. Curt Bears 01/2022 doing well, had baseline SOB but no other major compalints. Found to have PVCs on EKG. He wore a zio monitor at that time that showed 9% AF burden, 13% PVC burden. He was still having SOB, so an echo was recommended. EF normal, so no change in plan.  Hospitalized 12/7-02/2022 for 2-weeks of melena, SOB, dizziness; was hypotensive at admission. Rec'd 3u PRBC.  Colonoscopy revealed 3 gastric AVMs, treatd with APC. Xarelto resumed at dc.   Today, he is slowly getting back to normal. Has had a 2 days of normal BMs. His SOB is also slowly improving. Unfortunately, he has a cough/cold symptoms today. He denies CP, minimal palpitations, no syncope or presyncope since discharge. He has lower extremity edema, stable, improves in morning. Really enjoys salty food and country ham.    He continues to take xarelto daily.   AAD history -  AF diag 2022 s/p colonoscopy; flecainide stopped d/t junctional brady; Amio stopped d/t GI upset  Past Medical History:  Diagnosis Date   A-fib ([place])    Abnormal EKG 06/18/2015   COPD (chronic obstructive pulmonary disease) ([facility])    COPD with exacerbation ([place])    Faintness 06/18/2015   Hypertension    LBBB (left bundle branch block) 06/18/2015   Obesity    Syncope 08/06/2015   Tobacco use    V-tach ([place]) 08/06/2015    Past Surgical History:  Procedure Laterality  Date   ATRIAL FIBRILLATION ABLATION N/A 09/17/2020   Procedure: ATRIAL FIBRILLATION ABLATION;  Surgeon: Constance Haw, MD;  Location: [facility];  Service: Cardiovascular;  Laterality: N/A;   COLONOSCOPY WITH PROPOFOL N/A 03/07/2019   Procedure: COLONOSCOPY WITH PROPOFOL;  Surgeon: Carol Ada, MD;  Location: [facility];  Service: Endoscopy;  Laterality: N/A;   ESOPHAGOGASTRODUODENOSCOPY (EGD) WITH PROPOFOL N/A 04/28/2022   Procedure: ESOPHAGOGASTRODUODENOSCOPY (EGD) WITH PROPOFOL;  Surgeon: Carol Ada, MD;  Location: [facility];  Service: Gastroenterology;  Laterality: N/A;   HOT HEMOSTASIS N/A 04/28/2022   Procedure: HOT HEMOSTASIS (ARGON PLASMA COAGULATION/BICAP);  Surgeon: Carol Ada, MD;  Location: [facility];  Service: Gastroenterology;  Laterality: N/A;   POLYPECTOMY  03/07/2019   Procedure: POLYPECTOMY;  Surgeon: Carol Ada, MD;  Location: [facility];  Service: Endoscopy;;    Current Outpatient Medications  Medication Sig Dispense Refill   albuterol (PROVENTIL) (2.5 MG/3ML) 0.083% nebulizer solution Take 3 mLs (2.5 mg total) by nebulization every 4 (four) hours as needed for wheezing or shortness of breath. 75 mL 12   albuterol (VENTOLIN HFA) 108 (90 Base) MCG/ACT inhaler Inhale 2 puffs into the lungs every 6 (six) hours as needed for wheezing or shortness of breath. 8 g 6   cetirizine (ZYRTEC) 10 MG tablet Take 10 mg by mouth daily.     famotidine (PEPCID) 20 MG tablet Take 20 mg by mouth at bedtime.      fluticasone-salmeterol (  ADVAIR DISKUS) 250-50 MCG/ACT AEPB Inhale 1 puff into the lungs in the morning and at bedtime. 60 each 5   furosemide (LASIX) 40 MG tablet Take 1.5 tablets (60 mg total) by mouth 2 (two) times daily. 270 tablet 1   guaiFENesin (MUCINEX) 600 MG 12 hr tablet Take by mouth 2 (two) times daily as needed.     latanoprost (XALATAN) 0.005 % ophthalmic solution Place 1 drop into both eyes at bedtime.     pantoprazole (PROTONIX) 40 MG  tablet Take 1 tablet (40 mg total) by mouth daily. 30 tablet 1   rosuvastatin (CRESTOR) 20 MG tablet Take 20 mg by mouth daily.     sodium chloride (OCEAN) 0.65 % SOLN nasal spray Place 1 spray into both nostrils as needed for congestion.     spironolactone (ALDACTONE) 25 MG tablet Take 1 tablet (25 mg total) by mouth daily. 90 tablet 3   XARELTO 20 MG TABS tablet TAKE 1 TABLET BY MOUTH EVERY DAY WITH DINNER 90 tablet 1   No current facility-administered medications for this visit.    Allergies:   Amiodarone, Diltiazem, and Flecainide   Social History:  The patient  reports that he quit smoking about 4 years ago. His smoking use included cigarettes. He has a 120.00 pack-year smoking history. He has never used smokeless tobacco. He reports that he does not drink alcohol and does not use drugs.   Family History:  The patient's family history includes COPD in his brother; Diabetes in his mother; Heart disease in his brother.  ROS:  Please see the history of present illness. All other systems are reviewed and otherwise negative.   PHYSICAL EXAM:  Vitals:   05/05/22 0904  BP: 110/62  Pulse: 81  SpO2: 96%  Weight: 255 lb 12.8 oz (116 kg)  Height: '5\' 5"'$  (1.651 m)    GEN- The patient is well appearing, alert and oriented x 3 today.   HEENT: normocephalic, atraumatic; sclera clear, conjunctiva pink; hearing intact; oropharynx clear; neck supple, no JVP Lungs- Clear to ausculation bilaterally, normal work of breathing.  No wheezes, rales, rhonchi Heart- Irregularly irregular rate and rhythm, murmur heard loudest over LSB, no rubs or gallops, PMI not laterally displaced GI- soft, non-tender, non-distended, bowel sounds present, no hepatosplenomegaly Extremities- 1+ peripheral edema. no clubbing or cyanosis; DP/PT/radial pulses 2+ bilaterally MS- no significant deformity or atrophy Skin- warm and dry, no rash or lesion Psych- euthymic mood, full affect Neuro- strength and sensation are  intact   EKG is ordered. Personal review of EKG from today shows: NSR, rate 81bpm; frequent PVCs, no AF  Recent Labs: 04/27/2022: ALT 14 04/29/2022: B Natriuretic Peptide 393.6 04/30/2022: BUN 17; Creatinine, Ser 0.88; Hemoglobin 7.9; Magnesium 2.4; Platelets 142; Potassium 4.4; Sodium 136  No results found for requested labs within last 365 days.   Estimated Creatinine Clearance: 82.8 mL/min (by C-G formula based on SCr of 0.88 mg/dL).   Wt Readings from Last 3 Encounters:  05/05/22 255 lb 12.8 oz (116 kg)  04/28/22 255 lb 4.7 oz (115.8 kg)  02/14/22 264 lb 3.2 oz (119.8 kg)     Additional studies reviewed include: Previous EP, 04/2022 hospitalization notes  Echo 03/27/2022  1. Left ventricular ejection fraction, by estimation, is 60 to 65%. The left ventricle has normal function. The left ventricle has no regional  wall motion abnormalities. There is moderate left ventricular hypertrophy. Left ventricular diastolic parameters are consistent with Grade II diastolic dysfunction  (pseudonormalization). Elevated left ventricular end-diastolic  pressure. The E/e' is 29.   2. Right ventricular systolic function is normal. The right ventricular size is normal. There is mildly elevated pulmonary artery systolic pressure. The estimated right ventricular systolic pressure is 13.2 mmHg.   3. The mitral valve is abnormal. Trivial mitral valve regurgitation.   4. The aortic valve is calcified. Aortic valve regurgitation is not visualized. Moderate aortic valve stenosis. Aortic valve area, by VTI measures 1.06 cm. Aortic valve mean gradient measures 26.4 mmHg. Aortic  valve Vmax measures 3.62 m/s.   5. The inferior vena cava is normal in size with <50% respiratory variability, suggesting right atrial pressure of 8 mmHg.    Zio 02/2022 Patch Wear Time:  5 days and 2 hours   Predominant rhythm was sinus rhythm 9% atrial fibrillation burden. 1.3% supraventricular ectopy 12.1% ventricular  ectopy No triggered episodes recorded   ASSESSMENT AND PLAN:  #) persistent AF  Hypercoagulable d/t AF #) recent GI Bleed  S/p AF ablation 08/2020 by Dr. Curt Bears Recently wore zio that showed 9% AF burden  CHA2DS2-VASc Score = 4 [CHF History: 1, HTN History: 1, Diabetes History: 0, Stroke History: 0, Vascular Disease History: 0, Age Score: 2, Gender Score: 0].  Therefore, the patient's annual risk of stroke is 4.8 %.     AC - Xarelto '20mg'$  daily, appropriately dosed; CrCl 115 Updated labs today   #) PVCs By EKG at 01/2022 appt Wore zio showing 12% PVC burden With recent GI bleed/ anemia and slow symptom recovery, do not favor started BB today - close f/u, consider adding at next appt.   #) NSVT no further episodes, no syncope  #) HTN - well controlled today off irbesartan; cont to monitor  #) Grade 2 DD Volume status appears stable for patient.  Discussed limiting dietary indiscretions, increasing walking as tolerated.    Current medicines are reviewed at length with the patient today.   The patient does not have concerns regarding his medicines.  The following changes were made today:  none  Labs/ tests ordered today include:   Orders Placed This Encounter  Procedures   Comprehensive metabolic panel   Pro b natriuretic peptide (BNP)   CBC   EKG 12-Lead     Disposition: Follow up with EP APP in 4 weeks,    Follow-up next available with Dr. Debara Pickett or gen cards APP  Signed, Mamie Levers, NP  05/05/22 10:31 AM   [place] [address] [phone] (office)  [phone] (fax)

## 2022-05-05 ENCOUNTER — Encounter: Payer: Self-pay | Admitting: Physician Assistant

## 2022-05-05 ENCOUNTER — Ambulatory Visit: Payer: Medicare Other | Attending: Physician Assistant | Admitting: Cardiology

## 2022-05-05 VITALS — BP 110/62 | HR 81 | Ht 65.0 in | Wt 255.8 lb

## 2022-05-05 DIAGNOSIS — Z8719 Personal history of other diseases of the digestive system: Secondary | ICD-10-CM | POA: Diagnosis not present

## 2022-05-05 DIAGNOSIS — I4729 Other ventricular tachycardia: Secondary | ICD-10-CM

## 2022-05-05 DIAGNOSIS — I10 Essential (primary) hypertension: Secondary | ICD-10-CM

## 2022-05-05 DIAGNOSIS — R609 Edema, unspecified: Secondary | ICD-10-CM | POA: Diagnosis not present

## 2022-05-05 DIAGNOSIS — I4819 Other persistent atrial fibrillation: Secondary | ICD-10-CM

## 2022-05-05 DIAGNOSIS — D6869 Other thrombophilia: Secondary | ICD-10-CM

## 2022-05-05 LAB — CBC

## 2022-05-05 NOTE — Patient Instructions (Addendum)
Medication Instructions:   Your physician recommends that you continue on your current medications as directed. Please refer to the Current Medication list given to you today.   *If you need a refill on your cardiac medications before your next appointment, please call your pharmacy*   Lab Work: TODAY : CMET BNP AND CBC TODAY    If you have labs (blood work) drawn today and your tests are completely normal, you will receive your results only by: [place] (if you have MyChart) OR A paper copy in the mail If you have any lab test that is abnormal or we need to change your treatment, we will call you to review the results.   Testing/Procedures: NONE ORDERED  TODAY    Follow-Up: At [facility], you and your health needs are our priority.  As part of our continuing mission to provide you with exceptional heart care, we have created designated Provider Care Teams.  These Care Teams include your primary Cardiologist (physician) and Advanced Practice Providers (APPs -  Physician Assistants and Nurse Practitioners) who all work together to provide you with the care you need, when you need it.  We recommend signing up for the patient portal called "MyChart".  Sign up information is provided on this After Visit Summary.  MyChart is used to connect with patients for Virtual Visits (Telemedicine).  Patients are able to view lab/test results, encounter notes, upcoming appointments, etc.  Non-urgent messages can be sent to your provider as well.   To learn more about what you can do with MyChart, go to [email or web address].    Your next appointment:  NEXT AVAILABLE  WITH DR HILTY  4-6  week(s)  The format for your next appointment:   In Person  Provider:   Beryle Beams" Chalmers Cater, PA-C or Tommye Standard, PA-C     Other Instructions  Heart-Healthy Eating Plan Eating a healthy diet is important for the health of your heart. A heart-healthy eating plan includes: Eating less  unhealthy fats. Eating more healthy fats. Eating less salt in your food. Salt is also called sodium. Making other changes in your diet. Talk with your doctor or a diet specialist (dietitian) to create an eating plan that is right for you. What is my plan? Your doctor may recommend an eating plan that includes: Total fat: ______% or less of total calories a day. Saturated fat: ______% or less of total calories a day. Cholesterol: less than _________mg a day. Sodium: less than _________mg a day. What are tips for following this plan? Cooking Avoid frying your food. Try to bake, boil, grill, or broil it instead. You can also reduce fat by: Removing the skin from poultry. Removing all visible fats from meats. Steaming vegetables in water or broth. Meal planning  At meals, divide your plate into four equal parts: Fill one-half of your plate with vegetables and green salads. Fill one-fourth of your plate with whole grains. Fill one-fourth of your plate with lean protein foods. Eat 2-4 cups of vegetables per day. One cup of vegetables is: 1 cup (91 g) broccoli or cauliflower florets. 2 medium carrots. 1 large bell pepper. 1 large sweet potato. 1 large tomato. 1 medium white potato. 2 cups (150 g) raw leafy greens. Eat 1-2 cups of fruit per day. One cup of fruit is: 1 small apple 1 large banana 1 cup (237 g) mixed fruit, 1 large orange,  cup (82 g) dried fruit, 1 cup (240 mL) 100% fruit juice. Eat more foods  that have soluble fiber. These are apples, broccoli, carrots, beans, peas, and barley. Try to get 20-30 g of fiber per day. Eat 4-5 servings of nuts, legumes, and seeds per week: 1 serving of dried beans or legumes equals  cup (90 g) cooked. 1 serving of nuts is  oz (12 almonds, 24 pistachios, or 7 walnut halves). 1 serving of seeds equals  oz (8 g). General information Eat more home-cooked food. Eat less restaurant, buffet, and fast food. Limit or avoid  alcohol. Limit foods that are high in starch and sugar. Avoid fried foods. Lose weight if you are overweight. Keep track of how much salt (sodium) you eat. This is important if you have high blood pressure. Ask your doctor to tell you more about this. Try to add vegetarian meals each week. Fats Choose healthy fats. These include olive oil and canola oil, flaxseeds, walnuts, almonds, and seeds. Eat more omega-3 fats. These include salmon, mackerel, sardines, tuna, flaxseed oil, and ground flaxseeds. Try to eat fish at least 2 times each week. Check food labels. Avoid foods with trans fats or high amounts of saturated fat. Limit saturated fats. These are often found in animal products, such as meats, butter, and cream. These are also found in plant foods, such as palm oil, palm kernel oil, and coconut oil. Avoid foods with partially hydrogenated oils in them. These have trans fats. Examples are stick margarine, some tub margarines, cookies, crackers, and other baked goods. What foods should I eat? Fruits All fresh, canned (in natural juice), or frozen fruits. Vegetables Fresh or frozen vegetables (raw, steamed, roasted, or grilled). Green salads. Grains Most grains. Choose whole wheat and whole grains most of the time. Rice and pasta, including brown rice and pastas made with whole wheat. Meats and other proteins Lean, well-trimmed beef, veal, pork, and lamb. Chicken and [place] without skin. All fish and shellfish. Wild duck, rabbit, pheasant, and venison. Egg whites or low-cholesterol egg substitutes. Dried beans, peas, lentils, and tofu. Seeds and most nuts. Dairy Low-fat or nonfat cheeses, including ricotta and mozzarella. Skim or 1% milk that is liquid, powdered, or evaporated. Buttermilk that is made with low-fat milk. Nonfat or low-fat yogurt. Fats and oils Non-hydrogenated (trans-free) margarines. Vegetable oils, including soybean, sesame, sunflower, olive, peanut, safflower, corn,  canola, and cottonseed. Salad dressings or mayonnaise made with a vegetable oil. Beverages Mineral water. Coffee and tea. Diet carbonated beverages. Sweets and desserts Sherbet, gelatin, and fruit ice. Small amounts of dark chocolate. Limit all sweets and desserts. Seasonings and condiments All seasonings and condiments. The items listed above may not be a complete list of foods and drinks you can eat. Contact a dietitian for more options. What foods should I avoid? Fruits Canned fruit in heavy syrup. Fruit in cream or butter sauce. Fried fruit. Limit coconut. Vegetables Vegetables cooked in cheese, cream, or butter sauce. Fried vegetables. Grains Breads that are made with saturated or trans fats, oils, or whole milk. Croissants. Sweet rolls. Donuts. High-fat crackers, such as cheese crackers. Meats and other proteins Fatty meats, such as hot dogs, ribs, sausage, bacon, rib-eye roast or steak. High-fat deli meats, such as salami and bologna. Caviar. Domestic duck and goose. Organ meats, such as liver. Dairy Cream, sour cream, cream cheese, and creamed cottage cheese. Whole-milk cheeses. Whole or 2% milk that is liquid, evaporated, or condensed. Whole buttermilk. Cream sauce or high-fat cheese sauce. Yogurt that is made from whole milk. Fats and oils Meat fat, or shortening. Cocoa butter, hydrogenated  oils, palm oil, coconut oil, palm kernel oil. Solid fats and shortenings, including bacon fat, salt pork, lard, and butter. Nondairy cream substitutes. Salad dressings with cheese or sour cream. Beverages Regular sodas and juice drinks with added sugar. Sweets and desserts Frosting. Pudding. Cookies. Cakes. Pies. Milk chocolate or white chocolate. Buttered syrups. Full-fat ice cream or ice cream drinks. The items listed above may not be a complete list of foods and drinks to avoid. Contact a dietitian for more information. Summary Heart-healthy meal planning includes eating less unhealthy  fats, eating more healthy fats, and making other changes in your diet. Eat a balanced diet. This includes fruits and vegetables, low-fat or nonfat dairy, lean protein, nuts and legumes, whole grains, and heart-healthy oils and fats. This information is not intended to replace advice given to you by your health care provider. Make sure you discuss any questions you have with your health care provider. Document Revised: 06/13/2021 Document Reviewed: 06/13/2021 Elsevier Patient Education  [place].  Low-Sodium Eating Plan Sodium, which is an element that makes up salt, helps you maintain a healthy balance of fluids in your body. Too much sodium can increase your blood pressure and cause fluid and waste to be held in your body. Your health care provider or dietitian may recommend following this plan if you have high blood pressure (hypertension), kidney disease, liver disease, or heart failure. Eating less sodium can help lower your blood pressure, reduce swelling, and protect your heart, liver, and kidneys. What are tips for following this plan? Reading food labels The Nutrition Facts label lists the amount of sodium in one serving of the food. If you eat more than one serving, you must multiply the listed amount of sodium by the number of servings. Choose foods with less than 140 mg of sodium per serving. Avoid foods with 300 mg of sodium or more per serving. Shopping  Look for lower-sodium products, often labeled as "low-sodium" or "no salt added." Always check the sodium content, even if foods are labeled as "unsalted" or "no salt added." Buy fresh foods. Avoid canned foods and pre-made or frozen meals. Avoid canned, cured, or processed meats. Buy breads that have less than 80 mg of sodium per slice. Cooking  Eat more home-cooked food and less restaurant, buffet, and fast food. Avoid adding salt when cooking. Use salt-free seasonings or herbs instead of table salt or sea salt.  Check with your health care provider or pharmacist before using salt substitutes. Cook with plant-based oils, such as canola, sunflower, or olive oil. Meal planning When eating at a restaurant, ask that your food be prepared with less salt or no salt, if possible. Avoid dishes labeled as brined, pickled, cured, smoked, or made with soy sauce, miso, or teriyaki sauce. Avoid foods that contain MSG (monosodium glutamate). MSG is sometimes added to [place] food, bouillon, and some canned foods. Make meals that can be grilled, baked, poached, roasted, or steamed. These are generally made with less sodium. General information Most people on this plan should limit their sodium intake to 1,500-2,000 mg (milligrams) of sodium each day. What foods should I eat? Fruits Fresh, frozen, or canned fruit. Fruit juice. Vegetables Fresh or frozen vegetables. "No salt added" canned vegetables. "No salt added" tomato sauce and paste. Low-sodium or reduced-sodium tomato and vegetable juice. Grains Low-sodium cereals, including oats, puffed wheat and rice, and shredded wheat. Low-sodium crackers. Unsalted rice. Unsalted pasta. Low-sodium bread. Whole-grain breads and whole-grain pasta. Meats and other proteins Fresh or  frozen (no salt added) meat, poultry, seafood, and fish. Low-sodium canned tuna and salmon. Unsalted nuts. Dried peas, beans, and lentils without added salt. Unsalted canned beans. Eggs. Unsalted nut butters. Dairy Milk. Soy milk. Cheese that is naturally low in sodium, such as ricotta cheese, fresh mozzarella, or Swiss cheese. Low-sodium or reduced-sodium cheese. Cream cheese. Yogurt. Seasonings and condiments Fresh and dried herbs and spices. Salt-free seasonings. Low-sodium mustard and ketchup. Sodium-free salad dressing. Sodium-free light mayonnaise. Fresh or refrigerated horseradish. Lemon juice. Vinegar. Other foods Homemade, reduced-sodium, or low-sodium soups. Unsalted popcorn and pretzels.  Low-salt or salt-free chips. The items listed above may not be a complete list of foods and beverages you can eat. Contact a dietitian for more information. What foods should I avoid? Vegetables Sauerkraut, pickled vegetables, and relishes. Olives. [place] fries. Onion rings. Regular canned vegetables (not low-sodium or reduced-sodium). Regular canned tomato sauce and paste (not low-sodium or reduced-sodium). Regular tomato and vegetable juice (not low-sodium or reduced-sodium). Frozen vegetables in sauces. Grains Instant hot cereals. Bread stuffing, pancake, and biscuit mixes. Croutons. Seasoned rice or pasta mixes. Noodle soup cups. Boxed or frozen macaroni and cheese. Regular salted crackers. Self-rising flour. Meats and other proteins Meat or fish that is salted, canned, smoked, spiced, or pickled. Precooked or cured meat, such as sausages or meat loaves. Berniece Salines. Ham. Pepperoni. Hot dogs. Corned beef. Chipped beef. Salt pork. Jerky. Pickled herring. Anchovies and sardines. Regular canned tuna. Salted nuts. Dairy Processed cheese and cheese spreads. Hard cheeses. Cheese curds. Blue cheese. Feta cheese. String cheese. Regular cottage cheese. Buttermilk. Canned milk. Fats and oils Salted butter. Regular margarine. Ghee. Bacon fat. Seasonings and condiments Onion salt, garlic salt, seasoned salt, table salt, and sea salt. Canned and packaged gravies. Worcestershire sauce. Tartar sauce. Barbecue sauce. Teriyaki sauce. Soy sauce, including reduced-sodium. Steak sauce. Fish sauce. Oyster sauce. Cocktail sauce. Horseradish that you find on the shelf. Regular ketchup and mustard. Meat flavorings and tenderizers. Bouillon cubes. Hot sauce. Pre-made or packaged marinades. Pre-made or packaged taco seasonings. Relishes. Regular salad dressings. Salsa. Other foods Salted popcorn and pretzels. Corn chips and puffs. Potato and tortilla chips. Canned or dried soups. Pizza. Frozen entrees and pot pies. The items  listed above may not be a complete list of foods and beverages you should avoid. Contact a dietitian for more information. Summary Eating less sodium can help lower your blood pressure, reduce swelling, and protect your heart, liver, and kidneys. Most people on this plan should limit their sodium intake to 1,500-2,000 mg (milligrams) of sodium each day. Canned, boxed, and frozen foods are high in sodium. Restaurant foods, fast foods, and pizza are also very high in sodium. You also get sodium by adding salt to food. Try to cook at home, eat more fresh fruits and vegetables, and eat less fast food and canned, processed, or prepared foods. This information is not intended to replace advice given to you by your health care provider. Make sure you discuss any questions you have with your health care provider. Document Revised: 06/13/2019 Document Reviewed: 04/09/2019 Elsevier Patient Education  [place]

## 2022-05-06 LAB — COMPREHENSIVE METABOLIC PANEL
ALT: 22 IU/L (ref 0–44)
AST: 14 IU/L (ref 0–40)
Albumin/Globulin Ratio: 2.1 (ref 1.2–2.2)
Albumin: 3.9 g/dL (ref 3.8–4.8)
Alkaline Phosphatase: 74 IU/L (ref 44–121)
BUN/Creatinine Ratio: 9 — ABNORMAL LOW (ref 10–24)
BUN: 10 mg/dL (ref 8–27)
Bilirubin Total: 0.5 mg/dL (ref 0.0–1.2)
CO2: 25 mmol/L (ref 20–29)
Calcium: 9 mg/dL (ref 8.6–10.2)
Chloride: 103 mmol/L (ref 96–106)
Creatinine, Ser: 1.07 mg/dL (ref 0.76–1.27)
Globulin, Total: 1.9 g/dL (ref 1.5–4.5)
Glucose: 110 mg/dL — ABNORMAL HIGH (ref 70–99)
Potassium: 4.1 mmol/L (ref 3.5–5.2)
Sodium: 138 mmol/L (ref 134–144)
Total Protein: 5.8 g/dL — ABNORMAL LOW (ref 6.0–8.5)
eGFR: 71 mL/min/{1.73_m2} (ref >59)

## 2022-05-06 LAB — CBC
Hematocrit: 23.7 % — ABNORMAL LOW (ref 37.5–51.0)
Hemoglobin: 7.8 g/dL — ABNORMAL LOW (ref 13.0–17.7)
MCH: 29.1 pg (ref 26.6–33.0)
MCHC: 32.9 g/dL (ref 31.5–35.7)
MCV: 88 fL (ref 79–97)
Platelets: 212 10*3/uL (ref 150–450)
RBC: 2.68 x10E6/uL — CL (ref 4.14–5.80)
RDW: 14.9 % (ref 11.6–15.4)
WBC: 7.7 10*3/uL (ref 3.4–10.8)

## 2022-05-06 LAB — PRO B NATRIURETIC PEPTIDE: NT-Pro BNP: 756 pg/mL — ABNORMAL HIGH (ref 0–486)

## 2022-05-08 DIAGNOSIS — D500 Iron deficiency anemia secondary to blood loss (chronic): Secondary | ICD-10-CM | POA: Diagnosis not present

## 2022-05-08 DIAGNOSIS — I10 Essential (primary) hypertension: Secondary | ICD-10-CM | POA: Diagnosis not present

## 2022-05-08 DIAGNOSIS — Z09 Encounter for follow-up examination after completed treatment for conditions other than malignant neoplasm: Secondary | ICD-10-CM | POA: Diagnosis not present

## 2022-05-08 DIAGNOSIS — I480 Paroxysmal atrial fibrillation: Secondary | ICD-10-CM | POA: Diagnosis not present

## 2022-05-08 DIAGNOSIS — Z23 Encounter for immunization: Secondary | ICD-10-CM | POA: Diagnosis not present

## 2022-05-09 ENCOUNTER — Encounter (HOSPITAL_BASED_OUTPATIENT_CLINIC_OR_DEPARTMENT_OTHER): Payer: Self-pay | Admitting: Pulmonary Disease

## 2022-05-09 ENCOUNTER — Ambulatory Visit (INDEPENDENT_AMBULATORY_CARE_PROVIDER_SITE_OTHER): Payer: Medicare Other | Admitting: Pulmonary Disease

## 2022-05-09 VITALS — BP 122/76 | HR 76 | Ht 65.0 in | Wt 248.0 lb

## 2022-05-09 DIAGNOSIS — J449 Chronic obstructive pulmonary disease, unspecified: Secondary | ICD-10-CM | POA: Diagnosis not present

## 2022-05-09 DIAGNOSIS — G4733 Obstructive sleep apnea (adult) (pediatric): Secondary | ICD-10-CM | POA: Diagnosis not present

## 2022-05-09 MED ORDER — PREDNISONE 10 MG PO TABS: 40.0000 mg | ORAL_TABLET | Freq: Every day | ORAL | 0 refills | Status: AC

## 2022-05-09 MED ORDER — HYDROCODONE BIT-HOMATROP MBR 5-1.5 MG/5ML PO SOLN: 5.0000 mL | Freq: Four times a day (QID) | ORAL | 0 refills | Status: DC | PRN

## 2022-05-09 NOTE — Progress Notes (Unsigned)
Subjective:   PATIENT ID: Derrick Alexander GENDER: male DOB: 06-25-1945, MRN: [id number]   HPI  Chief Complaint  Patient presents with   Hospitalization Follow-up    Reason for Visit: Follow-up for shortness of breath  [name] is a 76 year old male former smoker with COPD, OSA, atrial fibrillation, history of VT, hypertension who presents for follow-up for COPD  Initial consult He was previously seen by Dr. Gwenlyn Perking pulmonary on 08/01/2017.  He has been lost to follow-up since then. He reports worsening shortness of breath in the last six months. He reports difficulty walking and carrying things without feeling breathless. Has to take breaks when walking down the driveway. Walks slowly up the stairs. Shortness of breath is associated with wheezing with exertion. Minimal cough. Does wheeze at night. Wears CPAP on average 5 hours at night.  He is currently not on inhalers. He reports weight gain 20 lb in the last 6 months. Not active at baseline.  11/15/2021 Since our last visit he has been compliant with Spiriva. However not sure if he feels a difference. Sometimes standing for long periods of time including in the shower, he has difficulty and will need rest. He does walk a few minutes to the shed. Denies wheezing or coughing.  02/14/22 Since our last visit, he has been off Stiolto for two weeks. He is not planning to buy any meds due to cost. He is hoping that he doesn't need to go to the hospital by the end of the year. When he was on it he had improved shortness of breath and improved wheezing. Now his wife reports he is noticeably more short of breath and not able to walk as far. He is compliant with his CPAP. He does not know if his quality of sleep is improved but has noticed that he is not requiring as many naps during the day and no longer falling asleep while talking.   05/09/22 Since our last visit he was recently hospitalized last week for GI bleed secondary to multiple  AVMs treated with APC. Since then his cough has worsened. Was not treated for COPD exacerbation while inpatient and had no O2 needs. No fevers or chills. Swallows ok. Associated with some wheezing and shortness of breath that worsens at night. Not active at baseline but has not been for the majority of this year. Wife present and agrees that has had minimal activity.  Social History: Former smoker. Quit in 2017. >99 pack years. Previously 3 packs daily Previously broke his back as a teenager on trampoline  Past Medical History:  Diagnosis Date   A-fib ([place])    Abnormal EKG 06/18/2015   COPD (chronic obstructive pulmonary disease) (HCC)    COPD with exacerbation ([place])    Faintness 06/18/2015   Hypertension    LBBB (left bundle branch block) 06/18/2015   Obesity    Syncope 08/06/2015   Tobacco use    V-tach ([place]) 08/06/2015     Family History  Problem Relation Age of Onset   Heart disease Brother    COPD Brother    Diabetes Mother      Social History   Occupational History   Not on file  Tobacco Use   Smoking status: Former    Packs/day: 2.00    Years: 60.00    Total pack years: 120.00    Types: Cigarettes    Quit date: 06/13/2017    Years since quitting: 4.9   Smokeless tobacco: Never  Vaping  Use   Vaping Use: Never used  Substance and Sexual Activity   Alcohol use: No    Alcohol/week: 0.0 standard drinks of alcohol   Drug use: No   Sexual activity: Not on file    Allergies  Allergen Reactions   Amiodarone Nausea Only    Nausea, abdominal pain, SOB, dizziness   Diltiazem Shortness Of Breath   Flecainide Shortness Of Breath     Outpatient Medications Prior to Visit  Medication Sig Dispense Refill   albuterol (PROVENTIL) (2.5 MG/3ML) 0.083% nebulizer solution Take 3 mLs (2.5 mg total) by nebulization every 4 (four) hours as needed for wheezing or shortness of breath. 75 mL 12   albuterol (VENTOLIN HFA) 108 (90 Base) MCG/ACT inhaler Inhale 2 puffs into the lungs  every 6 (six) hours as needed for wheezing or shortness of breath. 8 g 6   cetirizine (ZYRTEC) 10 MG tablet Take 10 mg by mouth daily.     famotidine (PEPCID) 20 MG tablet Take 20 mg by mouth at bedtime.      fluticasone-salmeterol (ADVAIR DISKUS) 250-50 MCG/ACT AEPB Inhale 1 puff into the lungs in the morning and at bedtime. 60 each 5   furosemide (LASIX) 40 MG tablet Take 1.5 tablets (60 mg total) by mouth 2 (two) times daily. 270 tablet 1   guaiFENesin (MUCINEX) 600 MG 12 hr tablet Take by mouth 2 (two) times daily as needed.     latanoprost (XALATAN) 0.005 % ophthalmic solution Place 1 drop into both eyes at bedtime.     pantoprazole (PROTONIX) 40 MG tablet Take 1 tablet (40 mg total) by mouth daily. 30 tablet 1   rosuvastatin (CRESTOR) 20 MG tablet Take 20 mg by mouth daily.     sodium chloride (OCEAN) 0.65 % SOLN nasal spray Place 1 spray into both nostrils as needed for congestion.     spironolactone (ALDACTONE) 25 MG tablet Take 1 tablet (25 mg total) by mouth daily. 90 tablet 3   XARELTO 20 MG TABS tablet TAKE 1 TABLET BY MOUTH EVERY DAY WITH DINNER 90 tablet 1   No facility-administered medications prior to visit.    Review of Systems  Constitutional:  Negative for chills, diaphoresis, fever, malaise/fatigue and weight loss.  HENT:  Negative for congestion.   Respiratory:  Positive for cough, shortness of breath and wheezing. Negative for hemoptysis and sputum production.   Cardiovascular:  Negative for chest pain, palpitations and leg swelling.     Objective:   Vitals:   05/09/22 0857  BP: 122/76  Pulse: 76  SpO2: 99%  Weight: 248 lb (112.5 kg)  Height: '5\' 5"'$  (1.651 m)      Physical Exam: General: Well-appearing, no acute distress HENT: Derrick Alexander, AT Eyes: EOMI, no scleral icterus Respiratory: Diminished air entry bilaterally.  No crackles, wheezing or rales Cardiovascular: RRR, -M/R/G, no JVD Extremities:-Edema,-tenderness Neuro: AAO x4, CNII-XII grossly intact Psych:  Normal mood, normal affect  Data Reviewed:  Imaging: CXR 06/19/2017 mild cardiomegaly.  No infiltrate, effusion or edema. CT cardiac 09/10/2020-CAC score 361 which is 50th percentile.  Visualized lung parenchyma normal, no evidence pulmonary nodules/masses. CXR 04/29/22 - No acute issues  PFT: 08/01/2017 FVC 2.38 (74%) FEV1 1.73 (75%) ratio 71 TLC 103% RV 150% RV/TLC 147% DLCO 101% Interpretation mild obstructive lung disease with air trapping present normal gas exchange.  No significant bronchodilator response however does not preclude benefit of therapy.  Labs: CBC    Component Value Date/Time   WBC 7.7 05/05/2022 0956  WBC 9.0 04/30/2022 0535   RBC 2.68 (LL) 05/05/2022 0956   RBC 2.57 (L) 04/30/2022 0535   HGB 7.8 (L) 05/05/2022 0956   HCT 23.7 (L) 05/05/2022 0956   PLT 212 05/05/2022 0956   MCV 88 05/05/2022 0956   MCH 29.1 05/05/2022 0956   MCH 30.7 04/30/2022 0535   MCHC 32.9 05/05/2022 0956   MCHC 31.7 04/30/2022 0535   RDW 14.9 05/05/2022 0956   LYMPHSABS 1.7 04/27/2022 1116   MONOABS 1.0 04/27/2022 1116   EOSABS 0.1 04/27/2022 1116   BASOSABS 0.1 04/27/2022 1116   Absolute eos  08/01/2017-100 04/27/22 -100    Assessment & Plan:   Discussion: 76 year old male with COPD, atrial fibrillation, hx VT, HTN and recent GI bleed who presents for follow-up. Worsening respiratory symptoms since his EGD. Compliant with his CPAP. No desaturations on ambulatory O2 today  Prior inhalers: Stiolto - Dc'd due to cost  COPD exacerbation --Prednisone 40 mg x 5 days --Hycodan cough syrup  COPD --Refer to pulmonary rehab --CONTINUE Wixela 250-50 mcg ONE puff in the morning and in the evening. Rinse mouth out after use --CONTINUE Albuterol AS NEEDED for shortness of breath or wheezing. OK to use prior to exercise --Encourage regular aerobic exercise up to 20 minutes daily   OSA - managed by PCP Self-reports compliance with CPAP --Counseled on sleep hygiene --Counseled on  weight loss/maintenance of healthy weight --Counseled NOT to drive if/when sleepy --Advised patient to wear CPAP for at least 4 hours each night for greater than 70% of the time to avoid the machine being repossessed by insurance.  Health Maintenance Immunization History  Administered Date(s) Administered   Influenza, High Dose Seasonal PF 06/14/2017   Influenza, Quadrivalent, Recombinant, Inj, Pf 06/14/2017, 01/29/2019   Pneumococcal Conjugate-13 12/12/2018   Pneumococcal Polysaccharide-23 06/14/2017   Tdap 11/06/2017   CT Lung Screen - not qualified. Due to age  Orders Placed This Encounter  Procedures   AMB referral to pulmonary rehabilitation    Referral Priority:   Routine    Referral Type:   Consultation    Number of Visits Requested:   1   Meds ordered this encounter  Medications   predniSONE (DELTASONE) 10 MG tablet    Sig: Take 4 tablets (40 mg total) by mouth daily with breakfast for 5 days.    Dispense:  20 tablet    Refill:  0   HYDROcodone bit-homatropine (HYCODAN) 5-1.5 MG/5ML syrup    Sig: Take 5 mLs by mouth every 6 (six) hours as needed for cough.    Dispense:  120 mL    Refill:  0    Return in about 3 months (around 08/08/2022).  I have spent a total time of 31-minutes on the day of the appointment including chart review, data review, collecting history, coordinating care and discussing medical diagnosis and plan with the patient/family. Past medical history, allergies, medications were reviewed. Pertinent imaging, labs and tests included in this note have been reviewed and interpreted independently by me.  Derrick Matura Rodman Pickle, MD [facility] Office Number [phone]

## 2022-05-09 NOTE — Patient Instructions (Addendum)
COPD exacerbation --Prednisone 40 mg x 5 days --Hycodan cough syrup  COPD --Refer to pulmonary rehab --CONTINUE Wixela 250-50 mcg ONE puff in the morning and in the evening. Rinse mouth out after use --CONTINUE Albuterol AS NEEDED for shortness of breath or wheezing. OK to use prior to exercise --Encourage regular aerobic exercise up to 20 minutes daily   Follow-up with me in 3 months

## 2022-05-10 ENCOUNTER — Encounter (HOSPITAL_BASED_OUTPATIENT_CLINIC_OR_DEPARTMENT_OTHER): Payer: Self-pay | Admitting: Pulmonary Disease

## 2022-05-10 ENCOUNTER — Encounter (HOSPITAL_COMMUNITY): Payer: Self-pay

## 2022-05-12 ENCOUNTER — Other Ambulatory Visit: Payer: Self-pay | Admitting: Internal Medicine

## 2022-05-17 DIAGNOSIS — K31811 Angiodysplasia of stomach and duodenum with bleeding: Secondary | ICD-10-CM | POA: Diagnosis not present

## 2022-05-17 DIAGNOSIS — K921 Melena: Secondary | ICD-10-CM | POA: Diagnosis not present

## 2022-05-17 DIAGNOSIS — D509 Iron deficiency anemia, unspecified: Secondary | ICD-10-CM | POA: Diagnosis not present

## 2022-05-23 ENCOUNTER — Telehealth: Payer: Self-pay | Admitting: Pulmonary Disease

## 2022-05-23 NOTE — Telephone Encounter (Signed)
PT wife is calling. Issues with PT's inhaler, Wixcla, making him nervous, sleepless, jittery, like he has MS . Please call to advise. By process of elimination they have determined it is this med, she said. Please call PT wife @ [phone]

## 2022-05-23 NOTE — Telephone Encounter (Signed)
Spoke with [name] (pt's wife per DPR) who states Derrick Alexander gives husband the jitters. He can't stop shaking his hands and or feet after taking inhaler. [name] did notice that when pt was not taking Wixela for a 2 week period that the pt's jitteryness seemed to stop. Once pt restarted Wixela jitters came back. [name] would like to know if there are any alternative inhalers that may not cause jitters. Dr. Raynelle Fanning please advise.

## 2022-05-24 NOTE — Telephone Encounter (Signed)
I contacted patient and his wife and reports that he is having full body tremors especially in the legs. No limitation in activity. No change in mentation. No respiratory issues after holding Advair for 48 hours. Of note, patient did not have any tremors with prior Advair use. I offered non-steroid inhaler options however patient declined as he feels his breathing is fine. Advised patient to contact PCP regarding tremors.

## 2022-06-01 NOTE — Progress Notes (Addendum)
Cardiology Office Note Date:  06/02/2022  Patient ID:  Derrick Alexander, Derrick Alexander [birth year], MRN [id number] PCP:  [name], MD  Cardiologist:  Pixie Casino, MD Electrophysiologist: Constance Haw, MD    Chief Complaint: PVC/Afib f/up  History of Present Illness: Derrick Alexander is a 77 y.o. male with PMH notable for remote NSVT, parox AF on [place], h/o GI bleed, PVCs, HTN; seen today for Will [name], MD for routine electrophysiology followup. Since last being seen in our clinic the patient reports doing much better.    Briefly, he had episodes of syncope ~2017, wore monitor that showed 16-beat run of NSVT and bradycardia down to 30s when sleeping. He also has h/o AF, failed flecainide and amio. s/p PVI AF ablation 08/2020   Saw Dr. Curt Bears 01/2022 doing well, had baseline SOB but no other major compalints. Found to have PVCs on EKG. He wore a zio monitor at that time that showed 9% AF burden, 13% PVC burden. He was still having SOB, so an echo was recommended. EF normal, so no change in plan.  He was recently admitted for GI bleed in 04/2022, found to have gastric AVMs, xarelto was resumed at DC. I saw him for post-hosp follow-up and slowly recovering, H/H still low, low exercise tolerance.  Today, he reports that he is feeling much better, able to take the trashc an to the street without having to take a break. His wife noticed his BP was started to be in the 140/150s at home, so he restarted his irbesartan. Since restarting, BP is in 120s range. He denies melena, taking xarelto without other bleeding concerns. Continues to have BLE edema, improved from prior.   he denies chest pain, palpitations, dyspnea, PND, orthopnea, nausea, vomiting, dizziness, syncope, edema, weight gain, or early satiety.    AAD History: AF diag 2022 s/p colonoscopy; flecainide stopped d/t junctional brady; Amio stopped d/t GI upset  Past Medical History:  Diagnosis Date   A-fib ([place])    Abnormal EKG  06/18/2015   COPD (chronic obstructive pulmonary disease) (HCC)    COPD with exacerbation ([place])    Faintness 06/18/2015   Hypertension    LBBB (left bundle branch block) 06/18/2015   Obesity    Syncope 08/06/2015   Tobacco use    V-tach ([place]) 08/06/2015    Past Surgical History:  Procedure Laterality Date   ATRIAL FIBRILLATION ABLATION N/A 09/17/2020   Procedure: ATRIAL FIBRILLATION ABLATION;  Surgeon: Constance Haw, MD;  Location: [facility];  Service: Cardiovascular;  Laterality: N/A;   COLONOSCOPY WITH PROPOFOL N/A 03/07/2019   Procedure: COLONOSCOPY WITH PROPOFOL;  Surgeon: Carol Ada, MD;  Location: [facility];  Service: Endoscopy;  Laterality: N/A;   ESOPHAGOGASTRODUODENOSCOPY (EGD) WITH PROPOFOL N/A 04/28/2022   Procedure: ESOPHAGOGASTRODUODENOSCOPY (EGD) WITH PROPOFOL;  Surgeon: Carol Ada, MD;  Location: [facility];  Service: Gastroenterology;  Laterality: N/A;   HOT HEMOSTASIS N/A 04/28/2022   Procedure: HOT HEMOSTASIS (ARGON PLASMA COAGULATION/BICAP);  Surgeon: Carol Ada, MD;  Location: [facility];  Service: Gastroenterology;  Laterality: N/A;   POLYPECTOMY  03/07/2019   Procedure: POLYPECTOMY;  Surgeon: Carol Ada, MD;  Location: [facility];  Service: Endoscopy;;    Current Outpatient Medications  Medication Instructions   albuterol (PROVENTIL) 2.5 mg, Nebulization, Every 4 hours PRN   albuterol (VENTOLIN HFA) 108 (90 Base) MCG/ACT inhaler 2 puffs, Inhalation, Every 6 hours PRN   cetirizine (ZYRTEC) 10 mg, Oral, Daily   famotidine (PEPCID) 20 mg, Oral, Daily at  bedtime   fluticasone-salmeterol (ADVAIR DISKUS) 250-50 MCG/ACT AEPB 1 puff, Inhalation, 2 times daily   furosemide (LASIX) 60 mg, Oral, 2 times daily   guaiFENesin (MUCINEX) 600 MG 12 hr tablet Oral, 2 times daily PRN   HYDROcodone bit-homatropine (HYCODAN) 5-1.5 MG/5ML syrup 5 mLs, Oral, Every 6 hours PRN   irbesartan (AVAPRO) 300 mg, Oral, Daily   latanoprost (XALATAN) 0.005 %  ophthalmic solution 1 drop, Both Eyes, Daily at bedtime   pantoprazole (PROTONIX) 40 mg, Oral, Daily   rosuvastatin (CRESTOR) 20 mg, Oral, Daily   sodium chloride (OCEAN) 0.65 % SOLN nasal spray 1 spray, Each Nare, As needed   spironolactone (ALDACTONE) 25 mg, Oral, Daily   XARELTO 20 MG TABS tablet TAKE 1 TABLET BY MOUTH EVERY DAY WITH DINNER      Social History:  The patient  reports that he quit smoking about 4 years ago. His smoking use included cigarettes. He has a 120.00 pack-year smoking history. He has never used smokeless tobacco. He reports that he does not drink alcohol and does not use drugs.   Family History:  The patient's family history includes COPD in his brother; Diabetes in his mother; Heart disease in his brother.  ROS:  Please see the history of present illness. All other systems are reviewed and otherwise negative.   PHYSICAL EXAM:  VS:  BP 128/68   Pulse 64   Ht '5\' 6"'$  (1.676 m)   Wt 248 lb (112.5 kg)   SpO2 95%   BMI 40.03 kg/m  BMI: Body mass index is 40.03 kg/m.  GEN- The patient is well appearing, alert and oriented x 3 today.   HEENT: normocephalic, atraumatic; sclera clear, conjunctiva pink; hearing intact; oropharynx clear; neck supple, no JVP Lungs- Clear to ausculation bilaterally, normal work of breathing.  No wheezes, rales, rhonchi Heart- Regular rate and rhythm, systolic murmur heard at RSB, no rubs or gallops, PMI not laterally displaced GI- soft, non-tender, non-distended, bowel sounds present, no hepatosplenomegaly Extremities- 1-2+ peripheral edema. no clubbing or cyanosis; DP/PT/radial pulses 2+ bilaterally MS- no significant deformity or atrophy Skin- warm and dry, no rash or lesion Psych- euthymic mood, full affect Neuro- strength and sensation are intact   EKG is ordered. Personal review of EKG from today shows:  NSR, rate 64bpm; RBBB, no PVCs  Recent Labs: 04/29/2022: B Natriuretic Peptide 393.6 04/30/2022: Magnesium  2.4 05/05/2022: ALT 22; BUN 10; Creatinine, Ser 1.07; Hemoglobin 7.8; NT-Pro BNP 756; Platelets 212; Potassium 4.1; Sodium 138  No results found for requested labs within last 365 days.   CrCl cannot be calculated (Patient's most recent lab result is older than the maximum 21 days allowed.).   Wt Readings from Last 3 Encounters:  06/02/22 248 lb (112.5 kg)  05/09/22 248 lb (112.5 kg)  05/05/22 255 lb 12.8 oz (116 kg)     Additional studies reviewed include: Previous EP, cardiology notes.   TTE 03/27/2022  1. Left ventricular ejection fraction, by estimation, is 60 to 65%. The left ventricle has normal function. The left ventricle has no regional wall motion abnormalities. There is moderate left ventricular hypertrophy. Left ventricular diastolic parameters are consistent with Grade II diastolic dysfunction (pseudonormalization). Elevated left ventricular end-diastolic pressure. The E/e' is 34.   2. Right ventricular systolic function is normal. The right ventricular size is normal. There is mildly elevated pulmonary artery systolic pressure. The estimated right ventricular systolic pressure is 65.9 mmHg.   3. The mitral valve is abnormal. Trivial mitral valve regurgitation.  4. The aortic valve is calcified. Aortic valve regurgitation is not visualized. Moderate aortic valve stenosis. Aortic valve area, by VTI measures 1.06 cm. Aortic valve mean gradient measures 26.4 mmHg. Aortic valve Vmax measures 3.62 m/s.   5. The inferior vena cava is normal in size with <50% respiratory variability, suggesting right atrial pressure of 8 mmHg.    Zio 02/2022 Patch Wear Time:  5 days and 2 hours   Predominant rhythm was sinus rhythm 9% atrial fibrillation burden. 1.3% supraventricular ectopy 12.1% ventricular ectopy No triggered episodes recorded   ASSESSMENT AND PLAN:  #) parox AF  Hypercoagulable d/t AF #) recent GI Bleed  S/p AF ablation 08/2020 by Dr. Curt Bears Recently wore zio that  showed 9% AF burden   CHA2DS2-VASc Score = 4 [CHF History: 1, HTN History: 1, Diabetes History: 0, Stroke History: 0, Vascular Disease History: 0, Age Score: 2, Gender Score: 0].  Therefore, the patient's annual risk of stroke is 4.8 %.     AC - Xarelto '20mg'$  daily, appropriately dosed; CrCl 92 - getting updated labs w PCP next week, will obtain results     #) PVCs By EKG at OV appts  Wore zio showing 12% PVC burden No symptoms currently ? Whether associated with GI bleed episode - consider BB if burden increases   #) NSVT no further episodes, no syncope   #) HTN Well-controlled after resumption of irbesartan   #) Grade 2 DD Volume status appears stable for patient.  Recent echo shows preserved EF He has not seen gen cards in some time - I've recommended he follow-up with Dr. Debara Pickett for ongoing mgmt Discussed limiting dietary indiscretions, salt in diet, increasing walking as tolerated.   Current medicines are reviewed at length with the patient today.   The patient does not have concerns regarding his medicines.  The following changes were made today:  none  Labs/ tests ordered today include:  Orders Placed This Encounter  Procedures   EKG 12-Lead     Disposition: Follow up with Dr. Curt Bears in in 6 months   Signed, Mamie Levers, NP  06/02/22 1:58 PM   [address] [place] [address] [phone] (office)  [phone] (fax)

## 2022-06-02 ENCOUNTER — Ambulatory Visit: Payer: Medicare Other | Attending: Student | Admitting: Cardiology

## 2022-06-02 ENCOUNTER — Other Ambulatory Visit: Payer: Self-pay | Admitting: Cardiology

## 2022-06-02 ENCOUNTER — Encounter: Payer: Self-pay | Admitting: Student

## 2022-06-02 VITALS — BP 128/68 | HR 64 | Ht 66.0 in | Wt 248.0 lb

## 2022-06-02 DIAGNOSIS — D6869 Other thrombophilia: Secondary | ICD-10-CM

## 2022-06-02 DIAGNOSIS — I10 Essential (primary) hypertension: Secondary | ICD-10-CM | POA: Diagnosis not present

## 2022-06-02 DIAGNOSIS — I480 Paroxysmal atrial fibrillation: Secondary | ICD-10-CM

## 2022-06-02 DIAGNOSIS — I447 Left bundle-branch block, unspecified: Secondary | ICD-10-CM | POA: Diagnosis not present

## 2022-06-02 DIAGNOSIS — I4729 Other ventricular tachycardia: Secondary | ICD-10-CM | POA: Diagnosis not present

## 2022-06-02 DIAGNOSIS — Z8719 Personal history of other diseases of the digestive system: Secondary | ICD-10-CM | POA: Diagnosis not present

## 2022-06-02 DIAGNOSIS — I493 Ventricular premature depolarization: Secondary | ICD-10-CM

## 2022-06-02 NOTE — Patient Instructions (Signed)
Medication Instructions:  Your physician recommends that you continue on your current medications as directed. Please refer to the Current Medication list given to you today.  *If you need a refill on your cardiac medications before your next appointment, please call your pharmacy*   Lab Work: None If you have labs (blood work) drawn today and your tests are completely normal, you will receive your results only by: [place] (if you have MyChart) OR A paper copy in the mail If you have any lab test that is abnormal or we need to change your treatment, we will call you to review the results.   Follow-Up: At [facility], you and your health needs are our priority.  As part of our continuing mission to provide you with exceptional heart care, we have created designated Provider Care Teams.  These Care Teams include your primary Cardiologist (physician) and Advanced Practice Providers (APPs -  Physician Assistants and Nurse Practitioners) who all work together to provide you with the care you need, when you need it.   Your next appointment:   6 month(s)  Provider:   Allegra Lai, MD    Other Instructions Have your Primary Care send [place] a copy of your lab results. Our fax# [phone].

## 2022-06-04 DIAGNOSIS — J410 Simple chronic bronchitis: Secondary | ICD-10-CM | POA: Diagnosis not present

## 2022-06-06 DIAGNOSIS — D500 Iron deficiency anemia secondary to blood loss (chronic): Secondary | ICD-10-CM | POA: Diagnosis not present

## 2022-06-07 DIAGNOSIS — D500 Iron deficiency anemia secondary to blood loss (chronic): Secondary | ICD-10-CM | POA: Diagnosis not present

## 2022-06-07 DIAGNOSIS — I10 Essential (primary) hypertension: Secondary | ICD-10-CM | POA: Diagnosis not present

## 2022-06-14 ENCOUNTER — Telehealth (HOSPITAL_COMMUNITY): Payer: Self-pay

## 2022-06-14 NOTE — Telephone Encounter (Signed)
No response from pt.  Closed referral  

## 2022-07-05 DIAGNOSIS — J410 Simple chronic bronchitis: Secondary | ICD-10-CM | POA: Diagnosis not present

## 2022-07-06 ENCOUNTER — Encounter (HOSPITAL_COMMUNITY): Payer: Self-pay | Admitting: Internal Medicine

## 2022-07-06 ENCOUNTER — Emergency Department (HOSPITAL_COMMUNITY): Payer: Medicare Other

## 2022-07-06 ENCOUNTER — Inpatient Hospital Stay (HOSPITAL_COMMUNITY)
Admission: EM | Admit: 2022-07-06 | Discharge: 2022-07-12 | DRG: 377 | Disposition: A | Discharge status: Home Health | Payer: Medicare Other | Attending: Internal Medicine | Admitting: Internal Medicine

## 2022-07-06 DIAGNOSIS — R404 Transient alteration of awareness: Secondary | ICD-10-CM | POA: Diagnosis not present

## 2022-07-06 DIAGNOSIS — I471 Supraventricular tachycardia, unspecified: Secondary | ICD-10-CM | POA: Diagnosis present

## 2022-07-06 DIAGNOSIS — I700 Atherosclerosis of aorta: Secondary | ICD-10-CM | POA: Diagnosis present

## 2022-07-06 DIAGNOSIS — I472 Ventricular tachycardia, unspecified: Secondary | ICD-10-CM | POA: Diagnosis present

## 2022-07-06 DIAGNOSIS — Z833 Family history of diabetes mellitus: Secondary | ICD-10-CM

## 2022-07-06 DIAGNOSIS — D62 Acute posthemorrhagic anemia: Secondary | ICD-10-CM | POA: Diagnosis present

## 2022-07-06 DIAGNOSIS — E785 Hyperlipidemia, unspecified: Secondary | ICD-10-CM | POA: Diagnosis not present

## 2022-07-06 DIAGNOSIS — K922 Gastrointestinal hemorrhage, unspecified: Secondary | ICD-10-CM

## 2022-07-06 DIAGNOSIS — I5032 Chronic diastolic (congestive) heart failure: Secondary | ICD-10-CM | POA: Insufficient documentation

## 2022-07-06 DIAGNOSIS — R0602 Shortness of breath: Secondary | ICD-10-CM

## 2022-07-06 DIAGNOSIS — I110 Hypertensive heart disease with heart failure: Secondary | ICD-10-CM | POA: Diagnosis not present

## 2022-07-06 DIAGNOSIS — K559 Vascular disorder of intestine, unspecified: Secondary | ICD-10-CM | POA: Diagnosis present

## 2022-07-06 DIAGNOSIS — H409 Unspecified glaucoma: Secondary | ICD-10-CM | POA: Diagnosis not present

## 2022-07-06 DIAGNOSIS — K921 Melena: Secondary | ICD-10-CM | POA: Diagnosis not present

## 2022-07-06 DIAGNOSIS — R6889 Other general symptoms and signs: Secondary | ICD-10-CM | POA: Diagnosis not present

## 2022-07-06 DIAGNOSIS — I5033 Acute on chronic diastolic (congestive) heart failure: Secondary | ICD-10-CM | POA: Insufficient documentation

## 2022-07-06 DIAGNOSIS — Z6841 Body Mass Index (BMI) 40.0 and over, adult: Secondary | ICD-10-CM

## 2022-07-06 DIAGNOSIS — I509 Heart failure, unspecified: Secondary | ICD-10-CM | POA: Diagnosis not present

## 2022-07-06 DIAGNOSIS — G4733 Obstructive sleep apnea (adult) (pediatric): Secondary | ICD-10-CM | POA: Diagnosis not present

## 2022-07-06 DIAGNOSIS — I4819 Other persistent atrial fibrillation: Secondary | ICD-10-CM | POA: Diagnosis not present

## 2022-07-06 DIAGNOSIS — R079 Chest pain, unspecified: Secondary | ICD-10-CM | POA: Diagnosis not present

## 2022-07-06 DIAGNOSIS — E871 Hypo-osmolality and hyponatremia: Secondary | ICD-10-CM | POA: Diagnosis not present

## 2022-07-06 DIAGNOSIS — R0603 Acute respiratory distress: Secondary | ICD-10-CM | POA: Diagnosis present

## 2022-07-06 DIAGNOSIS — R7989 Other specified abnormal findings of blood chemistry: Secondary | ICD-10-CM | POA: Insufficient documentation

## 2022-07-06 DIAGNOSIS — K633 Ulcer of intestine: Secondary | ICD-10-CM | POA: Diagnosis not present

## 2022-07-06 DIAGNOSIS — D124 Benign neoplasm of descending colon: Secondary | ICD-10-CM | POA: Diagnosis not present

## 2022-07-06 DIAGNOSIS — I452 Bifascicular block: Secondary | ICD-10-CM | POA: Diagnosis present

## 2022-07-06 DIAGNOSIS — K31819 Angiodysplasia of stomach and duodenum without bleeding: Secondary | ICD-10-CM | POA: Diagnosis not present

## 2022-07-06 DIAGNOSIS — Z9989 Dependence on other enabling machines and devices: Secondary | ICD-10-CM | POA: Diagnosis not present

## 2022-07-06 DIAGNOSIS — I499 Cardiac arrhythmia, unspecified: Secondary | ICD-10-CM | POA: Diagnosis not present

## 2022-07-06 DIAGNOSIS — R944 Abnormal results of kidney function studies: Secondary | ICD-10-CM | POA: Diagnosis present

## 2022-07-06 DIAGNOSIS — I21A1 Myocardial infarction type 2: Secondary | ICD-10-CM | POA: Diagnosis present

## 2022-07-06 DIAGNOSIS — D500 Iron deficiency anemia secondary to blood loss (chronic): Secondary | ICD-10-CM | POA: Diagnosis not present

## 2022-07-06 DIAGNOSIS — Z79899 Other long term (current) drug therapy: Secondary | ICD-10-CM

## 2022-07-06 DIAGNOSIS — D509 Iron deficiency anemia, unspecified: Secondary | ICD-10-CM | POA: Diagnosis not present

## 2022-07-06 DIAGNOSIS — Z1152 Encounter for screening for COVID-19: Secondary | ICD-10-CM | POA: Diagnosis not present

## 2022-07-06 DIAGNOSIS — K529 Noninfective gastroenteritis and colitis, unspecified: Secondary | ICD-10-CM | POA: Diagnosis not present

## 2022-07-06 DIAGNOSIS — I4891 Unspecified atrial fibrillation: Secondary | ICD-10-CM | POA: Diagnosis not present

## 2022-07-06 DIAGNOSIS — Z7901 Long term (current) use of anticoagulants: Secondary | ICD-10-CM | POA: Diagnosis not present

## 2022-07-06 DIAGNOSIS — R9431 Abnormal electrocardiogram [ECG] [EKG]: Secondary | ICD-10-CM | POA: Insufficient documentation

## 2022-07-06 DIAGNOSIS — I10 Essential (primary) hypertension: Secondary | ICD-10-CM | POA: Diagnosis present

## 2022-07-06 DIAGNOSIS — J449 Chronic obstructive pulmonary disease, unspecified: Secondary | ICD-10-CM | POA: Diagnosis not present

## 2022-07-06 DIAGNOSIS — D72829 Elevated white blood cell count, unspecified: Secondary | ICD-10-CM | POA: Diagnosis present

## 2022-07-06 DIAGNOSIS — I480 Paroxysmal atrial fibrillation: Secondary | ICD-10-CM | POA: Diagnosis present

## 2022-07-06 DIAGNOSIS — Z743 Need for continuous supervision: Secondary | ICD-10-CM | POA: Diagnosis not present

## 2022-07-06 DIAGNOSIS — H5789 Other specified disorders of eye and adnexa: Secondary | ICD-10-CM | POA: Diagnosis present

## 2022-07-06 DIAGNOSIS — R0789 Other chest pain: Secondary | ICD-10-CM | POA: Diagnosis not present

## 2022-07-06 DIAGNOSIS — K5731 Diverticulosis of large intestine without perforation or abscess with bleeding: Principal | ICD-10-CM | POA: Diagnosis present

## 2022-07-06 DIAGNOSIS — K635 Polyp of colon: Secondary | ICD-10-CM | POA: Diagnosis not present

## 2022-07-06 DIAGNOSIS — K3189 Other diseases of stomach and duodenum: Secondary | ICD-10-CM | POA: Diagnosis not present

## 2022-07-06 DIAGNOSIS — R195 Other fecal abnormalities: Secondary | ICD-10-CM | POA: Diagnosis not present

## 2022-07-06 DIAGNOSIS — Z87891 Personal history of nicotine dependence: Secondary | ICD-10-CM

## 2022-07-06 DIAGNOSIS — K760 Fatty (change of) liver, not elsewhere classified: Secondary | ICD-10-CM | POA: Diagnosis present

## 2022-07-06 DIAGNOSIS — D649 Anemia, unspecified: Secondary | ICD-10-CM

## 2022-07-06 DIAGNOSIS — Z888 Allergy status to other drugs, medicaments and biological substances status: Secondary | ICD-10-CM

## 2022-07-06 DIAGNOSIS — I350 Nonrheumatic aortic (valve) stenosis: Secondary | ICD-10-CM | POA: Diagnosis not present

## 2022-07-06 DIAGNOSIS — Z8249 Family history of ischemic heart disease and other diseases of the circulatory system: Secondary | ICD-10-CM

## 2022-07-06 DIAGNOSIS — I493 Ventricular premature depolarization: Secondary | ICD-10-CM | POA: Diagnosis present

## 2022-07-06 DIAGNOSIS — Z825 Family history of asthma and other chronic lower respiratory diseases: Secondary | ICD-10-CM

## 2022-07-06 DIAGNOSIS — R072 Precordial pain: Secondary | ICD-10-CM

## 2022-07-06 HISTORY — DX: Other ill-defined heart diseases: I51.89

## 2022-07-06 HISTORY — DX: Atherosclerosis of aorta: I70.0

## 2022-07-06 HISTORY — DX: Obstructive sleep apnea (adult) (pediatric): G47.33

## 2022-07-06 HISTORY — DX: Bradycardia, unspecified: R00.1

## 2022-07-06 HISTORY — DX: Gastrointestinal hemorrhage, unspecified: K92.2

## 2022-07-06 HISTORY — DX: Ventricular premature depolarization: I49.3

## 2022-07-06 HISTORY — DX: Angiodysplasia of colon without hemorrhage: K55.20

## 2022-07-06 HISTORY — DX: Other ventricular tachycardia: I47.29

## 2022-07-06 HISTORY — DX: Nonrheumatic aortic (valve) stenosis: I35.0

## 2022-07-06 HISTORY — DX: Disorder of arteries and arterioles, unspecified: I77.9

## 2022-07-06 HISTORY — DX: Fatty (change of) liver, not elsewhere classified: K76.0

## 2022-07-06 HISTORY — DX: Bifascicular block: I45.2

## 2022-07-06 LAB — COMPREHENSIVE METABOLIC PANEL
ALT: 12 U/L (ref 0–44)
AST: 38 U/L (ref 15–41)
Albumin: 3.1 g/dL — ABNORMAL LOW (ref 3.5–5.0)
Alkaline Phosphatase: 39 U/L (ref 38–126)
Anion gap: 12 (ref 5–15)
BUN: 36 mg/dL — ABNORMAL HIGH (ref 8–23)
CO2: 19 mmol/L — ABNORMAL LOW (ref 22–32)
Calcium: 8.5 mg/dL — ABNORMAL LOW (ref 8.9–10.3)
Chloride: 104 mmol/L (ref 98–111)
Creatinine, Ser: 1.19 mg/dL (ref 0.61–1.24)
GFR, Estimated: >60 mL/min (ref >60)
Glucose, Bld: 139 mg/dL — ABNORMAL HIGH (ref 70–99)
Potassium: 4.8 mmol/L (ref 3.5–5.1)
Sodium: 135 mmol/L (ref 135–145)
Total Bilirubin: 0.6 mg/dL (ref 0.3–1.2)
Total Protein: 5 g/dL — ABNORMAL LOW (ref 6.5–8.1)

## 2022-07-06 LAB — CBC
HCT: 19.7 % — ABNORMAL LOW (ref 39.0–52.0)
HCT: 23.4 % — ABNORMAL LOW (ref 39.0–52.0)
Hemoglobin: 5.9 g/dL — CL (ref 13.0–17.0)
Hemoglobin: 7.8 g/dL — ABNORMAL LOW (ref 13.0–17.0)
MCH: 28.8 pg (ref 26.0–34.0)
MCH: 30.2 pg (ref 26.0–34.0)
MCHC: 29.9 g/dL — ABNORMAL LOW (ref 30.0–36.0)
MCHC: 33.3 g/dL (ref 30.0–36.0)
MCV: 96.1 fL (ref 80.0–100.0)
MCV: 90.7 fL (ref 80.0–100.0)
Platelets: 181 10*3/uL (ref 150–400)
Platelets: 166 10*3/uL (ref 150–400)
RBC: 2.05 MIL/uL — ABNORMAL LOW (ref 4.22–5.81)
RBC: 2.58 MIL/uL — ABNORMAL LOW (ref 4.22–5.81)
RDW: 19.6 % — ABNORMAL HIGH (ref 11.5–15.5)
RDW: 17.7 % — ABNORMAL HIGH (ref 11.5–15.5)
WBC: 13.6 10*3/uL — ABNORMAL HIGH (ref 4.0–10.5)
WBC: 17 10*3/uL — ABNORMAL HIGH (ref 4.0–10.5)
nRBC: 0.7 % — ABNORMAL HIGH (ref 0.0–0.2)
nRBC: 0.6 % — ABNORMAL HIGH (ref 0.0–0.2)

## 2022-07-06 LAB — RESP PANEL BY RT-PCR (RSV, FLU A&B, COVID)  RVPGX2
Influenza A by PCR: NEGATIVE
Influenza B by PCR: NEGATIVE
Resp Syncytial Virus by PCR: NEGATIVE
SARS Coronavirus 2 by RT PCR: NEGATIVE

## 2022-07-06 LAB — PREPARE RBC (CROSSMATCH)

## 2022-07-06 LAB — BRAIN NATRIURETIC PEPTIDE: B Natriuretic Peptide: 266.1 pg/mL — ABNORMAL HIGH (ref 0.0–100.0)

## 2022-07-06 LAB — TROPONIN I (HIGH SENSITIVITY)
Troponin I (High Sensitivity): 796 ng/L (ref <18)
Troponin I (High Sensitivity): 3612 ng/L (ref <18)

## 2022-07-06 LAB — POC OCCULT BLOOD, ED: Fecal Occult Bld: POSITIVE — AB

## 2022-07-06 MED ORDER — ALBUTEROL SULFATE (2.5 MG/3ML) 0.083% IN NEBU
5.0000 mg | INHALATION_SOLUTION | Freq: Once | RESPIRATORY_TRACT | Status: AC
  Administered 2022-07-06: 5 mg via RESPIRATORY_TRACT
  Filled 2022-07-06: qty 6

## 2022-07-06 MED ORDER — ROSUVASTATIN CALCIUM 20 MG PO TABS
20.0000 mg | ORAL_TABLET | Freq: Every day | ORAL | Status: DC
  Administered 2022-07-07 – 2022-07-12 (×6): 20 mg via ORAL
  Filled 2022-07-06 (×6): qty 1

## 2022-07-06 MED ORDER — SODIUM CHLORIDE 0.9 % IV BOLUS
500.0000 mL | Freq: Once | INTRAVENOUS | Status: AC
  Administered 2022-07-06: 500 mL via INTRAVENOUS

## 2022-07-06 MED ORDER — IPRATROPIUM BROMIDE 0.02 % IN SOLN
0.5000 mg | Freq: Once | RESPIRATORY_TRACT | Status: AC
  Administered 2022-07-06: 0.5 mg via RESPIRATORY_TRACT
  Filled 2022-07-06: qty 2.5

## 2022-07-06 MED ORDER — PANTOPRAZOLE 80MG IVPB - SIMPLE MED
80.0000 mg | Freq: Once | INTRAVENOUS | Status: AC
  Administered 2022-07-06: 80 mg via INTRAVENOUS
  Filled 2022-07-06: qty 100

## 2022-07-06 MED ORDER — SODIUM CHLORIDE 0.9 % IV SOLN: INTRAVENOUS | Status: DC

## 2022-07-06 MED ORDER — PANTOPRAZOLE SODIUM 40 MG IV SOLR
40.0000 mg | Freq: Two times a day (BID) | INTRAVENOUS | Status: DC
  Administered 2022-07-06 – 2022-07-12 (×12): 40 mg via INTRAVENOUS
  Filled 2022-07-06 (×12): qty 10

## 2022-07-06 MED ORDER — MOMETASONE FURO-FORMOTEROL FUM 200-5 MCG/ACT IN AERO
2.0000 | INHALATION_SPRAY | Freq: Two times a day (BID) | RESPIRATORY_TRACT | Status: DC
  Administered 2022-07-07 – 2022-07-12 (×11): 2 via RESPIRATORY_TRACT
  Filled 2022-07-06 (×2): qty 8.8

## 2022-07-06 MED ORDER — POLYVINYL ALCOHOL 1.4 % OP SOLN: Freq: Every day | OPHTHALMIC | Status: DC | PRN

## 2022-07-06 MED ORDER — ALBUTEROL SULFATE (2.5 MG/3ML) 0.083% IN NEBU: 2.5000 mg | INHALATION_SOLUTION | RESPIRATORY_TRACT | Status: DC | PRN

## 2022-07-06 MED ORDER — ACETAMINOPHEN 650 MG RE SUPP: 650.0000 mg | Freq: Four times a day (QID) | RECTAL | Status: DC | PRN

## 2022-07-06 MED ORDER — SODIUM CHLORIDE 0.9% IV SOLUTION: Freq: Once | INTRAVENOUS | Status: AC

## 2022-07-06 MED ORDER — LATANOPROST 0.005 % OP SOLN
1.0000 [drp] | Freq: Every day | OPHTHALMIC | Status: DC
  Administered 2022-07-06 – 2022-07-11 (×6): 1 [drp] via OPHTHALMIC
  Filled 2022-07-06: qty 2.5

## 2022-07-06 MED ORDER — ACETAMINOPHEN 325 MG PO TABS
650.0000 mg | ORAL_TABLET | Freq: Four times a day (QID) | ORAL | Status: DC | PRN
  Administered 2022-07-11 – 2022-07-12 (×2): 650 mg via ORAL
  Filled 2022-07-06 (×2): qty 2

## 2022-07-06 MED ORDER — MORPHINE SULFATE (PF) 2 MG/ML IV SOLN: 2.0000 mg | INTRAVENOUS | Status: DC | PRN

## 2022-07-06 MED ORDER — SODIUM CHLORIDE 0.9% FLUSH
3.0000 mL | Freq: Two times a day (BID) | INTRAVENOUS | Status: DC
  Administered 2022-07-06 – 2022-07-12 (×12): 3 mL via INTRAVENOUS

## 2022-07-06 MED ORDER — LACTATED RINGERS IV SOLN: INTRAVENOUS | Status: DC

## 2022-07-06 MED ORDER — HYDRALAZINE HCL 20 MG/ML IJ SOLN: 5.0000 mg | INTRAMUSCULAR | Status: DC | PRN

## 2022-07-06 NOTE — ED Notes (Signed)
Wife signed for blood consent and patient verbalized consent unable to sign. Wife's name is Copelin Cheuk

## 2022-07-06 NOTE — ED Notes (Signed)
I tried to get patient blood I didn't have any success.

## 2022-07-06 NOTE — ED Notes (Signed)
X-ray at bedside

## 2022-07-06 NOTE — ED Triage Notes (Signed)
Pt BIB [facility] form home for SOB. Pt reports using CPAP during the day on 2/142024 additionally to the nights. PT desated with EMS on NRB so was placed on CPAP sao2 97%. Pt transfer to bipap upon ED arrival. Pt is A&Ox4, diminished lung sounds. Pt denies any recent respiratory infections.

## 2022-07-06 NOTE — Consult Note (Signed)
Reason for Consult: GI bleed and history of bleeding gastric AVM. Referring Physician: [facility]  Ferdinand Cava HPI: This is a 77 year old male with a PMH of bleeding gastric AVMs (EGD with APC on 04/28/2022), CHF, afib, and COPD admitted for worsening SOB.  His symptoms of SOB started rather acutely.  In the ER his HGB was noted to be at 5.9 g/dL, which is a drop from 7.8 g/dL on 05/05/2022.  On a routine basis he uses iron supplementation, but he states that his stools are blacker than usual.  His hemoccult in the ER was positive.  His wife noticed that he was getting progressively weaker and his current presentation was consistent with the prior presentation in December.  Past Medical History:  Diagnosis Date   A-fib [facility])    Abnormal EKG 06/18/2015   COPD (chronic obstructive pulmonary disease) (HCC)    COPD with exacerbation ([place])    Faintness 06/18/2015   Hypertension    LBBB (left bundle branch block) 06/18/2015   Obesity    Syncope 08/06/2015   Tobacco use    V-tach ([place]) 08/06/2015    Past Surgical History:  Procedure Laterality Date   ATRIAL FIBRILLATION ABLATION N/A 09/17/2020   Procedure: ATRIAL FIBRILLATION ABLATION;  Surgeon: Constance Haw, MD;  Location: [place] [facility];  Service: Cardiovascular;  Laterality: N/A;   COLONOSCOPY WITH PROPOFOL N/A 03/07/2019   Procedure: COLONOSCOPY WITH PROPOFOL;  Surgeon: Carol Ada, MD;  Location: [facility];  Service: Endoscopy;  Laterality: N/A;   ESOPHAGOGASTRODUODENOSCOPY (EGD) WITH PROPOFOL N/A 04/28/2022   Procedure: ESOPHAGOGASTRODUODENOSCOPY (EGD) WITH PROPOFOL;  Surgeon: Carol Ada, MD;  Location: [facility];  Service: Gastroenterology;  Laterality: N/A;   HOT HEMOSTASIS N/A 04/28/2022   Procedure: HOT HEMOSTASIS (ARGON PLASMA COAGULATION/BICAP);  Surgeon: Carol Ada, MD;  Location: [facility];  Service: Gastroenterology;  Laterality: N/A;   POLYPECTOMY  03/07/2019   Procedure: POLYPECTOMY;  Surgeon:  Carol Ada, MD;  Location: [facility];  Service: Endoscopy;;    Family History  Problem Relation Age of Onset   Heart disease Brother    COPD Brother    Diabetes Mother     Social History:  reports that he quit smoking about 5 years ago. His smoking use included cigarettes. He has a 120.00 pack-year smoking history. He has never used smokeless tobacco. He reports that he does not drink alcohol and does not use drugs.  Allergies:  Allergies  Allergen Reactions   Cardizem [Diltiazem] Shortness Of Breath   Pacerone [Amiodarone] Shortness Of Breath, Nausea Only and Other (See Comments)    Abdominal pain Dizziness   Tambocor [Flecainide] Shortness Of Breath    Medications: Scheduled:  latanoprost  1 drop Both Eyes QHS   mometasone-formoterol  2 puff Inhalation BID   [START ON 07/07/2022] rosuvastatin  20 mg Oral Daily   sodium chloride flush  3 mL Intravenous Q12H   Continuous:  lactated ringers 100 mL/hr at 07/06/22 1350    Results for orders placed or performed during the hospital encounter of 07/06/22 (from the past 24 hour(s))  Resp panel by RT-PCR (RSV, Flu A&B, Covid) Anterior Nasal Swab     Status: None   Collection Time: 07/06/22  8:08 AM   Specimen: Anterior Nasal Swab  Result Value Ref Range   SARS Coronavirus 2 by RT PCR NEGATIVE NEGATIVE   Influenza A by PCR NEGATIVE NEGATIVE   Influenza B by PCR NEGATIVE NEGATIVE   Resp Syncytial Virus by PCR NEGATIVE NEGATIVE  POC occult blood, ED Provider will collect     Status: Abnormal   Collection Time: 07/06/22  8:16 AM  Result Value Ref Range   Fecal Occult Bld POSITIVE (A) NEGATIVE  CBC     Status: Abnormal   Collection Time: 07/06/22  9:04 AM  Result Value Ref Range   WBC 13.6 (H) 4.0 - 10.5 K/uL   RBC 2.05 (L) 4.22 - 5.81 MIL/uL   Hemoglobin 5.9 (LL) 13.0 - 17.0 g/dL   HCT 19.7 (L) 39.0 - 52.0 %   MCV 96.1 80.0 - 100.0 fL   MCH 28.8 26.0 - 34.0 pg   MCHC 29.9 (L) 30.0 - 36.0 g/dL   RDW 19.6 (H) 11.5 - 15.5  %   Platelets 181 150 - 400 K/uL   nRBC 0.7 (H) 0.0 - 0.2 %  Comprehensive metabolic panel     Status: Abnormal   Collection Time: 07/06/22  9:04 AM  Result Value Ref Range   Sodium 135 135 - 145 mmol/L   Potassium 4.8 3.5 - 5.1 mmol/L   Chloride 104 98 - 111 mmol/L   CO2 19 (L) 22 - 32 mmol/L   Glucose, Bld 139 (H) 70 - 99 mg/dL   BUN 36 (H) 8 - 23 mg/dL   Creatinine, Ser 1.19 0.61 - 1.24 mg/dL   Calcium 8.5 (L) 8.9 - 10.3 mg/dL   Total Protein 5.0 (L) 6.5 - 8.1 g/dL   Albumin 3.1 (L) 3.5 - 5.0 g/dL   AST 38 15 - 41 U/L   ALT 12 0 - 44 U/L   Alkaline Phosphatase 39 38 - 126 U/L   Total Bilirubin 0.6 0.3 - 1.2 mg/dL   GFR, Estimated >60 >60 mL/min   Anion gap 12 5 - 15  Troponin I (High Sensitivity)     Status: Abnormal   Collection Time: 07/06/22  9:04 AM  Result Value Ref Range   Troponin I (High Sensitivity) 796 (HH) <18 ng/L  Brain natriuretic peptide     Status: Abnormal   Collection Time: 07/06/22  9:04 AM  Result Value Ref Range   B Natriuretic Peptide 266.1 (H) 0.0 - 100.0 pg/mL  Type and screen [place]     Status: None (Preliminary result)   Collection Time: 07/06/22  9:04 AM  Result Value Ref Range   ABO/RH(D) O POS    Antibody Screen NEG    Sample Expiration 07/09/2022,2359    Unit Number [id number]    Blood Component Type RED CELLS,LR    Unit division 00    Status of Unit ISSUED    Transfusion Status OK TO TRANSFUSE    Crossmatch Result Compatible    Unit Number [id number]    Blood Component Type RED CELLS,LR    Unit division 00    Status of Unit ISSUED    Transfusion Status OK TO TRANSFUSE    Crossmatch Result      Compatible Performed at [facility], [address] [place], [place] [address]   Prepare RBC (crossmatch)     Status: None   Collection Time: 07/06/22 10:10 AM  Result Value Ref Range   Order Confirmation      ORDER PROCESSED BY BLOOD BANK Performed at [facility], [place] [address],  [place], [place] [address]      DG Chest Port 1 View  Result Date: 07/06/2022 CLINICAL DATA:  Shortness of breath EXAM: PORTABLE CHEST 1 VIEW COMPARISON:  04/29/2022 FINDINGS: Lungs are  clear.  No pleural effusion or pneumothorax. The heart is normal in size. IMPRESSION: No evidence of acute cardiopulmonary disease. Electronically Signed   By: Julian Hy M.D.   On: 07/06/2022 08:39    ROS:  As stated above in the HPI otherwise negative.  Blood pressure 103/88, pulse 89, temperature 99.1 F (37.3 C), temperature source Axillary, resp. rate (!) 21, height 5' 6"$  (1.676 m), weight 112.5 kg, SpO2 100 %.    PE: Gen: NAD, Alert and Oriented HEENT:  [place]/AT, EOMI Neck: Supple, no LAD Lungs: CTA Bilaterally CV: RRR without M/G/R ABD: Soft, NTND, +BS Ext: No C/C/E  Assessment/Plan: 1) Anemia. 2) Heme positive stool. 3) SOB.    It is likely that he is bleeding from gastric AVMs again.  An EGD will be performed with APC.  Plan: 1) EGD with APC tomorrow. 2) Agree with blood transfusions.  Bianco Cange D 07/06/2022, 3:08 PM

## 2022-07-06 NOTE — ED Notes (Signed)
Pt denies any changes in symptoms and VS remain within pt's baseline, no transfusion reaction suspected.

## 2022-07-06 NOTE — H&P (Addendum)
History and Physical    Patient: Derrick Alexander [id number] DOB: 04-May-1946 DOA: 07/06/2022 DOS: the patient was seen and examined on 07/06/2022 PCP: [name], MD  Patient coming from: Home - lives with wife; NOK: Wife, Morrow Fiss, [phone]   Chief Complaint: SOB  HPI: Derrick Alexander is a 77 y.o. male with medical history significant of afib on Xarelto; COPD; OSA on CPAP; HLD; and HTN presenting with SOB.  He was previously admitted from 12/7-10 with acute UGI bleeding from gastric AVMs that were treated with APC.   He has done well until about a week ago and then he started having black stools.  He has been on iron and he was almost back to normal.  He is taking Xarelto.  He is chronically breathless and was at baseline Monday.  Tuesday, it was worse and he could barely move through the house.  He went to the bathroom last night and he collapsed to one leg.  He didn't want to come to the ER last night but agreed to come this AM.  He wears a CPAP at home but is not on home O2.     ER Course:  Weak, SOB, dark stools, Hgb 5.8, likely UGI bleed.  Vague chest tightness, troponin mildly increased.  Dr. Benson Norway consulted and will see.     Review of Systems: As mentioned in the history of present illness. All other systems reviewed and are negative.  Limited by BIPAP.    Past Medical History:  Diagnosis Date   A-fib [facility])    Abnormal EKG 06/18/2015   COPD (chronic obstructive pulmonary disease) (HCC)    COPD with exacerbation ([place])    Faintness 06/18/2015   Hypertension    LBBB (left bundle branch block) 06/18/2015   Obesity    Syncope 08/06/2015   Tobacco use    V-tach ([place]) 08/06/2015   Past Surgical History:  Procedure Laterality Date   ATRIAL FIBRILLATION ABLATION N/A 09/17/2020   Procedure: ATRIAL FIBRILLATION ABLATION;  Surgeon: Constance Haw, MD;  Location: [place] [facility];  Service: Cardiovascular;  Laterality: N/A;   COLONOSCOPY WITH PROPOFOL N/A 03/07/2019    Procedure: COLONOSCOPY WITH PROPOFOL;  Surgeon: Carol Ada, MD;  [facility];  Service: Endoscopy;  Laterality: N/A;   ESOPHAGOGASTRODUODENOSCOPY (EGD) WITH PROPOFOL N/A 04/28/2022   Procedure: ESOPHAGOGASTRODUODENOSCOPY (EGD) WITH PROPOFOL;  Surgeon: Carol Ada, MD;  [facility];  Service: Gastroenterology;  Laterality: N/A;   HOT HEMOSTASIS N/A 04/28/2022   Procedure: HOT HEMOSTASIS (ARGON PLASMA COAGULATION/BICAP);  Surgeon: Carol Ada, MD;  Location[facility];  Service: Gastroenterology;  Laterality: N/A;   POLYPECTOMY  03/07/2019   Procedure: POLYPECTOMY;  Surgeon: Carol Ada, MD;  [facility];  Service: Endoscopy;;   Social History:  reports that he quit smoking about 5 years ago. His smoking use included cigarettes. He has a 120.00 pack-year smoking history. He has never used smokeless tobacco. He reports that he does not drink alcohol and does not use drugs.  Allergies  Allergen Reactions   Cardizem [Diltiazem] Shortness Of Breath   Pacerone [Amiodarone] Shortness Of Breath, Nausea Only and Other (See Comments)    Abdominal pain Dizziness   Tambocor [Flecainide] Shortness Of Breath    Family History  Problem Relation Age of Onset   Heart disease Brother    COPD Brother    Diabetes Mother     Prior to Admission medications   Medication Sig Start Date End Date Taking? Authorizing Provider  acetaminophen (TYLENOL)  650 MG CR tablet Take 1,300 mg by mouth 2 (two) times daily as needed for pain.   Yes [provider]  albuterol (PROVENTIL) (2.5 MG/3ML) 0.083% nebulizer solution Take 3 mLs (2.5 mg total) by nebulization every 4 (four) hours as needed for wheezing or shortness of breath. 04/30/22  Yes Kathie Dike, MD  albuterol (VENTOLIN HFA) 108 (90 Base) MCG/ACT inhaler Inhale 2 puffs into the lungs every 6 (six) hours as needed for wheezing or shortness of breath. 08/18/21  Yes Margaretha Seeds, MD  cetirizine (ZYRTEC) 10  MG tablet Take 10 mg by mouth daily.   Yes [provider]  famotidine (PEPCID) 20 MG tablet Take 20 mg by mouth at bedtime.    Yes [provider]  Ferrous Sulfate (IRON) 325 (65 Fe) MG TABS Take 325 mg by mouth daily.   Yes [provider]  fluticasone-salmeterol (ADVAIR DISKUS) 250-50 MCG/ACT AEPB Inhale 1 puff into the lungs in the morning and at bedtime. 02/14/22  Yes Margaretha Seeds, MD  furosemide (LASIX) 40 MG tablet Take 1.5 tablets (60 mg total) by mouth 2 (two) times daily. 03/14/22  Yes Camnitz, Will Hassell Done, MD  irbesartan (AVAPRO) 300 MG tablet TAKE 1 TABLET(300 MG) BY MOUTH DAILY Patient taking differently: Take 300 mg by mouth every evening. 06/02/22  Yes Camnitz, Will Hassell Done, MD  latanoprost (XALATAN) 0.005 % ophthalmic solution Place 1 drop into both eyes at bedtime.   Yes [provider]  pantoprazole (PROTONIX) 40 MG tablet Take 1 tablet (40 mg total) by mouth daily. 04/30/22 04/30/23 Yes Kathie Dike, MD  Polyethyl Glycol-Propyl Glycol (SYSTANE OP) Place 1 drop into both eyes daily as needed (dry eyes).   Yes [provider]  rosuvastatin (CRESTOR) 20 MG tablet Take 20 mg by mouth daily. 12/12/18  Yes [provider]  spironolactone (ALDACTONE) 25 MG tablet Take 1 tablet (25 mg total) by mouth daily. 07/23/18  Yes Hilty, Nadean Corwin, MD  XARELTO 20 MG TABS tablet TAKE 1 TABLET BY MOUTH EVERY DAY WITH DINNER Patient taking differently: Take 20 mg by mouth daily with supper. 02/14/22  Yes Camnitz, Will Hassell Done, MD  HYDROcodone bit-homatropine (HYCODAN) 5-1.5 MG/5ML syrup Take 5 mLs by mouth every 6 (six) hours as needed for cough. Patient not taking: Reported on 07/06/2022 05/09/22   Margaretha Seeds, MD    Physical Exam: Vitals:   07/06/22 1308 07/06/22 1325 07/06/22 1349 07/06/22 1530  BP: 96/67 103/88  113/63  Pulse: 89 89 89   Resp: 17 19 (!) 21 (!) 21  Temp: 98.5 F (36.9 C) 99.1 F (37.3 C)  98.9 F (37.2 C)   TempSrc: Axillary Axillary  Axillary  SpO2: 100% 100% 100% 100%  Weight:      Height:       General:  Appears calm and comfortable on BIPAP, significant tachypnea with minimal exertion Eyes:  EOMI, normal lids, iris ENT:  grossly normal hearing, lips; BIPAP in place Neck:  no LAD, masses or thyromegaly Cardiovascular:  RRR, no m/r/g. No LE edema.  Respiratory:   CTA bilaterally with no wheezes/rales/rhonchi.  Normal respiratory effort. Abdomen:  soft, NT, ND Skin:  no rash or induration seen on limited exam Musculoskeletal:  grossly normal tone BUE/BLE, good ROM, no bony abnormality Psychiatric:  blunted mood and affect, speech limited by BIPAP Neurologic:  CN 2-12 grossly intact, moves all extremities in coordinated fashion - limited by BIPAP   Radiological Exams on Admission: Independently reviewed - see discussion  in A/P where applicable  DG Chest Port 1 View  Result Date: 07/06/2022 CLINICAL DATA:  Shortness of breath EXAM: PORTABLE CHEST 1 VIEW COMPARISON:  04/29/2022 FINDINGS: Lungs are clear.  No pleural effusion or pneumothorax. The heart is normal in size. IMPRESSION: No evidence of acute cardiopulmonary disease. Electronically Signed   By: Julian Hy M.D.   On: 07/06/2022 08:39    EKG: Independently reviewed.  NSR with rate 88; RBBB and LAFB; nonspecific ST changes with no evidence of acute ischemia   Labs on Admission: I have personally reviewed the available labs and imaging studies at the time of the admission.  Pertinent labs:    Glucose 139 BUN 36; 10 on 05/05/22 Albumin 3.1 BNP 266.1 HS troponin 796 WBC 13.6 Hgb 5.9; 7.8 on 05/05/22 Heme positive   Assessment and Plan: Principal Problem:   Acute upper GI bleeding Active Problems:   Chronic obstructive pulmonary disease (HCC)   Morbid obesity with BMI of 40.0-44.9, adult (HCC)   Essential hypertension   Persistent atrial fibrillation (HCC)   OSA on CPAP   Acute blood loss anemia   Glaucoma  (increased eye pressure)    Upper GI Bleed -Patient is presenting with melena, suggestive of upper GI bleeding; he has h/o the same from gastric AVMs. -The patient is not tachycardic with mildly depressed blood pressure, suggesting subacute volume loss.  -Will admit to progressive bed  -GI consulted by ED, will follow up recommendations -NPO for possible EGD -LR at 100 mL/hr -Start IV pantoprazole 40 BID for patients with ongoing bleeding who need urgent EGD -Zofran IV for nausea -Avoid NSAIDs and SQ heparin -Maintain IV access (2 large bore IVs if possible).  ABLA -Patient's SOB is most likely caused by anemia secondary to upper GI bleeding.  -His Hgb decreased from 7.8 on 12/15 to 5.9 today.  -Type and screen were done in ED.  -Two units of blood were ordered by ED.  -Monitor closely and follow cbc q12h, transfuse as necessary for Hbg <7  Respiratory distress -As noted above, patient with SOB that was thought to be related to ABLA -He was started on BIPAP ad was stable at rest but marked tachypnea and cough with minimal movement -Wean off BIPAP as tolerated  Elevated troponin -Markedly increased troponin -No report of CP -Likely demand ischemia in the setting of ABLA and respiratory distress -Repeat troponin  Afib -Hold Xarelto  COPD -Continue Advair (Dulera per formulary), Albuterol  OSA -Hold CPAP for now given that he is on BIPAP  HTN -Hold irbesartan, spironolactone for now given marginal BP in the setting of GI bleeding  HLD -Continue rosuvastatin  Glaucoma -Continue latanoprost  Obesity -Body mass index is 40.03 kg/m..  -Weight loss should be encouraged -Outpatient PCP/bariatric medicine f/u encouraged    Advance Care Planning:   Code Status: Full Code -Code status was discussed with the patient and wife at the time of admission.  The patient would want to receive full resuscitative measures at this time but he was pretty uncertain about this decision  and was encouraged to this decision by his wife; further discussion is encouraged.   Consults: GI  DVT Prophylaxis: SCDs  Family Communication: Wife was present throughout evaluation  Severity of Illness: The appropriate patient status for this patient is INPATIENT. Inpatient status is judged to be reasonable and necessary in order to provide the required intensity of service to ensure the patient's safety. The patient's presenting symptoms, physical exam findings, and initial radiographic  and laboratory data in the context of their chronic comorbidities is felt to place them at high risk for further clinical deterioration. Furthermore, it is not anticipated that the patient will be medically stable for discharge from the hospital within 2 midnights of admission.   * I certify that at the point of admission it is my clinical judgment that the patient will require inpatient hospital care spanning beyond 2 midnights from the point of admission due to high intensity of service, high risk for further deterioration and high frequency of surveillance required.*  Author: Karmen Bongo, MD 07/06/2022 4:54 PM  For on call review [email or web address].

## 2022-07-06 NOTE — ED Notes (Signed)
This RN unable to pull blood from placed PIV, phlebotomy recruited and at bedside.

## 2022-07-06 NOTE — Progress Notes (Signed)
Patient refused the use of CPAP for the evening. Will start with home cpap tomorrow after his wife brings it in.

## 2022-07-06 NOTE — ED Notes (Signed)
Tech picking up blood.

## 2022-07-06 NOTE — ED Provider Notes (Addendum)
[facility] Provider Note   CSN: [place]:[id number] Arrival date & time: 07/06/22  [id number]     History  Chief Complaint  Patient presents with   Shortness of Breath    Derrick Alexander is a 77 y.o. male.  Patient with hx afib, chf/diastolic dysfunction, cpap, copd, gi bleed due to gastric avm, presents indicating increased sob in past couple days. Symptoms acute onset, moderate, persistent. Indicates sometimes he has to use his cpap during day in addition to at night, and has been using that more in past day. Indicates compliant w home meds, denies recent change. No new/worsening leg swelling or pain. Chest has felt tight in past couple days. Occasional non prod cough. No sore throat, congestion or fever/chills. No known ill contacts. No pnd. Stools have been darker than normal lately/black - takes iron.   The history is provided by the patient, medical records and the EMS personnel. The history is limited by the condition of the patient.  Shortness of Breath Associated symptoms: no abdominal pain, no chest pain, no fever, no headaches, no neck pain, no rash, no sore throat and no vomiting        Home Medications Prior to Admission medications   Medication Sig Start Date End Date Taking? Authorizing Provider  albuterol (PROVENTIL) (2.5 MG/3ML) 0.083% nebulizer solution Take 3 mLs (2.5 mg total) by nebulization every 4 (four) hours as needed for wheezing or shortness of breath. 04/30/22   Kathie Dike, MD  albuterol (VENTOLIN HFA) 108 (90 Base) MCG/ACT inhaler Inhale 2 puffs into the lungs every 6 (six) hours as needed for wheezing or shortness of breath. 08/18/21   Margaretha Seeds, MD  cetirizine (ZYRTEC) 10 MG tablet Take 10 mg by mouth daily.    [provider]  famotidine (PEPCID) 20 MG tablet Take 20 mg by mouth at bedtime.     [provider]  fluticasone-salmeterol (ADVAIR DISKUS) 250-50 MCG/ACT AEPB Inhale 1 puff into the  lungs in the morning and at bedtime. 02/14/22   Margaretha Seeds, MD  furosemide (LASIX) 40 MG tablet Take 1.5 tablets (60 mg total) by mouth 2 (two) times daily. 03/14/22   Camnitz, Will Hassell Done, MD  guaiFENesin (MUCINEX) 600 MG 12 hr tablet Take by mouth 2 (two) times daily as needed.    [provider]  HYDROcodone bit-homatropine (HYCODAN) 5-1.5 MG/5ML syrup Take 5 mLs by mouth every 6 (six) hours as needed for cough. 05/09/22   Margaretha Seeds, MD  irbesartan (AVAPRO) 300 MG tablet TAKE 1 TABLET(300 MG) BY MOUTH DAILY 06/02/22   Camnitz, Ocie Doyne, MD  latanoprost (XALATAN) 0.005 % ophthalmic solution Place 1 drop into both eyes at bedtime.    [provider]  pantoprazole (PROTONIX) 40 MG tablet Take 1 tablet (40 mg total) by mouth daily. 04/30/22 04/30/23  Kathie Dike, MD  rosuvastatin (CRESTOR) 20 MG tablet Take 20 mg by mouth daily. 12/12/18   [provider]  sodium chloride (OCEAN) 0.65 % SOLN nasal spray Place 1 spray into both nostrils as needed for congestion.    [provider]  spironolactone (ALDACTONE) 25 MG tablet Take 1 tablet (25 mg total) by mouth daily. 07/23/18   Hilty, Nadean Corwin, MD  XARELTO 20 MG TABS tablet TAKE 1 TABLET BY MOUTH EVERY DAY WITH DINNER 02/14/22   Camnitz, Ocie Doyne, MD      Allergies    Amiodarone, Diltiazem, and Flecainide    Review of Systems  Review of Systems  Constitutional:  Negative for fever.  HENT:  Negative for sore throat.   Eyes:  Negative for redness.  Respiratory:  Positive for shortness of breath.   Cardiovascular:  Negative for chest pain and palpitations.  Gastrointestinal:  Negative for abdominal pain, diarrhea and vomiting.  Genitourinary:  Negative for dysuria and flank pain.  Musculoskeletal:  Negative for back pain and neck pain.  Skin:  Negative for rash.  Neurological:  Negative for headaches.  Hematological:  Does not bruise/bleed easily.  Psychiatric/Behavioral:  Negative for  confusion.     Physical Exam Updated Vital Signs BP 96/68   Pulse 86   Temp 97.7 F (36.5 C) (Axillary)   Resp 20   Ht 1.676 m (5' 6"$ )   Wt 112.5 kg   SpO2 100%   BMI 40.03 kg/m  Physical Exam Vitals and nursing note reviewed.  Constitutional:      Appearance: Normal appearance. He is well-developed.  HENT:     Head: Atraumatic.     Nose: Nose normal.     Mouth/Throat:     Mouth: Mucous membranes are moist.     Pharynx: Oropharynx is clear.  Eyes:     General: No scleral icterus.    Conjunctiva/sclera: Conjunctivae normal.  Neck:     Trachea: No tracheal deviation.  Cardiovascular:     Rate and Rhythm: Normal rate and regular rhythm.     Pulses: Normal pulses.     Heart sounds: Murmur heard.     No friction rub. No gallop.  Pulmonary:     Effort: Pulmonary effort is normal. No accessory muscle usage or respiratory distress.     Breath sounds: Wheezing present.     Comments: Mild wheezing bil.  Abdominal:     General: Bowel sounds are normal. There is no distension.     Palpations: Abdomen is soft.     Tenderness: There is no abdominal tenderness.  Genitourinary:    Comments: No cva tenderness. V dark brown stool - sent for hemoccult.  Musculoskeletal:     Cervical back: Normal range of motion and neck supple. No rigidity.     Comments: Mild bilateral ankle edema.   Skin:    General: Skin is warm and dry.     Findings: No rash.  Neurological:     Mental Status: He is alert.     Comments: Alert, speech clear.   Psychiatric:        Mood and Affect: Mood normal.     ED Results / Procedures / Treatments   Labs (all labs ordered are listed, but only abnormal results are displayed) Results for orders placed or performed during the hospital encounter of 07/06/22  CBC  Result Value Ref Range   WBC 13.6 (H) 4.0 - 10.5 K/uL   RBC 2.05 (L) 4.22 - 5.81 MIL/uL   Hemoglobin 5.9 (LL) 13.0 - 17.0 g/dL   HCT 19.7 (L) 39.0 - 52.0 %   MCV 96.1 80.0 - 100.0 fL   MCH  28.8 26.0 - 34.0 pg   MCHC 29.9 (L) 30.0 - 36.0 g/dL   RDW 19.6 (H) 11.5 - 15.5 %   Platelets 181 150 - 400 K/uL   nRBC 0.7 (H) 0.0 - 0.2 %  Comprehensive metabolic panel  Result Value Ref Range   Sodium 135 135 - 145 mmol/L   Potassium 4.8 3.5 - 5.1 mmol/L   Chloride 104 98 - 111 mmol/L   CO2 19 (L) 22 -  32 mmol/L   Glucose, Bld 139 (H) 70 - 99 mg/dL   BUN 36 (H) 8 - 23 mg/dL   Creatinine, Ser 1.19 0.61 - 1.24 mg/dL   Calcium 8.5 (L) 8.9 - 10.3 mg/dL   Total Protein 5.0 (L) 6.5 - 8.1 g/dL   Albumin 3.1 (L) 3.5 - 5.0 g/dL   AST 38 15 - 41 U/L   ALT 12 0 - 44 U/L   Alkaline Phosphatase 39 38 - 126 U/L   Total Bilirubin 0.6 0.3 - 1.2 mg/dL   GFR, Estimated >60 >60 mL/min   Anion gap 12 5 - 15  POC occult blood, ED Provider will collect  Result Value Ref Range   Fecal Occult Bld POSITIVE (A) NEGATIVE  Type and screen [facility]  Result Value Ref Range   ABO/RH(D) PENDING    Antibody Screen PENDING    Sample Expiration      07/09/2022,2359 Performed at [facility], [place] [address], [place], [place] [address]   Troponin I (High Sensitivity)  Result Value Ref Range   Troponin I (High Sensitivity) 796 (HH) <18 ng/L      EKG EKG Interpretation  Date/Time:  Thursday July 06 2022 08:01:21 EST Ventricular Rate:  88 PR Interval:  192 QRS Duration: 129 QT Interval:  372 QTC Calculation: 451 R Axis:   -61 Text Interpretation: Sinus rhythm Premature ventricular complexes RBBB and LAFB Non-specific ST-t changes Confirmed by Lajean Saver [phone]) on 07/06/2022 8:06:32 AM  Radiology DG Chest Port 1 View  Result Date: 07/06/2022 CLINICAL DATA:  Shortness of breath EXAM: PORTABLE CHEST 1 VIEW COMPARISON:  04/29/2022 FINDINGS: Lungs are clear.  No pleural effusion or pneumothorax. The heart is normal in size. IMPRESSION: No evidence of acute cardiopulmonary disease. Electronically Signed   By: Julian Hy M.D.   On: 07/06/2022 08:39     Procedures Procedures    Medications Ordered in ED Medications  0.9 %  sodium chloride infusion (Manually program via Guardrails IV Fluids) (has no administration in time range)  albuterol (PROVENTIL) (2.5 MG/3ML) 0.083% nebulizer solution 5 mg (5 mg Nebulization Given 07/06/22 0818)  ipratropium (ATROVENT) nebulizer solution 0.5 mg (0.5 mg Nebulization Given 07/06/22 0819)  pantoprazole (PROTONIX) 80 mg /NS 100 mL IVPB (0 mg Intravenous Stopped 07/06/22 0930)    ED Course/ Medical Decision Making/ A&P                             Medical Decision Making Problems Addressed: Acute GI bleeding: acute illness or injury with systemic symptoms that poses a threat to life or bodily functions Precordial chest pain: acute illness or injury with systemic symptoms that poses a threat to life or bodily functions Shortness of breath: acute illness or injury with systemic symptoms that poses a threat to life or bodily functions Symptomatic anemia: acute illness or injury with systemic symptoms that poses a threat to life or bodily functions  Amount and/or Complexity of Data Reviewed Independent Historian: EMS    Details: hx External Data Reviewed: labs and notes. Labs: ordered. Decision-making details documented in ED Course. Radiology: ordered and independent interpretation performed. Decision-making details documented in ED Course. ECG/medicine tests: ordered and independent interpretation performed. Decision-making details documented in ED Course. Discussion of management or test interpretation with external provider(s): Gi/hospitalists  Risk Prescription drug management. Decision regarding hospitalization.   Iv ns. Continuous pulse ox and cardiac monitoring. Labs ordered/sent. Imaging ordered.  Differential diagnosis includes COPD exacerbation, pna, acs, gi bleeding/anemia, etc. Dispo decision including potential need for admission considered - will get labs and imaging and reassess.    Reviewed nursing notes and prior charts for additional history. External reports reviewed. Additional history from: EMS.   Stool is heme pos.   Protonix iv.   Cardiac monitor: sinus rhythm, rate 88.  Labs reviewed/interpreted by me - hgb v low, 5.9.   2 units prbc ordered - will emergently transfuse.   Gi consulted. Hospitalists consulted. Discussed pt with Dr Benson Norway from GI - he will see, keep npo for now, prbc, etc.   Xrays reviewed/interpreted by me - no pna.   CRITICAL CARE RE: acute gi bleeding with severe/symptomatic anemia requiring emergent transfusion.  Performed by: Mirna Mires Total critical care time: 45 minutes Critical care time was exclusive of separately billable procedures and treating other patients. Critical care was necessary to treat or prevent imminent or life-threatening deterioration. Critical care was time spent personally by me on the following activities: development of treatment plan with patient and/or surrogate as well as nursing, discussions with consultants, evaluation of patient's response to treatment, examination of patient, obtaining history from patient or surrogate, ordering and performing treatments and interventions, ordering and review of laboratory studies, ordering and review of radiographic studies, pulse oximetry and re-evaluation of patient's condition.          Final Clinical Impression(s) / ED Diagnoses Final diagnoses:  Acute GI bleeding  Symptomatic anemia  Shortness of breath  Precordial chest pain    Rx / DC Orders ED Discharge Orders     None           Lajean Saver, MD 07/06/22 1126

## 2022-07-06 NOTE — ED Notes (Signed)
ED TO INPATIENT HANDOFF REPORT  ED Nurse Name and Phone #: Duanne Guess [phone]  S Name/Age/Gender Derrick Alexander 77 y.o. male Room/Bed: 021C/021C  Code Status   Code Status: Full Code  Home/SNF/Other Home Patient oriented to: self, place, time, and situation Is this baseline? Yes   Triage Complete: Triage complete  Chief Complaint Acute upper GI bleeding [K92.2]  Triage Note Pt BIB [facility] form home for SOB. Pt reports using CPAP during the day on 2/142024 additionally to the nights. PT desated with EMS on NRB so was placed on CPAP sao2 97%. Pt transfer to bipap upon ED arrival. Pt is A&Ox4, diminished lung sounds. Pt denies any recent respiratory infections.    Allergies Allergies  Allergen Reactions   Cardizem [Diltiazem] Shortness Of Breath   Pacerone [Amiodarone] Shortness Of Breath, Nausea Only and Other (See Comments)    Abdominal pain Dizziness   Tambocor [Flecainide] Shortness Of Breath    Level of Care/Admitting Diagnosis ED Disposition     ED Disposition  Admit   Condition  --   Comment  Hospital Area: [facility] [[id number]]  Level of Care: Progressive [102]  Admit to Progressive based on following criteria: CARDIOVASCULAR & THORACIC of moderate stability with acute coronary syndrome symptoms/low risk myocardial infarction/hypertensive urgency/arrhythmias/heart failure potentially compromising stability and stable post cardiovascular intervention patients.  Admit to Progressive based on following criteria: GI, ENDOCRINE disease patients with GI bleeding, acute liver failure or pancreatitis, stable with diabetic ketoacidosis or thyrotoxicosis (hypothyroid) state.  May admit patient to [name] or [facility] if equivalent level of care is available:: No  Covid Evaluation: Asymptomatic - no recent exposure (last 10 days) testing not required  Diagnosis: Acute upper GI bleeding [id number]  Admitting Physician: Karmen Bongo [[id number]]   Attending Physician: Karmen Bongo [id number]  Certification:: I certify this patient will need inpatient services for at least 2 midnights  Estimated Length of Stay: 4          B Medical/Surgery History Past Medical History:  Diagnosis Date   A-fib ([place])    Abnormal EKG 06/18/2015   COPD (chronic obstructive pulmonary disease) ([place])    COPD with exacerbation ([place])    Faintness 06/18/2015   Hypertension    LBBB (left bundle branch block) 06/18/2015   Obesity    Syncope 08/06/2015   Tobacco use    V-tach ([place]) 08/06/2015   Past Surgical History:  Procedure Laterality Date   ATRIAL FIBRILLATION ABLATION N/A 09/17/2020   Procedure: ATRIAL FIBRILLATION ABLATION;  Surgeon: Constance Haw, MD;  Location: [facility];  Service: Cardiovascular;  Laterality: N/A;   COLONOSCOPY WITH PROPOFOL N/A 03/07/2019   Procedure: COLONOSCOPY WITH PROPOFOL;  Surgeon: Carol Ada, MD;  Location: [facility];  Service: Endoscopy;  Laterality: N/A;   ESOPHAGOGASTRODUODENOSCOPY (EGD) WITH PROPOFOL N/A 04/28/2022   Procedure: ESOPHAGOGASTRODUODENOSCOPY (EGD) WITH PROPOFOL;  Surgeon: Carol Ada, MD;  Location: [facility];  Service: Gastroenterology;  Laterality: N/A;   HOT HEMOSTASIS N/A 04/28/2022   Procedure: HOT HEMOSTASIS (ARGON PLASMA COAGULATION/BICAP);  Surgeon: Carol Ada, MD;  Location: [facility];  Service: Gastroenterology;  Laterality: N/A;   POLYPECTOMY  03/07/2019   Procedure: POLYPECTOMY;  Surgeon: Carol Ada, MD;  Location: [facility];  Service: Endoscopy;;     A IV Location/Drains/Wounds Patient Lines/Drains/Airways Status     Active Line/Drains/Airways     Name Placement date Placement time Site Days   Peripheral IV 07/06/22 20 G Anterior;Left Forearm 07/06/22  0800  Forearm  less than 1   Peripheral IV 07/06/22 22 G Posterior;Left Hand 07/06/22  1100  Hand  less than 1            Intake/Output Last 24 hours  Intake/Output Summary (Last 24 hours) at  07/06/2022 1318 Last data filed at 07/06/2022 1310 Gross per 24 hour  Intake 600 ml  Output --  Net 600 ml    Labs/Imaging Results for orders placed or performed during the hospital encounter of 07/06/22 (from the past 48 hour(s))  Resp panel by RT-PCR (RSV, Flu A&B, Covid) Anterior Nasal Swab     Status: None   Collection Time: 07/06/22  8:08 AM   Specimen: Anterior Nasal Swab  Result Value Ref Range   SARS Coronavirus 2 by RT PCR NEGATIVE NEGATIVE   Influenza A by PCR NEGATIVE NEGATIVE   Influenza B by PCR NEGATIVE NEGATIVE    Comment: (NOTE) The Xpert Xpress SARS-CoV-2/FLU/RSV plus assay is intended as an aid in the diagnosis of influenza from Nasopharyngeal swab specimens and should not be used as a sole basis for treatment. Nasal washings and aspirates are unacceptable for Xpert Xpress SARS-CoV-2/FLU/RSV testing.  Fact Sheet for Patients: EntrepreneurPulse.com.au  Fact Sheet for Healthcare Providers: IncredibleEmployment.be  This test is not yet approved or cleared by the [place] FDA and has been authorized for detection and/or diagnosis of SARS-CoV-2 by FDA under an Emergency Use Authorization (EUA). This EUA will remain in effect (meaning this test can be used) for the duration of the COVID-19 declaration under Section 564(b)(1) of the Act, 21 U.S.C. section 360bbb-3(b)(1), unless the authorization is terminated or revoked.     Resp Syncytial Virus by PCR NEGATIVE NEGATIVE    Comment: (NOTE) Fact Sheet for Patients: EntrepreneurPulse.com.au  Fact Sheet for Healthcare Providers: IncredibleEmployment.be  This test is not yet approved or cleared by the [place] FDA and has been authorized for detection and/or diagnosis of SARS-CoV-2 by FDA under an Emergency Use Authorization (EUA). This EUA will remain in effect (meaning this test can be used) for the duration of the COVID-19  declaration under Section 564(b)(1) of the Act, 21 U.S.C. section 360bbb-3(b)(1), unless the authorization is terminated or revoked.  Performed at [facility], [place] [address], [place], [place] [address]   POC occult blood, ED Provider will collect     Status: Abnormal   Collection Time: 07/06/22  8:16 AM  Result Value Ref Range   Fecal Occult Bld POSITIVE (A) NEGATIVE  CBC     Status: Abnormal   Collection Time: 07/06/22  9:04 AM  Result Value Ref Range   WBC 13.6 (H) 4.0 - 10.5 K/uL   RBC 2.05 (L) 4.22 - 5.81 MIL/uL   Hemoglobin 5.9 (LL) 13.0 - 17.0 g/dL    Comment: REPEATED TO VERIFY THIS CRITICAL RESULT HAS VERIFIED AND BEEN CALLED TO Malai Lady,RN BY DAISY BLU ON 02 15 2024 AT 0959, AND HAS BEEN READ BACK.     HCT 19.7 (L) 39.0 - 52.0 %   MCV 96.1 80.0 - 100.0 fL   MCH 28.8 26.0 - 34.0 pg   MCHC 29.9 (L) 30.0 - 36.0 g/dL   RDW 19.6 (H) 11.5 - 15.5 %   Platelets 181 150 - 400 K/uL   nRBC 0.7 (H) 0.0 - 0.2 %    Comment: Performed at [facility] [address] [place], [place] [address]  Comprehensive metabolic panel     Status: Abnormal   Collection Time: 07/06/22  9:04 AM  Result Value Ref Range   Sodium 135 135 - 145 mmol/L   Potassium 4.8 3.5 - 5.1 mmol/L    Comment: HEMOLYSIS AT THIS LEVEL MAY AFFECT RESULT   Chloride 104 98 - 111 mmol/L   CO2 19 (L) 22 - 32 mmol/L   Glucose, Bld 139 (H) 70 - 99 mg/dL    Comment: Glucose reference range applies only to samples taken after fasting for at least 8 hours.   BUN 36 (H) 8 - 23 mg/dL   Creatinine, Ser 1.19 0.61 - 1.24 mg/dL   Calcium 8.5 (L) 8.9 - 10.3 mg/dL   Total Protein 5.0 (L) 6.5 - 8.1 g/dL   Albumin 3.1 (L) 3.5 - 5.0 g/dL   AST 38 15 - 41 U/L    Comment: HEMOLYSIS AT THIS LEVEL MAY AFFECT RESULT   ALT 12 0 - 44 U/L    Comment: HEMOLYSIS AT THIS LEVEL MAY AFFECT RESULT   Alkaline Phosphatase 39 38 - 126 U/L   Total Bilirubin 0.6 0.3 - 1.2 mg/dL    Comment: HEMOLYSIS AT THIS LEVEL MAY AFFECT  RESULT   GFR, Estimated >60 >60 mL/min    Comment: (NOTE) Calculated using the CKD-EPI Creatinine Equation (2021)    Anion gap 12 5 - 15    Comment: Performed at [facility], [place] [address] [place], [place] [address]  Troponin I (High Sensitivity)     Status: Abnormal   Collection Time: 07/06/22  9:04 AM  Result Value Ref Range   Troponin I (High Sensitivity) 796 (HH) <18 ng/L    Comment: CRITICAL RESULT CALLED TO, READ BACK BY AND VERIFIED WITH C.Franny Selvage,RN 1001 07/06/22 [place],S (NOTE) Elevated high sensitivity troponin I (hsTnI) values and significant  changes across serial measurements may suggest ACS but many other  chronic and acute conditions are known to elevate hsTnI results.  Refer to the "Links" section for chest pain algorithms and additional  guidance. Performed at [facility], [place] [address], [place], [place] [address]   Brain natriuretic peptide     Status: Abnormal   Collection Time: 07/06/22  9:04 AM  Result Value Ref Range   B Natriuretic Peptide 266.1 (H) 0.0 - 100.0 pg/mL    Comment: Performed at [facility] [address] [place], [place] [address]  Type and screen [place]     Status: None (Preliminary result)   Collection Time: 07/06/22  9:04 AM  Result Value Ref Range   ABO/RH(D) O POS    Antibody Screen NEG    Sample Expiration 07/09/2022,2359    Unit Number [id number]    Blood Component Type RED CELLS,LR    Unit division 00    Status of Unit ISSUED    Transfusion Status OK TO TRANSFUSE    Crossmatch Result Compatible    Unit Number [id number]    Blood Component Type RED CELLS,LR    Unit division 00    Status of Unit ISSUED    Transfusion Status OK TO TRANSFUSE    Crossmatch Result      Compatible Performed at [facility], [place] [address] [place], [place] [address]   Prepare RBC (crossmatch)     Status: None   Collection Time: 07/06/22 10:10 AM  Result Value Ref Range   Order Confirmation       ORDER PROCESSED BY BLOOD BANK Performed at [facility], [place] [address], [place], [place] [address]  DG Chest Port 1 View  Result Date: 07/06/2022 CLINICAL DATA:  Shortness of breath EXAM: PORTABLE CHEST 1 VIEW COMPARISON:  04/29/2022 FINDINGS: Lungs are clear.  No pleural effusion or pneumothorax. The heart is normal in size. IMPRESSION: No evidence of acute cardiopulmonary disease. Electronically Signed   By: Julian Hy M.D.   On: 07/06/2022 08:39    Pending Labs Unresulted Labs (From admission, onward)     Start     Ordered   07/07/22 [id number]  Basic metabolic panel  Tomorrow morning,   R        07/06/22 1309   07/06/22 1700  CBC  5A & 5P,   R (with TIMED occurrences)      07/06/22 1309            Vitals/Pain Today's Vitals   07/06/22 1105 07/06/22 1122 07/06/22 1200 07/06/22 1308  BP: (!) 107/56 96/71 (!) 89/57 96/67  Pulse: 87 89 88 89  Resp: 17 16 19 17  $ Temp: 98.5 F (36.9 C) 98.1 F (36.7 C)  98.5 F (36.9 C)  TempSrc: Axillary Axillary  Axillary  SpO2: 100% 100% 100% 100%  Weight:      Height:      PainSc: 0-No pain 0-No pain  0-No pain    Isolation Precautions No active isolations  Medications Medications  rosuvastatin (CRESTOR) tablet 20 mg (has no administration in time range)  albuterol (PROVENTIL) (2.5 MG/3ML) 0.083% nebulizer solution 2.5 mg (has no administration in time range)  mometasone-formoterol (DULERA) 200-5 MCG/ACT inhaler 2 puff (has no administration in time range)  latanoprost (XALATAN) 0.005 % ophthalmic solution 1 drop (has no administration in time range)  polyethylene glycol 0.4% and propylene glycol 0.3% (SYSTANE) ophthalmic gel (has no administration in time range)  lactated ringers infusion (has no administration in time range)  acetaminophen (TYLENOL) tablet 650 mg (has no administration in time range)    Or  acetaminophen (TYLENOL) suppository 650 mg (has no administration in time range)  morphine (PF) 2 MG/ML  injection 2 mg (has no administration in time range)  hydrALAZINE (APRESOLINE) injection 5 mg (has no administration in time range)  sodium chloride flush (NS) 0.9 % injection 3 mL (has no administration in time range)  albuterol (PROVENTIL) (2.5 MG/3ML) 0.083% nebulizer solution 5 mg (5 mg Nebulization Given 07/06/22 0818)  ipratropium (ATROVENT) nebulizer solution 0.5 mg (0.5 mg Nebulization Given 07/06/22 0819)  pantoprazole (PROTONIX) 80 mg /NS 100 mL IVPB (0 mg Intravenous Stopped 07/06/22 0930)  0.9 %  sodium chloride infusion (Manually program via Guardrails IV Fluids) ( Intravenous New Bag/Given 07/06/22 1107)    Mobility walks     Focused Assessments GI   R Recommendations: See Admitting Provider Note  Report given to:   Additional Notes:

## 2022-07-06 NOTE — H&P (View-Only) (Signed)
Reason for Consult: GI bleed and history of bleeding gastric AVM. Referring Physician: [facility]  Ferdinand Cava HPI: This is a 77 year old male with a PMH of bleeding gastric AVMs (EGD with APC on 04/28/2022), CHF, afib, and COPD admitted for worsening SOB.  His symptoms of SOB started rather acutely.  In the ER his HGB was noted to be at 5.9 g/dL, which is a drop from 7.8 g/dL on 05/05/2022.  On a routine basis he uses iron supplementation, but he states that his stools are blacker than usual.  His hemoccult in the ER was positive.  His wife noticed that he was getting progressively weaker and his current presentation was consistent with the prior presentation in December.  Past Medical History:  Diagnosis Date   A-fib [facility])    Abnormal EKG 06/18/2015   COPD (chronic obstructive pulmonary disease) (HCC)    COPD with exacerbation ([place])    Faintness 06/18/2015   Hypertension    LBBB (left bundle branch block) 06/18/2015   Obesity    Syncope 08/06/2015   Tobacco use    V-tach ([place]) 08/06/2015    Past Surgical History:  Procedure Laterality Date   ATRIAL FIBRILLATION ABLATION N/A 09/17/2020   Procedure: ATRIAL FIBRILLATION ABLATION;  Surgeon: Constance Haw, MD;  Location: [place] [facility];  Service: Cardiovascular;  Laterality: N/A;   COLONOSCOPY WITH PROPOFOL N/A 03/07/2019   Procedure: COLONOSCOPY WITH PROPOFOL;  Surgeon: Carol Ada, MD;  Location: [facility];  Service: Endoscopy;  Laterality: N/A;   ESOPHAGOGASTRODUODENOSCOPY (EGD) WITH PROPOFOL N/A 04/28/2022   Procedure: ESOPHAGOGASTRODUODENOSCOPY (EGD) WITH PROPOFOL;  Surgeon: Carol Ada, MD;  Location: [facility];  Service: Gastroenterology;  Laterality: N/A;   HOT HEMOSTASIS N/A 04/28/2022   Procedure: HOT HEMOSTASIS (ARGON PLASMA COAGULATION/BICAP);  Surgeon: Carol Ada, MD;  Location: [facility];  Service: Gastroenterology;  Laterality: N/A;   POLYPECTOMY  03/07/2019   Procedure: POLYPECTOMY;  Surgeon:  Carol Ada, MD;  Location: [facility];  Service: Endoscopy;;    Family History  Problem Relation Age of Onset   Heart disease Brother    COPD Brother    Diabetes Mother     Social History:  reports that he quit smoking about 5 years ago. His smoking use included cigarettes. He has a 120.00 pack-year smoking history. He has never used smokeless tobacco. He reports that he does not drink alcohol and does not use drugs.  Allergies:  Allergies  Allergen Reactions   Cardizem [Diltiazem] Shortness Of Breath   Pacerone [Amiodarone] Shortness Of Breath, Nausea Only and Other (See Comments)    Abdominal pain Dizziness   Tambocor [Flecainide] Shortness Of Breath    Medications: Scheduled:  latanoprost  1 drop Both Eyes QHS   mometasone-formoterol  2 puff Inhalation BID   [START ON 07/07/2022] rosuvastatin  20 mg Oral Daily   sodium chloride flush  3 mL Intravenous Q12H   Continuous:  lactated ringers 100 mL/hr at 07/06/22 1350    Results for orders placed or performed during the hospital encounter of 07/06/22 (from the past 24 hour(s))  Resp panel by RT-PCR (RSV, Flu A&B, Covid) Anterior Nasal Swab     Status: None   Collection Time: 07/06/22  8:08 AM   Specimen: Anterior Nasal Swab  Result Value Ref Range   SARS Coronavirus 2 by RT PCR NEGATIVE NEGATIVE   Influenza A by PCR NEGATIVE NEGATIVE   Influenza B by PCR NEGATIVE NEGATIVE   Resp Syncytial Virus by PCR NEGATIVE NEGATIVE  POC occult blood, ED Provider will collect     Status: Abnormal   Collection Time: 07/06/22  8:16 AM  Result Value Ref Range   Fecal Occult Bld POSITIVE (A) NEGATIVE  CBC     Status: Abnormal   Collection Time: 07/06/22  9:04 AM  Result Value Ref Range   WBC 13.6 (H) 4.0 - 10.5 K/uL   RBC 2.05 (L) 4.22 - 5.81 MIL/uL   Hemoglobin 5.9 (LL) 13.0 - 17.0 g/dL   HCT 19.7 (L) 39.0 - 52.0 %   MCV 96.1 80.0 - 100.0 fL   MCH 28.8 26.0 - 34.0 pg   MCHC 29.9 (L) 30.0 - 36.0 g/dL   RDW 19.6 (H) 11.5 - 15.5  %   Platelets 181 150 - 400 K/uL   nRBC 0.7 (H) 0.0 - 0.2 %  Comprehensive metabolic panel     Status: Abnormal   Collection Time: 07/06/22  9:04 AM  Result Value Ref Range   Sodium 135 135 - 145 mmol/L   Potassium 4.8 3.5 - 5.1 mmol/L   Chloride 104 98 - 111 mmol/L   CO2 19 (L) 22 - 32 mmol/L   Glucose, Bld 139 (H) 70 - 99 mg/dL   BUN 36 (H) 8 - 23 mg/dL   Creatinine, Ser 1.19 0.61 - 1.24 mg/dL   Calcium 8.5 (L) 8.9 - 10.3 mg/dL   Total Protein 5.0 (L) 6.5 - 8.1 g/dL   Albumin 3.1 (L) 3.5 - 5.0 g/dL   AST 38 15 - 41 U/L   ALT 12 0 - 44 U/L   Alkaline Phosphatase 39 38 - 126 U/L   Total Bilirubin 0.6 0.3 - 1.2 mg/dL   GFR, Estimated >60 >60 mL/min   Anion gap 12 5 - 15  Troponin I (High Sensitivity)     Status: Abnormal   Collection Time: 07/06/22  9:04 AM  Result Value Ref Range   Troponin I (High Sensitivity) 796 (HH) <18 ng/L  Brain natriuretic peptide     Status: Abnormal   Collection Time: 07/06/22  9:04 AM  Result Value Ref Range   B Natriuretic Peptide 266.1 (H) 0.0 - 100.0 pg/mL  Type and screen [place]     Status: None (Preliminary result)   Collection Time: 07/06/22  9:04 AM  Result Value Ref Range   ABO/RH(D) O POS    Antibody Screen NEG    Sample Expiration 07/09/2022,2359    Unit Number [id number]    Blood Component Type RED CELLS,LR    Unit division 00    Status of Unit ISSUED    Transfusion Status OK TO TRANSFUSE    Crossmatch Result Compatible    Unit Number [id number]    Blood Component Type RED CELLS,LR    Unit division 00    Status of Unit ISSUED    Transfusion Status OK TO TRANSFUSE    Crossmatch Result      Compatible Performed at [facility], [address], [place], [place] [address]   Prepare RBC (crossmatch)     Status: None   Collection Time: 07/06/22 10:10 AM  Result Value Ref Range   Order Confirmation      ORDER PROCESSED BY BLOOD BANK Performed at [facility], [place] [address],  [place], [place] [address]      DG Chest Port 1 View  Result Date: 07/06/2022 CLINICAL DATA:  Shortness of breath EXAM: PORTABLE CHEST 1 VIEW COMPARISON:  04/29/2022 FINDINGS: Lungs are  clear.  No pleural effusion or pneumothorax. The heart is normal in size. IMPRESSION: No evidence of acute cardiopulmonary disease. Electronically Signed   By: Julian Hy M.D.   On: 07/06/2022 08:39    ROS:  As stated above in the HPI otherwise negative.  Blood pressure 103/88, pulse 89, temperature 99.1 F (37.3 C), temperature source Axillary, resp. rate (!) 21, height 5' 6"$  (1.676 m), weight 112.5 kg, SpO2 100 %.    PE: Gen: NAD, Alert and Oriented HEENT:  [place]/AT, EOMI Neck: Supple, no LAD Lungs: CTA Bilaterally CV: RRR without M/G/R ABD: Soft, NTND, +BS Ext: No C/C/E  Assessment/Plan: 1) Anemia. 2) Heme positive stool. 3) SOB.    It is likely that he is bleeding from gastric AVMs again.  An EGD will be performed with APC.  Plan: 1) EGD with APC tomorrow. 2) Agree with blood transfusions.  Velora Horstman D 07/06/2022, 3:08 PM

## 2022-07-07 ENCOUNTER — Inpatient Hospital Stay (HOSPITAL_COMMUNITY): Payer: Medicare Other | Admitting: Certified Registered Nurse Anesthetist

## 2022-07-07 ENCOUNTER — Encounter (HOSPITAL_COMMUNITY): Payer: Self-pay | Admitting: Internal Medicine

## 2022-07-07 ENCOUNTER — Encounter (HOSPITAL_COMMUNITY)
Admission: EM | Disposition: A | Discharge status: Home Health | Payer: Self-pay | Source: Home / Self Care | Attending: Student

## 2022-07-07 DIAGNOSIS — H409 Unspecified glaucoma: Secondary | ICD-10-CM

## 2022-07-07 DIAGNOSIS — K921 Melena: Secondary | ICD-10-CM

## 2022-07-07 DIAGNOSIS — I509 Heart failure, unspecified: Secondary | ICD-10-CM

## 2022-07-07 DIAGNOSIS — D500 Iron deficiency anemia secondary to blood loss (chronic): Secondary | ICD-10-CM

## 2022-07-07 DIAGNOSIS — R7989 Other specified abnormal findings of blood chemistry: Secondary | ICD-10-CM | POA: Insufficient documentation

## 2022-07-07 DIAGNOSIS — I5032 Chronic diastolic (congestive) heart failure: Secondary | ICD-10-CM | POA: Insufficient documentation

## 2022-07-07 DIAGNOSIS — D649 Anemia, unspecified: Secondary | ICD-10-CM | POA: Insufficient documentation

## 2022-07-07 DIAGNOSIS — R9431 Abnormal electrocardiogram [ECG] [EKG]: Secondary | ICD-10-CM | POA: Insufficient documentation

## 2022-07-07 DIAGNOSIS — D62 Acute posthemorrhagic anemia: Secondary | ICD-10-CM | POA: Insufficient documentation

## 2022-07-07 DIAGNOSIS — I10 Essential (primary) hypertension: Secondary | ICD-10-CM

## 2022-07-07 DIAGNOSIS — G4733 Obstructive sleep apnea (adult) (pediatric): Secondary | ICD-10-CM

## 2022-07-07 DIAGNOSIS — J449 Chronic obstructive pulmonary disease, unspecified: Secondary | ICD-10-CM

## 2022-07-07 DIAGNOSIS — K3189 Other diseases of stomach and duodenum: Secondary | ICD-10-CM

## 2022-07-07 DIAGNOSIS — Z6841 Body Mass Index (BMI) 40.0 and over, adult: Secondary | ICD-10-CM

## 2022-07-07 DIAGNOSIS — I5033 Acute on chronic diastolic (congestive) heart failure: Secondary | ICD-10-CM | POA: Insufficient documentation

## 2022-07-07 DIAGNOSIS — I110 Hypertensive heart disease with heart failure: Secondary | ICD-10-CM

## 2022-07-07 DIAGNOSIS — I480 Paroxysmal atrial fibrillation: Secondary | ICD-10-CM

## 2022-07-07 DIAGNOSIS — Z87891 Personal history of nicotine dependence: Secondary | ICD-10-CM

## 2022-07-07 HISTORY — PX: HOT HEMOSTASIS: SHX5433

## 2022-07-07 HISTORY — PX: ESOPHAGOGASTRODUODENOSCOPY (EGD) WITH PROPOFOL: SHX5813

## 2022-07-07 LAB — CBC
HCT: 24.5 % — ABNORMAL LOW (ref 39.0–52.0)
Hemoglobin: 8.2 g/dL — ABNORMAL LOW (ref 13.0–17.0)
MCH: 30.5 pg (ref 26.0–34.0)
MCHC: 33.5 g/dL (ref 30.0–36.0)
MCV: 91.1 fL (ref 80.0–100.0)
Platelets: 152 10*3/uL (ref 150–400)
RBC: 2.69 MIL/uL — ABNORMAL LOW (ref 4.22–5.81)
RDW: 17.1 % — ABNORMAL HIGH (ref 11.5–15.5)
WBC: 15.5 10*3/uL — ABNORMAL HIGH (ref 4.0–10.5)
nRBC: 0.3 % — ABNORMAL HIGH (ref 0.0–0.2)

## 2022-07-07 LAB — BASIC METABOLIC PANEL
Anion gap: 7 (ref 5–15)
BUN: 28 mg/dL — ABNORMAL HIGH (ref 8–23)
CO2: 22 mmol/L (ref 22–32)
Calcium: 8.5 mg/dL — ABNORMAL LOW (ref 8.9–10.3)
Chloride: 106 mmol/L (ref 98–111)
Creatinine, Ser: 0.85 mg/dL (ref 0.61–1.24)
GFR, Estimated: >60 mL/min (ref >60)
Glucose, Bld: 142 mg/dL — ABNORMAL HIGH (ref 70–99)
Potassium: 4.1 mmol/L (ref 3.5–5.1)
Sodium: 135 mmol/L (ref 135–145)

## 2022-07-07 LAB — BRAIN NATRIURETIC PEPTIDE: B Natriuretic Peptide: 522 pg/mL — ABNORMAL HIGH (ref 0.0–100.0)

## 2022-07-07 LAB — TROPONIN I (HIGH SENSITIVITY)
Troponin I (High Sensitivity): 3438 ng/L (ref <18)
Troponin I (High Sensitivity): 4113 ng/L (ref <18)

## 2022-07-07 LAB — PREPARE RBC (CROSSMATCH)

## 2022-07-07 LAB — HEMOGLOBIN AND HEMATOCRIT, BLOOD
HCT: 21.1 % — ABNORMAL LOW (ref 39.0–52.0)
Hemoglobin: 6.7 g/dL — CL (ref 13.0–17.0)

## 2022-07-07 SURGERY — ESOPHAGOGASTRODUODENOSCOPY (EGD) WITH PROPOFOL
Anesthesia: Monitor Anesthesia Care

## 2022-07-07 MED ORDER — LACTATED RINGERS IV SOLN: INTRAVENOUS | Status: DC | PRN

## 2022-07-07 MED ORDER — METOPROLOL TARTRATE 12.5 MG HALF TABLET
12.5000 mg | ORAL_TABLET | Freq: Two times a day (BID) | ORAL | Status: DC
  Administered 2022-07-07 – 2022-07-12 (×10): 12.5 mg via ORAL
  Filled 2022-07-07 (×10): qty 1

## 2022-07-07 MED ORDER — PROPOFOL 10 MG/ML IV BOLUS
INTRAVENOUS | Status: DC | PRN
  Administered 2022-07-07: 40 mg via INTRAVENOUS
  Administered 2022-07-07: 30 mg via INTRAVENOUS
  Administered 2022-07-07: 50 mg via INTRAVENOUS

## 2022-07-07 MED ORDER — FUROSEMIDE 10 MG/ML IJ SOLN
40.0000 mg | INTRAMUSCULAR | Status: AC
  Administered 2022-07-07: 40 mg via INTRAVENOUS
  Filled 2022-07-07: qty 4

## 2022-07-07 MED ORDER — SODIUM CHLORIDE 0.9% IV SOLUTION: Freq: Once | INTRAVENOUS | Status: AC

## 2022-07-07 SURGICAL SUPPLY — 15 items

## 2022-07-07 NOTE — Interval H&P Note (Signed)
History and Physical Interval Note:  07/07/2022 12:36 PM  Derrick Alexander  has presented today for surgery, with the diagnosis of GI bleed.  The various methods of treatment have been discussed with the patient and family. After consideration of risks, benefits and other options for treatment, the patient has consented to  Procedure(s): ESOPHAGOGASTRODUODENOSCOPY (EGD) WITH PROPOFOL (N/A) as a surgical intervention.  The patient's history has been reviewed, patient examined, no change in status, stable for surgery.  I have reviewed the patient's chart and labs.  Questions were answered to the patient's satisfaction.     Gaetano Romberger D

## 2022-07-07 NOTE — Op Note (Signed)
[facility] Patient Name: Derrick Alexander Procedure Date : 07/07/2022 MRN: [id number] Attending MD: Carol Ada , MD, [id number] Date of Birth: 05/31/1945 CSN: [id number] Age: 77 Admit Type: Inpatient Procedure:                Upper GI endoscopy Indications:              Heme positive stool, Melena Providers:                Carol Ada, MD, Jeanella Cara, RN, Janie                            [place], Technician, Wyatt Haste Muqtasid, CRNA Referring MD:              Medicines:                Propofol per Anesthesia Complications:            No immediate complications. Estimated Blood Loss:     Estimated blood loss: none. Procedure:                Pre-Anesthesia Assessment:                           - Prior to the procedure, a History and Physical                            was performed, and patient medications and                            allergies were reviewed. The patient's tolerance of                            previous anesthesia was also reviewed. The risks                            and benefits of the procedure and the sedation                            options and risks were discussed with the patient.                            All questions were answered, and informed consent                            was obtained. Prior Anticoagulants: The patient has                            taken no anticoagulant or antiplatelet agents. ASA                            Grade Assessment: III - A patient with severe                            systemic disease. After reviewing the risks and  benefits, the patient was deemed in satisfactory                            condition to undergo the procedure.                           - Sedation was administered by an anesthesia                            professional. Deep sedation was attained.                           After obtaining informed consent, the endoscope was                             passed under direct vision. Throughout the                            procedure, the patient's blood pressure, pulse, and                            oxygen saturations were monitored continuously. The                            [id number]) Olympus endoscope was introduced                            through the mouth, and advanced to the second part                            of duodenum. The upper GI endoscopy was                            accomplished without difficulty. The patient                            tolerated the procedure well. Scope In: Scope Out: Findings:      The esophagus was normal.      Patchy mildly erythematous mucosa without bleeding was found in the       gastric body. Coagulation for tissue destruction using monopolar probe       was successful.      The examined duodenum was normal.      In the gastric lumen there were three nonbleeding erythematous patches.       It was not able to be discerned if the findings were representative of       AVMs, but they were all ablated. Impression:               - Normal esophagus.                           - Erythematous mucosa in the gastric body. Treated                            with a monopolar probe.                           -  Normal examined duodenum.                           - No specimens collected. Recommendation:           - Return patient to hospital ward for ongoing care.                           - Resume regular diet.                           - Continue present medications.                           - Restart Eliquis.                           - Monitor HGB and for any signs of melena.                           - If bleeding recurs, then the patient will need a                            colonoscopy. Procedure Code(s):        --- Professional ---                           [phone], Esophagogastroduodenoscopy, flexible,                            transoral; with ablation of tumor(s), polyp(s),  or                            other lesion(s) (includes pre- and post-dilation                            and guide wire passage, when performed) Diagnosis Code(s):        --- Professional ---                           K31.89, Other diseases of stomach and duodenum                           R19.5, Other fecal abnormalities                           K92.1, Melena (includes Hematochezia) CPT copyright 2022 [employer]. All rights reserved. The codes documented in this report are preliminary and upon coder review may  be revised to meet current compliance requirements. Carol Ada, MD Carol Ada, MD 07/07/2022 1:52:04 PM This report has been signed electronically. Number of Addenda: 0

## 2022-07-07 NOTE — Anesthesia Postprocedure Evaluation (Signed)
Anesthesia Post Note  Patient: Derrick Alexander  Procedure(s) Performed: ESOPHAGOGASTRODUODENOSCOPY (EGD) WITH PROPOFOL HOT HEMOSTASIS (ARGON PLASMA COAGULATION/BICAP)     Patient location during evaluation: Endoscopy Anesthesia Type: MAC Level of consciousness: awake and alert Pain management: pain level controlled Vital Signs Assessment: post-procedure vital signs reviewed and stable Respiratory status: spontaneous breathing, nonlabored ventilation, respiratory function stable and patient connected to nasal cannula oxygen Cardiovascular status: blood pressure returned to baseline and stable Postop Assessment: no apparent nausea or vomiting Anesthetic complications: no  No notable events documented.  Last Vitals:  Vitals:   07/07/22 1355 07/07/22 1357  BP:    Pulse:  69  Resp:  (!) 23  Temp:    SpO2: 98% 97%    Last Pain:  Vitals:   07/07/22 1343  TempSrc:   PainSc: 0-No pain                 Jerre Vandrunen L Rolf Fells

## 2022-07-07 NOTE — Anesthesia Preprocedure Evaluation (Addendum)
Anesthesia Evaluation  Patient identified by MRN, date of birth, ID band Patient awake    Reviewed: Allergy & Precautions, NPO status , Patient's Chart, lab work & pertinent test results  Airway Mallampati: III  TM Distance: >3 FB Neck ROM: Full    Dental  (+) Edentulous Upper, Edentulous Lower, Dental Advisory Given   Pulmonary sleep apnea and Continuous Positive Airway Pressure Ventilation , COPD, former smoker   Pulmonary exam normal breath sounds clear to auscultation       Cardiovascular hypertension, +CHF  Normal cardiovascular exam+ dysrhythmias (xarelto) Atrial Fibrillation and Ventricular Tachycardia + Valvular Problems/Murmurs (mod AS) AS  Rhythm:Regular Rate:Normal  EKG LBBB  TTE 2023  1. Left ventricular ejection fraction, by estimation, is 60 to 65%. The  left ventricle has normal function. The left ventricle has no regional  wall motion abnormalities. There is moderate left ventricular hypertrophy.  Left ventricular diastolic  parameters are consistent with Grade II diastolic dysfunction  (pseudonormalization). Elevated left ventricular end-diastolic pressure.  The E/e' is 75.   2. Right ventricular systolic function is normal. The right ventricular  size is normal. There is mildly elevated pulmonary artery systolic  pressure. The estimated right ventricular systolic pressure is [id number] mmHg.   3. The mitral valve is abnormal. Trivial mitral valve regurgitation.   4. The aortic valve is calcified. Aortic valve regurgitation is not  visualized. Moderate aortic valve stenosis. Aortic valve area, by VTI  measures 1.06 cm. Aortic valve mean gradient measures 26.4 mmHg. Aortic  valve Vmax measures 3.62 m/s.   5. The inferior vena cava is normal in size with <50% respiratory  variability, suggesting right atrial pressure of 8 mmHg.     Neuro/Psych negative neurological ROS  negative psych ROS   GI/Hepatic negative GI  ROS, Neg liver ROS,,,  Endo/Other    Morbid obesity (BMI 40)  Renal/GU negative Renal ROS  negative genitourinary   Musculoskeletal negative musculoskeletal ROS (+)    Abdominal   Peds  Hematology  (+) Blood dyscrasia, anemia Lab Results      Component                Value               Date                      WBC                      15.5 (H)            07/07/2022                HGB                      8.2 (L)             07/07/2022                HCT                      24.5 (L)            07/07/2022                MCV                      91.1  07/07/2022                PLT                      152                 07/07/2022              Anesthesia Other Findings PMH of bleeding gastric AVMs (EGD with APC on 04/28/2022), CHF, afib, and COPD admitted for worsening SOB  Reproductive/Obstetrics                             Anesthesia Physical Anesthesia Plan  ASA: 3  Anesthesia Plan: MAC   Post-op Pain Management:    Induction: Intravenous  PONV Risk Score and Plan: Propofol infusion and Treatment may vary due to age or medical condition  Airway Management Planned: Natural Airway  Additional Equipment:   Intra-op Plan:   Post-operative Plan:   Informed Consent: I have reviewed the patients History and Physical, chart, labs and discussed the procedure including the risks, benefits and alternatives for the proposed anesthesia with the patient or authorized representative who has indicated his/her understanding and acceptance.     Dental advisory given  Plan Discussed with: CRNA  Anesthesia Plan Comments:        Anesthesia Quick Evaluation

## 2022-07-07 NOTE — Transfer of Care (Signed)
Immediate Anesthesia Transfer of Care Note  Patient: Derrick Alexander  Procedure(s) Performed: ESOPHAGOGASTRODUODENOSCOPY (EGD) WITH PROPOFOL HOT HEMOSTASIS (ARGON PLASMA COAGULATION/BICAP)  Patient Location: PACU  Anesthesia Type:MAC  Level of Consciousness: drowsy and patient cooperative  Airway & Oxygen Therapy: Patient Spontanous Breathing and Patient connected to face mask oxygen  Post-op Assessment: Report given to RN, Post -op Vital signs reviewed and stable, and Patient moving all extremities X 4  Post vital signs: Reviewed and stable  Last Vitals:  Vitals Value Taken Time  BP 123/78 07/07/22 1340  Temp    Pulse 91 07/07/22 1341  Resp 17 07/07/22 1341  SpO2 100 % 07/07/22 1341  Vitals shown include unvalidated device data.  Last Pain:  Vitals:   07/07/22 1206  TempSrc: Temporal  PainSc: 0-No pain         Complications: No notable events documented.

## 2022-07-07 NOTE — Progress Notes (Signed)
PROGRESS NOTE  Derrick Alexander [id number] DOB: 14-Jun-1945   PCP: [name], MD  Patient is from: Home.  Lives with family.  DOA: 07/06/2022 LOS: 1  Chief complaints Chief Complaint  Patient presents with   Shortness of Breath     Brief Narrative / Interim history: 77 year old M with PMH of A-fib on Xarelto, GIB/gastric AVMs, OSA on CPAP, diastolic CHF, COPD, morbid obesity, HTN and HLD presenting with melena for about a week, and admitted for symptomatic anemia due to acute upper GI bleed, elevated troponin and some respiratory distress.  Patient was hospitalized 04/27/2022-04/30/2022 for acute upper GI bleed with gastric AVM that was treated with APC.  He reported vague chest pain.  Troponin 796 and trended up to 3612.  EKG with some ST depressions in lateral leads which appears to be new.  2 units of blood ordered.  GI consulted and patient was admitted.   Patient had additional 1 unit earlier this morning.  He denies chest pain.  Cardiology consulted.   Subjective: Seen and examined earlier this morning.  No major events overnight of this morning.  Reports feeling better from breathing standpoint.  He states he had bad dreams.  Also reports feeling hot inside his belly but denies pain.  He also denies chest pain.  Reports a cough.  Daughter and wife at bedside.  Objective: Vitals:   07/07/22 0800 07/07/22 0835 07/07/22 1100 07/07/22 1206  BP: (!) 117/50  118/65 122/65  Pulse: 75  81 93  Resp: 16 20 20 $ (!) 26  Temp:    (!) 97.5 F (36.4 C)  TempSrc:    Temporal  SpO2: 100%  98% 95%  Weight:      Height:        Examination:  GENERAL: No apparent distress.  Nontoxic. HEENT: MMM.  Vision and hearing grossly intact.  NECK: Supple.  No apparent JVD.  RESP:  No IWOB.  Fair aeration bilaterally. CVS:  RRR. Heart sounds normal.  ABD/GI/GU: BS+. Abd soft, NTND.  MSK/EXT:  Moves extremities. No apparent deformity. No edema.  SKIN: no apparent skin lesion or wound NEURO:  Awake, alert and oriented appropriately.  No apparent focal neuro deficit. PSYCH: Calm. Normal affect.   Procedures:  None  Microbiology summarized: [id number], influenza and RSV PCR nonreactive.  Assessment and plan: Principal Problem:   Acute upper GI bleeding Active Problems:   Chronic obstructive pulmonary disease (HCC)   Paroxysmal atrial fibrillation with RVR (HCC)   Morbid obesity with BMI of 40.0-44.9, adult (HCC)   Essential hypertension   Paroxysmal atrial fibrillation (HCC)   OSA on CPAP   Anemia due to gastrointestinal blood loss   Glaucoma (increased eye pressure)   Acute on chronic blood loss anemia   Chronic diastolic CHF (congestive heart failure) (HCC)   Elevated troponin   Abnormal EKG   Chronic obstructive pulmonary disease (COPD) (HCC)  Acute on chronic blood loss anemia due to upper GI bleed: Patient with history of gastric AVMs that was treated with APC in 04/2022.  He is on Xarelto for atrial fibrillation.  Transfuse 3 units so far.  Hgb improved. Recent Labs    04/28/22 0620 04/28/22 1811 04/29/22 0500 04/29/22 2156 04/30/22 0535 05/05/22 0956 07/06/22 0904 07/06/22 1849 07/07/22 0126 07/07/22 0932  HGB 8.3* 8.0* 8.4* 8.4* 7.9* 7.8* 5.9* 7.8* 6.7* 8.2*  -Continue IV Protonix -Maintain 2 PIV -Monitor H&H and transfuse for Hgb < 8 -Continue holding Xarelto -Plan for endoscopy today  Respiratory distress: Likely  due to the above.  He also has history of COPD but without exacerbation. -Monitor anemia as above -Nebulizers as needed -Continue home inhalers   Elevated troponin/abnormal EKG: Troponin 800>> 3600>> 3400.  EKG with some ST depression in lateral leads.  I do not know if all of this is from demand ischemia due to anemia. -Consulted cardiology.   Paroxysmal atrial fibrillation: Currently in sinus rhythm with PVCs.  Does not seem to be reasonable rate control meds.  CHA2DS2-VASc score > 4. -Continue holding Xarelto.   Chronic  COPD -Continue on inhalers -Change as needed albuterol to Xopenex.  Chronic diastolic CHF: TTE in [id number] with LVEF of 60 to 65% and G2 DD.  Appears euvolemic on exam.  BNP slightly up likely in the setting of anemia, IV fluid and blood transfusion. -Will discontinue IV fluids once tolerating p.o. -Resume diuretics   Essential hypertension: Normotensive off home antihypertensive meds. -Hold irbesartan, spironolactone for now given marginal BP in the setting of GI bleeding   OSA -Nightly CPAP  HLD -Continue rosuvastatin   Glaucoma -Continue latanoprost   Morbid obesity Body mass index is 40.03 kg/m.           DVT prophylaxis:  SCDs Start: 07/06/22 1310  Code Status: Full code Family Communication: Updated patient's wife and daughter at bedside Level of care: Progressive Status is: Inpatient Remains inpatient appropriate because: Blood loss anemia and elevated troponin   Final disposition: TBD Consultants:  Neurology Cardiology   55 minutes with more than 50% spent in reviewing records, counseling patient/family and coordinating care.   Sch Meds:  Scheduled Meds:  [MAR Hold] latanoprost  1 drop Both Eyes QHS   [MAR Hold] mometasone-formoterol  2 puff Inhalation BID   [MAR Hold] pantoprazole (PROTONIX) IV  40 mg Intravenous Q12H   [MAR Hold] rosuvastatin  20 mg Oral Daily   [MAR Hold] sodium chloride flush  3 mL Intravenous Q12H   Continuous Infusions:  sodium chloride     lactated ringers Stopped (07/07/22 1200)   PRN Meds:.[MAR Hold] acetaminophen **OR** [MAR Hold] acetaminophen, [MAR Hold] albuterol, [MAR Hold] hydrALAZINE, [MAR Hold]  morphine injection, [MAR Hold] polyvinyl alcohol  Antimicrobials: Anti-infectives (From admission, onward)    None        I have personally reviewed the following labs and images: CBC: Recent Labs  Lab 07/06/22 0904 07/06/22 1849 07/07/22 0126 07/07/22 0932  WBC 13.6* 17.0*  --  15.5*  HGB 5.9* 7.8* 6.7*  8.2*  HCT 19.7* 23.4* 21.1* 24.5*  MCV 96.1 90.7  --  91.1  PLT 181 166  --  152   BMP &GFR Recent Labs  Lab 07/06/22 0904 07/07/22 0932  NA 135 135  K 4.8 4.1  CL 104 106  CO2 19* 22  GLUCOSE 139* 142*  BUN 36* 28*  CREATININE 1.19 0.85  CALCIUM 8.5* 8.5*   Estimated Creatinine Clearance: 85.8 mL/min (by C-G formula based on SCr of 0.85 mg/dL). Liver & Pancreas: Recent Labs  Lab 07/06/22 0904  AST 38  ALT 12  ALKPHOS 39  BILITOT 0.6  PROT 5.0*  ALBUMIN 3.1*   No results for input(s): "LIPASE", "AMYLASE" in the last 168 hours. No results for input(s): "AMMONIA" in the last 168 hours. Diabetic: No results for input(s): "HGBA1C" in the last 72 hours. No results for input(s): "GLUCAP" in the last 168 hours. Cardiac Enzymes: No results for input(s): "CKTOTAL", "CKMB", "CKMBINDEX", "TROPONINI" in the last 168 hours. Recent Labs    05/05/22 253 547 1106  PROBNP 756*   Coagulation Profile: No results for input(s): "INR", "PROTIME" in the last 168 hours. Thyroid Function Tests: No results for input(s): "TSH", "T4TOTAL", "FREET4", "T3FREE", "THYROIDAB" in the last 72 hours. Lipid Profile: No results for input(s): "CHOL", "HDL", "LDLCALC", "TRIG", "CHOLHDL", "LDLDIRECT" in the last 72 hours. Anemia Panel: No results for input(s): "VITAMINB12", "FOLATE", "FERRITIN", "TIBC", "IRON", "RETICCTPCT" in the last 72 hours. Urine analysis: No results found for: "COLORURINE", "APPEARANCEUR", "LABSPEC", "PHURINE", "GLUCOSEU", "HGBUR", "BILIRUBINUR", "KETONESUR", "PROTEINUR", "UROBILINOGEN", "NITRITE", "LEUKOCYTESUR" Sepsis Labs: Invalid input(s): "PROCALCITONIN", "LACTICIDVEN"  Microbiology: Recent Results (from the past 240 hour(s))  Resp panel by RT-PCR (RSV, Flu A&B, Covid) Anterior Nasal Swab     Status: None   Collection Time: 07/06/22  8:08 AM   Specimen: Anterior Nasal Swab  Result Value Ref Range Status   SARS Coronavirus 2 by RT PCR NEGATIVE NEGATIVE Final   Influenza A by  PCR NEGATIVE NEGATIVE Final   Influenza B by PCR NEGATIVE NEGATIVE Final    Comment: (NOTE) The Xpert Xpress SARS-CoV-2/FLU/RSV plus assay is intended as an aid in the diagnosis of influenza from Nasopharyngeal swab specimens and should not be used as a sole basis for treatment. Nasal washings and aspirates are unacceptable for Xpert Xpress SARS-CoV-2/FLU/RSV testing.  Fact Sheet for Patients: EntrepreneurPulse.com.au  Fact Sheet for Healthcare Providers: IncredibleEmployment.be  This test is not yet approved or cleared by the [place] FDA and has been authorized for detection and/or diagnosis of SARS-CoV-2 by FDA under an Emergency Use Authorization (EUA). This EUA will remain in effect (meaning this test can be used) for the duration of the COVID-19 declaration under Section 564(b)(1) of the Act, 21 U.S.C. section 360bbb-3(b)(1), unless the authorization is terminated or revoked.     Resp Syncytial Virus by PCR NEGATIVE NEGATIVE Final    Comment: (NOTE) Fact Sheet for Patients: EntrepreneurPulse.com.au  Fact Sheet for Healthcare Providers: IncredibleEmployment.be  This test is not yet approved or cleared by the [place] FDA and has been authorized for detection and/or diagnosis of SARS-CoV-2 by FDA under an Emergency Use Authorization (EUA). This EUA will remain in effect (meaning this test can be used) for the duration of the COVID-19 declaration under Section 564(b)(1) of the Act, 21 U.S.C. section 360bbb-3(b)(1), unless the authorization is terminated or revoked.  Performed at [facility], [place] [address] [place], [place] [address]     Radiology Studies: No results found.    Ezekiel Menzer T. [place]  If 7PM-7AM, please contact night-coverage [email or web address] 07/07/2022, 1:31 PM

## 2022-07-07 NOTE — Consult Note (Addendum)
Cardiology Consultation   Patient ID: Derrick Alexander MRN: [id number]; DOB: 04-27-1946  Admit date: 07/06/2022 Date of Consult: 07/07/2022  PCP:  [name], [facility] Cardiologist:  Pixie Casino, MD  Electrophysiologist:  Constance Haw, MD       Patient Profile:   Derrick Alexander is a 77 y.o. male with a hx of remote NSVT, paroxysmal afib s/p ablation 08/2020, PVCs, nocturnal bradycardia, RBBB+LAFB, moderate aortic stenosis and diastolic dysfunction by echo 03/2022, GIB 04/2022 with gastric AVMs, carotid artery disease ([id number] RICA, [id number] LICA [id number]), hepatic steatosis, COPD, HTN, obesity, longstanding tobacco abuse, OSA on CPAP, normal nuc 2017 with coronary and aortic atherosclerosis by pre-ablation CT 08/2020, suspected chronic HFpEF who is being seen 07/07/2022 for the evaluation of elevated troponin at the request of Dr. Cyndia Skeeters.  History of Present Illness:   Mr. Buth carries the above hx which has been primarily driven by EP/arrhythmia issues over the years. He had syncope in 2017 with remote monitor showing 16 beats NSVT and bradycardia down into the 30s when sleeping. He also has h/o Afib, failed flecainide and amiodarone, with AF ablation 08/2020. In 01/2022 he saw Dr. Curt Bears for SOB and wore a Zio showing 9% AF burden 13% PVC burden. Echo showed EF 60-65%, G2DD, elevated LVEDP, mildly elevated PASP, moderate AS. No medication changes advised at that time with normal EF. In 04/2022 he was found to have ABL anemia with Hgb down to 7's (down from 12's in 2022) with UGIB requiring 3 U PRBCs s/p endoscopy treated with APC. Xarelto was resumed at DC. Last CBC 05/05/22 7.8.  He was feeling better at f/u 06/02/22. Regarding prior ischemic eval, nuc 2017 was normal. Pre-ablation CT showed CAC 361/58%ile, not timed to evaluate the individual vessels. He does not carry formal hx of CHF but takes Lasix so suspect component of chronic HFpEF.  He presented back to  the hospital with worsening SOB for the past 5-7 days progressing to the point of dyspnea at rest. He has a history of chronic BRBPR but had noted melena recently as well. He was not sure if this was perhaps related to his iron pill. He also became progressively weaker and had a syncopal spell on Wednesday 2/14 - he had called out for his wife who found him having lowered himself to the ground on one knee, still awake at that time, but then began to look off in the distance and lost consciousness for about a minute. Did not require CPR. Wife tried to urge him to seek care but he declined, until he got so weak yesterday that they summed EMS. Wife says his O2 was OK but he was in respiratory distress. He required NRB then CPAP. He apparently had another brief syncopal episode with them present. He was found to have Hgb 5.9, elevated BUN 36, Cr 1.19, BNP 522, hs-troponin 796->3,612>3,438>4,113. Xarelto was held and 3 U PRBCs given. He underwent EGD today showing mildly erythematous mucosa in the gastric body and three erythematous patches in the gastric lumen, nonbleeding, not able to be discerned if representative of AVMs, s/p ablation. GI gave permission to restart anticoagulation and if bleeding recurs, pursue colonoscopy. Given abnormal EKG and elevated troponin, cardiology consulted for further evaluation. Upon evaluation, he was SOB with increased WOB so I called Dr. Stanford Breed to evaluate. He also appears to have gone back in AF with rates 90s-1teens shortly before 11am. His wife says historically she can tell when he  goes out of rhythm because his breathing worsens. He smoked from age 70 to 75. He denies any recent chest pain. No LEE. Seems more SOB with recumbency when obtaining EKG.   Past Medical History:  Diagnosis Date   A-fib [facility])    Abnormal EKG 06/18/2015   Aortic atherosclerosis (HCC)    AV malformation of GI tract    Bradycardia    Carotid artery disease (HCC)    COPD (chronic obstructive  pulmonary disease) (HCC)    COPD with exacerbation (HCC)    Diastolic dysfunction    Faintness 06/18/2015   GIB (gastrointestinal bleeding)    Hepatic steatosis    by imaging   Hypertension    LBBB (left bundle branch block) 06/18/2015   Moderate aortic stenosis    NSVT (nonsustained ventricular tachycardia) (HCC)    Obesity    OSA on CPAP    PVC's (premature ventricular contractions)    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 (LAFB)    Syncope 08/06/2015   Tobacco use    V-tach ([place]) 08/06/2015    Past Surgical History:  Procedure Laterality Date   ATRIAL FIBRILLATION ABLATION N/A 09/17/2020   Procedure: ATRIAL FIBRILLATION ABLATION;  Surgeon: Constance Haw, MD;  Location: [facility];  Service: Cardiovascular;  Laterality: N/A;   COLONOSCOPY WITH PROPOFOL N/A 03/07/2019   Procedure: COLONOSCOPY WITH PROPOFOL;  Surgeon: Carol Ada, MD;  Location: [facility];  Service: Endoscopy;  Laterality: N/A;   ESOPHAGOGASTRODUODENOSCOPY (EGD) WITH PROPOFOL N/A 04/28/2022   Procedure: ESOPHAGOGASTRODUODENOSCOPY (EGD) WITH PROPOFOL;  Surgeon: Carol Ada, MD;  Location: [facility];  Service: Gastroenterology;  Laterality: N/A;   HOT HEMOSTASIS N/A 04/28/2022   Procedure: HOT HEMOSTASIS (ARGON PLASMA COAGULATION/BICAP);  Surgeon: Carol Ada, MD;  Location: [facility];  Service: Gastroenterology;  Laterality: N/A;   POLYPECTOMY  03/07/2019   Procedure: POLYPECTOMY;  Surgeon: Carol Ada, MD;  Location: [facility];  Service: Endoscopy;;     Home Medications:  Prior to Admission medications   Medication Sig Start Date End Date Taking? Authorizing Provider  acetaminophen (TYLENOL) 650 MG CR tablet Take 1,300 mg by mouth 2 (two) times daily as needed for pain.   Yes [provider]  albuterol (PROVENTIL) (2.5 MG/3ML) 0.083% nebulizer solution Take 3 mLs (2.5 mg total) by nebulization every 4 (four) hours as needed for wheezing or  shortness of breath. 04/30/22  Yes Kathie Dike, MD  albuterol (VENTOLIN HFA) 108 (90 Base) MCG/ACT inhaler Inhale 2 puffs into the lungs every 6 (six) hours as needed for wheezing or shortness of breath. 08/18/21  Yes Margaretha Seeds, MD  cetirizine (ZYRTEC) 10 MG tablet Take 10 mg by mouth daily.   Yes [provider]  famotidine (PEPCID) 20 MG tablet Take 20 mg by mouth at bedtime.    Yes [provider]  Ferrous Sulfate (IRON) 325 (65 Fe) MG TABS Take 325 mg by mouth daily.   Yes [provider]  fluticasone-salmeterol (ADVAIR DISKUS) 250-50 MCG/ACT AEPB Inhale 1 puff into the lungs in the morning and at bedtime. 02/14/22  Yes Margaretha Seeds, MD  furosemide (LASIX) 40 MG tablet Take 1.5 tablets (60 mg total) by mouth 2 (two) times daily. 03/14/22  Yes Camnitz, Will Hassell Done, MD  irbesartan (AVAPRO) 300 MG tablet TAKE 1 TABLET(300 MG) BY MOUTH DAILY Patient taking differently: Take 300 mg by mouth every evening. 06/02/22  Yes Camnitz, Will Hassell Done, MD  latanoprost (XALATAN) 0.005 % ophthalmic solution Place  1 drop into both eyes at bedtime.   Yes [provider]  pantoprazole (PROTONIX) 40 MG tablet Take 1 tablet (40 mg total) by mouth daily. 04/30/22 04/30/23 Yes Kathie Dike, MD  Polyethyl Glycol-Propyl Glycol (SYSTANE OP) Place 1 drop into both eyes daily as needed (dry eyes).   Yes [provider]  rosuvastatin (CRESTOR) 20 MG tablet Take 20 mg by mouth daily. 12/12/18  Yes [provider]  spironolactone (ALDACTONE) 25 MG tablet Take 1 tablet (25 mg total) by mouth daily. 07/23/18  Yes Hilty, Nadean Corwin, MD  XARELTO 20 MG TABS tablet TAKE 1 TABLET BY MOUTH EVERY DAY WITH DINNER Patient taking differently: Take 20 mg by mouth daily with supper. 02/14/22  Yes Camnitz, Ocie Doyne, MD    Inpatient Medications: Scheduled Meds:  latanoprost  1 drop Both Eyes QHS   metoprolol tartrate  12.5 mg Oral BID   mometasone-formoterol  2 puff  Inhalation BID   pantoprazole (PROTONIX) IV  40 mg Intravenous Q12H   rosuvastatin  20 mg Oral Daily   sodium chloride flush  3 mL Intravenous Q12H   Continuous Infusions:   PRN Meds: acetaminophen **OR** acetaminophen, albuterol, hydrALAZINE, morphine injection, polyvinyl alcohol  Allergies:    Allergies  Allergen Reactions   Cardizem [Diltiazem] Shortness Of Breath   Pacerone [Amiodarone] Shortness Of Breath, Nausea Only and Other (See Comments)    Abdominal pain Dizziness   Tambocor [Flecainide] Shortness Of Breath    Social History:   Social History   Socioeconomic History   Marital status: Married    Spouse name: Not on file   Number of children: Not on file   Years of education: Not on file   Highest education level: Not on file  Occupational History   Occupation: retired  Tobacco Use   Smoking status: Former    Packs/day: 2.00    Years: 60.00    Total pack years: 120.00    Types: Cigarettes    Quit date: 06/13/2017    Years since quitting: 5.0   Smokeless tobacco: Never  Vaping Use   Vaping Use: Never used  Substance and Sexual Activity   Alcohol use: No    Alcohol/week: 0.0 standard drinks of alcohol   Drug use: No   Sexual activity: Not on file  Other Topics Concern   Not on file  Social History Narrative   Not on file   Social Determinants of Health   Financial Resource Strain: Not on file  Food Insecurity: No Food Insecurity (04/28/2022)   Hunger Vital Sign    Worried About Running Out of Food in the Last Year: Never true    [place] in the Last Year: Never true  Transportation Needs: No Transportation Needs (04/28/2022)   PRAPARE - Hydrologist (Medical): No    Lack of Transportation (Non-Medical): No  Physical Activity: Not on file  Stress: Not on file  Social Connections: Not on file  Intimate Partner Violence: Not At Risk (04/28/2022)   Humiliation, Afraid, Rape, and Kick questionnaire    Fear of  Current or Ex-Partner: No    Emotionally Abused: No    Physically Abused: No    Sexually Abused: No    Family History:    Family History  Problem Relation Age of Onset   Heart disease Brother    COPD Brother    Diabetes Mother      ROS:  Please see the history of present  illness.  All other ROS reviewed and negative.     Physical Exam/Data:   Vitals:   07/07/22 1351 07/07/22 1355 07/07/22 1357 07/07/22 1429  BP: 123/65   102/89  Pulse: 69  69 62  Resp: 19  (!) 23 (!) 22  Temp:      TempSrc:      SpO2: 98% 98% 97% 97%  Weight:      Height:        Intake/Output Summary (Last 24 hours) at 07/07/2022 1549 Last data filed at 07/07/2022 1335 Gross per 24 hour  Intake 933 ml  Output 300 ml  Net 633 ml      07/06/2022    8:11 AM 06/02/2022   10:34 AM 05/09/2022    8:57 AM  Last 3 Weights  Weight (lbs) 248 lb 0.3 oz 248 lb 248 lb  Weight (kg) 112.5 kg 112.492 kg 112.492 kg     Body mass index is 40.03 kg/m.  General: Obese WM with increased WOB Head: Normocephalic, atraumatic, sclera non-icteric, no xanthomas, nares are without discharge. Neck: Negative for carotid bruits. JVP not elevated. Lungs: Clear bilaterally to auscultation without wheezes, rales, or rhonchi. Breathing is unlabored. Heart: Irregularly irregular, rate borderline, S1 S2 soft SEM RUSB without murmurs, rubs, or gallops.  Abdomen: Soft, non-tender, non-distended with normoactive bowel sounds. No rebound/guarding. Extremities: No clubbing or cyanosis. No edema. Distal pedal pulses are 2+ and equal bilaterally. Neuro: Alert and oriented X 3. Moves all extremities spontaneously. Psych:  Responds to questions appropriately with a normal affect.   EKG:  The EKG was personally reviewed and demonstrates:  Yesterday: NSR 88bpm, RBBB+LAFB, occasional PVC, ST depression noted I, II, V3-V6 Today: NSR 91bpm, iRBBB+LAFB pattern, occasional PVC, improvement in ST depression, QTc hand calculated at  462m  Telemetry:  Telemetry was personally reviewed and demonstrates:  NSR with PVCs previously, then went into AF RVR with occasional PVCs around 10:48am  Relevant CV Studies:  Echo 03/2022   1. Left ventricular ejection fraction, by estimation, is 60 to 65%. The  left ventricle has normal function. The left ventricle has no regional  wall motion abnormalities. There is moderate left ventricular hypertrophy.  Left ventricular diastolic  parameters are consistent with Grade II diastolic dysfunction  (pseudonormalization). Elevated left ventricular end-diastolic pressure.  The E/e' is 179   2. Right ventricular systolic function is normal. The right ventricular  size is normal. There is mildly elevated pulmonary artery systolic  pressure. The estimated right ventricular systolic pressure is [id number].   3. The mitral valve is abnormal. Trivial mitral valve regurgitation.   4. The aortic valve is calcified. Aortic valve regurgitation is not  visualized. Moderate aortic valve stenosis. Aortic valve area, by VTI  measures 1.06 cm. Aortic valve mean gradient measures 26.4 mmHg. Aortic  valve Vmax measures 3.62 m/s.   5. The inferior vena cava is normal in size with <50% respiratory  variability, suggesting right atrial pressure of 8 mmHg.    Monitor 02/2022 Predominant rhythm was sinus rhythm 9% atrial fibrillation burden. 1.3% supraventricular ectopy 12.1% ventricular ectopy No triggered episodes recorded   Will Camnitz, MD  Comparison(s): Changes from prior study are noted. 04/29/2018: LVEF 60-65%,  grade 1 DD.   Carotid 12/2020  Summary:  Right Carotid: Velocities in the right ICA are consistent with a 1-39%  stenosis.   Left Carotid: Velocities in the left ICA are consistent with a 40-59%  stenosis.   Vertebrals: Left vertebral artery demonstrates antegrade flow.  Right  vertebral             artery was not visualized.   *See table(s) above for measurements and  observations.      Electronically signed by Servando Snare MD on 01/06/2021 at 1:37:10 PM.      Laboratory Data:  High Sensitivity Troponin:   Recent Labs  Lab 07/06/22 0904 07/06/22 1849 07/07/22 0932 07/07/22 1128  TROPONINIHS 796* 3,612* 3,438* 4,113*     Chemistry Recent Labs  Lab 07/06/22 0904 07/07/22 0932  NA 135 135  K 4.8 4.1  CL 104 106  CO2 19* 22  GLUCOSE 139* 142*  BUN 36* 28*  CREATININE 1.19 0.85  CALCIUM 8.5* 8.5*  GFRNONAA >60 >60  ANIONGAP 12 7    Recent Labs  Lab 07/06/22 0904  PROT 5.0*  ALBUMIN 3.1*  AST 38  ALT 12  ALKPHOS 39  BILITOT 0.6   Lipids No results for input(s): "CHOL", "TRIG", "HDL", "LABVLDL", "LDLCALC", "CHOLHDL" in the last 168 hours.  Hematology Recent Labs  Lab 07/06/22 0904 07/06/22 1849 07/07/22 0126 07/07/22 0932  WBC 13.6* 17.0*  --  15.5*  RBC 2.05* 2.58*  --  2.69*  HGB 5.9* 7.8* 6.7* 8.2*  HCT 19.7* 23.4* 21.1* 24.5*  MCV 96.1 90.7  --  91.1  MCH 28.8 30.2  --  30.5  MCHC 29.9* 33.3  --  33.5  RDW 19.6* 17.7*  --  17.1*  PLT 181 166  --  152   Thyroid No results for input(s): "TSH", "FREET4" in the last 168 hours.  BNP Recent Labs  Lab 07/06/22 0904 07/07/22 0932  BNP 266.1* 522.0*    DDimer No results for input(s): "DDIMER" in the last 168 hours.   Radiology/Studies:  DG Chest Port 1 View  Result Date: 07/06/2022 CLINICAL DATA:  Shortness of breath EXAM: PORTABLE CHEST 1 VIEW COMPARISON:  04/29/2022 FINDINGS: Lungs are clear.  No pleural effusion or pneumothorax. The heart is normal in size. IMPRESSION: No evidence of acute cardiopulmonary disease. Electronically Signed   By: Julian Hy M.D.   On: 07/06/2022 08:39     Assessment and Plan:   1. Acute blood loss anemia with GI bleeding (recurrent episode - recent GIB 04/2022 s/p APC of AVMs) - per GI, IM - s/p 3 PRBCs thus far per note - have reached out to IM to notify of worsening SOB in case they want to trend H/H's further - would  consider colonoscopy to fully exclude source of bleeding given pt's report of chronic BRBPR and need for ongoing anticoagulation  2. NSTEMI - unclear if this represents underlying CAD versus purely demand ischemia - unable to pursue antiplatelet therapy or heparin at this time - add low dose metoprolol 12.23m BID - continue rosuvastatin 290mdaily - check lipids in AM - will need to follow clinical course to ultimately decide on ischemic evaluation eventually but not presently a candidate for this due to inability to safely add additional blood thinners  3. Paroxysmal atrial fibrillation, previously failed flecainide/amiodarone with ablation 2022 with 9% AF burden 02/2022, with recurrence this morning - continue to hold anticoagulation with GI bleeding - start metoprolol 12.53m43mID - no plans for acute DCCV given known paroxysmal arrhythmia and GIB - may need to consider Watchman as OP  3. Acute (on suspected chronic) HFpEF - appears to have increased WOB, concern for developing fluid overload with IV fluids + PRBCs - saline lock IVF and give IV Lasix 75m43m  1 stat- relayed request to nurse  - repeat echo given ?troponin and SOB  4. Moderate aortic stenosis - repeat echo - cap IVF given concern for fluid overload  5. Essential HTN - manage in context above  6. COPD, former longstanding tobacco abuse - recommend assessment for home O2 at dc and follow-up with pulmonary  7. PVCs, frequent (12% burden 02/2022) - follow with addition of metoprolol as above - check TSH, magnesium - K OK  8. OSA - reports adherence to CPAP, will order QHS  9. Carotid artery disease - continue statin - needs carotid duplex at some point when more stable, last done 2022     Risk Assessment/Risk Scores:     TIMI Risk Score for Unstable Angina or Non-ST Elevation MI:   The patient's TIMI risk score is 4, which indicates a 20% risk of all cause mortality, new or recurrent myocardial infarction  or need for urgent revascularization in the next 14 days.  New York Heart Association (NYHA) Functional Class NYHA Class IV  CHA2DS2-VASc Score = 5   This indicates a 7.2% annual risk of stroke. The patient's score is based upon: CHF History: 1 HTN History: 1 Diabetes History: 0 Stroke History: 0 Vascular Disease History: 1 Age Score: 2 Gender Score: 0    For questions or updates, please contact [facility] Please consult [email or web address] for contact info under    Signed, Charlie Pitter, PA-C  07/07/2022 3:49 PM As above, patient seen and examined.  Briefly he is a 77 year old male with past medical history paroxysmal atrial fibrillation status post ablation April 2022, moderate aortic stenosis, diastolic dysfunction, GI bleed secondary to gastric AVMs, COPD, hypertension, tobacco abuse, obstructive sleep apnea for evaluation of elevated troponin, recurrent atrial fibrillation and acute on chronic diastolic congestive heart failure.  Last echocardiogram November 2023 showed normal LV function, moderate left ventricular hypertrophy, grade 2 diastolic dysfunction, moderate aortic stenosis with mean gradient 26 mmHg and aortic valve area 1.06 cm.  Patient had GI bleed December 2023 requiring 3 units packed red blood cells.  Patient had EGD revealing gastric AVMs which was treated.  Xarelto was ultimately resumed.  Over the preceding week he noted progressive weakness with activities as well as dyspnea on exertion.  He also has had orthopnea but no pedal edema, chest pain, palpitations or syncope.  He presented for further evaluation and his hemoglobin was noted to be 5.9 and he was admitted.  He has been transfused 3 units packed red blood cells.  He also developed recurrent atrial fibrillation and troponin noted to be elevated.  Cardiology now asked to evaluate.  At time of my evaluation he does note dyspnea.  Chest x-ray without acute disease.  Hemoglobin improved to 8.2 following 3 units  packed red blood cells.  BNP 522.  Troponin 796, 3612, 3438 and 4113.  Electrocardiogram shows sinus rhythm with PVCs, right bundle branch block.  Telemetry shows atrial fibrillation.  1 elevated troponin-patient is not complaining of chest pain and no diagnostic ST changes.  This is likely secondary to demand ischemia related to recurrent atrial fibrillation, GI bleed/anemia superimposed on underlying coronary disease and acute on chronic diastolic congestive heart failure.  He is not a candidate for ischemia evaluation at present given active bleeding.  2 acute on chronic diastolic congestive heart failure-patient is dyspneic at the time of my evaluation.  This is likely secondary to the volume associated with his transfusion superimposed on severe anemia and moderate aortic stenosis.  Will give Lasix 40 mg IV x 1.  Will follow and diurese as needed.  Repeat echocardiogram.  3 recurrent atrial fibrillation-will add low-dose metoprolol at 12.5 mg twice daily.  Follow closely on telemetry given history of bradycardia.  No anticoagulation in the setting of active bleeding.  Once he recovers would refer for Watchman device.  4 aortic stenosis-moderate on most recent echocardiogram.  Will repeat.  5 GI bleed-evaluation per gastroenterology.  Given this is his second bleed colonoscopy likely indicated.  Follow hemoglobin and transfuse as needed.  Kirk Ruths, MD

## 2022-07-07 NOTE — Anesthesia Procedure Notes (Signed)
Procedure Name: MAC Date/Time: 07/07/2022 1:24 PM  Performed by: Darletta Moll, CRNAPre-anesthesia Checklist: Patient identified, Emergency Drugs available, Suction available and Patient being monitored Patient Re-evaluated:Patient Re-evaluated prior to induction Oxygen Delivery Method: Simple face mask

## 2022-07-07 NOTE — Progress Notes (Signed)
MEWS Progress Note  Patient Details Name: Derrick Alexander MRN: [id number] DOB: Aug 08, 1945 Today's Date: 07/07/2022   MEWS Flowsheet Documentation:  Assess: MEWS Score Temp: 97.9 F (36.6 C) BP: (!) 114/55 MAP (mmHg): 74 Pulse Rate: 90 ECG Heart Rate: 89 Resp: (!) 26 Level of Consciousness: Alert SpO2: 97 % O2 Device: Room Air Patient Activity (if Appropriate): In bed O2 Flow Rate (L/min): 2 L/min FiO2 (%): 28 % Assess: MEWS Score MEWS Temp: 0 MEWS Systolic: 0 MEWS Pulse: 0 MEWS RR: 2 MEWS LOC: 0 MEWS Score: 2 MEWS Score Color: Yellow Assess: SIRS CRITERIA SIRS Temperature : 0 SIRS Respirations : 1 SIRS Pulse: 0 SIRS WBC: 1 SIRS Score Sum : 2 SIRS Temperature : 0 SIRS Pulse: 0 SIRS Respirations : 1 SIRS WBC: 1 SIRS Score Sum : 2 Assess: if the MEWS score is Yellow or Red Were vital signs taken at a resting state?: Yes Focused Assessment: No change from prior assessment Does the patient meet 2 or more of the SIRS criteria?: Yes Does the patient have a confirmed or suspected source of infection?: No MEWS guidelines implemented : Yes, yellow Treat MEWS Interventions: Considered administering scheduled or prn medications/treatments as ordered Take Vital Signs Increase Vital Sign Frequency : Yellow: Q2hr x1, continue Q4hrs until patient remains green for 12hrs Escalate MEWS: Escalate: Yellow: Discuss with charge nurse and consider notifying provider and/or RRT Notify: Charge Nurse/RN Name of Charge Nurse/RN Notified: Janit Bern 07/07/2022, 7:25 PM

## 2022-07-08 ENCOUNTER — Inpatient Hospital Stay (HOSPITAL_COMMUNITY): Payer: Medicare Other

## 2022-07-08 DIAGNOSIS — D649 Anemia, unspecified: Secondary | ICD-10-CM

## 2022-07-08 DIAGNOSIS — Z7901 Long term (current) use of anticoagulants: Secondary | ICD-10-CM

## 2022-07-08 DIAGNOSIS — I4891 Unspecified atrial fibrillation: Secondary | ICD-10-CM

## 2022-07-08 LAB — CBC WITH DIFFERENTIAL/PLATELET
Abs Immature Granulocytes: 0.07 10*3/uL (ref 0.00–0.07)
Basophils Absolute: 0.1 10*3/uL (ref 0.0–0.1)
Basophils Relative: 0 %
Eosinophils Absolute: 0 10*3/uL (ref 0.0–0.5)
Eosinophils Relative: 0 %
HCT: 24.3 % — ABNORMAL LOW (ref 39.0–52.0)
Hemoglobin: 7.9 g/dL — ABNORMAL LOW (ref 13.0–17.0)
Immature Granulocytes: 0 %
Lymphocytes Relative: 8 %
Lymphs Abs: 1.3 10*3/uL (ref 0.7–4.0)
MCH: 29.8 pg (ref 26.0–34.0)
MCHC: 32.5 g/dL (ref 30.0–36.0)
MCV: 91.7 fL (ref 80.0–100.0)
Monocytes Absolute: 1.5 10*3/uL — ABNORMAL HIGH (ref 0.1–1.0)
Monocytes Relative: 9 %
Neutro Abs: 13.1 10*3/uL — ABNORMAL HIGH (ref 1.7–7.7)
Neutrophils Relative %: 83 %
Platelets: 159 10*3/uL (ref 150–400)
RBC: 2.65 MIL/uL — ABNORMAL LOW (ref 4.22–5.81)
RDW: 17.3 % — ABNORMAL HIGH (ref 11.5–15.5)
WBC: 16 10*3/uL — ABNORMAL HIGH (ref 4.0–10.5)
nRBC: 0.3 % — ABNORMAL HIGH (ref 0.0–0.2)

## 2022-07-08 LAB — LIPID PANEL
Cholesterol: 77 mg/dL (ref 0–200)
HDL: 28 mg/dL — ABNORMAL LOW (ref >40)
LDL Cholesterol: 25 mg/dL (ref 0–99)
Total CHOL/HDL Ratio: 2.8 RATIO
Triglycerides: 119 mg/dL (ref <150)
VLDL: 24 mg/dL (ref 0–40)

## 2022-07-08 LAB — PREPARE RBC (CROSSMATCH)

## 2022-07-08 LAB — HEMOGLOBIN AND HEMATOCRIT, BLOOD
HCT: 25.5 % — ABNORMAL LOW (ref 39.0–52.0)
Hemoglobin: 8.5 g/dL — ABNORMAL LOW (ref 13.0–17.0)

## 2022-07-08 LAB — RENAL FUNCTION PANEL
Albumin: 3 g/dL — ABNORMAL LOW (ref 3.5–5.0)
Anion gap: 11 (ref 5–15)
BUN: 27 mg/dL — ABNORMAL HIGH (ref 8–23)
CO2: 22 mmol/L (ref 22–32)
Calcium: 8.7 mg/dL — ABNORMAL LOW (ref 8.9–10.3)
Chloride: 101 mmol/L (ref 98–111)
Creatinine, Ser: 1.04 mg/dL (ref 0.61–1.24)
GFR, Estimated: >60 mL/min (ref >60)
Glucose, Bld: 123 mg/dL — ABNORMAL HIGH (ref 70–99)
Phosphorus: 3.7 mg/dL (ref 2.5–4.6)
Potassium: 4 mmol/L (ref 3.5–5.1)
Sodium: 134 mmol/L — ABNORMAL LOW (ref 135–145)

## 2022-07-08 LAB — MAGNESIUM: Magnesium: 2.2 mg/dL (ref 1.7–2.4)

## 2022-07-08 LAB — HEPARIN LEVEL (UNFRACTIONATED): Heparin Unfractionated: 0.3 IU/mL (ref 0.30–0.70)

## 2022-07-08 LAB — ECHOCARDIOGRAM COMPLETE
Area-P 1/2: 4.8 cm2
Height: 66 in
S' Lateral: 4.4 cm
Weight: 4313.96 oz

## 2022-07-08 LAB — APTT: aPTT: 57 seconds — ABNORMAL HIGH (ref 24–36)

## 2022-07-08 MED ORDER — PEG-KCL-NACL-NASULF-NA ASC-C 100 G PO SOLR
0.5000 | Freq: Once | ORAL | Status: AC
  Administered 2022-07-09: 100 g via ORAL
  Filled 2022-07-08: qty 1

## 2022-07-08 MED ORDER — FUROSEMIDE 10 MG/ML IJ SOLN: 40.0000 mg | Freq: Once | INTRAMUSCULAR | Status: DC

## 2022-07-08 MED ORDER — PEG-KCL-NACL-NASULF-NA ASC-C 100 G PO SOLR: 0.5000 | Freq: Once | ORAL | Status: DC

## 2022-07-08 MED ORDER — PEG-KCL-NACL-NASULF-NA ASC-C 100 G PO SOLR
0.5000 | Freq: Once | ORAL | Status: AC
  Administered 2022-07-10: 100 g via ORAL
  Filled 2022-07-08: qty 1

## 2022-07-08 MED ORDER — PERFLUTREN LIPID MICROSPHERE
1.0000 mL | INTRAVENOUS | Status: AC | PRN
  Administered 2022-07-08: 6 mL via INTRAVENOUS

## 2022-07-08 MED ORDER — SODIUM CHLORIDE 0.9% IV SOLUTION: Freq: Once | INTRAVENOUS | Status: AC

## 2022-07-08 MED ORDER — FUROSEMIDE 10 MG/ML IJ SOLN
40.0000 mg | Freq: Once | INTRAMUSCULAR | Status: AC
  Administered 2022-07-08: 40 mg via INTRAVENOUS
  Filled 2022-07-08: qty 4

## 2022-07-08 MED ORDER — HEPARIN (PORCINE) 25000 UT/250ML-% IV SOLN
1400.0000 [IU]/h | INTRAVENOUS | Status: DC
  Administered 2022-07-08: 1200 [IU]/h via INTRAVENOUS
  Administered 2022-07-09: 1400 [IU]/h via INTRAVENOUS
  Filled 2022-07-08 (×2): qty 250

## 2022-07-08 NOTE — Progress Notes (Signed)
ANTICOAGULATION CONSULT NOTE - Initial Consult  Pharmacy Consult for Heparin Indication: chest pain/ACS and atrial fibrillation  Allergies  Allergen Reactions   Cardizem [Diltiazem] Shortness Of Breath   Pacerone [Amiodarone] Shortness Of Breath, Nausea Only and Other (See Comments)    Abdominal pain Dizziness   Tambocor [Flecainide] Shortness Of Breath    Patient Measurements: Height: 5' 6"$  (167.6 cm) Weight: 122.3 kg (269 lb 10 oz) IBW/kg (Calculated) : 63.8 Heparin Dosing Weight: 89.6  Vital Signs: Temp: 98.3 F (36.8 C) (02/17 1850) Temp Source: Oral (02/17 1850) BP: 137/80 (02/17 1850) Pulse Rate: 88 (02/17 1938)  Labs: Recent Labs    07/06/22 0904 07/06/22 1849 07/07/22 0126 07/07/22 0932 07/07/22 1128 07/08/22 0439 07/08/22 1742 07/08/22 1922  HGB 5.9* 7.8*   < > 8.2*  --  7.9* 8.5*  --   HCT 19.7* 23.4*   < > 24.5*  --  24.3* 25.5*  --   PLT 181 166  --  152  --  159  --   --   APTT  --   --   --   --   --   --   --  57*  HEPARINUNFRC  --   --   --   --   --   --   --  0.30  CREATININE 1.19  --   --  0.85  --  1.04  --   --   TROPONINIHS 796* 3,612*  --  3,438* 4,113*  --   --   --    < > = values in this interval not displayed.     Estimated Creatinine Clearance: 73.4 mL/min (by C-G formula based on SCr of 1.04 mg/dL).   Medical History: Past Medical History:  Diagnosis Date   A-fib [facility])    Abnormal EKG 06/18/2015   Aortic atherosclerosis (HCC)    AV malformation of GI tract    Bradycardia    Carotid artery disease (HCC)    COPD (chronic obstructive pulmonary disease) (HCC)    COPD with exacerbation (HCC)    Diastolic dysfunction    Faintness 06/18/2015   GIB (gastrointestinal bleeding)    Hepatic steatosis    by imaging   Hypertension    LBBB (left bundle branch block) 06/18/2015   Moderate aortic stenosis    NSVT (nonsustained ventricular tachycardia) (HCC)    Obesity    OSA on CPAP    PVC's (premature ventricular contractions)     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 (LAFB)    Syncope 08/06/2015   Tobacco use    V-tach ([place]) 08/06/2015    Medications:  Medications Prior to Admission  Medication Sig Dispense Refill Last Dose   acetaminophen (TYLENOL) 650 MG CR tablet Take 1,300 mg by mouth 2 (two) times daily as needed for pain.   Past Week   albuterol (PROVENTIL) (2.5 MG/3ML) 0.083% nebulizer solution Take 3 mLs (2.5 mg total) by nebulization every 4 (four) hours as needed for wheezing or shortness of breath. 75 mL 12 Past Week   albuterol (VENTOLIN HFA) 108 (90 Base) MCG/ACT inhaler Inhale 2 puffs into the lungs every 6 (six) hours as needed for wheezing or shortness of breath. 8 g 6 07/05/2022   cetirizine (ZYRTEC) 10 MG tablet Take 10 mg by mouth daily.   07/05/2022   famotidine (PEPCID) 20 MG tablet Take 20 mg by mouth at bedtime.    07/05/2022   Ferrous Sulfate (IRON) 325 (65 Fe)  MG TABS Take 325 mg by mouth daily.   Past Week   fluticasone-salmeterol (ADVAIR DISKUS) 250-50 MCG/ACT AEPB Inhale 1 puff into the lungs in the morning and at bedtime. 60 each 5 07/05/2022   furosemide (LASIX) 40 MG tablet Take 1.5 tablets (60 mg total) by mouth 2 (two) times daily. 270 tablet 1 07/05/2022   irbesartan (AVAPRO) 300 MG tablet TAKE 1 TABLET(300 MG) BY MOUTH DAILY (Patient taking differently: Take 300 mg by mouth every evening.) 90 tablet 3 07/05/2022   latanoprost (XALATAN) 0.005 % ophthalmic solution Place 1 drop into both eyes at bedtime.   Past Week   pantoprazole (PROTONIX) 40 MG tablet Take 1 tablet (40 mg total) by mouth daily. 30 tablet 1 07/05/2022   Polyethyl Glycol-Propyl Glycol (SYSTANE OP) Place 1 drop into both eyes daily as needed (dry eyes).   Past Week   rosuvastatin (CRESTOR) 20 MG tablet Take 20 mg by mouth daily.   07/05/2022   spironolactone (ALDACTONE) 25 MG tablet Take 1 tablet (25 mg total) by mouth daily. 90 tablet 3 07/05/2022   XARELTO 20 MG TABS tablet TAKE 1 TABLET BY MOUTH  EVERY DAY WITH DINNER (Patient taking differently: Take 20 mg by mouth daily with supper.) 90 tablet 1 07/05/2022 at 1700    Assessment: Derrick Alexander is a 77yo M presenting to the ED with SOB and melena. PMH significant for afib on Xarelto PTA (LD 2/14). Xarelto was held on admission due to GIB. Troponins elevated at presentation, cardiology evaluated for NSTEMI but determined the most likely etiology to be demand ischemia. EGD performed on 2/16 and patient was cleared to re-initiate anticoagulation per GI. Pharmacy has been consulted for heparin dosing for afib and ACS.   Hgb 5.9 on admission, has improved but remains low at 7.9, plts wnl  aPTT 57, HL 0.3. Not correlating yet. Increase rate and check in AM.   Goal of Therapy:  Heparin level 0.3-0.7 units/ml aPTT 66-102 seconds Monitor platelets by anticoagulation protocol: Yes   Plan:  - No bolus due to recent bleeding - Increase heparin infusion to 1400 units/hr - Follow AM aPTTuntil correlating with heparin level  - Monitor CBC, s/s of bleeding daily  Onnie Boer, PharmD, [place], AAHIVP, CPP Infectious Disease Pharmacist 07/08/2022 8:19 PM

## 2022-07-08 NOTE — Progress Notes (Signed)
PROGRESS NOTE  Derrick Alexander [id number] DOB: 1946-01-17   PCP: [name], MD  Patient is from: Home.  Lives with family.  DOA: 07/06/2022 LOS: 2  Chief complaints Chief Complaint  Patient presents with   Shortness of Breath     Brief Narrative / Interim history: 77 year old M with PMH of A-fib on Xarelto, GIB/gastric AVMs, OSA on CPAP, diastolic CHF, COPD, morbid obesity, HTN and HLD presenting with melena for about a week, and admitted for symptomatic anemia due to acute upper GI bleed, elevated troponin and some respiratory distress.  Patient was hospitalized 04/27/2022-04/30/2022 for acute upper GI bleed with gastric AVM that was treated with APC.  He reported vague chest pain.  Troponin 796 and trended up to 3612.  EKG with some ST depressions in lateral leads which appears to be new.  2 units of blood ordered.  GI consulted and patient was admitted.   The next day, cardiology consulted.  EGD done and showed erythematous gastric mucosa.  GI recommended resuming regular diet, restarting Eliquis, and colonoscopy if bleeding recurs.   Subjective: Seen and examined earlier this morning.  No major events overnight of this morning.  No further bleeding or bowel movement but has not eaten either.  He does not have good appetite.  He denies chest pain.  He feels his breathing is labored.  Patient's wife and daughters at bedside.  Objective: Vitals:   07/08/22 0937 07/08/22 1017 07/08/22 1043 07/08/22 1246  BP:  128/63 139/82 122/83  Pulse:  95 89 88  Resp:  (!) 22 (!) 26 (!) 23  Temp:  97.9 F (36.6 C) 98.7 F (37.1 C) 98 F (36.7 C)  TempSrc:  Oral Oral Oral  SpO2: 98% 99% 99% 96%  Weight:      Height:        Examination:  GENERAL: No apparent distress.  Nontoxic. HEENT: MMM.  Vision and hearing grossly intact.  NECK: Supple.  Difficult to assess JVD. RESP:  No IWOB.  Fair aeration bilaterally but limited exam due to body habitus. CVS:  RRR. Heart sounds normal.   ABD/GI/GU: BS+. Abd soft, NTND.  MSK/EXT:   No apparent deformity. Moves extremities. No edema.  SKIN: no apparent skin lesion or wound NEURO: Awake and alert. Oriented appropriately.  No apparent focal neuro deficit. PSYCH: Calm. Normal affect.   Procedures:  2/16-EGD with erythematous gastric mucosa.  Microbiology summarized: [id number], influenza and RSV PCR nonreactive.  Assessment and plan: Principal Problem:   Acute GI bleeding Active Problems:   Chronic obstructive pulmonary disease (HCC)   Paroxysmal atrial fibrillation with RVR (HCC)   Morbid obesity with BMI of 40.0-44.9, adult (HCC)   Essential hypertension   Paroxysmal atrial fibrillation (HCC)   OSA on CPAP   Anemia due to gastrointestinal blood loss   Glaucoma (increased eye pressure)   Symptomatic anemia   Chronic diastolic CHF (congestive heart failure) (HCC)   Elevated troponin   Abnormal EKG   Chronic obstructive pulmonary disease (COPD) (HCC)   Anticoagulated  Acute on chronic blood loss anemia due to upper GI bleed: Patient with history of gastric AVMs that was treated with APC in 04/2022.  He is on Xarelto for atrial fibrillation.  Transfuse 3 units so far.  Hgb improved.  EGD as above. Recent Labs    04/28/22 1811 04/29/22 0500 04/29/22 2156 04/30/22 0535 05/05/22 0956 07/06/22 0904 07/06/22 1849 07/07/22 0126 07/07/22 0932 07/08/22 0439  HGB 8.0* 8.4* 8.4* 7.9* 7.8* 5.9* 7.8* 6.7* 8.2*  7.9*  -Continue IV Protonix -Maintain 2 PIV -Monitor H&H and transfuse for Hgb < 8.  Transfusing 1 unit today -Plan for colonoscopy on Monday  Respiratory distress: Likely due to the above.  He also has history of COPD but without exacerbation.  Difficult to assess fluid status due to body habitus.  Acutely ill with normal LVEF but G2 DD and moderate AS -Monitor anemia as above -Nebulizers as needed -Continue home inhalers  Chronic diastolic CHF: TTE with LVEF of 60 to 65%, G2-DD and mod AS. BNP slightly up  likely in the setting of anemia, IV fluid and blood transfusion.  Feels his breathing is labored.  Grossly euvolemic but difficult exam due to body habitus. -Cardiology following.  Received IV Lasix today -Strict intake and output, daily weights and renal functions.   Elevated troponin/abnormal EKG: Troponin 800>> 3600>> 4100>>.  EKG with some ST depression in lateral leads.  Reported vague chest pain on presentation.  Currently chest pain-free but dyspnea.  TTE as above. -Okay to restart anticoagulation per GI.  Started IV heparin without bolus -Transfuse 1 more unit to keep Hgb above 8 -Cardiology following.   Paroxysmal atrial fibrillation: Currently in sinus rhythm with PVCs.  Does not seem to be reasonable rate control meds.  CHA2DS2-VASc score > 4. -Anticoagulation as above.   Chronic COPD -Continue on inhalers -Continue Xopenex as needed  Essential hypertension: Normotensive off home antihypertensive meds. -Hold irbesartan, spironolactone for now given marginal BP in the setting of GI bleeding  OSA reportedly compliant with CPAP at home. -Encouraged to use CPAP instead of oxygen.  HLD -Continue rosuvastatin   Glaucoma -Continue latanoprost   Morbid obesity Body mass index is 43.52 kg/m.           DVT prophylaxis:  SCDs Start: 07/06/22 1310  Code Status: Full code Family Communication: Updated patient's wife and daughter at bedside Level of care: Progressive Status is: Inpatient Remains inpatient appropriate because: Blood loss anemia and elevated troponin   Final disposition: TBD Consultants:  Neurology Cardiology   55 minutes with more than 50% spent in reviewing records, counseling patient/family and coordinating care.   Sch Meds:  Scheduled Meds:  latanoprost  1 drop Both Eyes QHS   metoprolol tartrate  12.5 mg Oral BID   mometasone-formoterol  2 puff Inhalation BID   pantoprazole (PROTONIX) IV  40 mg Intravenous Q12H   [START ON 07/09/2022] peg  3350 powder  0.5 kit Oral Once   And   [START ON 07/10/2022] peg 3350 powder  0.5 kit Oral Once   rosuvastatin  20 mg Oral Daily   sodium chloride flush  3 mL Intravenous Q12H   Continuous Infusions:  heparin 1,200 Units/hr (07/08/22 1202)   PRN Meds:.acetaminophen **OR** acetaminophen, albuterol, hydrALAZINE, morphine injection, polyvinyl alcohol  Antimicrobials: Anti-infectives (From admission, onward)    None        I have personally reviewed the following labs and images: CBC: Recent Labs  Lab 07/06/22 0904 07/06/22 1849 07/07/22 0126 07/07/22 0932 07/08/22 0439  WBC 13.6* 17.0*  --  15.5* 16.0*  NEUTROABS  --   --   --   --  13.1*  HGB 5.9* 7.8* 6.7* 8.2* 7.9*  HCT 19.7* 23.4* 21.1* 24.5* 24.3*  MCV 96.1 90.7  --  91.1 91.7  PLT 181 166  --  152 159   BMP &GFR Recent Labs  Lab 07/06/22 0904 07/07/22 0932 07/08/22 0439  NA 135 135 134*  K 4.8 4.1 4.0  CL 104 106 101  CO2 19* 22 22  GLUCOSE 139* 142* 123*  BUN 36* 28* 27*  CREATININE 1.19 0.85 1.04  CALCIUM 8.5* 8.5* 8.7*  MG  --   --  2.2  PHOS  --   --  3.7   Estimated Creatinine Clearance: 73.4 mL/min (by C-G formula based on SCr of 1.04 mg/dL). Liver & Pancreas: Recent Labs  Lab 07/06/22 0904 07/08/22 0439  AST 38  --   ALT 12  --   ALKPHOS 39  --   BILITOT 0.6  --   PROT 5.0*  --   ALBUMIN 3.1* 3.0*   No results for input(s): "LIPASE", "AMYLASE" in the last 168 hours. No results for input(s): "AMMONIA" in the last 168 hours. Diabetic: No results for input(s): "HGBA1C" in the last 72 hours. No results for input(s): "GLUCAP" in the last 168 hours. Cardiac Enzymes: No results for input(s): "CKTOTAL", "CKMB", "CKMBINDEX", "TROPONINI" in the last 168 hours. Recent Labs    05/05/22 0956  PROBNP 756*   Coagulation Profile: No results for input(s): "INR", "PROTIME" in the last 168 hours. Thyroid Function Tests: No results for input(s): "TSH", "T4TOTAL", "FREET4", "T3FREE", "THYROIDAB" in  the last 72 hours. Lipid Profile: Recent Labs    07/08/22 0439  CHOL 77  HDL 28*  LDLCALC 25  TRIG 119  CHOLHDL 2.8   Anemia Panel: No results for input(s): "VITAMINB12", "FOLATE", "FERRITIN", "TIBC", "IRON", "RETICCTPCT" in the last 72 hours. Urine analysis: No results found for: "COLORURINE", "APPEARANCEUR", "LABSPEC", "PHURINE", "GLUCOSEU", "HGBUR", "BILIRUBINUR", "KETONESUR", "PROTEINUR", "UROBILINOGEN", "NITRITE", "LEUKOCYTESUR" Sepsis Labs: Invalid input(s): "PROCALCITONIN", "LACTICIDVEN"  Microbiology: Recent Results (from the past 240 hour(s))  Resp panel by RT-PCR (RSV, Flu A&B, Covid) Anterior Nasal Swab     Status: None   Collection Time: 07/06/22  8:08 AM   Specimen: Anterior Nasal Swab  Result Value Ref Range Status   SARS Coronavirus 2 by RT PCR NEGATIVE NEGATIVE Final   Influenza A by PCR NEGATIVE NEGATIVE Final   Influenza B by PCR NEGATIVE NEGATIVE Final    Comment: (NOTE) The Xpert Xpress SARS-CoV-2/FLU/RSV plus assay is intended as an aid in the diagnosis of influenza from Nasopharyngeal swab specimens and should not be used as a sole basis for treatment. Nasal washings and aspirates are unacceptable for Xpert Xpress SARS-CoV-2/FLU/RSV testing.  Fact Sheet for Patients: EntrepreneurPulse.com.au  Fact Sheet for Healthcare Providers: IncredibleEmployment.be  This test is not yet approved or cleared by the [place] FDA and has been authorized for detection and/or diagnosis of SARS-CoV-2 by FDA under an Emergency Use Authorization (EUA). This EUA will remain in effect (meaning this test can be used) for the duration of the COVID-19 declaration under Section 564(b)(1) of the Act, 21 U.S.C. section 360bbb-3(b)(1), unless the authorization is terminated or revoked.     Resp Syncytial Virus by PCR NEGATIVE NEGATIVE Final    Comment: (NOTE) Fact Sheet for Patients: EntrepreneurPulse.com.au  Fact  Sheet for Healthcare Providers: IncredibleEmployment.be  This test is not yet approved or cleared by the [place] FDA and has been authorized for detection and/or diagnosis of SARS-CoV-2 by FDA under an Emergency Use Authorization (EUA). This EUA will remain in effect (meaning this test can be used) for the duration of the COVID-19 declaration under Section 564(b)(1) of the Act, 21 U.S.C. section 360bbb-3(b)(1), unless the authorization is terminated or revoked.  Performed at [facility], [place] [address], [place], [place] [address]     Radiology Studies: ECHOCARDIOGRAM  COMPLETE  Result Date: 07/08/2022    ECHOCARDIOGRAM REPORT   Patient Name:   Derrick Alexander Date of Exam: 07/08/2022 Medical Rec #:  [id number]    Height:       66.0 in Accession #:    [id number]   Weight:       269.6 lb Date of Birth:  October 22, 1945    BSA:          2.270 m Patient Age:    43 years     BP:           124/62 mmHg Patient Gender: M            HR:           89 bpm. Exam Location:  Inpatient Procedure: 2D Echo, Cardiac Doppler, Color Doppler and Intracardiac            Opacification Agent Indications:    Atrial Fibrillation I48.91                 NSTEMI I21.4  History:        Patient has prior history of Echocardiogram examinations, most                 recent 03/27/2022. CHF, Arrythmias:LBBB, Tachycardia, Atrial                 Fibrillation and PVC, Signs/Symptoms:Syncope, Dyspnea and Chest                 Pain; Risk Factors:Hypertension, Sleep Apnea and Current Smoker.  Sonographer:    Ronny Flurry Referring Phys: [address] [place]  1. Left ventricular ejection fraction, by estimation, is 60 to 65%. The left ventricle has normal function. The left ventricle has no regional wall motion abnormalities. There is moderate left ventricular hypertrophy. Left ventricular diastolic parameters are consistent with Grade II diastolic dysfunction (pseudonormalization). Elevated left ventricular  end-diastolic pressure.  2. Right ventricular systolic function is normal. The right ventricular size is normal.  3. No color flow done on atrial septum in sub costal views.  4. The mitral valve is abnormal. No evidence of mitral valve regurgitation. No evidence of mitral stenosis.  5. CW doppler of AV not adequate prior echo 03/27/22 with moderate AS mean gradient 26.4 mmhg peak 52.5 mmhg AVA 1.1 cm2 and DVI 0.34 would have tech return for better attempt at gradients Likely still moderate AS CHAT message sent to tech in Epic. The aortic valve is tricuspid. Aortic valve regurgitation is not visualized. See comments.  6. The inferior vena cava is dilated in size with >50% respiratory variability, suggesting right atrial pressure of 8 mmHg. FINDINGS  Left Ventricle: Left ventricular ejection fraction, by estimation, is 60 to 65%. The left ventricle has normal function. The left ventricle has no regional wall motion abnormalities. Definity contrast agent was given IV to delineate the left ventricular  endocardial borders. The left ventricular internal cavity size was normal in size. There is moderate left ventricular hypertrophy. Left ventricular diastolic parameters are consistent with Grade II diastolic dysfunction (pseudonormalization). Elevated left ventricular end-diastolic pressure. Right Ventricle: The right ventricular size is normal. No increase in right ventricular wall thickness. Right ventricular systolic function is normal. Left Atrium: Left atrial size was not well visualized. Right Atrium: Right atrial size was normal in size. Pericardium: There is no evidence of pericardial effusion. Mitral Valve: The mitral valve is abnormal. There is mild thickening of the mitral valve leaflet(s). There is mild calcification of the  mitral valve leaflet(s). No evidence of mitral valve regurgitation. No evidence of mitral valve stenosis. Tricuspid Valve: The tricuspid valve is normal in structure. Tricuspid valve  regurgitation is not demonstrated. No evidence of tricuspid stenosis. Aortic Valve: CW doppler of AV not adequate prior echo 03/27/22 with moderate AS mean gradient 26.4 mmhg peak 52.5 mmhg AVA 1.1 cm2 and DVI 0.34 would have tech return for better attempt at gradients Likely still moderate AS CHAT message sent to tech in Epic. The aortic valve is tricuspid. Aortic valve regurgitation is not visualized. See comments. Pulmonic Valve: The pulmonic valve was normal in structure. Pulmonic valve regurgitation is not visualized. No evidence of pulmonic stenosis. Aorta: The aortic root is normal in size and structure. Venous: The inferior vena cava is dilated in size with greater than 50% respiratory variability, suggesting right atrial pressure of 8 mmHg. IAS/Shunts: No atrial level shunt detected by color flow Doppler.  LEFT VENTRICLE PLAX 2D LVIDd:         6.20 cm   Diastology LVIDs:         4.40 cm   LV e' medial:    7.89 cm/s LV PW:         1.30 cm   LV E/e' medial:  20.2 LV IVS:        1.60 cm   LV e' lateral:   9.90 cm/s LVOT diam:     2.30 cm   LV E/e' lateral: 16.1 LV SV:         80 LV SV Index:   35 LVOT Area:     4.15 cm  RIGHT VENTRICLE RV S prime:     15.70 cm/s TAPSE (M-mode): 2.7 cm LEFT ATRIUM         Index LA diam:    3.80 cm 1.67 cm/m  AORTIC VALVE LVOT Vmax:   130.67 cm/s LVOT Vmean:  83.800 cm/s LVOT VTI:    0.193 m  AORTA Ao Root diam: 3.10 cm Ao Asc diam:  3.60 cm MITRAL VALVE MV Area (PHT): 4.80 cm     SHUNTS MV Decel Time: 158 msec     Systemic VTI:  0.19 m MV E velocity: 159.00 cm/s  Systemic Diam: 2.30 cm MV A velocity: 89.40 cm/s MV E/A ratio:  1.78 Jenkins Rouge MD Electronically signed by Jenkins Rouge MD Signature Date/Time: 07/08/2022/11:58:52 AM    Final       Azir Muzyka T. [place]  If 7PM-7AM, please contact night-coverage [email or web address] 07/08/2022, 1:52 PM

## 2022-07-08 NOTE — Progress Notes (Signed)
Progress Note   Subjective  Patient denies any BMs overnight. Not in any pain. Echocardiogram done today.  Received unit of blood today for downtrending hemoglobin of 7.9.   Objective   Vital signs in last 24 hours: Temp:  [97.3 F (36.3 C)-98.7 F (37.1 C)] 98.7 F (37.1 C) (02/17 1043) Pulse Rate:  [62-177] 89 (02/17 1043) Resp:  [15-26] 26 (02/17 1043) BP: (91-139)/(40-89) 139/82 (02/17 1043) SpO2:  [88 %-100 %] 99 % (02/17 1043) Weight:  [122.3 kg] 122.3 kg (02/17 0412) Last BM Date : 07/06/22 General:    white male in NAD Neurologic:  Alert and oriented,  grossly normal neurologically. Psych:  Cooperative. Normal mood and affect.  Intake/Output from previous day: 02/16 0701 - 02/17 0700 In: 303 [I.V.:303] Out: 880 [Urine:880] Intake/Output this shift: Total I/O In: -  Out: 535 [Urine:535]  Lab Results: Recent Labs    07/06/22 1849 07/07/22 0126 07/07/22 0932 07/08/22 0439  WBC 17.0*  --  15.5* 16.0*  HGB 7.8* 6.7* 8.2* 7.9*  HCT 23.4* 21.1* 24.5* 24.3*  PLT 166  --  152 159   BMET Recent Labs    07/06/22 0904 07/07/22 0932 07/08/22 0439  NA 135 135 134*  K 4.8 4.1 4.0  CL 104 106 101  CO2 19* 22 22  GLUCOSE 139* 142* 123*  BUN 36* 28* 27*  CREATININE 1.19 0.85 1.04  CALCIUM 8.5* 8.5* 8.7*   LFT Recent Labs    07/06/22 0904 07/08/22 0439  PROT 5.0*  --   ALBUMIN 3.1* 3.0*  AST 38  --   ALT 12  --   ALKPHOS 39  --   BILITOT 0.6  --    PT/INR No results for input(s): "LABPROT", "INR" in the last 72 hours.  Studies/Results: No results found.     Assessment / Plan:    77 y/o male here with the following:  GI bleeding - unclear source Anemia PAF on Anticoagulation CHF Elevated troponin  EGD yesterday with some small erythematous areas without any obvious stigmata for bleeding, they were ablated.  He has not had any bowel movements since yesterday, hemoglobin has slightly drifted and given a unit of blood transfusion.  His  BUN is elevated suggesting small bowel source of bleeding although he has not had a colonoscopy since October 2020, had some polyps removed.  Discussed the situation with the patient, his wife, and daughters were in the room.  He does warrant anticoagulation given his cardiac history, however hesitant to resume oral anticoagulant until we can clarify his bleeding source.  In this light I think colonoscopy is reasonable to clear his colon, and if negative, place capsule to perform small bowel evaluation.  I discussed risk and benefits of the colonoscopy, anesthesia, and capsule if needed.  He does wish to proceed with this prior to discharge.  The question is timing, I am not sure if we have the capacity to do colonoscopy tomorrow, and he only wants to bowel prep once.  I have discussed with the endoscopy unit, I have scheduled him for colonoscopy on Monday afternoon with Dr. Benson Norway since I cannot guarantee him a spot to have it done tomorrow.  Otherwise, monitor hemoglobin, he has been placed on heparin drip and monitor for recurrent bleeding.  RECOMMEND: - continue clear liquid diet until colonoscopy done - plan on colonoscopy likely Monday afternoon. Will need prep tomorrow evening. If colonoscopy negative, will need capsule endoscopy to clear small bowel -  placed on heparin drip today, will monitor for recurrent bleeding. Holding oral anticoagulation for now. If he does have overt bleeding please let us know and hold heparin. Will need this held 4-5 hours prior to colonoscopy  Call with questions.  Jolly Mango, MD [facility] Gastroenterology

## 2022-07-08 NOTE — Progress Notes (Signed)
Rounding Note    Patient Name: Derrick Alexander Date of Encounter: 07/08/2022  [place] Cardiologist: Pixie Casino, MD   Subjective   No CP; mild dyspnea  Inpatient Medications    Scheduled Meds:  latanoprost  1 drop Both Eyes QHS   metoprolol tartrate  12.5 mg Oral BID   mometasone-formoterol  2 puff Inhalation BID   pantoprazole (PROTONIX) IV  40 mg Intravenous Q12H   rosuvastatin  20 mg Oral Daily   sodium chloride flush  3 mL Intravenous Q12H   Continuous Infusions:  PRN Meds: acetaminophen **OR** acetaminophen, albuterol, hydrALAZINE, morphine injection, polyvinyl alcohol   Vital Signs    Vitals:   07/07/22 2341 07/08/22 0409 07/08/22 0412 07/08/22 0600  BP: (!) 123/40 (!) 98/59  127/76  Pulse: 93 91  90  Resp: (!) 22 (!) 25  (!) 24  Temp: 98.2 F (36.8 C) 97.7 F (36.5 C)    TempSrc: Oral Oral    SpO2: (!) 88% 95%  93%  Weight:   122.3 kg   Height:        Intake/Output Summary (Last 24 hours) at 07/08/2022 0744 Last data filed at 07/07/2022 2200 Gross per 24 hour  Intake 303 ml  Output 880 ml  Net -577 ml      07/08/2022    4:12 AM 07/06/2022    8:11 AM 06/02/2022   10:34 AM  Last 3 Weights  Weight (lbs) 269 lb 10 oz 248 lb 0.3 oz 248 lb  Weight (kg) 122.3 kg 112.5 kg 112.492 kg      Telemetry    Atrial fibrillation converted back to sinus - Personally Reviewed   Physical Exam   GEN: Mildly dyspneic Neck: supple Cardiac: RRR, 2/6 systolic murmur Respiratory: Mildly diminished BS GI: Soft, nontender, non-distended  MS: No edema Neuro:  Nonfocal  Psych: Normal affect   Labs    High Sensitivity Troponin:   Recent Labs  Lab 07/06/22 0904 07/06/22 1849 07/07/22 0932 07/07/22 1128  TROPONINIHS 796* 3,612* 3,438* 4,113*     Chemistry Recent Labs  Lab 07/06/22 0904 07/07/22 0932 07/08/22 0439  NA 135 135 134*  K 4.8 4.1 4.0  CL 104 106 101  CO2 19* 22 22  GLUCOSE 139* 142* 123*  BUN 36* 28* 27*  CREATININE  1.19 0.85 1.04  CALCIUM 8.5* 8.5* 8.7*  MG  --   --  2.2  PROT 5.0*  --   --   ALBUMIN 3.1*  --  3.0*  AST 38  --   --   ALT 12  --   --   ALKPHOS 39  --   --   BILITOT 0.6  --   --   GFRNONAA >60 >60 >60  ANIONGAP 12 7 11    $ Lipids  Recent Labs  Lab 07/08/22 0439  CHOL 77  TRIG 119  HDL 28*  LDLCALC 25  CHOLHDL 2.8    Hematology Recent Labs  Lab 07/06/22 1849 07/07/22 0126 07/07/22 0932 07/08/22 0439  WBC 17.0*  --  15.5* 16.0*  RBC 2.58*  --  2.69* 2.65*  HGB 7.8* 6.7* 8.2* 7.9*  HCT 23.4* 21.1* 24.5* 24.3*  MCV 90.7  --  91.1 91.7  MCH 30.2  --  30.5 29.8  MCHC 33.3  --  33.5 32.5  RDW 17.7*  --  17.1* 17.3*  PLT 166  --  152 159    BNP Recent Labs  Lab 07/06/22 0904 07/07/22 0932  BNP 266.1* 522.0*      Radiology    DG Chest Port 1 View  Result Date: 07/06/2022 CLINICAL DATA:  Shortness of breath EXAM: PORTABLE CHEST 1 VIEW COMPARISON:  04/29/2022 FINDINGS: Lungs are clear.  No pleural effusion or pneumothorax. The heart is normal in size. IMPRESSION: No evidence of acute cardiopulmonary disease. Electronically Signed   By: Julian Hy M.D.   On: 07/06/2022 08:39      Patient Profile     77 year old male with past medical history paroxysmal atrial fibrillation status post ablation April 2022, moderate aortic stenosis, diastolic dysfunction, GI bleed secondary to gastric AVMs, COPD, hypertension, tobacco abuse, obstructive sleep apnea for evaluation of elevated troponin, recurrent atrial fibrillation and acute on chronic diastolic congestive heart failure. Last echocardiogram November 2023 showed normal LV function, moderate left ventricular hypertrophy, grade 2 diastolic dysfunction, moderate aortic stenosis with mean gradient 26 mmHg and aortic valve area 1.06 cm. Patient had GI bleed December 2023 requiring 3 units packed red blood cells. Patient had EGD revealing gastric AVMs which was treated. Xarelto was ultimately resumed. Over the preceding  week he noted progressive weakness with activities as well as dyspnea on exertion. He presented for further evaluation and his hemoglobin was noted to be 5.9 and he was admitted. He has been transfused 3 units packed red blood cells. He also developed recurrent atrial fibrillation and troponin noted to be elevated. Cardiology now asked to evaluate.   Assessment & Plan    1 elevated troponin-as outlined previously patient has not had chest pain and there are no diagnostic ST changes on his electrocardiogram.  This is felt likely secondary to demand ischemia secondary to anemia from GI bleed.  There may also be a contribution from diastolic congestive heart failure and recurrent atrial fibrillation.  No plans for ischemia evaluation at this point (he is actively bleeding and would not be a candidate for intervention).     2 acute on chronic diastolic congestive heart failure-patient remains mildly dyspneic this morning.  Will give Lasix 40 mg IV x 1.  His dyspnea is likely secondary to anemia, volume excess related to transfusion and moderate aortic stenosis.  Await follow-up echocardiogram.     3 recurrent atrial fibrillation-patient has converted back to sinus rhythm.  Continue low-dose metoprolol.  No anticoagulation in the setting of GI bleed.  Ultimately he should be considered for watchman as an outpatient.     4 aortic stenosis-moderate on most recent echocardiogram.  Await follow-up images.   5 GI bleed-evaluation per gastroenterology.  Given this is his second bleed colonoscopy likely indicated.  Follow hemoglobin and transfuse as needed.  Managed by primary care.  For questions or updates, please contact [place] Please consult [email or web address] for contact info under        Signed, Kirk Ruths, MD  07/08/2022, 7:44 AM

## 2022-07-08 NOTE — Progress Notes (Signed)
Echocardiogram 2D Echocardiogram has been performed.  Derrick Alexander 07/08/2022, 11:26 AM

## 2022-07-08 NOTE — Progress Notes (Signed)
ANTICOAGULATION CONSULT NOTE - Initial Consult  Pharmacy Consult for Heparin Indication: chest pain/ACS and atrial fibrillation  Allergies  Allergen Reactions   Cardizem [Diltiazem] Shortness Of Breath   Pacerone [Amiodarone] Shortness Of Breath, Nausea Only and Other (See Comments)    Abdominal pain Dizziness   Tambocor [Flecainide] Shortness Of Breath    Patient Measurements: Height: 5' 6"$  (167.6 cm) Weight: 122.3 kg (269 lb 10 oz) IBW/kg (Calculated) : 63.8 Heparin Dosing Weight: 89.6  Vital Signs: Temp: 97.6 F (36.4 C) (02/17 0759) Temp Source: Oral (02/17 0759) BP: 124/62 (02/17 0759) Pulse Rate: 93 (02/17 0759)  Labs: Recent Labs    07/06/22 0904 07/06/22 1849 07/07/22 0126 07/07/22 0932 07/07/22 1128 07/08/22 0439  HGB 5.9* 7.8* 6.7* 8.2*  --  7.9*  HCT 19.7* 23.4* 21.1* 24.5*  --  24.3*  PLT 181 166  --  152  --  159  CREATININE 1.19  --   --  0.85  --  1.04  TROPONINIHS 796* 3,612*  --  3,438* 4,113*  --     Estimated Creatinine Clearance: 73.4 mL/min (by C-G formula based on SCr of 1.04 mg/dL).   Medical History: Past Medical History:  Diagnosis Date   A-fib [facility])    Abnormal EKG 06/18/2015   Aortic atherosclerosis (HCC)    AV malformation of GI tract    Bradycardia    Carotid artery disease (HCC)    COPD (chronic obstructive pulmonary disease) (HCC)    COPD with exacerbation (HCC)    Diastolic dysfunction    Faintness 06/18/2015   GIB (gastrointestinal bleeding)    Hepatic steatosis    by imaging   Hypertension    LBBB (left bundle branch block) 06/18/2015   Moderate aortic stenosis    NSVT (nonsustained ventricular tachycardia) (HCC)    Obesity    OSA on CPAP    PVC's (premature ventricular contractions)    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 (LAFB)    Syncope 08/06/2015   Tobacco use    V-tach ([place]) 08/06/2015    Medications:  Medications Prior to Admission  Medication Sig Dispense Refill Last  Dose   acetaminophen (TYLENOL) 650 MG CR tablet Take 1,300 mg by mouth 2 (two) times daily as needed for pain.   Past Week   albuterol (PROVENTIL) (2.5 MG/3ML) 0.083% nebulizer solution Take 3 mLs (2.5 mg total) by nebulization every 4 (four) hours as needed for wheezing or shortness of breath. 75 mL 12 Past Week   albuterol (VENTOLIN HFA) 108 (90 Base) MCG/ACT inhaler Inhale 2 puffs into the lungs every 6 (six) hours as needed for wheezing or shortness of breath. 8 g 6 07/05/2022   cetirizine (ZYRTEC) 10 MG tablet Take 10 mg by mouth daily.   07/05/2022   famotidine (PEPCID) 20 MG tablet Take 20 mg by mouth at bedtime.    07/05/2022   Ferrous Sulfate (IRON) 325 (65 Fe) MG TABS Take 325 mg by mouth daily.   Past Week   fluticasone-salmeterol (ADVAIR DISKUS) 250-50 MCG/ACT AEPB Inhale 1 puff into the lungs in the morning and at bedtime. 60 each 5 07/05/2022   furosemide (LASIX) 40 MG tablet Take 1.5 tablets (60 mg total) by mouth 2 (two) times daily. 270 tablet 1 07/05/2022   irbesartan (AVAPRO) 300 MG tablet TAKE 1 TABLET(300 MG) BY MOUTH DAILY (Patient taking differently: Take 300 mg by mouth every evening.) 90 tablet 3 07/05/2022   latanoprost (XALATAN) 0.005 % ophthalmic solution Place  1 drop into both eyes at bedtime.   Past Week   pantoprazole (PROTONIX) 40 MG tablet Take 1 tablet (40 mg total) by mouth daily. 30 tablet 1 07/05/2022   Polyethyl Glycol-Propyl Glycol (SYSTANE OP) Place 1 drop into both eyes daily as needed (dry eyes).   Past Week   rosuvastatin (CRESTOR) 20 MG tablet Take 20 mg by mouth daily.   07/05/2022   spironolactone (ALDACTONE) 25 MG tablet Take 1 tablet (25 mg total) by mouth daily. 90 tablet 3 07/05/2022   XARELTO 20 MG TABS tablet TAKE 1 TABLET BY MOUTH EVERY DAY WITH DINNER (Patient taking differently: Take 20 mg by mouth daily with supper.) 90 tablet 1 07/05/2022 at 1700    Assessment: Cj Gorey is a 77yo M presenting to the ED with SOB and melena. PMH significant for afib  on Xarelto PTA (LD 2/14). Xarelto was held on admission due to GIB. Troponins elevated at presentation, cardiology evaluated for NSTEMI but determined the most likely etiology to be demand ischemia. EGD performed on 2/16 and patient was cleared to re-initiate anticoagulation per GI. Pharmacy has been consulted for heparin dosing for afib and ACS.   Hgb 5.9 on admission, has improved but remains low at 7.9, plts wnl  Goal of Therapy:  Heparin level 0.3-0.7 units/ml aPTT 66-102 seconds Monitor platelets by anticoagulation protocol: Yes   Plan:  - No bolus due to recent bleeding - Start heparin infusion at 1200 units/hr - Follow aPTTuntil correlating with heparin level  - Collect heparin level and aPTT 8h after start of infusion - Monitor CBC, s/s of bleeding daily  Titus Dubin, PharmD PGY1 Pharmacy Resident 07/08/2022 10:28 AM

## 2022-07-09 ENCOUNTER — Encounter (HOSPITAL_COMMUNITY): Payer: Self-pay | Admitting: Gastroenterology

## 2022-07-09 LAB — RENAL FUNCTION PANEL
Albumin: 3 g/dL — ABNORMAL LOW (ref 3.5–5.0)
Anion gap: 12 (ref 5–15)
BUN: 26 mg/dL — ABNORMAL HIGH (ref 8–23)
CO2: 22 mmol/L (ref 22–32)
Calcium: 8.6 mg/dL — ABNORMAL LOW (ref 8.9–10.3)
Chloride: 98 mmol/L (ref 98–111)
Creatinine, Ser: 1.06 mg/dL (ref 0.61–1.24)
GFR, Estimated: >60 mL/min (ref >60)
Glucose, Bld: 116 mg/dL — ABNORMAL HIGH (ref 70–99)
Phosphorus: 3.1 mg/dL (ref 2.5–4.6)
Potassium: 3.7 mmol/L (ref 3.5–5.1)
Sodium: 132 mmol/L — ABNORMAL LOW (ref 135–145)

## 2022-07-09 LAB — HEPARIN LEVEL (UNFRACTIONATED)
Heparin Unfractionated: 0.31 IU/mL (ref 0.30–0.70)
Heparin Unfractionated: 0.31 IU/mL (ref 0.30–0.70)

## 2022-07-09 LAB — BPAM RBC
Blood Product Expiration Date: 202403162359
Blood Product Expiration Date: 202403162359
Blood Product Expiration Date: 202403162359
Blood Product Expiration Date: 202403182359
ISSUE DATE / TIME: 202402151100
ISSUE DATE / TIME: 202402151259
ISSUE DATE / TIME: 202402160343
ISSUE DATE / TIME: 202402171017
Unit Type and Rh: 5100
Unit Type and Rh: 5100
Unit Type and Rh: 5100
Unit Type and Rh: 5100

## 2022-07-09 LAB — TYPE AND SCREEN
ABO/RH(D): O POS
Antibody Screen: NEGATIVE
Unit division: 0
Unit division: 0
Unit division: 0
Unit division: 0

## 2022-07-09 LAB — CBC
HCT: 25.5 % — ABNORMAL LOW (ref 39.0–52.0)
Hemoglobin: 8.1 g/dL — ABNORMAL LOW (ref 13.0–17.0)
MCH: 29 pg (ref 26.0–34.0)
MCHC: 31.8 g/dL (ref 30.0–36.0)
MCV: 91.4 fL (ref 80.0–100.0)
Platelets: 171 10*3/uL (ref 150–400)
RBC: 2.79 MIL/uL — ABNORMAL LOW (ref 4.22–5.81)
RDW: 16 % — ABNORMAL HIGH (ref 11.5–15.5)
WBC: 13.7 10*3/uL — ABNORMAL HIGH (ref 4.0–10.5)
nRBC: 0.2 % (ref 0.0–0.2)

## 2022-07-09 LAB — MAGNESIUM: Magnesium: 2.5 mg/dL — ABNORMAL HIGH (ref 1.7–2.4)

## 2022-07-09 LAB — APTT: aPTT: 65 seconds — ABNORMAL HIGH (ref 24–36)

## 2022-07-09 MED ORDER — ADULT MULTIVITAMIN W/MINERALS CH
1.0000 | ORAL_TABLET | Freq: Every day | ORAL | Status: DC
  Administered 2022-07-09 – 2022-07-12 (×4): 1 via ORAL
  Filled 2022-07-09 (×4): qty 1

## 2022-07-09 MED ORDER — PROSOURCE PLUS PO LIQD
30.0000 mL | Freq: Two times a day (BID) | ORAL | Status: DC
  Administered 2022-07-09 – 2022-07-12 (×6): 30 mL via ORAL
  Filled 2022-07-09 (×6): qty 30

## 2022-07-09 MED ORDER — BOOST / RESOURCE BREEZE PO LIQD CUSTOM
1.0000 | Freq: Two times a day (BID) | ORAL | Status: DC
  Administered 2022-07-10 – 2022-07-12 (×5): 1 via ORAL

## 2022-07-09 MED ORDER — POTASSIUM CHLORIDE CRYS ER 20 MEQ PO TBCR
40.0000 meq | EXTENDED_RELEASE_TABLET | Freq: Once | ORAL | Status: AC
  Administered 2022-07-09: 40 meq via ORAL
  Filled 2022-07-09: qty 2

## 2022-07-09 MED ORDER — FUROSEMIDE 10 MG/ML IJ SOLN
40.0000 mg | Freq: Once | INTRAMUSCULAR | Status: AC
  Administered 2022-07-09: 40 mg via INTRAVENOUS
  Filled 2022-07-09: qty 4

## 2022-07-09 MED ORDER — POLYETHYLENE GLYCOL 3350 17 G PO PACK
34.0000 g | PACK | Freq: Once | ORAL | Status: AC
  Administered 2022-07-09: 34 g via ORAL
  Filled 2022-07-09: qty 2

## 2022-07-09 NOTE — Progress Notes (Signed)
Rounding Note    Patient Name: Derrick Alexander Date of Encounter: 07/09/2022  [place] Cardiologist: Pixie Casino, MD   Subjective   Pt denies CP or dyspnea  Inpatient Medications    Scheduled Meds:  latanoprost  1 drop Both Eyes QHS   metoprolol tartrate  12.5 mg Oral BID   mometasone-formoterol  2 puff Inhalation BID   pantoprazole (PROTONIX) IV  40 mg Intravenous Q12H   peg 3350 powder  0.5 kit Oral Once   And   [START ON 07/10/2022] peg 3350 powder  0.5 kit Oral Once   rosuvastatin  20 mg Oral Daily   sodium chloride flush  3 mL Intravenous Q12H   Continuous Infusions:  heparin 1,400 Units/hr (07/09/22 0644)   PRN Meds: acetaminophen **OR** acetaminophen, albuterol, hydrALAZINE, polyvinyl alcohol   Vital Signs    Vitals:   07/08/22 1938 07/08/22 2047 07/08/22 2050 07/09/22 0812  BP:  (!) 123/55 (!) 123/55   Pulse: 88 78 76   Resp: 20  19   Temp:   98.1 F (36.7 C)   TempSrc:   Oral   SpO2:   98% 100%  Weight:      Height:        Intake/Output Summary (Last 24 hours) at 07/09/2022 0826 Last data filed at 07/09/2022 0648 Gross per 24 hour  Intake 340.5 ml  Output 1045 ml  Net -704.5 ml       07/08/2022    4:12 AM 07/06/2022    8:11 AM 06/02/2022   10:34 AM  Last 3 Weights  Weight (lbs) 269 lb 10 oz 248 lb 0.3 oz 248 lb  Weight (kg) 122.3 kg 112.5 kg 112.492 kg      Telemetry    Sinus with 1 dropped beat (while asleep) and nonsustained ventricular tachycardia- Personally Reviewed   Physical Exam   GEN: NAD Neck: supple, no adenopathy Cardiac: RRR, 2/6 systolic murmur; no rub Respiratory: Mildly diminished BS; no wheeze GI: Soft, NT/ND MS: trace to 1+ edema Neuro:  Grossly intact Psych: Normal affect   Labs    High Sensitivity Troponin:   Recent Labs  Lab 07/06/22 0904 07/06/22 1849 07/07/22 0932 07/07/22 1128  TROPONINIHS 796* 3,612* 3,438* 4,113*      Chemistry Recent Labs  Lab 07/06/22 0904 07/07/22 0932  07/08/22 0439 07/09/22 0503  NA 135 135 134* 132*  K 4.8 4.1 4.0 3.7  CL 104 106 101 98  CO2 19* 22 22 22  $ GLUCOSE 139* 142* 123* 116*  BUN 36* 28* 27* 26*  CREATININE 1.19 0.85 1.04 1.06  CALCIUM 8.5* 8.5* 8.7* 8.6*  MG  --   --  2.2 2.5*  PROT 5.0*  --   --   --   ALBUMIN 3.1*  --  3.0* 3.0*  AST 38  --   --   --   ALT 12  --   --   --   ALKPHOS 39  --   --   --   BILITOT 0.6  --   --   --   GFRNONAA >60 >60 >60 >60  ANIONGAP 12 7 11 12     $ Lipids  Recent Labs  Lab 07/08/22 0439  CHOL 77  TRIG 119  HDL 28*  LDLCALC 25  CHOLHDL 2.8     Hematology Recent Labs  Lab 07/07/22 0932 07/08/22 0439 07/08/22 1742 07/09/22 0503  WBC 15.5* 16.0*  --  13.7*  RBC 2.69* 2.65*  --  2.79*  HGB 8.2* 7.9* 8.5* 8.1*  HCT 24.5* 24.3* 25.5* 25.5*  MCV 91.1 91.7  --  91.4  MCH 30.5 29.8  --  29.0  MCHC 33.5 32.5  --  31.8  RDW 17.1* 17.3*  --  16.0*  PLT 152 159  --  171     BNP Recent Labs  Lab 07/06/22 0904 07/07/22 0932  BNP 266.1* 522.0*       Radiology    ECHOCARDIOGRAM COMPLETE  Result Date: 07/08/2022    ECHOCARDIOGRAM REPORT   Patient Name:   [name] Date of Exam: 07/08/2022 Medical Rec #:  [id number]    Height:       66.0 in Accession #:    [id number]   Weight:       269.6 lb Date of Birth:  20-Jun-1945    BSA:          2.270 m Patient Age:    77 years     BP:           124/62 mmHg Patient Gender: M            HR:           89 bpm. Exam Location:  Inpatient Procedure: 2D Echo, Cardiac Doppler, Color Doppler and Intracardiac            Opacification Agent Indications:    Atrial Fibrillation I48.91                 NSTEMI I21.4  History:        Patient has prior history of Echocardiogram examinations, most                 recent 03/27/2022. CHF, Arrythmias:LBBB, Tachycardia, Atrial                 Fibrillation and PVC, Signs/Symptoms:Syncope, Dyspnea and Chest                 Pain; Risk Factors:Hypertension, Sleep Apnea and Current Smoker.  Sonographer:    Ronny Flurry Referring Phys: 57 Fowler  1. Left ventricular ejection fraction, by estimation, is 60 to 65%. The left ventricle has normal function. The left ventricle has no regional wall motion abnormalities. There is moderate left ventricular hypertrophy. Left ventricular diastolic parameters are consistent with Grade II diastolic dysfunction (pseudonormalization). Elevated left ventricular end-diastolic pressure.  2. Right ventricular systolic function is normal. The right ventricular size is normal.  3. No color flow done on atrial septum in sub costal views.  4. The mitral valve is abnormal. No evidence of mitral valve regurgitation. No evidence of mitral stenosis.  5. CW doppler of AV not adequate prior echo 03/27/22 with moderate AS mean gradient 26.4 mmhg peak 52.5 mmhg AVA 1.1 cm2 and DVI 0.34 would have tech return for better attempt at gradients Likely still moderate AS CHAT message sent to tech in Epic. The aortic valve is tricuspid. Aortic valve regurgitation is not visualized. See comments.  6. The inferior vena cava is dilated in size with >50% respiratory variability, suggesting right atrial pressure of 8 mmHg. FINDINGS  Left Ventricle: Left ventricular ejection fraction, by estimation, is 60 to 65%. The left ventricle has normal function. The left ventricle has no regional wall motion abnormalities. Definity contrast agent was given IV to delineate the left ventricular  endocardial borders. The left ventricular internal cavity size was normal in size. There is moderate left ventricular hypertrophy. Left ventricular diastolic parameters are  consistent with Grade II diastolic dysfunction (pseudonormalization). Elevated left ventricular end-diastolic pressure. Right Ventricle: The right ventricular size is normal. No increase in right ventricular wall thickness. Right ventricular systolic function is normal. Left Atrium: Left atrial size was not well visualized. Right Atrium: Right  atrial size was normal in size. Pericardium: There is no evidence of pericardial effusion. Mitral Valve: The mitral valve is abnormal. There is mild thickening of the mitral valve leaflet(s). There is mild calcification of the mitral valve leaflet(s). No evidence of mitral valve regurgitation. No evidence of mitral valve stenosis. Tricuspid Valve: The tricuspid valve is normal in structure. Tricuspid valve regurgitation is not demonstrated. No evidence of tricuspid stenosis. Aortic Valve: CW doppler of AV not adequate prior echo 03/27/22 with moderate AS mean gradient 26.4 mmhg peak 52.5 mmhg AVA 1.1 cm2 and DVI 0.34 would have tech return for better attempt at gradients Likely still moderate AS CHAT message sent to tech in Epic. The aortic valve is tricuspid. Aortic valve regurgitation is not visualized. See comments. Pulmonic Valve: The pulmonic valve was normal in structure. Pulmonic valve regurgitation is not visualized. No evidence of pulmonic stenosis. Aorta: The aortic root is normal in size and structure. Venous: The inferior vena cava is dilated in size with greater than 50% respiratory variability, suggesting right atrial pressure of 8 mmHg. IAS/Shunts: No atrial level shunt detected by color flow Doppler.  LEFT VENTRICLE PLAX 2D LVIDd:         6.20 cm   Diastology LVIDs:         4.40 cm   LV e' medial:    7.89 cm/s LV PW:         1.30 cm   LV E/e' medial:  20.2 LV IVS:        1.60 cm   LV e' lateral:   9.90 cm/s LVOT diam:     2.30 cm   LV E/e' lateral: 16.1 LV SV:         80 LV SV Index:   35 LVOT Area:     4.15 cm  RIGHT VENTRICLE RV S prime:     15.70 cm/s TAPSE (M-mode): 2.7 cm LEFT ATRIUM         Index LA diam:    3.80 cm 1.67 cm/m  AORTIC VALVE LVOT Vmax:   130.67 cm/s LVOT Vmean:  83.800 cm/s LVOT VTI:    0.193 m  AORTA Ao Root diam: 3.10 cm Ao Asc diam:  3.60 cm MITRAL VALVE MV Area (PHT): 4.80 cm     SHUNTS MV Decel Time: 158 msec     Systemic VTI:  0.19 m MV E velocity: 159.00 cm/s  Systemic  Diam: 2.30 cm MV A velocity: 89.40 cm/s MV E/A ratio:  1.78 Jenkins Rouge MD Electronically signed by Jenkins Rouge MD Signature Date/Time: 07/08/2022/11:58:52 AM    Final       Patient Profile     77 year old male with past medical history paroxysmal atrial fibrillation status post ablation April 2022, moderate aortic stenosis, diastolic dysfunction, GI bleed secondary to gastric AVMs, COPD, hypertension, tobacco abuse, obstructive sleep apnea for evaluation of elevated troponin, recurrent atrial fibrillation and acute on chronic diastolic congestive heart failure. Last echocardiogram November 2023 showed normal LV function, moderate left ventricular hypertrophy, grade 2 diastolic dysfunction, moderate aortic stenosis with mean gradient 26 mmHg and aortic valve area 1.06 cm. Patient had GI bleed December 2023 requiring 3 units packed red blood cells. Patient had EGD revealing gastric AVMs  which was treated. Xarelto was ultimately resumed. Over the preceding week he noted progressive weakness with activities as well as dyspnea on exertion. He presented for further evaluation and his hemoglobin was noted to be 5.9 and he was admitted. He has been transfused 3 units packed red blood cells. He also developed recurrent atrial fibrillation and troponin noted to be elevated. Cardiology now asked to evaluate. Echocardiogram this admission shows normal LV function, grade 2 diastolic dysfunction, aortic valve not well interrogated.  Assessment & Plan    1 elevated troponin-as outlined previously patient has not had chest pain and there are no diagnostic ST changes on his electrocardiogram.  This is felt likely secondary to demand ischemia secondary to anemia from GI bleed.  There may also be a contribution from diastolic congestive heart failure and recurrent atrial fibrillation.  No plans for ischemia evaluation at this point (he is actively bleeding and would not be a candidate for intervention.     2 acute on  chronic diastolic congestive heart failure-some improvement this morning.  Will give Lasix 40 mg IV x 1.  Volume excess is likely secondary to recent transfusion as well as moderate aortic stenosis.     3 recurrent atrial fibrillation-patient had atrial fibrillation earlier.  Will continue low-dose metoprolol.  He has been started back on IV heparin and gastroenterology is following for any recurrent GI bleeding.  Ultimately I think he should be considered for watchman after discharge.     4 aortic stenosis-moderate on echocardiogram in November.  Aortic stenosis not well interrogated yesterday.  He is scheduled to have follow-up limited images to better assess.   5 GI bleed-evaluation per gastroenterology.  Plan is for colonoscopy tomorrow.  Continue to follow hemoglobin and transfuse as needed.  He has been placed back on IV heparin.  For questions or updates, please contact [place] Please consult [email or web address] for contact info under        Signed, Kirk Ruths, MD  07/09/2022, 8:26 AM

## 2022-07-09 NOTE — Progress Notes (Signed)
Progress Note   Subjective  Patient has been tolerating heparin drip, no blood in the stool overnight that he endorses.  Hemoglobin stable.   Objective   Vital signs in last 24 hours: Temp:  [97.8 F (36.6 C)-98.3 F (36.8 C)] 97.8 F (36.6 C) (02/18 0903) Pulse Rate:  [76-88] 76 (02/17 2050) Resp:  [18-23] 19 (02/17 2050) BP: (122-137)/(55-83) 123/55 (02/17 2050) SpO2:  [96 %-100 %] 100 % (02/18 0812) Last BM Date : 07/07/22 General:    white male in NAD Neurologic:  Alert and oriented,  grossly normal neurologically. Psych:  Cooperative. Normal mood and affect.  Intake/Output from previous day: 02/17 0701 - 02/18 0700 In: 340.5 [I.V.:3; Blood:337.5] Out: 1305 [Urine:1305] Intake/Output this shift: Total I/O In: -  Out: 900 [Urine:900]  Lab Results: Recent Labs    07/07/22 0932 07/08/22 0439 07/08/22 1742 07/09/22 0503  WBC 15.5* 16.0*  --  13.7*  HGB 8.2* 7.9* 8.5* 8.1*  HCT 24.5* 24.3* 25.5* 25.5*  PLT 152 159  --  171   BMET Recent Labs    07/07/22 0932 07/08/22 0439 07/09/22 0503  NA 135 134* 132*  K 4.1 4.0 3.7  CL 106 101 98  CO2 22 22 22  $ GLUCOSE 142* 123* 116*  BUN 28* 27* 26*  CREATININE 0.85 1.04 1.06  CALCIUM 8.5* 8.7* 8.6*   LFT Recent Labs    07/09/22 0503  ALBUMIN 3.0*   PT/INR No results for input(s): "LABPROT", "INR" in the last 72 hours.  Studies/Results: ECHOCARDIOGRAM COMPLETE  Result Date: 07/08/2022    ECHOCARDIOGRAM REPORT   Patient Name:   Derrick Alexander Date of Exam: 07/08/2022 Medical Rec #:  [id number]    Height:       66.0 in Accession #:    [id number]   Weight:       269.6 lb Date of Birth:  1946-02-17    BSA:          2.270 m Patient Age:    77 years     BP:           124/62 mmHg Patient Gender: M            HR:           89 bpm. Exam Location:  Inpatient Procedure: 2D Echo, Cardiac Doppler, Color Doppler and Intracardiac            Opacification Agent Indications:    Atrial Fibrillation I48.91                  NSTEMI I21.4  History:        Patient has prior history of Echocardiogram examinations, most                 recent 03/27/2022. CHF, Arrythmias:LBBB, Tachycardia, Atrial                 Fibrillation and PVC, Signs/Symptoms:Syncope, Dyspnea and Chest                 Pain; Risk Factors:Hypertension, Sleep Apnea and Current Smoker.  Sonographer:    Ronny Flurry Referring Phys: 49 Betances  1. Left ventricular ejection fraction, by estimation, is 60 to 65%. The left ventricle has normal function. The left ventricle has no regional wall motion abnormalities. There is moderate left ventricular hypertrophy. Left ventricular diastolic parameters are consistent with Grade II diastolic dysfunction (pseudonormalization). Elevated left ventricular end-diastolic pressure.  2. Right ventricular systolic  function is normal. The right ventricular size is normal.  3. No color flow done on atrial septum in sub costal views.  4. The mitral valve is abnormal. No evidence of mitral valve regurgitation. No evidence of mitral stenosis.  5. CW doppler of AV not adequate prior echo 03/27/22 with moderate AS mean gradient 26.4 mmhg peak 52.5 mmhg AVA 1.1 cm2 and DVI 0.34 would have tech return for better attempt at gradients Likely still moderate AS CHAT message sent to tech in Epic. The aortic valve is tricuspid. Aortic valve regurgitation is not visualized. See comments.  6. The inferior vena cava is dilated in size with >50% respiratory variability, suggesting right atrial pressure of 8 mmHg. FINDINGS  Left Ventricle: Left ventricular ejection fraction, by estimation, is 60 to 65%. The left ventricle has normal function. The left ventricle has no regional wall motion abnormalities. Definity contrast agent was given IV to delineate the left ventricular  endocardial borders. The left ventricular internal cavity size was normal in size. There is moderate left ventricular hypertrophy. Left ventricular diastolic parameters  are consistent with Grade II diastolic dysfunction (pseudonormalization). Elevated left ventricular end-diastolic pressure. Right Ventricle: The right ventricular size is normal. No increase in right ventricular wall thickness. Right ventricular systolic function is normal. Left Atrium: Left atrial size was not well visualized. Right Atrium: Right atrial size was normal in size. Pericardium: There is no evidence of pericardial effusion. Mitral Valve: The mitral valve is abnormal. There is mild thickening of the mitral valve leaflet(s). There is mild calcification of the mitral valve leaflet(s). No evidence of mitral valve regurgitation. No evidence of mitral valve stenosis. Tricuspid Valve: The tricuspid valve is normal in structure. Tricuspid valve regurgitation is not demonstrated. No evidence of tricuspid stenosis. Aortic Valve: CW doppler of AV not adequate prior echo 03/27/22 with moderate AS mean gradient 26.4 mmhg peak 52.5 mmhg AVA 1.1 cm2 and DVI 0.34 would have tech return for better attempt at gradients Likely still moderate AS CHAT message sent to tech in Epic. The aortic valve is tricuspid. Aortic valve regurgitation is not visualized. See comments. Pulmonic Valve: The pulmonic valve was normal in structure. Pulmonic valve regurgitation is not visualized. No evidence of pulmonic stenosis. Aorta: The aortic root is normal in size and structure. Venous: The inferior vena cava is dilated in size with greater than 50% respiratory variability, suggesting right atrial pressure of 8 mmHg. IAS/Shunts: No atrial level shunt detected by color flow Doppler.  LEFT VENTRICLE PLAX 2D LVIDd:         6.20 cm   Diastology LVIDs:         4.40 cm   LV e' medial:    7.89 cm/s LV PW:         1.30 cm   LV E/e' medial:  20.2 LV IVS:        1.60 cm   LV e' lateral:   9.90 cm/s LVOT diam:     2.30 cm   LV E/e' lateral: 16.1 LV SV:         80 LV SV Index:   35 LVOT Area:     4.15 cm  RIGHT VENTRICLE RV S prime:     15.70 cm/s  TAPSE (M-mode): 2.7 cm LEFT ATRIUM         Index LA diam:    3.80 cm 1.67 cm/m  AORTIC VALVE LVOT Vmax:   130.67 cm/s LVOT Vmean:  83.800 cm/s LVOT VTI:    0.193 m  AORTA Ao Root diam: 3.10 cm Ao Asc diam:  3.60 cm MITRAL VALVE MV Area (PHT): 4.80 cm     SHUNTS MV Decel Time: 158 msec     Systemic VTI:  0.19 m MV E velocity: 159.00 cm/s  Systemic Diam: 2.30 cm MV A velocity: 89.40 cm/s MV E/A ratio:  1.78 Jenkins Rouge MD Electronically signed by Jenkins Rouge MD Signature Date/Time: 07/08/2022/11:58:52 AM    Final        Assessment / Plan:    77 y/o male here with the following:   GI bleeding - unclear source Anemia PAF on Anticoagulation CHF Elevated troponin   EGD Friday with Dr. Benson Norway - some small erythematous areas without any obvious stigmata for bleeding, they were ablated.  He has not had any bowel movements / bleeding, hemoglobin has slightly drifted and was given a unit of blood transfusion. His BUN is elevated suggesting small bowel source of bleeding although he has not had a colonoscopy since October 2020, had some polyps removed.   Discussed the situation with the patient and his family yesterday and again today. He does warrant anticoagulation given his cardiac history, however hesitant to resume oral anticoagulant until we can clarify his bleeding source.  In this light we have plans to do a colonoscopy tomorrow with Dr. Benson Norway to clear his colon, and if negative, consider capsule to clear his small bowel.  I discussed risk and benefits of the colonoscopy, anesthesia, and capsule if needed, he and his wife wish to proceed.  Clear liquid diet today, prep this evening and tomorrow AM. Hold heparin drip 4 hours prior to exam (currently scheduled for 1230)   RECOMMEND: - continue clear liquid diet , NPO after MN other than prep - plan on colonoscopy tomorrow with Dr. Benson Norway.  - bowel prep this evening / tomorrow AM  - if colonoscopy negative, would consider capsule endoscopy to clear  small bowel - on heparin drip today, hold at 0800 tomorrow AM for colonoscopy tomorrow - trend Hgb and monitor for recurrent bleeding  Call with questions or changes in his status. Dr. Benson Norway to assume his care tomorrow.  Jolly Mango, MD [facility]

## 2022-07-09 NOTE — Progress Notes (Signed)
ANTICOAGULATION CONSULT NOTE - Follow Up Consult  Pharmacy Consult for heparin Indication: atrial fibrillation and elevated troponin  Labs: Recent Labs    07/06/22 1849 07/07/22 0126 07/07/22 0932 07/07/22 1128 07/08/22 0439 07/08/22 1742 07/08/22 1922 07/09/22 0503  HGB 7.8*   < > 8.2*  --  7.9* 8.5*  --  8.1*  HCT 23.4*   < > 24.5*  --  24.3* 25.5*  --  25.5*  PLT 166  --  152  --  159  --   --  171  APTT  --   --   --   --   --   --  57* 65*  HEPARINUNFRC  --   --   --   --   --   --  0.30 0.31  CREATININE  --   --  0.85  --  1.04  --   --  1.06  TROPONINIHS 3,612*  --  3,438* 4,113*  --   --   --   --    < > = values in this interval not displayed.    Assessment/Plan:  77yo male therapeutic on heparin after rate change. Will continue infusion at current rate of 1400 units/hr and confirm stable with additional level.   Wynona Neat, PharmD, BCPS  07/09/2022,6:10 AM

## 2022-07-09 NOTE — Progress Notes (Signed)
Pt. Refused cpap for tonight. RT informed pt. To notify if he changes his mind.  

## 2022-07-09 NOTE — Progress Notes (Signed)
Initial Nutrition Assessment RD working remotely.   DOCUMENTATION CODES:   Obesity unspecified  INTERVENTION:  - ordered Boost Breeze BID, each supplement provides 250 kcal and 9 grams of protein.  - ordered 30 ml Prosource Plus BID, each supplement provides 100 kcal and 15 grams protein.   - ordered 1 tablet multivitamin with minerals.   NUTRITION DIAGNOSIS:   Inadequate protein intake related to other (see comment) (current diet order) as evidenced by other (comment) (CLD unable to meet estimated protein need).  GOAL:   Patient will meet greater than or equal to 90% of their needs  MONITOR:   PO intake, Supplement acceptance, Diet advancement, Labs, Weight trends  REASON FOR ASSESSMENT:   Consult Poor PO  ASSESSMENT:   77 year old male with medical history of A-fib on Xarelto, GIB/gastric AVMs, OSA on CPAP, diastolic CHF, COPD, morbid obesity, HTN and HLD. He presented to the ED due to melena for ~1 week. He was hospitalized 04/27/22-04/30/22 for acute upper GI bleed with gastric AVM that was treated with APC. Patient admitted for symptomatic anemia d/t acute UGIB and GI was consulted. He is s/p EGD which showed erythematous gastric mucosa. Pending colonoscopy on 07/10/22.  Diet advanced from NPO to Regular, thin liquids on 2/16 at 1427, changed to CLD yesterday at 1205. Patient to be made NPO at midnight. No meal completion percentages documented in the flow sheet.  Weight yesterday was 270 lb which is higher than weights as far back as 03/2021. Weight on 05/09/22 was 248 lb. Will continue to monitor closely. No information documented in the edema section of flow sheet.  Per notes: - acute on chronic blood loss anemia d/t UGIB - acute on chronic CHF and moderate aortic stenosis   Labs reviewed; Na: 132 mmol/l, BUN: 26 mg/dl, Ca: 8.6 mg/dl, Mg: 2.5 mg/dl.  Medications reviewed; 40 mg IV lasix x1 dose 2/18, 40 mg IV protonix BID, 34 g miralax x1 dose 2/18, 40 mEq  Klor-Con x1 dose 2/18.    NUTRITION - FOCUSED PHYSICAL EXAM:  RD working remotely.  Diet Order:   Diet Order             Diet NPO time specified Except for: Sips with Meds  Diet effective midnight           Diet clear liquid Room service appropriate? Yes; Fluid consistency: Thin  Diet effective now                   EDUCATION NEEDS:   No education needs have been identified at this time  Skin:  Skin Assessment: Reviewed RN Assessment  Last BM:  2/18 (type 6 x1, large amount, black)  Height:   Ht Readings from Last 1 Encounters:  07/06/22 5' 6"$  (1.676 m)    Weight:   Wt Readings from Last 1 Encounters:  07/08/22 122.3 kg     BMI:  Body mass index is 43.52 kg/m.  Estimated Nutritional Needs:  Kcal:  1850-2100 kcal Protein:  90-100 grams Fluid:  >/= 2 L/day     Jarome Matin, MS, RD, LDN, CNSC Clinical Dietitian PRN/Relief staff On-call/weekend pager # available in [facility]

## 2022-07-09 NOTE — Progress Notes (Signed)
PROGRESS NOTE  Derrick Alexander [id number] DOB: [birth year]   PCP: [name], MD  Patient is from: Home.  Lives with family.  DOA: 07/06/2022 LOS: 3  Chief complaints Chief Complaint  Patient presents with   Shortness of Breath     Brief Narrative / Interim history: 77 year old M with PMH of A-fib on Xarelto, GIB/gastric AVMs, OSA on CPAP, diastolic CHF, COPD, morbid obesity, HTN and HLD presenting with melena for about a week, and admitted for symptomatic anemia due to acute upper GI bleed, elevated troponin and some respiratory distress.  Patient was hospitalized 04/27/2022-04/30/2022 for acute upper GI bleed with gastric AVM that was treated with APC.  He reported vague chest pain.  Troponin 796 and trended up to 3612.  EKG with some ST depressions in lateral leads which appears to be new.  2 units of blood ordered.  GI consulted and patient was admitted.   The next day, troponin continued to rise although patient did not have chest pain.  Cardiology consulted.   EGD done and showed erythematous gastric mucosa.  Plan for colonoscopy on 2/19.   Subjective: Seen and examined earlier this morning.  He had some runs of VT overnight.  Not symptomatic.  Reports dark stool x 2 yesterday.  Denies hematochezia.  Denies chest pain  Objective: Vitals:   07/09/22 0812 07/09/22 0903 07/09/22 1237 07/09/22 1600  BP:  (!) 133/59 110/69 (!) 154/71  Pulse:  84 69 99  Resp:  17 (!) 23 20  Temp:  97.8 F (36.6 C) 98 F (36.7 C)   TempSrc:  Oral Oral   SpO2: 100% 100% 96% 97%  Weight:      Height:        Examination:  GENERAL: No apparent distress.  Nontoxic. HEENT: MMM.  Vision and hearing grossly intact.  NECK: Supple.  Difficult to assess JVD due to body habitus RESP:  No IWOB.  Fair aeration bilaterally but limited exam due to body habitus. CVS:  RRR.  2/6 SEM over RUSB> LUSB ABD/GI/GU: BS+. Abd soft, NTND.  MSK/EXT:   No apparent deformity. Moves extremities. No edema.  SKIN: no  apparent skin lesion or wound NEURO: Awake and alert. Oriented appropriately.  No apparent focal neuro deficit. PSYCH: Calm. Normal affect.   Procedures:  2/16-EGD with erythematous gastric mucosa.  Microbiology summarized: [id number], influenza and RSV PCR nonreactive.  Assessment and plan: Principal Problem:   Acute GI bleeding Active Problems:   Chronic obstructive pulmonary disease (HCC)   Paroxysmal atrial fibrillation with RVR (HCC)   Morbid obesity with BMI of 40.0-44.9, adult (HCC)   Essential hypertension   Paroxysmal atrial fibrillation (HCC)   OSA on CPAP   Anemia due to gastrointestinal blood loss   Glaucoma (increased eye pressure)   Symptomatic anemia   Chronic diastolic CHF (congestive heart failure) (HCC)   Elevated troponin   Abnormal EKG   Chronic obstructive pulmonary disease (COPD) (HCC)   Anticoagulated  Acute on chronic blood loss anemia due to upper GI bleed: Patient with history of gastric AVMs that was treated with APC in 04/2022.  He is on Xarelto for atrial fibrillation.  Transfused 4 units so far with some improvement in Hgb.  EGD as above. Recent Labs    04/29/22 2156 04/30/22 0535 05/05/22 0956 07/06/22 0904 07/06/22 1849 07/07/22 0126 07/07/22 0932 07/08/22 0439 07/08/22 1742 07/09/22 0503  HGB 8.4* 7.9* 7.8* 5.9* 7.8* 6.7* 8.2* 7.9* 8.5* 8.1*  -Continue IV Protonix -Maintain 2 PIV -Monitor H&H  and transfuse for Hgb < 8.  -Discontinue IV heparin. -Plan for colonoscopy on 2/19.  Acute on chronic diastolic CHF/moderate aortic stenosis: TTE with LVEF of 60 to 65%, G2-DD and mod AS. BNP slightly up likely in the setting of anemia, IV fluid and blood transfusion.  Feels his breathing is labored.  Grossly euvolemic but difficult exam due to body habitus. -Cardiology following-IV Lasix 40 mg x 1 -Strict intake and output, daily weights and renal functions.   Elevated troponin/abnormal EKG: Troponin 800>> 3600>> 4100>>.  EKG with some ST  depression in lateral leads. Reported vague chest pain on presentation.  Currently chest pain-free but dyspnea.  TTE as above.  Cardiology does not think this ACS. -Discontinue IV heparin and hold anticoagulation pending colonoscopy -Cardiology following.   Paroxysmal atrial fibrillation and SVT: Currently in sinus rhythm with PVCs.  Does not seem to be on rate or rhythm control medications at home.  CHA2DS2-VASc score > 4. -Started on metoprolol 12.5 mg twice daily -Hold anticoagulation in the setting of GI bleed  Chronic COPD -Continue on inhalers -Continue Xopenex as needed  Essential hypertension: Normotensive for most part but unreliable since it is obtained on his legs. -Metoprolol as above. -Continue holding irbesartan, spironolactone for now  OSA reportedly compliant with CPAP at home. -Encouraged to use CPAP instead of oxygen.  Hyponatremia: Mild. -Continue monitoring  Leukocytosis: Likely demargination. -Continue monitoring  Hyperlipidemia -Continue rosuvastatin   Glaucoma -Continue latanoprost   Morbid obesity Body mass index is 43.52 kg/m.           DVT prophylaxis:  SCDs Start: 07/06/22 1310  Code Status: Full code Family Communication: Updated patient's wife at the bedside. Level of care: Progressive Status is: Inpatient Remains inpatient appropriate because: Blood loss anemia and elevated troponin   Final disposition: TBD Consultants:  Neurology Cardiology   55 minutes with more than 50% spent in reviewing records, counseling patient/family and coordinating care.   Sch Meds:  Scheduled Meds:  latanoprost  1 drop Both Eyes QHS   metoprolol tartrate  12.5 mg Oral BID   mometasone-formoterol  2 puff Inhalation BID   pantoprazole (PROTONIX) IV  40 mg Intravenous Q12H   peg 3350 powder  0.5 kit Oral Once   And   [START ON 07/10/2022] peg 3350 powder  0.5 kit Oral Once   rosuvastatin  20 mg Oral Daily   sodium chloride flush  3 mL  Intravenous Q12H   Continuous Infusions:   PRN Meds:.acetaminophen **OR** acetaminophen, albuterol, hydrALAZINE, polyvinyl alcohol  Antimicrobials: Anti-infectives (From admission, onward)    None        I have personally reviewed the following labs and images: CBC: Recent Labs  Lab 07/06/22 0904 07/06/22 1849 07/07/22 0126 07/07/22 0932 07/08/22 0439 07/08/22 1742 07/09/22 0503  WBC 13.6* 17.0*  --  15.5* 16.0*  --  13.7*  NEUTROABS  --   --   --   --  13.1*  --   --   HGB 5.9* 7.8* 6.7* 8.2* 7.9* 8.5* 8.1*  HCT 19.7* 23.4* 21.1* 24.5* 24.3* 25.5* 25.5*  MCV 96.1 90.7  --  91.1 91.7  --  91.4  PLT 181 166  --  152 159  --  171   BMP &GFR Recent Labs  Lab 07/06/22 0904 07/07/22 0932 07/08/22 0439 07/09/22 0503  NA 135 135 134* 132*  K 4.8 4.1 4.0 3.7  CL 104 106 101 98  CO2 19* 22 22 22  $ GLUCOSE 139* 142*  123* 116*  BUN 36* 28* 27* 26*  CREATININE 1.19 0.85 1.04 1.06  CALCIUM 8.5* 8.5* 8.7* 8.6*  MG  --   --  2.2 2.5*  PHOS  --   --  3.7 3.1   Estimated Creatinine Clearance: 72 mL/min (by C-G formula based on SCr of 1.06 mg/dL). Liver & Pancreas: Recent Labs  Lab 07/06/22 0904 07/08/22 0439 07/09/22 0503  AST 38  --   --   ALT 12  --   --   ALKPHOS 39  --   --   BILITOT 0.6  --   --   PROT 5.0*  --   --   ALBUMIN 3.1* 3.0* 3.0*   No results for input(s): "LIPASE", "AMYLASE" in the last 168 hours. No results for input(s): "AMMONIA" in the last 168 hours. Diabetic: No results for input(s): "HGBA1C" in the last 72 hours. No results for input(s): "GLUCAP" in the last 168 hours. Cardiac Enzymes: No results for input(s): "CKTOTAL", "CKMB", "CKMBINDEX", "TROPONINI" in the last 168 hours. Recent Labs    05/05/22 0956  PROBNP 756*   Coagulation Profile: No results for input(s): "INR", "PROTIME" in the last 168 hours. Thyroid Function Tests: No results for input(s): "TSH", "T4TOTAL", "FREET4", "T3FREE", "THYROIDAB" in the last 72 hours. Lipid  Profile: Recent Labs    07/08/22 0439  CHOL 77  HDL 28*  LDLCALC 25  TRIG 119  CHOLHDL 2.8   Anemia Panel: No results for input(s): "VITAMINB12", "FOLATE", "FERRITIN", "TIBC", "IRON", "RETICCTPCT" in the last 72 hours. Urine analysis: No results found for: "COLORURINE", "APPEARANCEUR", "LABSPEC", "PHURINE", "GLUCOSEU", "HGBUR", "BILIRUBINUR", "KETONESUR", "PROTEINUR", "UROBILINOGEN", "NITRITE", "LEUKOCYTESUR" Sepsis Labs: Invalid input(s): "PROCALCITONIN", "LACTICIDVEN"  Microbiology: Recent Results (from the past 240 hour(s))  Resp panel by RT-PCR (RSV, Flu A&B, Covid) Anterior Nasal Swab     Status: None   Collection Time: 07/06/22  8:08 AM   Specimen: Anterior Nasal Swab  Result Value Ref Range Status   SARS Coronavirus 2 by RT PCR NEGATIVE NEGATIVE Final   Influenza A by PCR NEGATIVE NEGATIVE Final   Influenza B by PCR NEGATIVE NEGATIVE Final    Comment: (NOTE) The Xpert Xpress SARS-CoV-2/FLU/RSV plus assay is intended as an aid in the diagnosis of influenza from Nasopharyngeal swab specimens and should not be used as a sole basis for treatment. Nasal washings and aspirates are unacceptable for Xpert Xpress SARS-CoV-2/FLU/RSV testing.  Fact Sheet for Patients: EntrepreneurPulse.com.au  Fact Sheet for Healthcare Providers: IncredibleEmployment.be  This test is not yet approved or cleared by the [place] FDA and has been authorized for detection and/or diagnosis of SARS-CoV-2 by FDA under an Emergency Use Authorization (EUA). This EUA will remain in effect (meaning this test can be used) for the duration of the COVID-19 declaration under Section 564(b)(1) of the Act, 21 U.S.C. section 360bbb-3(b)(1), unless the authorization is terminated or revoked.     Resp Syncytial Virus by PCR NEGATIVE NEGATIVE Final    Comment: (NOTE) Fact Sheet for Patients: EntrepreneurPulse.com.au  Fact Sheet for Healthcare  Providers: IncredibleEmployment.be  This test is not yet approved or cleared by the [place] FDA and has been authorized for detection and/or diagnosis of SARS-CoV-2 by FDA under an Emergency Use Authorization (EUA). This EUA will remain in effect (meaning this test can be used) for the duration of the COVID-19 declaration under Section 564(b)(1) of the Act, 21 U.S.C. section 360bbb-3(b)(1), unless the authorization is terminated or revoked.  Performed at [facility], [place]  [address] [place], [place] [address]     Radiology Studies: No results found.    Erinn Mendosa T. Shonto  If 7PM-7AM, please contact night-coverage [email or web address] 07/09/2022, 4:42 PM

## 2022-07-09 NOTE — Progress Notes (Signed)
Patient has an [place] run [place]. Latest BP is 133/66, HR: 80, RR: 17, O2 sat: 100%. Pt is asymptomatic. Ninetta Lights, MD made aware. Will continue to monitor the patient.

## 2022-07-10 ENCOUNTER — Inpatient Hospital Stay (HOSPITAL_COMMUNITY): Payer: Medicare Other | Admitting: Certified Registered Nurse Anesthetist

## 2022-07-10 ENCOUNTER — Encounter (HOSPITAL_COMMUNITY)
Admission: EM | Disposition: A | Discharge status: Home Health | Payer: Self-pay | Source: Home / Self Care | Attending: Student

## 2022-07-10 ENCOUNTER — Encounter (HOSPITAL_COMMUNITY): Payer: Self-pay | Admitting: Internal Medicine

## 2022-07-10 DIAGNOSIS — K5731 Diverticulosis of large intestine without perforation or abscess with bleeding: Secondary | ICD-10-CM

## 2022-07-10 DIAGNOSIS — Z9989 Dependence on other enabling machines and devices: Secondary | ICD-10-CM

## 2022-07-10 DIAGNOSIS — K529 Noninfective gastroenteritis and colitis, unspecified: Secondary | ICD-10-CM

## 2022-07-10 DIAGNOSIS — G4733 Obstructive sleep apnea (adult) (pediatric): Secondary | ICD-10-CM

## 2022-07-10 DIAGNOSIS — K635 Polyp of colon: Secondary | ICD-10-CM

## 2022-07-10 HISTORY — PX: COLONOSCOPY WITH PROPOFOL: SHX5780

## 2022-07-10 HISTORY — PX: BIOPSY: SHX5522

## 2022-07-10 HISTORY — PX: POLYPECTOMY: SHX5525

## 2022-07-10 LAB — BASIC METABOLIC PANEL
Anion gap: 9 (ref 5–15)
BUN: 21 mg/dL (ref 8–23)
CO2: 24 mmol/L (ref 22–32)
Calcium: 8.5 mg/dL — ABNORMAL LOW (ref 8.9–10.3)
Chloride: 100 mmol/L (ref 98–111)
Creatinine, Ser: 1.03 mg/dL (ref 0.61–1.24)
GFR, Estimated: >60 mL/min (ref >60)
Glucose, Bld: 121 mg/dL — ABNORMAL HIGH (ref 70–99)
Potassium: 4.3 mmol/L (ref 3.5–5.1)
Sodium: 133 mmol/L — ABNORMAL LOW (ref 135–145)

## 2022-07-10 LAB — RETICULOCYTES
Immature Retic Fract: 29.2 % — ABNORMAL HIGH (ref 2.3–15.9)
RBC.: 2.76 MIL/uL — ABNORMAL LOW (ref 4.22–5.81)
Retic Count, Absolute: 186.9 10*3/uL — ABNORMAL HIGH (ref 19.0–186.0)
Retic Ct Pct: 6.8 % — ABNORMAL HIGH (ref 0.4–3.1)

## 2022-07-10 LAB — CBC
HCT: 24.7 % — ABNORMAL LOW (ref 39.0–52.0)
Hemoglobin: 7.9 g/dL — ABNORMAL LOW (ref 13.0–17.0)
MCH: 29.2 pg (ref 26.0–34.0)
MCHC: 32 g/dL (ref 30.0–36.0)
MCV: 91.1 fL (ref 80.0–100.0)
Platelets: 192 10*3/uL (ref 150–400)
RBC: 2.71 MIL/uL — ABNORMAL LOW (ref 4.22–5.81)
RDW: 15.6 % — ABNORMAL HIGH (ref 11.5–15.5)
WBC: 10.3 10*3/uL (ref 4.0–10.5)
nRBC: 0 % (ref 0.0–0.2)

## 2022-07-10 LAB — FOLATE: Folate: 11.1 ng/mL (ref >5.9)

## 2022-07-10 LAB — VITAMIN B12: Vitamin B-12: 544 pg/mL (ref 180–914)

## 2022-07-10 LAB — IRON AND TIBC
Iron: 17 ug/dL — ABNORMAL LOW (ref 45–182)
Saturation Ratios: 4 % — ABNORMAL LOW (ref 17.9–39.5)
TIBC: 384 ug/dL (ref 250–450)
UIBC: 367 ug/dL

## 2022-07-10 LAB — FERRITIN: Ferritin: 21 ng/mL — ABNORMAL LOW (ref 24–336)

## 2022-07-10 SURGERY — COLONOSCOPY WITH PROPOFOL
Anesthesia: Monitor Anesthesia Care

## 2022-07-10 MED ORDER — LACTATED RINGERS IV SOLN: INTRAVENOUS | Status: DC

## 2022-07-10 MED ORDER — PROPOFOL 500 MG/50ML IV EMUL
INTRAVENOUS | Status: DC | PRN
  Administered 2022-07-10: 125 ug/kg/min via INTRAVENOUS

## 2022-07-10 MED ORDER — PHENYLEPHRINE 80 MCG/ML (10ML) SYRINGE FOR IV PUSH (FOR BLOOD PRESSURE SUPPORT)
PREFILLED_SYRINGE | INTRAVENOUS | Status: DC | PRN
  Administered 2022-07-10 (×3): 80 ug via INTRAVENOUS

## 2022-07-10 MED ORDER — PROPOFOL 10 MG/ML IV BOLUS
INTRAVENOUS | Status: DC | PRN
  Administered 2022-07-10: 10 mg via INTRAVENOUS

## 2022-07-10 MED ORDER — SODIUM CHLORIDE 0.9 % IV SOLN
250.0000 mg | Freq: Every day | INTRAVENOUS | Status: AC
  Administered 2022-07-10 – 2022-07-11 (×2): 250 mg via INTRAVENOUS
  Filled 2022-07-10 (×2): qty 20

## 2022-07-10 MED ORDER — LIDOCAINE 2% (20 MG/ML) 5 ML SYRINGE
INTRAMUSCULAR | Status: DC | PRN
  Administered 2022-07-10: 20 mg via INTRAVENOUS

## 2022-07-10 SURGICAL SUPPLY — 22 items

## 2022-07-10 NOTE — Transfer of Care (Signed)
Immediate Anesthesia Transfer of Care Note  Patient: Derrick Alexander  Procedure(s) Performed: COLONOSCOPY WITH PROPOFOL BIOPSY POLYPECTOMY  Patient Location: Endoscopy Unit  Anesthesia Type:MAC  Level of Consciousness: drowsy  Airway & Oxygen Therapy: Patient Spontanous Breathing and Patient connected to face mask oxygen  Post-op Assessment: Report given to RN and Post -op Vital signs reviewed and stable  Post vital signs: Reviewed and stable  Last Vitals:  Vitals Value Taken Time  BP 134/89 07/10/22 1243  Temp 36.5 C 07/10/22 1243  Pulse 66 07/10/22 1244  Resp 10 07/10/22 1244  SpO2 100 % 07/10/22 1244  Vitals shown include unvalidated device data.  Last Pain:  Vitals:   07/10/22 1243  TempSrc: Tympanic  PainSc: 0-No pain         Complications: No notable events documented.

## 2022-07-10 NOTE — Progress Notes (Signed)
Rounding Note    Patient Name: Derrick Alexander Date of Encounter: 07/10/2022  [place] Cardiologist: Pixie Casino, MD   Subjective   Sitting up in bed.  Colonoscopy prep overnight.  No complaints.  Currently in the room with Dr. Cyndia Skeeters, primary team.  Inpatient Medications    Scheduled Meds:  (feeding supplement) PROSource Plus  30 mL Oral BID BM   feeding supplement  1 Container Oral BID BM   latanoprost  1 drop Both Eyes QHS   metoprolol tartrate  12.5 mg Oral BID   mometasone-formoterol  2 puff Inhalation BID   multivitamin with minerals  1 tablet Oral Daily   pantoprazole (PROTONIX) IV  40 mg Intravenous Q12H   rosuvastatin  20 mg Oral Daily   sodium chloride flush  3 mL Intravenous Q12H   Continuous Infusions:  PRN Meds: acetaminophen **OR** acetaminophen, albuterol, hydrALAZINE, polyvinyl alcohol   Vital Signs    Vitals:   07/09/22 2150 07/10/22 0000 07/10/22 0824 07/10/22 0852  BP: 119/62 (!) 105/54  129/83  Pulse: 86   68  Resp: 16 20  13  $ Temp: 98.1 F (36.7 C) 98.4 F (36.9 C)    TempSrc: Oral Oral    SpO2: 100%  99% 100%  Weight:      Height:        Intake/Output Summary (Last 24 hours) at 07/10/2022 0928 Last data filed at 07/09/2022 1049 Gross per 24 hour  Intake --  Output 900 ml  Net -900 ml      07/08/2022    4:12 AM 07/06/2022    8:11 AM 06/02/2022   10:34 AM  Last 3 Weights  Weight (lbs) 269 lb 10 oz 248 lb 0.3 oz 248 lb  Weight (kg) 122.3 kg 112.5 kg 112.492 kg      Telemetry    Atrial fibrillation with good overall rate control.  Frequent PVCs- Personally Reviewed  ECG    No new.  Original EKG does show subtle ST segment depression diffusely with A-fib.- Personally Reviewed  Physical Exam   GEN: No acute distress.   Neck: No JVD Cardiac: Irregularly irregular on telemetry, frequent PVCs Respiratory: Normal respiratory effort. Neuro:  Nonfocal  Psych: Normal affect   Labs    High Sensitivity Troponin:    Recent Labs  Lab 07/06/22 0904 07/06/22 1849 07/07/22 0932 07/07/22 1128  TROPONINIHS 796* 3,612* 3,438* 4,113*     Chemistry Recent Labs  Lab 07/06/22 0904 07/07/22 0932 07/08/22 0439 07/09/22 0503 07/10/22 0428  NA 135   < > 134* 132* 133*  K 4.8   < > 4.0 3.7 4.3  CL 104   < > 101 98 100  CO2 19*   < > 22 22 24  $ GLUCOSE 139*   < > 123* 116* 121*  BUN 36*   < > 27* 26* 21  CREATININE 1.19   < > 1.04 1.06 1.03  CALCIUM 8.5*   < > 8.7* 8.6* 8.5*  MG  --   --  2.2 2.5*  --   PROT 5.0*  --   --   --   --   ALBUMIN 3.1*  --  3.0* 3.0*  --   AST 38  --   --   --   --   ALT 12  --   --   --   --   ALKPHOS 39  --   --   --   --   BILITOT 0.6  --   --   --   --  GFRNONAA >60   < > >60 >60 >60  ANIONGAP 12   < > 11 12 9   $ < > = values in this interval not displayed.    Lipids  Recent Labs  Lab 07/08/22 0439  CHOL 77  TRIG 119  HDL 28*  LDLCALC 25  CHOLHDL 2.8    Hematology Recent Labs  Lab 07/08/22 0439 07/08/22 1742 07/09/22 0503 07/10/22 0428  WBC 16.0*  --  13.7* 10.3  RBC 2.65*  --  2.79* 2.71*  2.76*  HGB 7.9* 8.5* 8.1* 7.9*  HCT 24.3* 25.5* 25.5* 24.7*  MCV 91.7  --  91.4 91.1  MCH 29.8  --  29.0 29.2  MCHC 32.5  --  31.8 32.0  RDW 17.3*  --  16.0* 15.6*  PLT 159  --  171 192   Thyroid No results for input(s): "TSH", "FREET4" in the last 168 hours.  BNP Recent Labs  Lab 07/06/22 0904 07/07/22 0932  BNP 266.1* 522.0*    DDimer No results for input(s): "DDIMER" in the last 168 hours.   Radiology    ECHOCARDIOGRAM COMPLETE  Result Date: 07/08/2022    ECHOCARDIOGRAM REPORT   Patient Name:   Derrick Alexander Date of Exam: 07/08/2022 Medical Rec #:  [id number]    Height:       66.0 in Accession #:    [id number]   Weight:       269.6 lb Date of Birth:  1945-06-06    BSA:          2.270 m Patient Age:    32 years     BP:           124/62 mmHg Patient Gender: M            HR:           89 bpm. Exam Location:  Inpatient Procedure: 2D Echo, Cardiac  Doppler, Color Doppler and Intracardiac            Opacification Agent Indications:    Atrial Fibrillation I48.91                 NSTEMI I21.4  History:        Patient has prior history of Echocardiogram examinations, most                 recent 03/27/2022. CHF, Arrythmias:LBBB, Tachycardia, Atrial                 Fibrillation and PVC, Signs/Symptoms:Syncope, Dyspnea and Chest                 Pain; Risk Factors:Hypertension, Sleep Apnea and Current Smoker.  Sonographer:    Ronny Flurry Referring Phys: 100 [place]  1. Left ventricular ejection fraction, by estimation, is 60 to 65%. The left ventricle has normal function. The left ventricle has no regional wall motion abnormalities. There is moderate left ventricular hypertrophy. Left ventricular diastolic parameters are consistent with Grade II diastolic dysfunction (pseudonormalization). Elevated left ventricular end-diastolic pressure.  2. Right ventricular systolic function is normal. The right ventricular size is normal.  3. No color flow done on atrial septum in sub costal views.  4. The mitral valve is abnormal. No evidence of mitral valve regurgitation. No evidence of mitral stenosis.  5. CW doppler of AV not adequate prior echo 03/27/22 with moderate AS mean gradient 26.4 mmhg peak 52.5 mmhg AVA 1.1 cm2 and DVI 0.34 would have tech return for better attempt at  gradients Likely still moderate AS CHAT message sent to tech in Epic. The aortic valve is tricuspid. Aortic valve regurgitation is not visualized. See comments.  6. The inferior vena cava is dilated in size with >50% respiratory variability, suggesting right atrial pressure of 8 mmHg. FINDINGS  Left Ventricle: Left ventricular ejection fraction, by estimation, is 60 to 65%. The left ventricle has normal function. The left ventricle has no regional wall motion abnormalities. Definity contrast agent was given IV to delineate the left ventricular  endocardial borders. The left  ventricular internal cavity size was normal in size. There is moderate left ventricular hypertrophy. Left ventricular diastolic parameters are consistent with Grade II diastolic dysfunction (pseudonormalization). Elevated left ventricular end-diastolic pressure. Right Ventricle: The right ventricular size is normal. No increase in right ventricular wall thickness. Right ventricular systolic function is normal. Left Atrium: Left atrial size was not well visualized. Right Atrium: Right atrial size was normal in size. Pericardium: There is no evidence of pericardial effusion. Mitral Valve: The mitral valve is abnormal. There is mild thickening of the mitral valve leaflet(s). There is mild calcification of the mitral valve leaflet(s). No evidence of mitral valve regurgitation. No evidence of mitral valve stenosis. Tricuspid Valve: The tricuspid valve is normal in structure. Tricuspid valve regurgitation is not demonstrated. No evidence of tricuspid stenosis. Aortic Valve: CW doppler of AV not adequate prior echo 03/27/22 with moderate AS mean gradient 26.4 mmhg peak 52.5 mmhg AVA 1.1 cm2 and DVI 0.34 would have tech return for better attempt at gradients Likely still moderate AS CHAT message sent to tech in Epic. The aortic valve is tricuspid. Aortic valve regurgitation is not visualized. See comments. Pulmonic Valve: The pulmonic valve was normal in structure. Pulmonic valve regurgitation is not visualized. No evidence of pulmonic stenosis. Aorta: The aortic root is normal in size and structure. Venous: The inferior vena cava is dilated in size with greater than 50% respiratory variability, suggesting right atrial pressure of 8 mmHg. IAS/Shunts: No atrial level shunt detected by color flow Doppler.  LEFT VENTRICLE PLAX 2D LVIDd:         6.20 cm   Diastology LVIDs:         4.40 cm   LV e' medial:    7.89 cm/s LV PW:         1.30 cm   LV E/e' medial:  20.2 LV IVS:        1.60 cm   LV e' lateral:   9.90 cm/s LVOT diam:      2.30 cm   LV E/e' lateral: 16.1 LV SV:         80 LV SV Index:   35 LVOT Area:     4.15 cm  RIGHT VENTRICLE RV S prime:     15.70 cm/s TAPSE (M-mode): 2.7 cm LEFT ATRIUM         Index LA diam:    3.80 cm 1.67 cm/m  AORTIC VALVE LVOT Vmax:   130.67 cm/s LVOT Vmean:  83.800 cm/s LVOT VTI:    0.193 m  AORTA Ao Root diam: 3.10 cm Ao Asc diam:  3.60 cm MITRAL VALVE MV Area (PHT): 4.80 cm     SHUNTS MV Decel Time: 158 msec     Systemic VTI:  0.19 m MV E velocity: 159.00 cm/s  Systemic Diam: 2.30 cm MV A velocity: 89.40 cm/s MV E/A ratio:  1.78 Jenkins Rouge MD Electronically signed by Jenkins Rouge MD Signature Date/Time: 07/08/2022/11:58:52 AM  Final     Cardiac Studies   Echocardiogram as above, normal ejection fraction, previously moderate aortic stenosis.  Patient Profile     77 y.o. male with GI bleed, 3 unit transfusion, elevated troponin 4100, prior gastric AVMs, normal ejection fraction, moderate aortic stenosis, Xarelto for chronic anticoagulation.  Assessment & Plan    Acute GI bleed -Getting colonoscopy today.  Dr. Benson Norway.  3 unit transfusion.  Type 2 MI -Troponin elevation 4000.  He does not have any previously diagnosed coronary artery disease however his hemoglobin was 5.9 on admission.  This is likely a supply demand oxygen mismatch.  Could have underlying CAD however.  In looking back at his previous EKG, original 1 did show some evidence of diffuse ST segment depression which could be indicative of global ischemia.  Even though this may not represent plaque rupture, this does portend a worsened prognosis. - It would be helpful once stable to define coronary anatomy with cardiac catheterization.  If he remains chest pain-free, could consider this even as outpatient.  Atrial fibrillation paroxysmal - Holding on anticoagulation at this point.  Was previously on Xarelto. -Consider Watchman after discharge. -Currently well rate controlled.  Aortic stenosis - Previously considered  moderate in severity.  This is one of the reasons why think cardiac catheterization would be helpful.  If for instance he had underlying triple-vessel disease and required revascularization via bypass, aortic valve replacement would take place at that time.  His aortic stenosis is not severe however.  Occasionally severe aortic stenosis can be associated with recurrent GI bleeds and AV malformations known as Heyde's syndrome.  This condition has to do with degradation of von Willebrand's factor due to turbulent blood flow through aortic valve.  Acute on chronic diastolic heart failure - Continuing to monitor.        For questions or updates, please contact [place] Please consult [email or web address] for contact info under        Signed, Candee Furbish, MD  07/10/2022, 9:28 AM

## 2022-07-10 NOTE — Care Management Important Message (Signed)
Important Message  Patient Details  Name: Derrick Alexander MRN: [id number] Date of Birth: 09-10-1945   Medicare Important Message Given:  Yes     [name] Montine Circle 07/10/2022, 3:33 PM

## 2022-07-10 NOTE — Progress Notes (Signed)
Pt refusing to use hospital CPAP. Stated he will wear 02 for the night.

## 2022-07-10 NOTE — Op Note (Signed)
[facility] Patient Name: Derrick Alexander Procedure Date : 07/10/2022 MRN: [id number] Attending MD: Carol Ada , MD, [id number] Date of Birth: 06/10/1945 CSN: [id number] Age: 77 Admit Type: Inpatient Procedure:                Colonoscopy Indications:              Heme positive stool, Melena Providers:                Carol Ada, MD, Jamison Neighbor RN, RN, Benetta Spar, Technician Referring MD:              Medicines:                Propofol per Anesthesia Complications:            No immediate complications. Estimated Blood Loss:     Estimated blood loss: none. Procedure:                Pre-Anesthesia Assessment:                           - Prior to the procedure, a History and Physical                            was performed, and patient medications and                            allergies were reviewed. The patient's tolerance of                            previous anesthesia was also reviewed. The risks                            and benefits of the procedure and the sedation                            options and risks were discussed with the patient.                            All questions were answered, and informed consent                            was obtained. Prior Anticoagulants: The patient has                            taken no anticoagulant or antiplatelet agents. ASA                            Grade Assessment: III - A patient with severe                            systemic disease. After reviewing the risks and  benefits, the patient was deemed in satisfactory                            condition to undergo the procedure.                           - Sedation was administered by an anesthesia                            professional. Deep sedation was attained.                           After obtaining informed consent, the colonoscope                            was passed under direct vision.  Throughout the                            procedure, the patient's blood pressure, pulse, and                            oxygen saturations were monitored continuously. The                            [id number] Olympus coloscope was                            introduced through the anus and advanced to the the                            cecum, identified by appendiceal orifice and                            ileocecal valve. The colonoscopy was performed                            without difficulty. The patient tolerated the                            procedure well. The quality of the bowel                            preparation was evaluated using the BBPS [facility]                            Bowel Preparation Scale) with scores of: Right                            Colon = 2 (minor amount of residual staining, small                            fragments of stool and/or opaque liquid, but mucosa  seen well), Transverse Colon = 3 (entire mucosa                            seen well with no residual staining, small                            fragments of stool or opaque liquid) and Left Colon                            = 3 (entire mucosa seen well with no residual                            staining, small fragments of stool or opaque                            liquid). The total BBPS score equals 8. The quality                            of the bowel preparation was good. The ileocecal                            valve, appendiceal orifice, and rectum were                            photographed. Scope In: 12:21:04 PM Scope Out: 12:37:01 PM Scope Withdrawal Time: 0 hours 10 minutes 47 seconds  Total Procedure Duration: 0 hours 15 minutes 57 seconds  Findings:      Multiple twenty mm ulcers were found in the ascending colon and in the       cecum. No bleeding was present. No stigmata of recent bleeding were       seen. Biopsies were taken with a cold forceps  for histology.      A 3 mm polyp was found in the descending colon. The polyp was sessile.       The polyp was removed with a cold snare. Resection and retrieval were       complete.      A few small-mouthed diverticula were found in the sigmoid colon.      In the cecum and ascending colon a patchy colitis in the form of       erosions and superficial ulcerations were noted. The largest ulceration       was around 2 cm and it ICV portion of the cecal cap. Multiple biopsies       were obtained. Impression:               - Multiple ulcers in the ascending colon and in the                            cecum. Biopsied.                           - One 3 mm polyp in the descending colon, removed                            with a  cold snare. Resected and retrieved.                           - Diverticulosis in the sigmoid colon. Recommendation:           - Return patient to hospital ward for ongoing care.                           - Resume regular diet.                           - Continue present medications.                           - Await pathology results.                           - Continue to hold anticoagulation. Procedure Code(s):        --- Professional ---                           [phone], Colonoscopy, flexible; with removal of                            tumor(s), polyp(s), or other lesion(s) by snare                            technique                           45380, 41, Colonoscopy, flexible; with biopsy,                            single or multiple Diagnosis Code(s):        --- Professional ---                           K63.3, Ulcer of intestine                           D12.4, Benign neoplasm of descending colon                           R19.5, Other fecal abnormalities                           K92.1, Melena (includes Hematochezia)                           K57.30, Diverticulosis of large intestine without                            perforation or abscess without  bleeding CPT copyright 2022 American [employer] Association. All rights reserved. The codes documented in this report are preliminary and upon coder review may  be revised to meet current compliance requirements. Carol Ada, MD Carol Ada, MD 07/10/2022 12:44:58 PM This report has been signed electronically. Number of Addenda: 0

## 2022-07-10 NOTE — Anesthesia Preprocedure Evaluation (Signed)
Anesthesia Evaluation  Patient identified by MRN, date of birth, ID band Patient awake    Reviewed: Allergy & Precautions, NPO status , Patient's Chart, lab work & pertinent test results  Airway Mallampati: III  TM Distance: >3 FB Neck ROM: Full    Dental  (+) Edentulous Upper, Edentulous Lower, Dental Advisory Given   Pulmonary sleep apnea and Continuous Positive Airway Pressure Ventilation , COPD, former smoker   Pulmonary exam normal breath sounds clear to auscultation       Cardiovascular hypertension, Pt. on medications +CHF and + DOE  Normal cardiovascular exam+ dysrhythmias (xarelto) Atrial Fibrillation and Ventricular Tachycardia + Valvular Problems/Murmurs (mod AS) AS  Rhythm:Regular Rate:Normal  EKG LBBB  TTE 2023  1. Left ventricular ejection fraction, by estimation, is 60 to 65%. The  left ventricle has normal function. The left ventricle has no regional  wall motion abnormalities. There is moderate left ventricular hypertrophy.  Left ventricular diastolic  parameters are consistent with Grade II diastolic dysfunction  (pseudonormalization). Elevated left ventricular end-diastolic pressure.  The E/e' is 61.   2. Right ventricular systolic function is normal. The right ventricular  size is normal. There is mildly elevated pulmonary artery systolic  pressure. The estimated right ventricular systolic pressure is [id number] mmHg.   3. The mitral valve is abnormal. Trivial mitral valve regurgitation.   4. The aortic valve is calcified. Aortic valve regurgitation is not  visualized. Moderate aortic valve stenosis. Aortic valve area, by VTI  measures 1.06 cm. Aortic valve mean gradient measures 26.4 mmHg. Aortic  valve Vmax measures 3.62 m/s.   5. The inferior vena cava is normal in size with <50% respiratory  variability, suggesting right atrial pressure of 8 mmHg.     Neuro/Psych negative neurological ROS  negative psych  ROS   GI/Hepatic negative GI ROS, Neg liver ROS,,,  Endo/Other    Morbid obesity (BMI 40)  Renal/GU negative Renal ROS  negative genitourinary   Musculoskeletal negative musculoskeletal ROS (+)    Abdominal  (+) + obese  Peds  Hematology  (+) Blood dyscrasia, anemia Lab Results      Component                Value               Date                      WBC                      15.5 (H)            07/07/2022                HGB                      8.2 (L)             07/07/2022                HCT                      24.5 (L)            07/07/2022                MCV                      91.1  07/07/2022                PLT                      152                 07/07/2022              Anesthesia Other Findings PMH of bleeding gastric AVMs (EGD with APC on 04/28/2022), CHF, afib, and COPD   Reproductive/Obstetrics                             Anesthesia Physical Anesthesia Plan  ASA: 3  Anesthesia Plan: MAC   Post-op Pain Management: Minimal or no pain anticipated   Induction: Intravenous  PONV Risk Score and Plan: 1 and Propofol infusion and Treatment may vary due to age or medical condition  Airway Management Planned: Natural Airway  Additional Equipment:   Intra-op Plan:   Post-operative Plan:   Informed Consent: I have reviewed the patients History and Physical, chart, labs and discussed the procedure including the risks, benefits and alternatives for the proposed anesthesia with the patient or authorized representative who has indicated his/her understanding and acceptance.     Dental advisory given  Plan Discussed with: CRNA  Anesthesia Plan Comments:        Anesthesia Quick Evaluation

## 2022-07-10 NOTE — Progress Notes (Signed)
PROGRESS NOTE  Derrick Alexander [id number] DOB: Oct 18, 1945   PCP: [name], MD  Patient is from: Home.  Lives with family.  DOA: 07/06/2022 LOS: 3  Chief complaints Chief Complaint  Patient presents with   Shortness of Breath     Brief Narrative / Interim history: 77 year old M with PMH of A-fib on Xarelto, GIB/gastric AVMs, OSA on CPAP, diastolic CHF, COPD, morbid obesity, HTN and HLD presenting with melena for about a week, and admitted for symptomatic anemia due to acute upper GI bleed, elevated troponin and some respiratory distress.  Patient was hospitalized 04/27/2022-04/30/2022 for acute upper GI bleed with gastric AVM that was treated with APC.  He reported vague chest pain.  Troponin 796 and trended up to 3612.  EKG with some ST depressions in lateral leads which appears to be new.  2 units of blood ordered.  GI consulted and patient was admitted.   The next day, troponin continued to rise although patient did not have chest pain.  Cardiology consulted.   EGD on 2/16 showed erythematous gastric mucosa.  Colonoscopy on 2/19 showed multiple ulcers in ascending colon and cecum (biopsy), one 3 mm polyp in descending colon (resected and retrieved) and diverticulosis in sigmoid colon.  GI recommended resuming regular diet, continuing current medication, awaiting pathology and holding anticoagulation.  Subjective: Seen and examined earlier this morning before he went for colonoscopy.  No major events overnight of this morning.  Per wife, he had a good night of sleep last night.  Denies chest pain, dyspnea or lightheadedness.  Denies blood in the stool during bowel prep.  Objective: Vitals:   07/09/22 0812 07/09/22 0903 07/09/22 1237 07/09/22 1600  BP:  (!) 133/59 110/69 (!) 154/71  Pulse:  84 69 99  Resp:  17 (!) 23 20  Temp:  97.8 F (36.6 C) 98 F (36.7 C)   TempSrc:  Oral Oral   SpO2: 100% 100% 96% 97%  Weight:      Height:        Examination: .   GENERAL: No  apparent distress.  Nontoxic. HEENT: MMM.  Vision and hearing grossly intact.  NECK: Supple.  Difficult to assess JVD due to body habitus RESP: On RA.  No IWOB.  Fair aeration bilaterally. CVS:  RRR.  2/6 SEM over RUSB ABD/GI/GU: BS+. Abd soft, NTND.  MSK/EXT:   No apparent deformity. Moves extremities. No edema.  SKIN: no apparent skin lesion or wound NEURO: Awake and alert. Oriented appropriately.  No apparent focal neuro deficit. PSYCH: Calm. Normal affect.   Procedures:  2/16-EGD with erythematous gastric mucosa. 2/19-colonoscopy showed multiple ulcers in ascending colon and cecum (biopsy), one 3 mm polyp in descending colon (resected and retrieved) and diverticulosis in sigmoid colon.    Microbiology summarized: [id number], influenza and RSV PCR nonreactive.  Assessment and plan: Principal Problem:   Acute GI bleeding Active Problems:   Chronic obstructive pulmonary disease (HCC)   Paroxysmal atrial fibrillation with RVR (HCC)   Morbid obesity with BMI of 40.0-44.9, adult (HCC)   Essential hypertension   Paroxysmal atrial fibrillation (HCC)   OSA on CPAP   Anemia due to gastrointestinal blood loss   Glaucoma (increased eye pressure)   Symptomatic anemia   Chronic diastolic CHF (congestive heart failure) (HCC)   Elevated troponin   Abnormal EKG   Chronic obstructive pulmonary disease (COPD) (HCC)   Anticoagulated  Acute on chronic blood loss anemia due to acute GI bleeding: history of gastric AVMs that was treated with  APC in 04/2022.  EGD and colonoscopy as above.  He is on Xarelto for atrial fibrillation.  Transfused 4 units so far with some improvement in Hgb.  Anemia panel with iron deficiency. Recent Labs    04/29/22 2156 04/30/22 0535 05/05/22 0956 07/06/22 0904 07/06/22 1849 07/07/22 0126 07/07/22 0932 07/08/22 0439 07/08/22 1742 07/09/22 0503  HGB 8.4* 7.9* 7.8* 5.9* 7.8* 6.7* 8.2* 7.9* 8.5* 8.1*  -Continue IV Protonix -Maintain 2 PIV -Monitor H&H and  transfuse for Hgb < 8.  -IV ferric gluconate 250 mg x 2.  Risk and benefit discussed with patient and wife. -GI: Resume regular diet, continue current meds, follow-up pathology and hold anticoagulation  Acute on chronic diastolic CHF/moderate aortic stenosis: TTE with LVEF of 60 to 65%, G2-DD and mod AS. BNP slightly up likely in the setting of anemia, IV fluid and blood transfusion.  Feels his breathing is labored.  Grossly euvolemic but difficult exam due to body habitus. -Cardiology following-intermittently diuresing. -Strict intake and output, daily weights and renal functions.   Elevated troponin/abnormal EKG: Troponin 800>> 3600>> 4100>>.  EKG with some ST depression in lateral leads. Reported vague chest pain on presentation.  Currently chest pain-free but dyspnea.  TTE as above.  Per cardiology, demand ischemia versus ACS. -GI recommended holding anticoagulation. -Cardiology following.   Paroxysmal atrial fibrillation and SVT: Currently in sinus rhythm with PVCs.  Does not seem to be on rate or rhythm control medications at home.  CHA2DS2-VASc score > 4. -Started on metoprolol 12.5 mg twice daily -Holding anticoagulation per GI recommendation  Chronic COPD -Continue on inhalers -Continue Xopenex as needed  Essential hypertension: Normotensive for most part but unreliable since it is obtained on his legs. -Metoprolol as above. -Continue holding irbesartan, spironolactone for now  OSA reportedly compliant with CPAP at home. -Encouraged to use CPAP instead of oxygen.  Hyponatremia: Mild. -Continue monitoring  Leukocytosis: Likely demargination.  Resolved. -Continue monitoring  Hyperlipidemia -Continue rosuvastatin   Glaucoma -Continue latanoprost   Morbid obesity Body mass index is 43.52 kg/m.           DVT prophylaxis:  SCDs Start: 07/06/22 1310  Code Status: Full code Family Communication: Updated patient's wife at the bedside. Level of care:  Progressive Status is: Inpatient Remains inpatient appropriate because: Blood loss anemia and elevated troponin   Final disposition: TBD Consultants:  Cardiology Gastroenterology   55 minutes with more than 50% spent in reviewing records, counseling patient/family and coordinating care.   Sch Meds:  Scheduled Meds:  latanoprost  1 drop Both Eyes QHS   metoprolol tartrate  12.5 mg Oral BID   mometasone-formoterol  2 puff Inhalation BID   pantoprazole (PROTONIX) IV  40 mg Intravenous Q12H   peg 3350 powder  0.5 kit Oral Once   And   [START ON 07/10/2022] peg 3350 powder  0.5 kit Oral Once   rosuvastatin  20 mg Oral Daily   sodium chloride flush  3 mL Intravenous Q12H   Continuous Infusions:   PRN Meds:.acetaminophen **OR** acetaminophen, albuterol, hydrALAZINE, polyvinyl alcohol  Antimicrobials: Anti-infectives (From admission, onward)    None        I have personally reviewed the following labs and images: CBC: Recent Labs  Lab 07/06/22 0904 07/06/22 1849 07/07/22 0126 07/07/22 0932 07/08/22 0439 07/08/22 1742 07/09/22 0503  WBC 13.6* 17.0*  --  15.5* 16.0*  --  13.7*  NEUTROABS  --   --   --   --  13.1*  --   --  HGB 5.9* 7.8* 6.7* 8.2* 7.9* 8.5* 8.1*  HCT 19.7* 23.4* 21.1* 24.5* 24.3* 25.5* 25.5*  MCV 96.1 90.7  --  91.1 91.7  --  91.4  PLT 181 166  --  152 159  --  171   BMP &GFR Recent Labs  Lab 07/06/22 0904 07/07/22 0932 07/08/22 0439 07/09/22 0503  NA 135 135 134* 132*  K 4.8 4.1 4.0 3.7  CL 104 106 101 98  CO2 19* 22 22 22  $ GLUCOSE 139* 142* 123* 116*  BUN 36* 28* 27* 26*  CREATININE 1.19 0.85 1.04 1.06  CALCIUM 8.5* 8.5* 8.7* 8.6*  MG  --   --  2.2 2.5*  PHOS  --   --  3.7 3.1   Estimated Creatinine Clearance: 72 mL/min (by C-G formula based on SCr of 1.06 mg/dL). Liver & Pancreas: Recent Labs  Lab 07/06/22 0904 07/08/22 0439 07/09/22 0503  AST 38  --   --   ALT 12  --   --   ALKPHOS 39  --   --   BILITOT 0.6  --   --    PROT 5.0*  --   --   ALBUMIN 3.1* 3.0* 3.0*   No results for input(s): "LIPASE", "AMYLASE" in the last 168 hours. No results for input(s): "AMMONIA" in the last 168 hours. Diabetic: No results for input(s): "HGBA1C" in the last 72 hours. No results for input(s): "GLUCAP" in the last 168 hours. Cardiac Enzymes: No results for input(s): "CKTOTAL", "CKMB", "CKMBINDEX", "TROPONINI" in the last 168 hours. Recent Labs    05/05/22 0956  PROBNP 756*   Coagulation Profile: No results for input(s): "INR", "PROTIME" in the last 168 hours. Thyroid Function Tests: No results for input(s): "TSH", "T4TOTAL", "FREET4", "T3FREE", "THYROIDAB" in the last 72 hours. Lipid Profile: Recent Labs    07/08/22 0439  CHOL 77  HDL 28*  LDLCALC 25  TRIG 119  CHOLHDL 2.8   Anemia Panel: No results for input(s): "VITAMINB12", "FOLATE", "FERRITIN", "TIBC", "IRON", "RETICCTPCT" in the last 72 hours. Urine analysis: No results found for: "COLORURINE", "APPEARANCEUR", "LABSPEC", "PHURINE", "GLUCOSEU", "HGBUR", "BILIRUBINUR", "KETONESUR", "PROTEINUR", "UROBILINOGEN", "NITRITE", "LEUKOCYTESUR" Sepsis Labs: Invalid input(s): "PROCALCITONIN", "LACTICIDVEN"  Microbiology: Recent Results (from the past 240 hour(s))  Resp panel by RT-PCR (RSV, Flu A&B, Covid) Anterior Nasal Swab     Status: None   Collection Time: 07/06/22  8:08 AM   Specimen: Anterior Nasal Swab  Result Value Ref Range Status   SARS Coronavirus 2 by RT PCR NEGATIVE NEGATIVE Final   Influenza A by PCR NEGATIVE NEGATIVE Final   Influenza B by PCR NEGATIVE NEGATIVE Final    Comment: (NOTE) The Xpert Xpress SARS-CoV-2/FLU/RSV plus assay is intended as an aid in the diagnosis of influenza from Nasopharyngeal swab specimens and should not be used as a sole basis for treatment. Nasal washings and aspirates are unacceptable for Xpert Xpress SARS-CoV-2/FLU/RSV testing.  Fact Sheet for Patients: EntrepreneurPulse.com.au  Fact  Sheet for Healthcare Providers: IncredibleEmployment.be  This test is not yet approved or cleared by the [place] FDA and has been authorized for detection and/or diagnosis of SARS-CoV-2 by FDA under an Emergency Use Authorization (EUA). This EUA will remain in effect (meaning this test can be used) for the duration of the COVID-19 declaration under Section 564(b)(1) of the Act, 21 U.S.C. section 360bbb-3(b)(1), unless the authorization is terminated or revoked.     Resp Syncytial Virus by PCR NEGATIVE NEGATIVE Final    Comment: (NOTE) Fact Sheet for  Patients: EntrepreneurPulse.com.au  Fact Sheet for Healthcare Providers: IncredibleEmployment.be  This test is not yet approved or cleared by the [place] FDA and has been authorized for detection and/or diagnosis of SARS-CoV-2 by FDA under an Emergency Use Authorization (EUA). This EUA will remain in effect (meaning this test can be used) for the duration of the COVID-19 declaration under Section 564(b)(1) of the Act, 21 U.S.C. section 360bbb-3(b)(1), unless the authorization is terminated or revoked.  Performed at [facility], [place] [address] [place], [place] [address]     Radiology Studies: No results found.    Alton Tremblay T. [place]  If 7PM-7AM, please contact night-coverage [email or web address] 07/09/2022, 4:42 PM

## 2022-07-11 LAB — RENAL FUNCTION PANEL
Albumin: 2.9 g/dL — ABNORMAL LOW (ref 3.5–5.0)
Anion gap: 10 (ref 5–15)
BUN: 17 mg/dL (ref 8–23)
CO2: 23 mmol/L (ref 22–32)
Calcium: 8.7 mg/dL — ABNORMAL LOW (ref 8.9–10.3)
Chloride: 101 mmol/L (ref 98–111)
Creatinine, Ser: 1.03 mg/dL (ref 0.61–1.24)
GFR, Estimated: >60 mL/min (ref >60)
Glucose, Bld: 109 mg/dL — ABNORMAL HIGH (ref 70–99)
Phosphorus: 3.3 mg/dL (ref 2.5–4.6)
Potassium: 4.4 mmol/L (ref 3.5–5.1)
Sodium: 134 mmol/L — ABNORMAL LOW (ref 135–145)

## 2022-07-11 LAB — CBC
HCT: 25.1 % — ABNORMAL LOW (ref 39.0–52.0)
Hemoglobin: 8.1 g/dL — ABNORMAL LOW (ref 13.0–17.0)
MCH: 29.2 pg (ref 26.0–34.0)
MCHC: 32.3 g/dL (ref 30.0–36.0)
MCV: 90.6 fL (ref 80.0–100.0)
Platelets: 200 10*3/uL (ref 150–400)
RBC: 2.77 MIL/uL — ABNORMAL LOW (ref 4.22–5.81)
RDW: 15.5 % (ref 11.5–15.5)
WBC: 10.6 10*3/uL — ABNORMAL HIGH (ref 4.0–10.5)
nRBC: 0.2 % (ref 0.0–0.2)

## 2022-07-11 LAB — MAGNESIUM: Magnesium: 2.4 mg/dL (ref 1.7–2.4)

## 2022-07-11 LAB — SURGICAL PATHOLOGY

## 2022-07-11 NOTE — Evaluation (Signed)
Physical Therapy Evaluation Patient Details Name: Derrick Alexander MRN: [id number] DOB: 05/04/1946 Today's Date: 07/11/2022  History of Present Illness  Pt is a 77 y/o male admitted with GI bleed and anemia. Pt required blood transfusions and underwent EGD on 2/16 showing superficial ulcerations/erosions of cecal and ascending colon. Had colonoscopy 2/19 showed multiple ulcers in ascending colon and cecum (biopsy), one 3 mm polyp in descending colon (resected and retrieved) and diverticulosis in sigmoid colon.   PMH: a fib, COPD, HTN, tobacco use, syncope.   Clinical Impression  Patient received in bed, wife at bedside. He is agreeable to PT assessment. Patient is mod I with bed mobility. Transfers with mod I. Ambulated 90 feet with no AD and supervision. 1 standing rest break due to sob. O2 sats on room air remained > 88% throughout session. He will continue to benefit from skilled PT to improve activity tolerance.          Recommendations for follow up therapy are one component of a multi-disciplinary discharge planning process, led by the attending physician.  Recommendations may be updated based on patient status, additional functional criteria and insurance authorization.  Follow Up Recommendations Home health PT      Assistance Recommended at Discharge Intermittent Supervision/Assistance  Patient can return home with the following  A little help with walking and/or transfers;A little help with bathing/dressing/bathroom;Help with stairs or ramp for entrance;Assistance with cooking/housework    Equipment Recommendations None recommended by PT  Recommendations for Other Services       Functional Status Assessment Patient has had a recent decline in their functional status and demonstrates the ability to make significant improvements in function in a reasonable and predictable amount of time.     Precautions / Restrictions Precautions Precautions: Fall Precaution Comments: DOE, O2 sats  stable in 90%s during mobility on room air Restrictions Weight Bearing Restrictions: No      Mobility  Bed Mobility Overal bed mobility: Modified Independent             General bed mobility comments: increased time/effort, use of bedrails    Transfers Overall transfer level: Modified independent Equipment used: None Transfers: Sit to/from Stand Sit to Stand: Modified independent (Device/Increase time)                Ambulation/Gait Ambulation/Gait assistance: Supervision Gait Distance (Feet): 90 Feet Assistive device: None Gait Pattern/deviations: Step-through pattern, Wide base of support Gait velocity: decr     General Gait Details: patient ambulates well without AD. Limited by dyspnea. Required standing rest after ~45 feet due to this.  Stairs            Wheelchair Mobility    Modified Rankin (Stroke Patients Only)       Balance Overall balance assessment: Mild deficits observed, not formally tested Sitting-balance support: Feet supported Sitting balance-Leahy Scale: Normal     Standing balance support: During functional activity, No upper extremity supported Standing balance-Leahy Scale: Good                               Pertinent Vitals/Pain Pain Assessment Pain Assessment: No/denies pain    Home Living Family/patient expects to be discharged to:: Private residence Living Arrangements: Spouse/significant other Available Help at Discharge: Family;Available 24 hours/day Type of Home: House Home Access: Stairs to enter   [employer] of Steps: 3 or 5   Home Layout: One level Home Equipment: Cane - single point;Rollator (4  wheels);Shower seat      Prior Function Prior Level of Function : Independent/Modified Independent;Driving             Mobility Comments: no AD typically. recently more SOB with activity ADLs Comments: IND with ADLs, has shower chair that he can use.     Hand Dominance    Dominant Hand: Right    Extremity/Trunk Assessment   Upper Extremity Assessment Upper Extremity Assessment: Defer to OT evaluation    Lower Extremity Assessment Lower Extremity Assessment: Overall WFL for tasks assessed    Cervical / Trunk Assessment Cervical / Trunk Assessment: Normal  Communication   Communication: No difficulties  Cognition Arousal/Alertness: Awake/alert Behavior During Therapy: WFL for tasks assessed/performed Overall Cognitive Status: Within Functional Limits for tasks assessed                                          General Comments General comments (skin integrity, edema, etc.): Wife at bedside, supportive    Exercises     Assessment/Plan    PT Assessment Patient needs continued PT services  PT Problem List Decreased activity tolerance;Decreased mobility;Cardiopulmonary status limiting activity       PT Treatment Interventions Gait training;Stair training;Therapeutic activities;Functional mobility training;Patient/family education;Therapeutic exercise    PT Goals (Current goals can be found in the Care Plan section)  Acute Rehab PT Goals Patient Stated Goal: to go home soon PT Goal Formulation: With patient/family Time For Goal Achievement: 07/18/22 Potential to Achieve Goals: Good    Frequency Min 3X/week     Co-evaluation               AM-PAC PT "6 Clicks" Mobility  Outcome Measure Help needed turning from your back to your side while in a flat bed without using bedrails?: A Little Help needed moving from lying on your back to sitting on the side of a flat bed without using bedrails?: A Little Help needed moving to and from a bed to a chair (including a wheelchair)?: A Little Help needed standing up from a chair using your arms (e.g., wheelchair or bedside chair)?: A Little Help needed to walk in hospital room?: A Little Help needed climbing 3-5 steps with a railing? : A Little 6 Click Score: 18    End of  Session   Activity Tolerance: Other (comment) (limited by dyspnea) Patient left: in bed;with call bell/phone within reach;with family/visitor present Nurse Communication: Mobility status PT Visit Diagnosis: Difficulty in walking, not elsewhere classified (R26.2)    Time: [id number] PT Time Calculation (min) (ACUTE ONLY): 12 min   Charges:   PT Evaluation $PT Eval Moderate Complexity: 1 Mod          Elara Cocke, PT, GCS 07/11/22,2:39 PM

## 2022-07-11 NOTE — Progress Notes (Signed)
Pt doesn't want to wear hospital CPAP machine.

## 2022-07-11 NOTE — Progress Notes (Signed)
PROGRESS NOTE  Derrick Alexander [id number] DOB: [birth year]   PCP: [name], MD  Patient is from: Home.  Lives with family.  DOA: 07/06/2022 LOS: 5  Chief complaints Chief Complaint  Patient presents with   Shortness of Breath     Brief Narrative / Interim history: 77 year old M with PMH of A-fib on Xarelto, GIB/gastric AVMs, OSA on CPAP, diastolic CHF, COPD, morbid obesity, HTN and HLD presenting with melena for about a week, and admitted for symptomatic anemia due to acute upper GI bleed, elevated troponin and some respiratory distress.  Patient was hospitalized 04/27/2022-04/30/2022 for acute upper GI bleed with gastric AVM that was treated with APC.  He reported vague chest pain.  Troponin 796 and trended up to 3612.  EKG with some ST depressions in lateral leads which appears to be new.  2 units of blood ordered.  GI consulted and patient was admitted.   The next day, troponin continued to rise although patient did not have chest pain.  Cardiology consulted.   EGD on 2/16 showed erythematous gastric mucosa.  Colonoscopy on 2/19 showed multiple ulcers in ascending colon and cecum (biopsy), one 3 mm polyp in descending colon (resected and retrieved) and diverticulosis in sigmoid colon.  GI recommended resuming regular diet, continuing current medication, awaiting pathology and holding anticoagulation.  GI and cardiology following.  Subjective: Seen and examined earlier this morning.  No major events overnight of this morning.  Felt warm in his abdomen after IV iron last night.  Wife noted apnea and gasping for air when he wakes up.  He has been refusing to wear CPAP.  He denies chest pain.  Reports watery bowel movement without blood.  Still with dyspnea on exertion walking to the bathroom and back.  He is currently on room air.  Objective: Vitals:   07/11/22 0500 07/11/22 0808 07/11/22 0840 07/11/22 1156  BP:   (!) 143/83 (!) 118/97  Pulse:   82 81  Resp:   20 20  Temp:   98 F  (36.7 C)   TempSrc:   Oral   SpO2:  95% 96% 98%  Weight: 124.4 kg     Height:        Examination:  GENERAL: No apparent distress.  Nontoxic. HEENT: MMM.  Vision and hearing grossly intact.  NECK: Supple.  Difficult to assess JVD due to body habitus. RESP:  No IWOB.  Fair aeration bilaterally but limited exam due to body habitus. CVS:  RRR. Heart sounds normal.  ABD/GI/GU: BS+. Abd soft, NTND but limited exam due to body habitus.  MSK/EXT:   No apparent deformity. Moves extremities. No edema.  SKIN: no apparent skin lesion or wound NEURO: Awake and alert. Oriented fairly.  No apparent focal neuro deficit. PSYCH: Calm. Normal affect.   Procedures:  2/16-EGD with erythematous gastric mucosa. 2/19-colonoscopy showed multiple ulcers in ascending colon and cecum (biopsy), one 3 mm polyp in descending colon (resected and retrieved) and diverticulosis in sigmoid colon.    Microbiology summarized: [id number], influenza and RSV PCR nonreactive.  Assessment and plan: Principal Problem:   Acute GI bleeding Active Problems:   Chronic obstructive pulmonary disease (HCC)   Paroxysmal atrial fibrillation with RVR (HCC)   Morbid obesity with BMI of 40.0-44.9, adult (HCC)   Essential hypertension   Paroxysmal atrial fibrillation (HCC)   OSA on CPAP   Anemia due to gastrointestinal blood loss   Glaucoma (increased eye pressure)   Symptomatic anemia   Acute on chronic diastolic CHF (congestive  heart failure) (HCC)   Elevated troponin   Abnormal EKG   Chronic obstructive pulmonary disease (COPD) (HCC)   Anticoagulated  Acute on chronic blood loss anemia due to acute GI bleeding: history of gastric AVMs that was treated with APC in 04/2022.  EGD and colonoscopy as above.  He is on Xarelto for atrial fibrillation.  Transfused 4 units with some improvement in Hgb.  Anemia panel with iron deficiency. Recent Labs    05/05/22 0956 07/06/22 0904 07/06/22 1849 07/07/22 0126 07/07/22 0932  07/08/22 0439 07/08/22 1742 07/09/22 0503 07/10/22 0428 07/11/22 0259  HGB 7.8* 5.9* 7.8* 6.7* 8.2* 7.9* 8.5* 8.1* 7.9* 8.1*  -Maintain 2 PIV -Continue IV PPI -Monitor H&H and transfuse for Hgb < 8.  -Received IV ferric gluconate 250 mg x 2.  -Per GI, antibiotic coagulation can be resumed on 2/21 pending biopsy -Follow-up pathology from colonoscopy  Acute on chronic diastolic CHF/moderate aortic stenosis: TTE with LVEF of 60 to 65%, G2-DD and mod AS. BNP slightly up likely in the setting of anemia, IV fluid and blood transfusion.  Feels his breathing is labored.  Grossly euvolemic but difficult exam due to body habitus. -Cardiology following-intermittently diuresing. -Strict intake and output, daily weights and renal functions.   Elevated troponin/abnormal EKG: Troponin 800>> 3600>> 4100>>.  EKG with some ST depression in lateral leads. Reported vague chest pain on presentation.  Currently chest pain-free but dyspnea.  TTE as above.  Per cardiology, demand ischemia versus ACS. -Per GI, anticoagulation can be resumed on 2/21 pending biopsy results -Cardiology following.   Paroxysmal atrial fibrillation and SVT: Currently in sinus rhythm with PVCs.  Does not seem to be on rate or rhythm control medications at home.  CHA2DS2-VASc score > 4. -Started on metoprolol 12.5 mg twice daily -Continue holding anticoagulation today.  Might be resumed on 2/21 pending biopsy result per GI.  Chronic COPD -Continue on inhalers -Continue Xopenex as needed  Essential hypertension: Normotensive for most part but unreliable since it is obtained on his legs. -Metoprolol as above. -Continue holding irbesartan, spironolactone for now  OSA reportedly compliant with CPAP at home: Refused to wear CPAP here.  Witnessed apnea with paroxysmal dyspnea -Discussed the importance of wearing CPAP at night and when he for sleep.  He has agreed  Hyponatremia: Mild. -Continue monitoring  Leukocytosis: Likely  demargination.  Resolved. -Continue monitoring  Hyperlipidemia -Continue rosuvastatin   Glaucoma -Continue eyedrops.   Morbid obesity Body mass index is 44.27 kg/m. Nutrition Problem: Inadequate protein intake Etiology: other (see comment) (current diet order) Signs/Symptoms: other (comment) (CLD unable to meet estimated protein need) Interventions: Boost Breeze, Prostat, MVI   DVT prophylaxis:  Place and maintain sequential compression device Start: 07/09/22 1647 SCDs Start: 07/06/22 1310  Code Status: Full code Family Communication: Updated patient's wife at the bedside. Level of care: Progressive Status is: Inpatient Remains inpatient appropriate because: Blood loss anemia and elevated troponin   Final disposition: Home once cleared by cardiology and GI. Consultants:  Cardiology Gastroenterology   55 minutes with more than 50% spent in reviewing records, counseling patient/family and coordinating care.   Sch Meds:  Scheduled Meds:  (feeding supplement) PROSource Plus  30 mL Oral BID BM   feeding supplement  1 Container Oral BID BM   latanoprost  1 drop Both Eyes QHS   metoprolol tartrate  12.5 mg Oral BID   mometasone-formoterol  2 puff Inhalation BID   multivitamin with minerals  1 tablet Oral Daily   pantoprazole (PROTONIX)  IV  40 mg Intravenous Q12H   rosuvastatin  20 mg Oral Daily   sodium chloride flush  3 mL Intravenous Q12H   Continuous Infusions:  lactated ringers 10 mL/hr at 07/10/22 1126    PRN Meds:.acetaminophen **OR** acetaminophen, albuterol, hydrALAZINE, polyvinyl alcohol  Antimicrobials: Anti-infectives (From admission, onward)    None        I have personally reviewed the following labs and images: CBC: Recent Labs  Lab 07/07/22 0932 07/08/22 0439 07/08/22 1742 07/09/22 0503 07/10/22 0428 07/11/22 0259  WBC 15.5* 16.0*  --  13.7* 10.3 10.6*  NEUTROABS  --  13.1*  --   --   --   --   HGB 8.2* 7.9* 8.5* 8.1* 7.9* 8.1*  HCT  24.5* 24.3* 25.5* 25.5* 24.7* 25.1*  MCV 91.1 91.7  --  91.4 91.1 90.6  PLT 152 159  --  171 192 200   BMP &GFR Recent Labs  Lab 07/07/22 0932 07/08/22 0439 07/09/22 0503 07/10/22 0428 07/11/22 0259  NA 135 134* 132* 133* 134*  K 4.1 4.0 3.7 4.3 4.4  CL 106 101 98 100 101  CO2 22 22 22 24 23  $ GLUCOSE 142* 123* 116* 121* 109*  BUN 28* 27* 26* 21 17  CREATININE 0.85 1.04 1.06 1.03 1.03  CALCIUM 8.5* 8.7* 8.6* 8.5* 8.7*  MG  --  2.2 2.5*  --  2.4  PHOS  --  3.7 3.1  --  3.3   Estimated Creatinine Clearance: 74.8 mL/min (by C-G formula based on SCr of 1.03 mg/dL). Liver & Pancreas: Recent Labs  Lab 07/06/22 0904 07/08/22 0439 07/09/22 0503 07/11/22 0259  AST 38  --   --   --   ALT 12  --   --   --   ALKPHOS 39  --   --   --   BILITOT 0.6  --   --   --   PROT 5.0*  --   --   --   ALBUMIN 3.1* 3.0* 3.0* 2.9*   No results for input(s): "LIPASE", "AMYLASE" in the last 168 hours. No results for input(s): "AMMONIA" in the last 168 hours. Diabetic: No results for input(s): "HGBA1C" in the last 72 hours. No results for input(s): "GLUCAP" in the last 168 hours. Cardiac Enzymes: No results for input(s): "CKTOTAL", "CKMB", "CKMBINDEX", "TROPONINI" in the last 168 hours. Recent Labs    05/05/22 0956  PROBNP 756*   Coagulation Profile: No results for input(s): "INR", "PROTIME" in the last 168 hours. Thyroid Function Tests: No results for input(s): "TSH", "T4TOTAL", "FREET4", "T3FREE", "THYROIDAB" in the last 72 hours. Lipid Profile: No results for input(s): "CHOL", "HDL", "LDLCALC", "TRIG", "CHOLHDL", "LDLDIRECT" in the last 72 hours.  Anemia Panel: Recent Labs    07/10/22 0428  VITAMINB12 544  FOLATE 11.1  FERRITIN 21*  TIBC 384  IRON 17*  RETICCTPCT 6.8*   Urine analysis: No results found for: "COLORURINE", "APPEARANCEUR", "LABSPEC", "PHURINE", "GLUCOSEU", "HGBUR", "BILIRUBINUR", "KETONESUR", "PROTEINUR", "UROBILINOGEN", "NITRITE", "LEUKOCYTESUR" Sepsis  Labs: Invalid input(s): "PROCALCITONIN", "LACTICIDVEN"  Microbiology: Recent Results (from the past 240 hour(s))  Resp panel by RT-PCR (RSV, Flu A&B, Covid) Anterior Nasal Swab     Status: None   Collection Time: 07/06/22  8:08 AM   Specimen: Anterior Nasal Swab  Result Value Ref Range Status   SARS Coronavirus 2 by RT PCR NEGATIVE NEGATIVE Final   Influenza A by PCR NEGATIVE NEGATIVE Final   Influenza B by PCR NEGATIVE NEGATIVE Final  Comment: (NOTE) The Xpert Xpress SARS-CoV-2/FLU/RSV plus assay is intended as an aid in the diagnosis of influenza from Nasopharyngeal swab specimens and should not be used as a sole basis for treatment. Nasal washings and aspirates are unacceptable for Xpert Xpress SARS-CoV-2/FLU/RSV testing.  Fact Sheet for Patients: EntrepreneurPulse.com.au  Fact Sheet for Healthcare Providers: IncredibleEmployment.be  This test is not yet approved or cleared by the [place] FDA and has been authorized for detection and/or diagnosis of SARS-CoV-2 by FDA under an Emergency Use Authorization (EUA). This EUA will remain in effect (meaning this test can be used) for the duration of the COVID-19 declaration under Section 564(b)(1) of the Act, 21 U.S.C. section 360bbb-3(b)(1), unless the authorization is terminated or revoked.     Resp Syncytial Virus by PCR NEGATIVE NEGATIVE Final    Comment: (NOTE) Fact Sheet for Patients: EntrepreneurPulse.com.au  Fact Sheet for Healthcare Providers: IncredibleEmployment.be  This test is not yet approved or cleared by the [place] FDA and has been authorized for detection and/or diagnosis of SARS-CoV-2 by FDA under an Emergency Use Authorization (EUA). This EUA will remain in effect (meaning this test can be used) for the duration of the COVID-19 declaration under Section 564(b)(1) of the Act, 21 U.S.C. section 360bbb-3(b)(1), unless the  authorization is terminated or revoked.  Performed at [facility], [place] [address] [place], [place] [address]     Radiology Studies: No results found.    Josefita Weissmann T. [place]  If 7PM-7AM, please contact night-coverage [email or web address] 07/11/2022, 2:37 PM

## 2022-07-11 NOTE — Evaluation (Signed)
 Occupational Therapy Evaluation Patient Details Name: Derrick Alexander MRN: [id number] DOB: 05/28/1945 Today's Date: 07/11/2022   History of Present Illness Pt is a 77 y/o male admitted with GI bleed and anemia. Pt required blood transfusions and underwent EGD on 2/16 showing superficial ulcerations/erosions of cecal and ascending colon. PMH: a fib, COPD, HTN, tobacco use, syncope.   Clinical Impression   PTA, pt lives with spouse, typically Independent with ADLs and mobility without need for AD. Per spouse, pt has baseline DOE d/t a fib. Pt presents now fairly close to reported baseline, requiring no more than Min A for LB ADLs and min guard for mobility without AD. 3/4 DOE noted though VSS on RA. Collaborated w/ pt/spouse on assist at DC and energy conservation strategies. Anticipate no OT needs at DC and likely only one more acute OT session needed. Functionally appropriate for DC home once medically stable.      Recommendations for follow up therapy are one component of a multi-disciplinary discharge planning process, led by the attending physician.  Recommendations may be updated based on patient status, additional functional criteria and insurance authorization.   Follow Up Recommendations  No OT follow up     Assistance Recommended at Discharge PRN  Patient can return home with the following A little help with bathing/dressing/bathroom;Assistance with cooking/housework;Help with stairs or ramp for entrance    Functional Status Assessment  Patient has had a recent decline in their functional status and demonstrates the ability to make significant improvements in function in a reasonable and predictable amount of time.  Equipment Recommendations  None recommended by OT    Recommendations for Other Services       Precautions / Restrictions Precautions Precautions: Fall;Other (comment) Precaution Comments: monitor DOE Restrictions Weight Bearing Restrictions: No      Mobility  Bed Mobility Overal bed mobility: Modified Independent             General bed mobility comments: increased time/effort, use of bedrails    Transfers Overall transfer level: Needs assistance Equipment used: None Transfers: Sit to/from Stand Sit to Stand: Supervision           General transfer comment: popped up quick at bedside, no LOB noted and pt denied dizziness      Balance Overall balance assessment: Needs assistance Sitting-balance support: No upper extremity supported, Feet supported Sitting balance-Leahy Scale: Good     Standing balance support: During functional activity, No upper extremity supported Standing balance-Leahy Scale: Good                             ADL either performed or assessed with clinical judgement   ADL Overall ADL's : Needs assistance/impaired Eating/Feeding: Independent   Grooming: Supervision/safety;Standing   Upper Body Bathing: Set up;Sitting   Lower Body Bathing: Minimal assistance;Sit to/from stand   Upper Body Dressing : Set up;Sitting   Lower Body Dressing: Minimal assistance   Toilet Transfer: Min guard;Ambulation;Regular Glass blower/designer Details (indicate cue type and reason): wife reports assisting pt to bathroom fairly easily earlier. no AD Toileting- Clothing Manipulation and Hygiene: Minimal assistance;Sit to/from stand;Sitting/lateral lean       Functional mobility during ADLs: Min guard General ADL Comments: fairly close to reported baseline with continued DOE though VSS on RA. Discussed energy conservation w/ pt/wife, seated rest breaks and assist as needed. Encouraged shower chair use initially     Vision Ability to See in Adequate Light: 0 Adequate Patient  Visual Report: No change from baseline Vision Assessment?: No apparent visual deficits     Perception     Praxis      Pertinent Vitals/Pain Pain Assessment Pain Assessment: No/denies pain     Hand Dominance Right    Extremity/Trunk Assessment Upper Extremity Assessment Upper Extremity Assessment: Overall WFL for tasks assessed   Lower Extremity Assessment Lower Extremity Assessment: Defer to PT evaluation   Cervical / Trunk Assessment Cervical / Trunk Assessment: Normal   Communication Communication Communication: No difficulties   Cognition Arousal/Alertness: Awake/alert Behavior During Therapy: WFL for tasks assessed/performed Overall Cognitive Status: Within Functional Limits for tasks assessed                                       General Comments  Wife at bedside, supportive    Exercises     Shoulder Instructions      Home Living Family/patient expects to be discharged to:: Private residence Living Arrangements: Spouse/significant other Available Help at Discharge: Family Type of Home: House Home Access: Stairs to enter Technical  of Steps: 3 or 5   Home Layout: One level     Bathroom Shower/Tub: Occupational psychologist: Standard     Home Equipment: Cane - single point;Rollator (4 wheels);Shower seat          Prior Functioning/Environment Prior Level of Function : Independent/Modified Independent;Driving             Mobility Comments: no AD typically. recently more SOB with activity ADLs Comments: IND with ADLs, has shower chair that he can use.        OT Problem List: Cardiopulmonary status limiting activity;Decreased activity tolerance      OT Treatment/Interventions: Self-care/ADL training;Therapeutic exercise;DME and/or AE instruction;Energy conservation;Therapeutic activities;Patient/family education    OT Goals(Current goals can be found in the care plan section) Acute Rehab OT Goals Patient Stated Goal: improve SOB, home when ready OT Goal Formulation: With patient/family Time For Goal Achievement: 07/25/22 Potential to Achieve Goals: Good  OT Frequency: Min 2X/week    Co-evaluation               AM-PAC OT "6 Clicks" Daily Activity     Outcome Measure Help from another person eating meals?: None Help from another person taking care of personal grooming?: A Little Help from another person toileting, which includes using toliet, bedpan, or urinal?: A Little Help from another person bathing (including washing, rinsing, drying)?: A Little Help from another person to put on and taking off regular upper body clothing?: A Little Help from another person to put on and taking off regular lower body clothing?: A Little 6 Click Score: 19   End of Session    Activity Tolerance: Patient tolerated treatment well Patient left: in bed;with call bell/phone within reach;with family/visitor present  OT Visit Diagnosis: Other abnormalities of gait and mobility (R26.89)                Time: [id number] OT Time Calculation (min): 19 min Charges:  OT General Charges $OT Visit: 1 Visit OT Evaluation $OT Eval Low Complexity: 1 Low  Malachy Chamber, OTR/L Acute Rehab Services Office: [phone]   Layla Maw 07/11/2022, 12:49 PM

## 2022-07-11 NOTE — Progress Notes (Signed)
Subjective: No complaints.  Objective: Vital signs in last 24 hours: Temp:  [97.6 F (36.4 C)-98 F (36.7 C)] 98 F (36.7 C) (02/20 0840) Pulse Rate:  [56-85] 82 (02/20 0840) Resp:  [9-20] 20 (02/20 0840) BP: (93-143)/(63-89) 143/83 (02/20 0840) SpO2:  [94 %-100 %] 96 % (02/20 0840) Weight:  [124.4 kg] 124.4 kg (02/20 0500) Last BM Date : 07/10/22  Intake/Output from previous day: 02/19 0701 - 02/20 0700 In: 200 [I.V.:200] Out: 300 [Urine:300] Intake/Output this shift: Total I/O In: 360 [P.O.:360] Out: 600 [Urine:600]  General appearance: Falls asleep very quickly GI: Obese, soft  Lab Results: Recent Labs    07/09/22 0503 07/10/22 0428 07/11/22 0259  WBC 13.7* 10.3 10.6*  HGB 8.1* 7.9* 8.1*  HCT 25.5* 24.7* 25.1*  PLT 171 192 200   BMET Recent Labs    07/09/22 0503 07/10/22 0428 07/11/22 0259  NA 132* 133* 134*  K 3.7 4.3 4.4  CL 98 100 101  CO2 22 24 23  $ GLUCOSE 116* 121* 109*  BUN 26* 21 17  CREATININE 1.06 1.03 1.03  CALCIUM 8.6* 8.5* 8.7*   LFT Recent Labs    07/11/22 0259  ALBUMIN 2.9*   PT/INR No results for input(s): "LABPROT", "INR" in the last 72 hours. Hepatitis Panel No results for input(s): "HEPBSAG", "HCVAB", "HEPAIGM", "HEPBIGM" in the last 72 hours. C-Diff No results for input(s): "CDIFFTOX" in the last 72 hours. Fecal Lactopherrin No results for input(s): "FECLLACTOFRN" in the last 72 hours.  Studies/Results: No results found.  Medications: Scheduled:  (feeding supplement) PROSource Plus  30 mL Oral BID BM   feeding supplement  1 Container Oral BID BM   latanoprost  1 drop Both Eyes QHS   metoprolol tartrate  12.5 mg Oral BID   mometasone-formoterol  2 puff Inhalation BID   multivitamin with minerals  1 tablet Oral Daily   pantoprazole (PROTONIX) IV  40 mg Intravenous Q12H   rosuvastatin  20 mg Oral Daily   sodium chloride flush  3 mL Intravenous Q12H   Continuous:  ferric gluconate (FERRLECIT) IVPB 250 mg (07/11/22  1136)   lactated ringers 10 mL/hr at 07/10/22 1126    Assessment/Plan: 1) Cecal and ascending colon superficial ulcerations/erosions. 2) Anemia. 3) Afib.   The biopsies are pending, but it is suspected that he has an ischemic source.  Typically, this is noted in the left side of the colon, however, it is anticipated that he will heal from this issue.  There was evidence that it was healing during the colonoscopy.  It may be that he can be restarted back on anticoagulation tomorrow pending the biopsy results.  Plan: 1) Continue with supportive care.  LOS: 5 days   Teresea Donley D 07/11/2022, 11:49 AM

## 2022-07-11 NOTE — Anesthesia Postprocedure Evaluation (Signed)
Anesthesia Post Note  Patient: Derrick Alexander  Procedure(s) Performed: COLONOSCOPY WITH PROPOFOL BIOPSY POLYPECTOMY     Patient location during evaluation: PACU Anesthesia Type: MAC Level of consciousness: awake and alert Pain management: pain level controlled Vital Signs Assessment: post-procedure vital signs reviewed and stable Respiratory status: spontaneous breathing, nonlabored ventilation and respiratory function stable Cardiovascular status: blood pressure returned to baseline and stable Postop Assessment: no apparent nausea or vomiting Anesthetic complications: no   No notable events documented.  Last Vitals:  Vitals:   07/10/22 2026 07/10/22 2100  BP:  93/63  Pulse:  73  Resp:  20  Temp:  36.6 C  SpO2: 96% 98%    Last Pain:  Vitals:   07/10/22 2100  TempSrc: Oral  PainSc: 0-No pain   Pain Goal:                   Lynda Rainwater

## 2022-07-11 NOTE — Progress Notes (Signed)
Rounding Note    Patient Name: Derrick Alexander Date of Encounter: 07/11/2022  [place] Cardiologist: Pixie Casino, MD   Subjective   Overall comfortable.  Winded from going to the bathroom.  Discussed with wife.  He has been short of breath for several months.  Inpatient Medications    Scheduled Meds:  (feeding supplement) PROSource Plus  30 mL Oral BID BM   feeding supplement  1 Container Oral BID BM   latanoprost  1 drop Both Eyes QHS   metoprolol tartrate  12.5 mg Oral BID   mometasone-formoterol  2 puff Inhalation BID   multivitamin with minerals  1 tablet Oral Daily   pantoprazole (PROTONIX) IV  40 mg Intravenous Q12H   rosuvastatin  20 mg Oral Daily   sodium chloride flush  3 mL Intravenous Q12H   Continuous Infusions:  ferric gluconate (FERRLECIT) IVPB 250 mg (07/10/22 1541)   lactated ringers 10 mL/hr at 07/10/22 1126   PRN Meds: acetaminophen **OR** acetaminophen, albuterol, hydrALAZINE, polyvinyl alcohol   Vital Signs    Vitals:   07/10/22 2100 07/11/22 0500 07/11/22 0808 07/11/22 0840  BP: 93/63   (!) 143/83  Pulse: 73   82  Resp: 20   20  Temp: 97.8 F (36.6 C)   98 F (36.7 C)  TempSrc: Oral   Oral  SpO2: 98%  95% 96%  Weight:  124.4 kg    Height:        Intake/Output Summary (Last 24 hours) at 07/11/2022 1018 Last data filed at 07/11/2022 [id number] Gross per 24 hour  Intake 200 ml  Output 900 ml  Net -700 ml      07/11/2022    5:00 AM 07/08/2022    4:12 AM 07/06/2022    8:11 AM  Last 3 Weights  Weight (lbs) 274 lb 4 oz 269 lb 10 oz 248 lb 0.3 oz  Weight (kg) 124.4 kg 122.3 kg 112.5 kg      Telemetry    Sinus rhythm frequent PVCs- Personally Reviewed  ECG    No new.  Original EKG does show subtle ST segment depression diffusely with A-fib.- Personally Reviewed  Physical Exam   GEN: No acute distress.   Neck: No JVD Cardiac: Regular rhythm with frequent PVCs, ectopy Respiratory: Increased respiratory effort which  decreased after sitting for a prolonged period of time talking.. Neuro:  Nonfocal  Psych: Normal affect   Labs    High Sensitivity Troponin:   Recent Labs  Lab 07/06/22 0904 07/06/22 1849 07/07/22 0932 07/07/22 1128  TROPONINIHS 796* 3,612* 3,438* 4,113*     Chemistry Recent Labs  Lab 07/06/22 0904 07/07/22 0932 07/08/22 0439 07/09/22 0503 07/10/22 0428 07/11/22 0259  NA 135   < > 134* 132* 133* 134*  K 4.8   < > 4.0 3.7 4.3 4.4  CL 104   < > 101 98 100 101  CO2 19*   < > 22 22 24 23  $ GLUCOSE 139*   < > 123* 116* 121* 109*  BUN 36*   < > 27* 26* 21 17  CREATININE 1.19   < > 1.04 1.06 1.03 1.03  CALCIUM 8.5*   < > 8.7* 8.6* 8.5* 8.7*  MG  --   --  2.2 2.5*  --  2.4  PROT 5.0*  --   --   --   --   --   ALBUMIN 3.1*  --  3.0* 3.0*  --  2.9*  AST 38  --   --   --   --   --  ALT 12  --   --   --   --   --   ALKPHOS 39  --   --   --   --   --   BILITOT 0.6  --   --   --   --   --   GFRNONAA >60   < > >60 >60 >60 >60  ANIONGAP 12   < > 11 12 9 10   $ < > = values in this interval not displayed.    Lipids  Recent Labs  Lab 07/08/22 0439  CHOL 77  TRIG 119  HDL 28*  LDLCALC 25  CHOLHDL 2.8    Hematology Recent Labs  Lab 07/09/22 0503 07/10/22 0428 07/11/22 0259  WBC 13.7* 10.3 10.6*  RBC 2.79* 2.71*  2.76* 2.77*  HGB 8.1* 7.9* 8.1*  HCT 25.5* 24.7* 25.1*  MCV 91.4 91.1 90.6  MCH 29.0 29.2 29.2  MCHC 31.8 32.0 32.3  RDW 16.0* 15.6* 15.5  PLT 171 192 200   Thyroid No results for input(s): "TSH", "FREET4" in the last 168 hours.  BNP Recent Labs  Lab 07/06/22 0904 07/07/22 0932  BNP 266.1* 522.0*    DDimer No results for input(s): "DDIMER" in the last 168 hours.   Radiology    No results found.  Cardiac Studies   Echocardiogram as above, normal ejection fraction, previously moderate aortic stenosis.  Patient Profile     77 y.o. male with GI bleed, 3 unit transfusion, elevated troponin 4100, prior gastric AVMs, normal ejection fraction,  moderate aortic stenosis, Xarelto for chronic anticoagulation.  Assessment & Plan    Acute GI bleed -Colonoscopy Dr. Benson [place] 07/10/22 -multiple cecal ulcerations.  Continuing to hold anticoagulation at the request of GI.  3 unit transfusion previously.  States that the iron infusion made him feel warm all over.    Type 2 MI -Troponin elevation 4000.  He does not have any previously diagnosed coronary artery disease however his hemoglobin was 5.9 on admission.  This is likely a supply demand oxygen mismatch.  Could have underlying CAD however.  In looking back at his previous EKG, original one did show some evidence of diffuse ST segment depression which could be indicative of global ischemia.  Even though this may not represent plaque rupture, this does portend a worsened prognosis. - It would be helpful once stable to define coronary anatomy with cardiac catheterization.  If he remains chest pain-free, could consider this even as outpatient, especially given his recent need for blood transfusion and prominent cecal ulcerations noted.  Discussed with he and wife.  Atrial fibrillation paroxysmal - Holding on anticoagulation at this point.  Was previously on Xarelto.  Appreciate GIs recommendations -Consider Watchman after discharge. -Currently well rate controlled.  Metoprolol 12.5 twice daily.  Aortic stenosis - Previously considered moderate in severity.  This is one of the reasons why think cardiac catheterization would be helpful.  If for instance he had underlying triple-vessel disease and required revascularization via bypass, aortic valve replacement would take place at that time.  His aortic stenosis is not severe however.    Acute on chronic diastolic heart failure - Large degree of this is deconditioning with combination of morbid obesity BMI 44 in addition to recent anemia.  Does not appear to be excessively fluid overloaded at this time.      For questions or updates, please contact  [place] Please consult [email or web address] for contact info under  Signed, Candee Furbish, MD  07/11/2022, 10:18 AM

## 2022-07-12 ENCOUNTER — Encounter (HOSPITAL_COMMUNITY): Payer: Self-pay | Admitting: Gastroenterology

## 2022-07-12 ENCOUNTER — Other Ambulatory Visit (HOSPITAL_COMMUNITY): Payer: Self-pay

## 2022-07-12 DIAGNOSIS — I5033 Acute on chronic diastolic (congestive) heart failure: Secondary | ICD-10-CM

## 2022-07-12 LAB — RENAL FUNCTION PANEL
Albumin: 2.9 g/dL — ABNORMAL LOW (ref 3.5–5.0)
Anion gap: 9 (ref 5–15)
BUN: 14 mg/dL (ref 8–23)
CO2: 23 mmol/L (ref 22–32)
Calcium: 8.7 mg/dL — ABNORMAL LOW (ref 8.9–10.3)
Chloride: 100 mmol/L (ref 98–111)
Creatinine, Ser: 0.85 mg/dL (ref 0.61–1.24)
GFR, Estimated: >60 mL/min (ref >60)
Glucose, Bld: 108 mg/dL — ABNORMAL HIGH (ref 70–99)
Phosphorus: 3.7 mg/dL (ref 2.5–4.6)
Potassium: 3.9 mmol/L (ref 3.5–5.1)
Sodium: 132 mmol/L — ABNORMAL LOW (ref 135–145)

## 2022-07-12 LAB — MAGNESIUM: Magnesium: 2.3 mg/dL (ref 1.7–2.4)

## 2022-07-12 LAB — CBC
HCT: 25 % — ABNORMAL LOW (ref 39.0–52.0)
Hemoglobin: 7.9 g/dL — ABNORMAL LOW (ref 13.0–17.0)
MCH: 28.4 pg (ref 26.0–34.0)
MCHC: 31.6 g/dL (ref 30.0–36.0)
MCV: 89.9 fL (ref 80.0–100.0)
Platelets: 217 10*3/uL (ref 150–400)
RBC: 2.78 MIL/uL — ABNORMAL LOW (ref 4.22–5.81)
RDW: 15.8 % — ABNORMAL HIGH (ref 11.5–15.5)
WBC: 10.2 10*3/uL (ref 4.0–10.5)
nRBC: 0.4 % — ABNORMAL HIGH (ref 0.0–0.2)

## 2022-07-12 MED ORDER — PANTOPRAZOLE SODIUM 40 MG PO TBEC
40.0000 mg | DELAYED_RELEASE_TABLET | Freq: Two times a day (BID) | ORAL | 0 refills | Status: DC
  Filled 2022-07-12: qty 60, 30d supply, fill #0

## 2022-07-12 MED ORDER — IRON 325 (65 FE) MG PO TABS: 325.0000 mg | ORAL_TABLET | Freq: Every day | ORAL | 0 refills | Status: DC

## 2022-07-12 MED ORDER — METOPROLOL TARTRATE 25 MG PO TABS
12.5000 mg | ORAL_TABLET | Freq: Two times a day (BID) | ORAL | 0 refills | Status: DC
  Filled 2022-07-12: qty 30, 30d supply, fill #0

## 2022-07-12 MED ORDER — ACETAMINOPHEN 325 MG PO TABS: 650.0000 mg | ORAL_TABLET | Freq: Four times a day (QID) | ORAL | Status: AC | PRN

## 2022-07-12 MED ORDER — APIXABAN 5 MG PO TABS
5.0000 mg | ORAL_TABLET | Freq: Two times a day (BID) | ORAL | 0 refills | Status: DC
  Filled 2022-07-12: qty 60, 30d supply, fill #0

## 2022-07-12 MED ORDER — APIXABAN 5 MG PO TABS
5.0000 mg | ORAL_TABLET | Freq: Two times a day (BID) | ORAL | Status: DC
  Administered 2022-07-12: 5 mg via ORAL
  Filled 2022-07-12: qty 1

## 2022-07-12 NOTE — Progress Notes (Addendum)
Physical Therapy Treatment Patient Details Name: Derrick Alexander MRN: [id number] DOB: [birth year] Today's Date: 07/12/2022   History of Present Illness Pt is a 77 y/o male admitted with GI bleed and anemia. Pt required blood transfusions and underwent EGD on 2/16 showing superficial ulcerations/erosions of cecal and ascending colon. Had colonoscopy 2/19 showed multiple ulcers in ascending colon and cecum (biopsy), one 3 mm polyp in descending colon (resected and retrieved) and diverticulosis in sigmoid colon.   PMH: a fib, COPD, HTN, tobacco use, syncope.    PT Comments    Pt seen for PT tx with pt received in handoff from OT. Pt's wife present for session. Pt is able to complete STS independently & ambulate to nurses station & back without AD with slow gait speed & prolonged standing rest break half way through. Pt without LOB during session, endorses moderate DOE. Attempted to obtain SpO2 with good pleth waveform via pt's finger and ear but inconsistent wave. SpO2 reading as low as 78% but as high as >90% but unsure of accuracy. SpO2 90% on room air upon PT departure. Max HR 144 bpm at end of session that quickly resolved.    Recommendations for follow up therapy are one component of a multi-disciplinary discharge planning process, led by the attending physician.  Recommendations may be updated based on patient status, additional functional criteria and insurance authorization.  Follow Up Recommendations  Home health PT     Assistance Recommended at Discharge Intermittent Supervision/Assistance  Patient can return home with the following A little help with walking and/or transfers;A little help with bathing/dressing/bathroom;Help with stairs or ramp for entrance;Assistance with cooking/housework   Equipment Recommendations  None recommended by PT (pt reports he has a rollator at home)    Recommendations for Other Services       Precautions / Restrictions Precautions Precautions:  Fall Precaution Comments: monitor DOE Restrictions Weight Bearing Restrictions: No     Mobility  Bed Mobility               General bed mobility comments: not tested, pt received & left sitting EOB    Transfers Overall transfer level: Independent   Transfers: Sit to/from Stand             General transfer comment: STS from EOB without assistance/LOB    Ambulation/Gait Ambulation/Gait assistance: Independent Gait Distance (Feet): 100 Feet Assistive device: None Gait Pattern/deviations: Decreased step length - right, Decreased step length - left, Decreased stride length Gait velocity: decreased     General Gait Details: Pt ambulates room<>nurses station & back with prolonged standing rest break halfway through.   Stairs             Wheelchair Mobility    Modified Rankin (Stroke Patients Only)       Balance Overall balance assessment: Mild deficits observed, not formally tested Sitting-balance support: Feet supported Sitting balance-Leahy Scale: Normal     Standing balance support: During functional activity, No upper extremity supported Standing balance-Leahy Scale: Good                              Cognition Arousal/Alertness: Awake/alert Behavior During Therapy: WFL for tasks assessed/performed Overall Cognitive Status: Within Functional Limits for tasks assessed  Exercises      General Comments General comments (skin integrity, edema, etc.): Wife present, supportive      Pertinent Vitals/Pain Pain Assessment Pain Assessment: No/denies pain    Home Living                          Prior Function            PT Goals (current goals can now be found in the care plan section) Acute Rehab PT Goals Patient Stated Goal: to go home soon PT Goal Formulation: With patient/family Time For Goal Achievement: 07/18/22 Potential to Achieve Goals:  Good Progress towards PT goals: Progressing toward goals    Frequency    Min 3X/week      PT Plan Current plan remains appropriate    Co-evaluation              AM-PAC PT "6 Clicks" Mobility   Outcome Measure  Help needed turning from your back to your side while in a flat bed without using bedrails?: None Help needed moving from lying on your back to sitting on the side of a flat bed without using bedrails?: None Help needed moving to and from a bed to a chair (including a wheelchair)?: None Help needed standing up from a chair using your arms (e.g., wheelchair or bedside chair)?: None Help needed to walk in hospital room?: None Help needed climbing 3-5 steps with a railing? : A Little 6 Click Score: 23    End of Session   Activity Tolerance:  (limited by SOB) Patient left: with family/visitor present;with call bell/phone within reach (sitting EOB) Nurse Communication:  (O2 during session) PT Visit Diagnosis: Difficulty in walking, not elsewhere classified (R26.2)     Time: [id number] PT Time Calculation (min) (ACUTE ONLY): 15 min  Charges:  $Therapeutic Activity: 8-22 mins                     Lavone Nian, PT, DPT 07/12/22, 1:05 PM   Waunita Schooner 07/12/2022, 1:04 PM

## 2022-07-12 NOTE — TOC Transition Note (Signed)
Transition of Care [facility]) - CM/SW Discharge Note   Patient Details  Name: Derrick Alexander MRN: [id number] Date of Birth: 1946/04/17  Transition of Care [facility]) CM/SW Contact:  Levonne Lapping, RN Phone Number: 07/12/2022, 1:40 PM   Clinical Narrative:     CM met with Patient bedside to discuss DC plan.  Patient will dc to home in [place], with Wife. Home Health PT has been recommended and will be provided by [facility]- choice. Patient has DME at home and will not need anything before DC. PCP is established. Patient will follow up as directed in AVS.     Final next level of care: Home w Home Health Services Barriers to Discharge: Continued Medical Work up   Patient Goals and CMS Choice CMS Medicare.gov Compare Post Acute Care list provided to:: Patient Choice offered to / list presented to : Patient  Discharge Placement                         Discharge Plan and Services Additional resources added to the After Visit Summary for   In-house Referral: NA Discharge Planning Services: CM Consult Post Acute Care Choice: Home Health          DME Arranged: N/A         HH Arranged: PT HH Agency: [place] (TBD) Date HH Agency Contacted: 07/12/22 Time [place]: [id number] Representative spoke with at [place]: [place] Interventions [place]: No Food Insecurity (04/28/2022)  Housing: Low Risk  (04/28/2022)  Transportation Needs: No Transportation Needs (04/28/2022)  Utilities: Not At Risk (04/28/2022)  Tobacco Use: Medium Risk (07/10/2022)     Readmission Risk Interventions     No data to display

## 2022-07-12 NOTE — Progress Notes (Signed)
Occupational Therapy Treatment/Discharge Patient Details Name: Derrick Alexander MRN: [id number] DOB: [birth year] Today's Date: 07/12/2022   History of present illness Pt is a 77 y/o male admitted with GI bleed and anemia. Pt required blood transfusions and underwent EGD on 2/16 showing superficial ulcerations/erosions of cecal and ascending colon. Had colonoscopy 2/19 showed multiple ulcers in ascending colon and cecum (biopsy), one 3 mm polyp in descending colon (resected and retrieved) and diverticulosis in sigmoid colon.   PMH: a fib, COPD, HTN, tobacco use, syncope.   OT comments  Session focused on energy conservation strategies education for ADLs/IADLs with handout provided. See ADL section for details. Per wife, pt has been independently ambulating to/from bathroom, denies any concerns regarding ADL/mobility mgmt at home. VSS on RA w/ baseline DOE d/t a fib. OT goals met and/or adequate for DC. No further skilled OT services needed at this time.   Recommendations for follow up therapy are one component of a multi-disciplinary discharge planning process, led by the attending physician.  Recommendations may be updated based on patient status, additional functional criteria and insurance authorization.    Follow Up Recommendations  No OT follow up     Assistance Recommended at Discharge PRN  Patient can return home with the following  A little help with bathing/dressing/bathroom;Assistance with cooking/housework;Help with stairs or ramp for entrance   Equipment Recommendations  None recommended by OT    Recommendations for Other Services      Precautions / Restrictions Precautions Precautions: Fall Precaution Comments: monitor DOE Restrictions Weight Bearing Restrictions: No       Mobility Bed Mobility               General bed mobility comments: EOB on entry    Transfers Overall transfer level: Independent Equipment used: None                     Balance  Overall balance assessment: Mild deficits observed, not formally tested                                         ADL either performed or assessed with clinical judgement   ADL Overall ADL's : Needs assistance/impaired                     Lower Body Dressing: Minimal assistance Lower Body Dressing Details (indicate cue type and reason): Min A for sock mgmt. pt unable to cross LE or prop LE up on bed, unable tobend to reach feet. Discussed AE use at home w/ pt familiar with sock aide and reacher.               General ADL Comments: Emphasis on energy conservation education (handout provided) with collaboration re: dressing, seated rest breaks, having chair available to rest when working on endurance with laps in the house, use of shopping carts vs shopping scooter, rollator in community, and safely picking up items from floor at home    Extremity/Trunk Assessment Upper Extremity Assessment Upper Extremity Assessment: Overall WFL for tasks assessed   Lower Extremity Assessment Lower Extremity Assessment: Defer to PT evaluation        Vision   Vision Assessment?: No apparent visual deficits   Perception     Praxis      Cognition Arousal/Alertness: Awake/alert Behavior During Therapy: WFL for tasks assessed/performed Overall Cognitive Status: Within Functional Limits for tasks  assessed                                          Exercises      Shoulder Instructions       General Comments Wife present, supportive    Pertinent Vitals/ Pain       Pain Assessment Pain Assessment: No/denies pain  Home Living                                          Prior Functioning/Environment              Frequency  Min 2X/week        Progress Toward Goals  OT Goals(current goals can now be found in the care plan section)  Progress towards OT goals: Progressing toward goals;Goals met/education completed,  patient discharged from OT  Acute Rehab OT Goals Patient Stated Goal: home today OT Goal Formulation: With patient/family Time For Goal Achievement: 07/25/22 Potential to Achieve Goals: Good ADL Goals Pt Will Perform Lower Body Dressing: with modified independence;sit to/from stand Additional ADL Goal #1: Pt to verbalize at least 3 energy conservation strategies to implement during daily routine Additional ADL Goal #2: Pt to increase standing activity tolerance > 10 min with no more than one rest break  Plan Discharge plan remains appropriate;All goals met and education completed, patient discharged from OT services    Co-evaluation                 AM-PAC OT "6 Clicks" Daily Activity     Outcome Measure   Help from another person eating meals?: None Help from another person taking care of personal grooming?: None Help from another person toileting, which includes using toliet, bedpan, or urinal?: None Help from another person bathing (including washing, rinsing, drying)?: A Little Help from another person to put on and taking off regular upper body clothing?: None Help from another person to put on and taking off regular lower body clothing?: A Little 6 Click Score: 22    End of Session    OT Visit Diagnosis: Other abnormalities of gait and mobility (R26.89)   Activity Tolerance Patient tolerated treatment well   Patient Left in bed;with call bell/phone within reach;with family/visitor present;Other (comment) (with PT)   Nurse Communication Mobility status        Time: 1206-1226 OT Time Calculation (min): 20 min  Charges: OT General Charges $OT Visit: 1 Visit OT Treatments $Self Care/Home Management : 8-22 mins  Malachy Chamber, OTR/L Acute Rehab Services Office: [phone]   Layla Maw 07/12/2022, 12:53 PM

## 2022-07-12 NOTE — Discharge Summary (Signed)
Physician Discharge Summary  Rakshan Bedinger [id number] DOB: 23-Nov-1945 DOA: 07/06/2022  PCP: [name], MD  Admit date: 07/06/2022 Discharge date: 07/12/2022  Admitted From: (Home) Disposition:  (Home)  Recommendations for Outpatient Follow-up:  Follow up with PCP in 1-2 weeks Please obtain BMP/CBC in one week   Home Health: (YES)  Discharge Condition: (Stable) CODE STATUS: (FULL) Diet recommendation: Heart Healthy  Brief/Interim Summary:  77 year old M with PMH of A-fib on Xarelto, GIB/gastric AVMs, OSA on CPAP, diastolic CHF, COPD, morbid obesity, HTN and HLD presenting with melena for about a week, and admitted for symptomatic anemia due to acute upper GI bleed, elevated troponin and some respiratory distress.  Patient was hospitalized 04/27/2022-04/30/2022 for acute upper GI bleed with gastric AVM that was treated with APC.  He reported vague chest pain.  Troponin 796 and trended up to 3612.  EKG with some ST depressions in lateral leads which appears to be new.  2 units of blood ordered.  GI consulted and patient was admitted.    The next day, troponin continued to rise although patient did not have chest pain.  Cardiology consulted.   EGD on 2/16 showed erythematous gastric mucosa.  Colonoscopy on 2/19 showed multiple ulcers in ascending colon and cecum (biopsy), one 3 mm polyp in descending colon (resected and retrieved) and diverticulosis in sigmoid colon.  GI recommended resuming regular diet, continuing current medication, anticoagulation has been held awaiting pathology results, cecal ulcer pathology significant for ischemic ulcer, patient has cleared by GI to resume anticoagulation, and he has started on Eliquis instead of Xarelto due to lower GI bleed risk .  Acute on chronic blood loss anemia due to acute GI bleeding: history of gastric AVMs that was treated with APC in 04/2022.  EGD and colonoscopy as above.  He is on Xarelto for atrial fibrillation.  Transfused 4 units  with some improvement in Hgb.  Anemia panel with iron deficiency EGD on 2/16 showed erythematous gastric mucosa.  Colonoscopy on 2/19 showed multiple ulcers in ascending colon and cecum (biopsy), one 3 mm polyp in descending colon (resected and retrieved) and diverticulosis in sigmoid colon.  GI recommended resuming regular diet, continuing current medication, anticoagulation has been held awaiting pathology results, cecal ulcer pathology significant for ischemic ulcer, patient has cleared by GI to resume anticoagulation, and he has started on Eliquis instead of Xarelto due to lower GI bleed risk. -Continue with Protonix 40 mg p.o. twice daily     Acute on chronic diastolic CHF/moderate aortic stenosis: TTE with LVEF of 60 to 65%, G2-DD and mod AS. BNP slightly up likely in the setting of anemia, IV fluid and blood transfusion.  Feels his breathing is labored.  Grossly euvolemic but difficult exam due to body habitus. -Cardiology following-intermittently diuresing. -Strict intake and output, daily weights and renal functions.   Elevated troponin/abnormal EKG: Troponin 800>> 3600>> 4100>>.  EKG with some ST depression in lateral leads. Reported vague chest pain on presentation.  Currently chest pain-free but dyspnea.  TTE as above.  Per cardiology, demand ischemia further workup as an outpatient.    Paroxysmal atrial fibrillation and SVT: Currently in sinus rhythm with PVCs.  Does not seem to be on rate or rhythm control medications at home.  CHA2DS2-VASc score > 4. -Started on metoprolol 12.5 mg twice daily -Question instead of Xarelto on discharge   Chronic COPD -Continue on inhalers -Continue Xopenex as needed   Essential hypertension: -Irbesartan and Aldactone has been discontinued, he will be discharged on metoprolol  OSA reportedly compliant with CPAP at home:   Hyponatremia: Mild. -Continue monitoring   Leukocytosis: Likely demargination.  Resolved. -Continue monitoring    Hyperlipidemia -Continue rosuvastatin   Glaucoma -Continue eyedrops.   Morbid obesity Body mass index is 44.27 kg/m.   Discharge Diagnoses:  Principal Problem:   Acute GI bleeding Active Problems:   Chronic obstructive pulmonary disease (HCC)   Paroxysmal atrial fibrillation with RVR (HCC)   Morbid obesity with BMI of 40.0-44.9, adult (HCC)   Essential hypertension   Paroxysmal atrial fibrillation (HCC)   OSA on CPAP   Anemia due to gastrointestinal blood loss   Glaucoma (increased eye pressure)   Symptomatic anemia   Acute on chronic diastolic CHF (congestive heart failure) (HCC)   Elevated troponin   Abnormal EKG   Chronic obstructive pulmonary disease (COPD) ([place])   Anticoagulated    Discharge Instructions  Discharge Instructions     Diet - low sodium heart healthy   Complete by: As directed    Discharge instructions   Complete by: As directed    Follow with Primary MD [name], MD in 7 days   Get CBC, CMP,  checked  by Primary MD next visit.    Activity: As tolerated with Full fall precautions use walker/cane & assistance as needed   Disposition Home    Diet: Heart Healthy  On your next visit with your primary care physician please Get Medicines reviewed and adjusted.   Please request your Prim.MD to go over all Hospital Tests and Procedure/Radiological results at the follow up, please get all Hospital records sent to your Prim MD by signing hospital release before you go home.   If you experience worsening of your admission symptoms, develop shortness of breath, life threatening emergency, suicidal or homicidal thoughts you must seek medical attention immediately by calling 911 or calling your MD immediately  if symptoms less severe.  You Must read complete instructions/literature along with all the possible adverse reactions/side effects for all the Medicines you take and that have been prescribed to you. Take any new Medicines after you have  completely understood and accpet all the possible adverse reactions/side effects.   Do not drive, operating heavy machinery, perform activities at heights, swimming or participation in water activities or provide baby sitting services if your were admitted for syncope or siezures until you have seen by Primary MD or a Neurologist and advised to do so again.  Do not drive when taking Pain medications.    Do not take more than prescribed Pain, Sleep and Anxiety Medications  Special Instructions: If you have smoked or chewed Tobacco  in the last 2 yrs please stop smoking, stop any regular Alcohol  and or any Recreational drug use.  Wear Seat belts while driving.   Please note  You were cared for by a hospitalist during your hospital stay. If you have any questions about your discharge medications or the care you received while you were in the hospital after you are discharged, you can call the unit and asked to speak with the hospitalist on call if the hospitalist that took care of you is not available. Once you are discharged, your primary care physician will handle any further medical issues. Please note that NO REFILLS for any discharge medications will be authorized once you are discharged, as it is imperative that you return to your primary care physician (or establish a relationship with a primary care physician if you do not have one) for your aftercare  needs so that they can reassess your need for medications and monitor your lab values.   Increase activity slowly   Complete by: As directed       Allergies as of 07/12/2022       Reactions   Cardizem [diltiazem] Shortness Of Breath   Pacerone [amiodarone] Shortness Of Breath, Nausea Only, Other (See Comments)   Abdominal pain Dizziness   Tambocor [flecainide] Shortness Of Breath        Medication List     STOP taking these medications    acetaminophen 650 MG CR tablet Commonly known as: TYLENOL Replaced by: acetaminophen  325 MG tablet   irbesartan 300 MG tablet Commonly known as: AVAPRO   spironolactone 25 MG tablet Commonly known as: ALDACTONE   Xarelto 20 MG Tabs tablet Generic drug: rivaroxaban       TAKE these medications    acetaminophen 325 MG tablet Commonly known as: TYLENOL Take 2 tablets (650 mg total) by mouth every 6 (six) hours as needed for mild pain (or Fever >/= 101). Replaces: acetaminophen 650 MG CR tablet   albuterol 108 (90 Base) MCG/ACT inhaler Commonly known as: VENTOLIN HFA Inhale 2 puffs into the lungs every 6 (six) hours as needed for wheezing or shortness of breath.   albuterol (2.5 MG/3ML) 0.083% nebulizer solution Commonly known as: PROVENTIL Take 3 mLs (2.5 mg total) by nebulization every 4 (four) hours as needed for wheezing or shortness of breath.   apixaban 5 MG Tabs tablet Commonly known as: ELIQUIS Take 1 tablet (5 mg total) by mouth 2 (two) times daily.   cetirizine 10 MG tablet Commonly known as: ZYRTEC Take 10 mg by mouth daily.   famotidine 20 MG tablet Commonly known as: PEPCID Take 20 mg by mouth at bedtime.   fluticasone-salmeterol 250-50 MCG/ACT Aepb Commonly known as: Advair Diskus Inhale 1 puff into the lungs in the morning and at bedtime.   furosemide 40 MG tablet Commonly known as: LASIX Take 1.5 tablets (60 mg total) by mouth 2 (two) times daily.   Iron 325 (65 Fe) MG Tabs Take 1 tablet (325 mg total) by mouth daily. Start taking on: July 26, 2022 What changed: These instructions start on July 26, 2022. If you are unsure what to do until then, ask your doctor or other care provider.   latanoprost 0.005 % ophthalmic solution Commonly known as: XALATAN Place 1 drop into both eyes at bedtime.   metoprolol tartrate 25 MG tablet Commonly known as: LOPRESSOR Take 0.5 tablets (12.5 mg total) by mouth 2 (two) times daily.   pantoprazole 40 MG tablet Commonly known as: Protonix Take 1 tablet (40 mg total) by mouth 2 (two) times  daily before a meal. What changed: when to take this   rosuvastatin 20 MG tablet Commonly known as: CRESTOR Take 20 mg by mouth daily.   SYSTANE OP Place 1 drop into both eyes daily as needed (dry eyes).        Follow-up Information     Marilynn Rail Jossie Ng, NP Follow up.   Specialty: Cardiology Why: Hospital follow-up with Cardiology scheduled for 08/09/2022 at 10:55am. Please arrive 15 minutes early for check-in. If this date/time does not work for you, please call our office to reschedule. Contact information: [address] [place] [address] [place], [facility] Follow up.   Specialty: [place] Why: Alvis Lemmings will contact you to schedule home physical therapy Contact  information: [address] [place] [address] [phone]                Allergies  Allergen Reactions   Cardizem [Diltiazem] Shortness Of Breath   Pacerone [Amiodarone] Shortness Of Breath, Nausea Only and Other (See Comments)    Abdominal pain Dizziness   Tambocor [Flecainide] Shortness Of Breath    Consultations: Gastroenterology Cardiology   Procedures/Studies: ECHOCARDIOGRAM COMPLETE  Result Date: 07/08/2022    ECHOCARDIOGRAM REPORT   Patient Name:   ANCHOR HUISINGA Date of Exam: 07/08/2022 Medical Rec #:  [id number]    Height:       66.0 in Accession #:    [id number]   Weight:       269.6 lb Date of Birth:  February 19, 1946    BSA:          2.270 m Patient Age:    68 years     BP:           124/62 mmHg Patient Gender: M            HR:           89 bpm. Exam Location:  Inpatient Procedure: 2D Echo, Cardiac Doppler, Color Doppler and Intracardiac            Opacification Agent Indications:    Atrial Fibrillation I48.91                 NSTEMI I21.4  History:        Patient has prior history of Echocardiogram examinations, most                 recent 03/27/2022. CHF, Arrythmias:LBBB, Tachycardia, Atrial                 Fibrillation and PVC,  Signs/Symptoms:Syncope, Dyspnea and Chest                 Pain; Risk Factors:Hypertension, Sleep Apnea and Current Smoker.  Sonographer:    Ronny Flurry Referring Phys: [address] [place]  1. Left ventricular ejection fraction, by estimation, is 60 to 65%. The left ventricle has normal function. The left ventricle has no regional wall motion abnormalities. There is moderate left ventricular hypertrophy. Left ventricular diastolic parameters are consistent with Grade II diastolic dysfunction (pseudonormalization). Elevated left ventricular end-diastolic pressure.  2. Right ventricular systolic function is normal. The right ventricular size is normal.  3. No color flow done on atrial septum in sub costal views.  4. The mitral valve is abnormal. No evidence of mitral valve regurgitation. No evidence of mitral stenosis.  5. CW doppler of AV not adequate prior echo 03/27/22 with moderate AS mean gradient 26.4 mmhg peak 52.5 mmhg AVA 1.1 cm2 and DVI 0.34 would have tech return for better attempt at gradients Likely still moderate AS CHAT message sent to tech in Epic. The aortic valve is tricuspid. Aortic valve regurgitation is not visualized. See comments.  6. The inferior vena cava is dilated in size with >50% respiratory variability, suggesting right atrial pressure of 8 mmHg. FINDINGS  Left Ventricle: Left ventricular ejection fraction, by estimation, is 60 to 65%. The left ventricle has normal function. The left ventricle has no regional wall motion abnormalities. Definity contrast agent was given IV to delineate the left ventricular  endocardial borders. The left ventricular internal cavity size was normal in size. There is moderate left ventricular hypertrophy. Left ventricular diastolic parameters are consistent with Grade II diastolic dysfunction (pseudonormalization). Elevated left  ventricular end-diastolic pressure. Right Ventricle: The right ventricular size is normal. No increase in right  ventricular wall thickness. Right ventricular systolic function is normal. Left Atrium: Left atrial size was not well visualized. Right Atrium: Right atrial size was normal in size. Pericardium: There is no evidence of pericardial effusion. Mitral Valve: The mitral valve is abnormal. There is mild thickening of the mitral valve leaflet(s). There is mild calcification of the mitral valve leaflet(s). No evidence of mitral valve regurgitation. No evidence of mitral valve stenosis. Tricuspid Valve: The tricuspid valve is normal in structure. Tricuspid valve regurgitation is not demonstrated. No evidence of tricuspid stenosis. Aortic Valve: CW doppler of AV not adequate prior echo 03/27/22 with moderate AS mean gradient 26.4 mmhg peak 52.5 mmhg AVA 1.1 cm2 and DVI 0.34 would have tech return for better attempt at gradients Likely still moderate AS CHAT message sent to tech in Epic. The aortic valve is tricuspid. Aortic valve regurgitation is not visualized. See comments. Pulmonic Valve: The pulmonic valve was normal in structure. Pulmonic valve regurgitation is not visualized. No evidence of pulmonic stenosis. Aorta: The aortic root is normal in size and structure. Venous: The inferior vena cava is dilated in size with greater than 50% respiratory variability, suggesting right atrial pressure of 8 mmHg. IAS/Shunts: No atrial level shunt detected by color flow Doppler.  LEFT VENTRICLE PLAX 2D LVIDd:         6.20 cm   Diastology LVIDs:         4.40 cm   LV e' medial:    7.89 cm/s LV PW:         1.30 cm   LV E/e' medial:  20.2 LV IVS:        1.60 cm   LV e' lateral:   9.90 cm/s LVOT diam:     2.30 cm   LV E/e' lateral: 16.1 LV SV:         80 LV SV Index:   35 LVOT Area:     4.15 cm  RIGHT VENTRICLE RV S prime:     15.70 cm/s TAPSE (M-mode): 2.7 cm LEFT ATRIUM         Index LA diam:    3.80 cm 1.67 cm/m  AORTIC VALVE LVOT Vmax:   130.67 cm/s LVOT Vmean:  83.800 cm/s LVOT VTI:    0.193 m  AORTA Ao Root diam: 3.10 cm Ao Asc  diam:  3.60 cm MITRAL VALVE MV Area (PHT): 4.80 cm     SHUNTS MV Decel Time: 158 msec     Systemic VTI:  0.19 m MV E velocity: 159.00 cm/s  Systemic Diam: 2.30 cm MV A velocity: 89.40 cm/s MV E/A ratio:  1.78 Jenkins Rouge MD Electronically signed by Jenkins Rouge MD Signature Date/Time: 07/08/2022/11:58:52 AM    Final    DG Chest Port 1 View  Result Date: 07/06/2022 CLINICAL DATA:  Shortness of breath EXAM: PORTABLE CHEST 1 VIEW COMPARISON:  04/29/2022 FINDINGS: Lungs are clear.  No pleural effusion or pneumothorax. The heart is normal in size. IMPRESSION: No evidence of acute cardiopulmonary disease. Electronically Signed   By: Julian Hy M.D.   On: 07/06/2022 08:39     Subjective:  No significant events overnight, he denies any complaints today, reports BM yesterday loose, light brown in color Discharge Exam: Vitals:   07/12/22 0000 07/12/22 0801  BP: (!) 110/59 (!) 104/45  Pulse: 64 70  Resp: 17 20  Temp:  98 F (36.7 C)  SpO2:  91% 94%   Vitals:   07/11/22 2002 07/12/22 0000 07/12/22 0500 07/12/22 0801  BP: (!) 131/58 (!) 110/59  (!) 104/45  Pulse: 82 64  70  Resp:  17  20  Temp:    98 F (36.7 C)  TempSrc:    Oral  SpO2:  91%  94%  Weight:   124.3 kg   Height:        General: Pt is alert, awake, not in acute distress Cardiovascular: RRR, S1/S2 +, no rubs, no gallops Respiratory: CTA bilaterally, no wheezing, no rhonchi Abdominal: Soft, NT, ND, bowel sounds + Extremities: no edema, no cyanosis    The results of significant diagnostics from this hospitalization (including imaging, microbiology, ancillary and laboratory) are listed below for reference.     Microbiology: Recent Results (from the past 240 hour(s))  Resp panel by RT-PCR (RSV, Flu A&B, Covid) Anterior Nasal Swab     Status: None   Collection Time: 07/06/22  8:08 AM   Specimen: Anterior Nasal Swab  Result Value Ref Range Status   SARS Coronavirus 2 by RT PCR NEGATIVE NEGATIVE Final   Influenza A  by PCR NEGATIVE NEGATIVE Final   Influenza B by PCR NEGATIVE NEGATIVE Final    Comment: (NOTE) The Xpert Xpress SARS-CoV-2/FLU/RSV plus assay is intended as an aid in the diagnosis of influenza from Nasopharyngeal swab specimens and should not be used as a sole basis for treatment. Nasal washings and aspirates are unacceptable for Xpert Xpress SARS-CoV-2/FLU/RSV testing.  Fact Sheet for Patients: EntrepreneurPulse.com.au  Fact Sheet for Healthcare Providers: IncredibleEmployment.be  This test is not yet approved or cleared by the [place] FDA and has been authorized for detection and/or diagnosis of SARS-CoV-2 by FDA under an Emergency Use Authorization (EUA). This EUA will remain in effect (meaning this test can be used) for the duration of the COVID-19 declaration under Section 564(b)(1) of the Act, 21 U.S.C. section 360bbb-3(b)(1), unless the authorization is terminated or revoked.     Resp Syncytial Virus by PCR NEGATIVE NEGATIVE Final    Comment: (NOTE) Fact Sheet for Patients: EntrepreneurPulse.com.au  Fact Sheet for Healthcare Providers: IncredibleEmployment.be  This test is not yet approved or cleared by the [place] FDA and has been authorized for detection and/or diagnosis of SARS-CoV-2 by FDA under an Emergency Use Authorization (EUA). This EUA will remain in effect (meaning this test can be used) for the duration of the COVID-19 declaration under Section 564(b)(1) of the Act, 21 U.S.C. section 360bbb-3(b)(1), unless the authorization is terminated or revoked.  Performed at [facility], [place] [address], [place], [place] [address]      Labs: BNP (last 3 results) Recent Labs    04/29/22 0500 07/06/22 0904 07/07/22 0932  BNP 393.6* 266.1* [id number]*   Basic Metabolic Panel: Recent Labs  Lab 07/08/22 0439 07/09/22 0503 07/10/22 0428 07/11/22 0259 07/12/22 0448  NA  134* 132* 133* 134* 132*  K 4.0 3.7 4.3 4.4 3.9  CL 101 98 100 101 100  CO2 22 22 24 23 23  $ GLUCOSE 123* 116* 121* 109* 108*  BUN 27* 26* 21 17 14  $ CREATININE 1.04 1.06 1.03 1.03 0.85  CALCIUM 8.7* 8.6* 8.5* 8.7* 8.7*  MG 2.2 2.5*  --  2.4 2.3  PHOS 3.7 3.1  --  3.3 3.7   Liver Function Tests: Recent Labs  Lab 07/06/22 0904 07/08/22 0439 07/09/22 0503 07/11/22 0259 07/12/22 0448  AST 38  --   --   --   --  ALT 12  --   --   --   --   ALKPHOS 39  --   --   --   --   BILITOT 0.6  --   --   --   --   PROT 5.0*  --   --   --   --   ALBUMIN 3.1* 3.0* 3.0* 2.9* 2.9*   No results for input(s): "LIPASE", "AMYLASE" in the last 168 hours. No results for input(s): "AMMONIA" in the last 168 hours. CBC: Recent Labs  Lab 07/08/22 0439 07/08/22 1742 07/09/22 0503 07/10/22 0428 07/11/22 0259 07/12/22 0448  WBC 16.0*  --  13.7* 10.3 10.6* 10.2  NEUTROABS 13.1*  --   --   --   --   --   HGB 7.9* 8.5* 8.1* 7.9* 8.1* 7.9*  HCT 24.3* 25.5* 25.5* 24.7* 25.1* 25.0*  MCV 91.7  --  91.4 91.1 90.6 89.9  PLT 159  --  171 192 200 217   Cardiac Enzymes: No results for input(s): "CKTOTAL", "CKMB", "CKMBINDEX", "TROPONINI" in the last 168 hours. BNP: Invalid input(s): "POCBNP" CBG: No results for input(s): "GLUCAP" in the last 168 hours. D-Dimer No results for input(s): "DDIMER" in the last 72 hours. Hgb A1c No results for input(s): "HGBA1C" in the last 72 hours. Lipid Profile No results for input(s): "CHOL", "HDL", "LDLCALC", "TRIG", "CHOLHDL", "LDLDIRECT" in the last 72 hours. Thyroid function studies No results for input(s): "TSH", "T4TOTAL", "T3FREE", "THYROIDAB" in the last 72 hours.  Invalid input(s): "FREET3" Anemia work up Recent Labs    07/10/22 0428  VITAMINB12 544  FOLATE 11.1  FERRITIN 21*  TIBC 384  IRON 17*  RETICCTPCT 6.8*   Urinalysis No results found for: "COLORURINE", "APPEARANCEUR", "LABSPEC", "PHURINE", "GLUCOSEU", "HGBUR", "BILIRUBINUR", "KETONESUR",  "PROTEINUR", "UROBILINOGEN", "NITRITE", "LEUKOCYTESUR" Sepsis Labs Recent Labs  Lab 07/09/22 0503 07/10/22 0428 07/11/22 0259 07/12/22 0448  WBC 13.7* 10.3 10.6* 10.2   Microbiology Recent Results (from the past 240 hour(s))  Resp panel by RT-PCR (RSV, Flu A&B, Covid) Anterior Nasal Swab     Status: None   Collection Time: 07/06/22  8:08 AM   Specimen: Anterior Nasal Swab  Result Value Ref Range Status   SARS Coronavirus 2 by RT PCR NEGATIVE NEGATIVE Final   Influenza A by PCR NEGATIVE NEGATIVE Final   Influenza B by PCR NEGATIVE NEGATIVE Final    Comment: (NOTE) The Xpert Xpress SARS-CoV-2/FLU/RSV plus assay is intended as an aid in the diagnosis of influenza from Nasopharyngeal swab specimens and should not be used as a sole basis for treatment. Nasal washings and aspirates are unacceptable for Xpert Xpress SARS-CoV-2/FLU/RSV testing.  Fact Sheet for Patients: EntrepreneurPulse.com.au  Fact Sheet for Healthcare Providers: IncredibleEmployment.be  This test is not yet approved or cleared by the [place] FDA and has been authorized for detection and/or diagnosis of SARS-CoV-2 by FDA under an Emergency Use Authorization (EUA). This EUA will remain in effect (meaning this test can be used) for the duration of the COVID-19 declaration under Section 564(b)(1) of the Act, 21 U.S.C. section 360bbb-3(b)(1), unless the authorization is terminated or revoked.     Resp Syncytial Virus by PCR NEGATIVE NEGATIVE Final    Comment: (NOTE) Fact Sheet for Patients: EntrepreneurPulse.com.au  Fact Sheet for Healthcare Providers: IncredibleEmployment.be  This test is not yet approved or cleared by the [place] FDA and has been authorized for detection and/or diagnosis of SARS-CoV-2 by FDA under an Emergency Use Authorization (EUA). This EUA will remain  in effect (meaning this test can be used) for the  duration of the COVID-19 declaration under Section 564(b)(1) of the Act, 21 U.S.C. section 360bbb-3(b)(1), unless the authorization is terminated or revoked.  Performed at [facility], [place] [address], [place],  [address]      Time coordinating discharge: Over 30 minutes  SIGNED:   Phillips Climes, MD  [facility] 07/12/2022, 1:56 PM Pager   If 7PM-7AM, please contact night-coverage [email or web address]

## 2022-07-12 NOTE — Progress Notes (Signed)
Rounding Note    Patient Name: Derrick Alexander Date of Encounter: 07/12/2022  [place] Cardiologist: Pixie Casino, MD   Subjective   Sitting up in bed finishing up his breakfast.  Wife at bedside.  No new complaints.  No significant shortness of breath this morning.  No bleeding.  Inpatient Medications    Scheduled Meds:  (feeding supplement) PROSource Plus  30 mL Oral BID BM   feeding supplement  1 Container Oral BID BM   latanoprost  1 drop Both Eyes QHS   metoprolol tartrate  12.5 mg Oral BID   mometasone-formoterol  2 puff Inhalation BID   multivitamin with minerals  1 tablet Oral Daily   pantoprazole (PROTONIX) IV  40 mg Intravenous Q12H   rosuvastatin  20 mg Oral Daily   sodium chloride flush  3 mL Intravenous Q12H   Continuous Infusions:  lactated ringers 10 mL/hr at 07/10/22 1126   PRN Meds: acetaminophen **OR** acetaminophen, albuterol, hydrALAZINE, polyvinyl alcohol   Vital Signs    Vitals:   07/11/22 2002 07/12/22 0000 07/12/22 0500 07/12/22 0801  BP: (!) 131/58 (!) 110/59  (!) 104/45  Pulse: 82 64  70  Resp:  17  20  Temp:    98 F (36.7 C)  TempSrc:    Oral  SpO2:  91%  94%  Weight:   124.3 kg   Height:        Intake/Output Summary (Last 24 hours) at 07/12/2022 1206 Last data filed at 07/12/2022 0800 Gross per 24 hour  Intake 840 ml  Output 750 ml  Net 90 ml      07/12/2022    5:00 AM 07/11/2022    5:00 AM 07/08/2022    4:12 AM  Last 3 Weights  Weight (lbs) 274 lb 0.5 oz 274 lb 4 oz 269 lb 10 oz  Weight (kg) 124.3 kg 124.4 kg 122.3 kg      Telemetry    Sinus rhythm frequent PVCs, occasional bigeminy pattern- Personally Reviewed  ECG    No new.  Original EKG does show subtle ST segment depression diffusely with A-fib.- Personally Reviewed  Physical Exam   GEN: No acute distress.   Neck: No JVD Cardiac: Regular rhythm with frequent PVCs, ectopy, no changes Respiratory: Normal respiratory effort neuro:  Nonfocal   Psych: Normal affect   Labs    High Sensitivity Troponin:   Recent Labs  Lab 07/06/22 0904 07/06/22 1849 07/07/22 0932 07/07/22 1128  TROPONINIHS 796* 3,612* 3,438* 4,113*     Chemistry Recent Labs  Lab 07/06/22 0904 07/07/22 0932 07/09/22 0503 07/10/22 0428 07/11/22 0259 07/12/22 0448  NA 135   < > 132* 133* 134* 132*  K 4.8   < > 3.7 4.3 4.4 3.9  CL 104   < > 98 100 101 100  CO2 19*   < > 22 24 23 23  $ GLUCOSE 139*   < > 116* 121* 109* 108*  BUN 36*   < > 26* 21 17 14  $ CREATININE 1.19   < > 1.06 1.03 1.03 0.85  CALCIUM 8.5*   < > 8.6* 8.5* 8.7* 8.7*  MG  --    < > 2.5*  --  2.4 2.3  PROT 5.0*  --   --   --   --   --   ALBUMIN 3.1*   < > 3.0*  --  2.9* 2.9*  AST 38  --   --   --   --   --  ALT 12  --   --   --   --   --   ALKPHOS 39  --   --   --   --   --   BILITOT 0.6  --   --   --   --   --   GFRNONAA >60   < > >60 >60 >60 >60  ANIONGAP 12   < > 12 9 10 9   $ < > = values in this interval not displayed.    Lipids  Recent Labs  Lab 07/08/22 0439  CHOL 77  TRIG 119  HDL 28*  LDLCALC 25  CHOLHDL 2.8    Hematology Recent Labs  Lab 07/10/22 0428 07/11/22 0259 07/12/22 0448  WBC 10.3 10.6* 10.2  RBC 2.71*  2.76* 2.77* 2.78*  HGB 7.9* 8.1* 7.9*  HCT 24.7* 25.1* 25.0*  MCV 91.1 90.6 89.9  MCH 29.2 29.2 28.4  MCHC 32.0 32.3 31.6  RDW 15.6* 15.5 15.8*  PLT 192 200 217   Thyroid No results for input(s): "TSH", "FREET4" in the last 168 hours.  BNP Recent Labs  Lab 07/06/22 0904 07/07/22 0932  BNP 266.1* 522.0*    DDimer No results for input(s): "DDIMER" in the last 168 hours.   Radiology    No results found.  Cardiac Studies   Echocardiogram as above, normal ejection fraction, previously moderate aortic stenosis.  Patient Profile     77 y.o. male with GI bleed, 3 unit transfusion, elevated troponin 4100, prior gastric AVMs, normal ejection fraction, moderate aortic stenosis, Xarelto for chronic anticoagulation.  Assessment & Plan     Acute GI bleed -Colonoscopy Dr. Benson Norway 07/10/22 -multiple cecal ulcerations.  Dr. Benson Norway is okay with restarting anticoagulation.  Instead of Xarelto lets use Eliquis 5 mg twice a day.  3 unit transfusion previously.  Did better with his second iron infusion.  Did not feel warm all over.  Type 2 MI -Troponin elevation 4000.  He does not have any previously diagnosed coronary artery disease however his hemoglobin was 5.9 on admission.  This is likely a supply demand oxygen mismatch.  Could have underlying CAD however.  In looking back at his previous EKG, original one did show some evidence of diffuse ST segment depression which could be indicative of global ischemia.  Even though this may not represent plaque rupture, this does portend a worsened prognosis. -We will set up follow-up appointment.  And that follow-up appointment if he is remaining GI bleed free, consider diagnostic heart catheterization at that time to define his anatomy.  He would need to hold Eliquis.  Atrial fibrillation paroxysmal - Holding on anticoagulation at this point.  Was previously on Xarelto.  Changing to Eliquis.  Appreciate GIs recommendations -Consider Watchman after discharge. -Currently well rate controlled.  Metoprolol 12.5 twice daily.  No changes    Aortic stenosis - Previously considered moderate in severity.  This is one of the reasons why think cardiac catheterization would be helpful.  If for instance he had underlying triple-vessel disease and required revascularization via bypass, aortic valve replacement would take place at that time.  His aortic stenosis is not severe however.    Acute on chronic diastolic heart failure - Large degree of this is deconditioning with combination of morbid obesity BMI 44 in addition to recent anemia.  Does not appear to be excessively fluid overloaded at this time.  Okay with discharge from cardiology perspective.     For questions or updates, please contact [facility] Please consult [email or web address] for contact info under        Signed, Candee Furbish, MD  07/12/2022, 12:06 PM

## 2022-07-12 NOTE — Discharge Instructions (Signed)
Follow with Primary MD [name], MD in 7 days   Get CBC, CMP,  checked  by Primary MD next visit.    Activity: As tolerated with Full fall precautions use walker/cane & assistance as needed   Disposition Home    Diet: Heart Healthy  On your next visit with your primary care physician please Get Medicines reviewed and adjusted.   Please request your Prim.MD to go over all Hospital Tests and Procedure/Radiological results at the follow up, please get all Hospital records sent to your Prim MD by signing hospital release before you go home.   If you experience worsening of your admission symptoms, develop shortness of breath, life threatening emergency, suicidal or homicidal thoughts you must seek medical attention immediately by calling 911 or calling your MD immediately  if symptoms less severe.  You Must read complete instructions/literature along with all the possible adverse reactions/side effects for all the Medicines you take and that have been prescribed to you. Take any new Medicines after you have completely understood and accpet all the possible adverse reactions/side effects.   Do not drive, operating heavy machinery, perform activities at heights, swimming or participation in water activities or provide baby sitting services if your were admitted for syncope or siezures until you have seen by Primary MD or a Neurologist and advised to do so again.  Do not drive when taking Pain medications.    Do not take more than prescribed Pain, Sleep and Anxiety Medications  Special Instructions: If you have smoked or chewed Tobacco  in the last 2 yrs please stop smoking, stop any regular Alcohol  and or any Recreational drug use.  Wear Seat belts while driving.   Please note  You were cared for by a hospitalist during your hospital stay. If you have any questions about your discharge medications or the care you received while you were in the hospital after you are discharged,  you can call the unit and asked to speak with the hospitalist on call if the hospitalist that took care of you is not available. Once you are discharged, your primary care physician will handle any further medical issues. Please note that NO REFILLS for any discharge medications will be authorized once you are discharged, as it is imperative that you return to your primary care physician (or establish a relationship with a primary care physician if you do not have one) for your aftercare needs so that they can reassess your need for medications and monitor your lab values.

## 2022-07-13 ENCOUNTER — Other Ambulatory Visit (HOSPITAL_COMMUNITY): Payer: Self-pay

## 2022-07-26 DIAGNOSIS — D509 Iron deficiency anemia, unspecified: Secondary | ICD-10-CM | POA: Diagnosis not present

## 2022-08-02 DIAGNOSIS — G4733 Obstructive sleep apnea (adult) (pediatric): Secondary | ICD-10-CM | POA: Diagnosis not present

## 2022-08-03 DIAGNOSIS — J410 Simple chronic bronchitis: Secondary | ICD-10-CM | POA: Diagnosis not present

## 2022-08-04 ENCOUNTER — Telehealth: Payer: Self-pay | Admitting: Cardiology

## 2022-08-04 NOTE — Telephone Encounter (Signed)
Call to patient's wife, [name] to advise she can bring the unopened bottles of Xarelto to her next appointment and we will take care of them for her.She verbalized understanding and had no questions.

## 2022-08-04 NOTE — Telephone Encounter (Signed)
Pt c/o medication issue: Patient was changed from Xarelto to Eliquis 1. Name of Medication: patient    2. How are you currently taking this medication (dosage and times per day)?   3. Are you having a reaction (difficulty breathing--STAT)?   4. What is your medication issue? Patient have bottles of Xarelto that have never been opened. wife wants to know what can she do with them?

## 2022-08-08 ENCOUNTER — Ambulatory Visit (HOSPITAL_BASED_OUTPATIENT_CLINIC_OR_DEPARTMENT_OTHER): Payer: Medicare Other | Admitting: Pulmonary Disease

## 2022-08-08 ENCOUNTER — Encounter (HOSPITAL_BASED_OUTPATIENT_CLINIC_OR_DEPARTMENT_OTHER): Payer: Self-pay | Admitting: Pulmonary Disease

## 2022-08-08 VITALS — BP 108/78 | HR 66 | Ht 65.0 in | Wt 239.0 lb

## 2022-08-08 DIAGNOSIS — J449 Chronic obstructive pulmonary disease, unspecified: Secondary | ICD-10-CM | POA: Diagnosis not present

## 2022-08-08 DIAGNOSIS — G4733 Obstructive sleep apnea (adult) (pediatric): Secondary | ICD-10-CM | POA: Diagnosis not present

## 2022-08-08 MED ORDER — FLUTICASONE-SALMETEROL 250-50 MCG/ACT IN AEPB: 1.0000 | INHALATION_SPRAY | Freq: Two times a day (BID) | RESPIRATORY_TRACT | 5 refills | Status: DC

## 2022-08-08 NOTE — Progress Notes (Signed)
Cardiology Office Note:    Date:  08/09/2022   ID:  Derrick Alexander, DOB April 08, 1946, MRN [id number]  PCP:  [name], [place] [facility] Cardiologist:  Pixie Casino, MD Electrophysiologist:  Constance Haw, MD     Referring MD: [name], MD   CC: hospital f/u  History of Present Illness:    Derrick Alexander is a 77 y.o. male with a hx of LBBB, NSVT, atrial fibrillation (failed flecainide and amio, s/p PVI AF ablation 08/2020 on Eliquis), hypertension, HLD, chronic diastolic heart failure, OSA on CPAP, COPD, history of GI bleed, history of gastric AVMs, QT prolongation, history of syncope.  He had episodes of syncope ~2017, wore monitor that showed 16-beat run of NSVT and bradycardia down to 30s when sleeping. He also has h/o AF, failed flecainide and amio. s/p PVI AF ablation 08/2020   He was admitted 04/27/2022 to 04/30/2022 for acute upper GI bleed with gastric AVM that was treated with argon plasma coagulation.    He was evaluated on 06/02/2022 by Pamala Duffel, NP, at that time he was recovering from his admission in December and doing relatively well.  He was admitted on 07/06/2022 to 07/12/2022 for acute GI bleed, he also reported some chest pain at this time, initial troponin was 796 and trended upward to 4100.  His EKG showed some ST depression in his lateral leads.  He was transfused with 4 units, his Xarelto was held, and he was ultimately transitioned to Eliquis.  He remained without complaints of chest pain, cardiology was consulted with further ischemic evaluation to be completed as outpatient.  He presents today accompanied by his wife for follow-up after recent hospitalization as outlined above.  He states he is getting his strength back and able to do a little more each day.  He was able to walk around the grocery store with his wife the previous day, when he had previously been needing to use the motorized shopping cart.  He has occasional pedal  edema that he attributes to dietary indiscretions.  He denies chest pain, palpitations, dyspnea, pnd, orthopnea, n, v, dizziness, syncope,  weight gain, or early satiety.  He denies hematochezia, hematuria, or hemoptysis.  Past Medical History:  Diagnosis Date   A-fib [facility])    Abnormal EKG 06/18/2015   Aortic atherosclerosis (HCC)    AV malformation of GI tract    Bradycardia    Carotid artery disease (HCC)    COPD (chronic obstructive pulmonary disease) (HCC)    COPD with exacerbation (HCC)    Diastolic dysfunction    Faintness 06/18/2015   GIB (gastrointestinal bleeding)    Hepatic steatosis    by imaging   Hypertension    LBBB (left bundle branch block) 06/18/2015   Moderate aortic stenosis    NSVT (nonsustained ventricular tachycardia) (HCC)    Obesity    OSA on CPAP    PVC's (premature ventricular contractions)    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 (LAFB)    Syncope 08/06/2015   Tobacco use    V-tach ([place]) 08/06/2015    Past Surgical History:  Procedure Laterality Date   ATRIAL FIBRILLATION ABLATION N/A 09/17/2020   Procedure: ATRIAL FIBRILLATION ABLATION;  Surgeon: Constance Haw, MD;  Location: [place] [facility];  Service: Cardiovascular;  Laterality: N/A;   BIOPSY  07/10/2022   Procedure: BIOPSY;  Surgeon: Carol Ada, MD;  Location: [place];  Service: Gastroenterology;;   COLONOSCOPY WITH PROPOFOL  N/A 03/07/2019   Procedure: COLONOSCOPY WITH PROPOFOL;  Surgeon: Carol Ada, MD;  Location: [facility];  Service: Endoscopy;  Laterality: N/A;   COLONOSCOPY WITH PROPOFOL N/A 07/10/2022   Procedure: COLONOSCOPY WITH PROPOFOL;  Surgeon: Carol Ada, MD;  Location: [place];  Service: Gastroenterology;  Laterality: N/A;   ESOPHAGOGASTRODUODENOSCOPY (EGD) WITH PROPOFOL N/A 04/28/2022   Procedure: ESOPHAGOGASTRODUODENOSCOPY (EGD) WITH PROPOFOL;  Surgeon: Carol Ada, MD;  Location: [facility];  Service:  Gastroenterology;  Laterality: N/A;   ESOPHAGOGASTRODUODENOSCOPY (EGD) WITH PROPOFOL N/A 07/07/2022   Procedure: ESOPHAGOGASTRODUODENOSCOPY (EGD) WITH PROPOFOL;  Surgeon: Carol Ada, MD;  Location: [place];  Service: Gastroenterology;  Laterality: N/A;   HOT HEMOSTASIS N/A 04/28/2022   Procedure: HOT HEMOSTASIS (ARGON PLASMA COAGULATION/BICAP);  Surgeon: Carol Ada, MD;  Location: [facility];  Service: Gastroenterology;  Laterality: N/A;   HOT HEMOSTASIS N/A 07/07/2022   Procedure: HOT HEMOSTASIS (ARGON PLASMA COAGULATION/BICAP);  Surgeon: Carol Ada, MD;  Location: [place];  Service: Gastroenterology;  Laterality: N/A;   POLYPECTOMY  03/07/2019   Procedure: POLYPECTOMY;  Surgeon: Carol Ada, MD;  Location: [facility];  Service: Endoscopy;;   POLYPECTOMY  07/10/2022   Procedure: POLYPECTOMY;  Surgeon: Carol Ada, MD;  Location: [facility];  Service: Gastroenterology;;    Current Medications: Current Meds  Medication Sig   acetaminophen (TYLENOL) 325 MG tablet Take 2 tablets (650 mg total) by mouth every 6 (six) hours as needed for mild pain (or Fever >/= 101).   albuterol (PROVENTIL) (2.5 MG/3ML) 0.083% nebulizer solution Take 3 mLs (2.5 mg total) by nebulization every 4 (four) hours as needed for wheezing or shortness of breath.   albuterol (VENTOLIN HFA) 108 (90 Base) MCG/ACT inhaler Inhale 2 puffs into the lungs every 6 (six) hours as needed for wheezing or shortness of breath.   apixaban (ELIQUIS) 5 MG TABS tablet Take 1 tablet (5 mg total) by mouth 2 (two) times daily.   cetirizine (ZYRTEC) 10 MG tablet Take 10 mg by mouth daily.   famotidine (PEPCID) 20 MG tablet Take 20 mg by mouth at bedtime.    fluticasone-salmeterol (WIXELA INHUB) 250-50 MCG/ACT AEPB Inhale 1 puff into the lungs in the morning and at bedtime.   furosemide (LASIX) 40 MG tablet Take 1.5 tablets (60 mg total) by mouth 2 (two) times daily.   latanoprost (XALATAN) 0.005 % ophthalmic solution  Place 1 drop into both eyes at bedtime.   metoprolol tartrate (LOPRESSOR) 25 MG tablet Take 0.5 tablets (12.5 mg total) by mouth 2 (two) times daily.   Polyethyl Glycol-Propyl Glycol (SYSTANE OP) Place 1 drop into both eyes daily as needed (dry eyes).   rosuvastatin (CRESTOR) 20 MG tablet Take 20 mg by mouth daily.     Allergies:   Cardizem [diltiazem], Pacerone [amiodarone], and Tambocor [flecainide]   Social History   Socioeconomic History   Marital status: Married    Spouse name: Not on file   Number of children: Not on file   Years of education: Not on file   Highest education level: Not on file  Occupational History   Occupation: retired  Tobacco Use   Smoking status: Former    Packs/day: 2.00    Years: 60.00    Additional pack years: 0.00    Total pack years: 120.00    Types: Cigarettes    Quit date: 06/13/2017    Years since quitting: 5.1   Smokeless tobacco: Never  Vaping Use   Vaping Use: Never used  Substance and Sexual Activity   Alcohol  use: No    Alcohol/week: 0.0 standard drinks of alcohol   Drug use: No   Sexual activity: Not on file  Other Topics Concern   Not on file  Social History Narrative   Not on file   Social Determinants of Health   Financial Resource Strain: Not on file  Food Insecurity: No Food Insecurity (04/28/2022)   Hunger Vital Sign    Worried About Running Out of Food in the Last Year: Never true    Ran Out of Food in the Last Year: Never true  Transportation Needs: No Transportation Needs (04/28/2022)   PRAPARE - Hydrologist (Medical): No    Lack of Transportation (Non-Medical): No  Physical Activity: Not on file  Stress: Not on file  Social Connections: Not on file     Family History: The patient's family history includes COPD in his brother; Diabetes in his mother; Heart disease in his brother.  ROS:   Please see the history of present illness.    All other systems reviewed and are  negative.  EKGs/Labs/Other Studies Reviewed:    The following studies were reviewed today:  TTE 03/27/2022  1. Left ventricular ejection fraction, by estimation, is 60 to 65%. The left ventricle has normal function. The left ventricle has no regional wall motion abnormalities. There is moderate left ventricular hypertrophy. Left ventricular diastolic parameters are consistent with Grade II diastolic dysfunction (pseudonormalization). Elevated left ventricular end-diastolic pressure. The E/e' is 22.   2. Right ventricular systolic function is normal. The right ventricular size is normal. There is mildly elevated pulmonary artery systolic pressure. The estimated right ventricular systolic pressure is [id number] mmHg.   3. The mitral valve is abnormal. Trivial mitral valve regurgitation.   4. The aortic valve is calcified. Aortic valve regurgitation is not visualized. Moderate aortic valve stenosis. Aortic valve area, by VTI measures 1.06 cm. Aortic valve mean gradient measures 26.4 mmHg. Aortic valve Vmax measures 3.62 m/s.   5. The inferior vena cava is normal in size with <50% respiratory variability, suggesting right atrial pressure of 8 mmHg.    Zio 02/2022 Patch Wear Time:  5 days and 2 hours   Predominant rhythm was sinus rhythm 9% atrial fibrillation burden. 1.3% supraventricular ectopy 12.1% ventricular ectopy  EKG:  EKG is  ordered today.  The ekg ordered today demonstrates sinus bradycardia with PVCs, bigeminy.   Recent Labs: 05/05/2022: NT-Pro BNP 756 07/06/2022: ALT 12 07/07/2022: B Natriuretic Peptide 522.0 07/12/2022: BUN 14; Creatinine, Ser 0.85; Hemoglobin 7.9; Magnesium 2.3; Platelets 217; Potassium 3.9; Sodium 132  Recent Lipid Panel    Component Value Date/Time   CHOL 77 07/08/2022 0439   TRIG 119 07/08/2022 0439   HDL 28 (L) 07/08/2022 0439   CHOLHDL 2.8 07/08/2022 0439   VLDL 24 07/08/2022 0439   LDLCALC 25 07/08/2022 0439     Risk Assessment/Calculations:     CHA2DS2-VASc Score = 5   This indicates a 7.2% annual risk of stroke. The patient's score is based upon: CHF History: 1 HTN History: 1 Diabetes History: 0 Stroke History: 0 Vascular Disease History: 1 Age Score: 2 Gender Score: 0               Physical Exam:    VS:  BP 112/78   Pulse 85   Ht 5\' 6"  (1.676 m)   Wt 237 lb 6.4 oz (107.7 kg)   SpO2 99%   BMI 38.32 kg/m     Wt Readings  from Last 3 Encounters:  08/09/22 237 lb 6.4 oz (107.7 kg)  08/08/22 239 lb (108.4 kg)  07/12/22 274 lb 0.5 oz (124.3 kg)     GEN:  Well nourished, well developed in no acute distress HEENT: Normal NECK: No JVD; No carotid bruits LYMPHATICS: No lymphadenopathy CARDIAC: regular rate noted with apparent PVCs, 3/6 murmur LSB, rubs, gallops RESPIRATORY:  Clear to auscultation without rales, wheezing or rhonchi  ABDOMEN: Soft, non-tender, non-distended MUSCULOSKELETAL: trace pedal edema at sock line R.L,  No deformity  SKIN: Warm and dry NEUROLOGIC:  Alert and oriented x 3 PSYCHIATRIC:  Normal affect   ASSESSMENT:    1. Paroxysmal atrial fibrillation with RVR ([place])   2. H/O: GI bleed   3. Essential hypertension   4. NSVT (nonsustained ventricular tachycardia) ([place])   5. Hyperlipidemia, unspecified hyperlipidemia type    PLAN:    In order of problems listed above:  PAF - In sinus rhythm with frequent PVCs, bigeminy pattern. Failed flecainide and amio. S/p PVI AF ablation 08/2020. CHA2DS2-VASc Score = 5 [CHF History: 1, HTN History: 1, Diabetes History: 0, Stroke History: 0, Vascular Disease History: 1, Age Score: 2, Gender Score: 0].  Therefore, the patient's annual risk of stroke is 7.2 %.    He was changed from Xarelto to Eliquis at recent hospitalization. Continue Eliquis (no indication for dose reduction), continue metoprolol.   History of GI bleed - denies hematochezia, recent CBC by GI was stable at 10.4.   Hypertension - BP today is well controlled at 112/78. Continue current  antihypertensive medication regimen.   NSVT - denies palpitations. Zio in 10/23 with 12.1% ventricular ectopy. His EKG today revealed SR with frequent PVC's, bigeminy pattern. Continue metoprolol. Followed by EP. Will check TSH, mag, BMET today.   HLD-LDL on 07/08/2022 was well-controlled at 25, continue Crestor.    Disposition - check TSH, Mag, BMET. Return in 3 months.         Medication Adjustments/Labs and Tests Ordered: Current medicines are reviewed at length with the patient today.  Concerns regarding medicines are outlined above.  Orders Placed This Encounter  Procedures   Magnesium   TSH   Basic metabolic panel   EKG [id number]   No orders of the defined types were placed in this encounter.   Patient Instructions  Medication Instructions:   Your physician recommends that you continue on your current medications as directed. Please refer to the Current Medication list given to you today.  *If you need a refill on your cardiac medications before your next appointment, please call your pharmacy*  Lab Work: Your physician recommends that you return for lab work TODAY:  BMP Magnesium TSH If you have labs (blood work) drawn today and your tests are completely normal, you will receive your results only by: [place] (if you have MyChart) OR A paper copy in the mail If you have any lab test that is abnormal or we need to change your treatment, we will call you to review the results.  Testing/Procedures: NONE ordered at this time of appointment   Follow-Up: At [facility], you and your health needs are our priority.  As part of our continuing mission to provide you with exceptional heart care, we have created designated Provider Care Teams.  These Care Teams include your primary Cardiologist (physician) and Advanced Practice Providers (APPs -  Physician Assistants and Nurse Practitioners) who all work together to provide you with the care you need, when you  need  it.  Your next appointment:   3 month(s)  Provider:   Pixie Casino, MD     Other Instructions     Signed, Trudi Ida, NP  08/09/2022 12:36 PM    [place]

## 2022-08-08 NOTE — Patient Instructions (Signed)
COPD --Pulmonary rehab has contacted patient but he was ill at the time. --CONTINUE Wixela 250-50 mcg ONE puff in the morning and in the evening. Rinse mouth out after use --CONTINUE Albuterol AS NEEDED for shortness of breath or wheezing. OK to use prior to exercise --Encourage regular aerobic exercise up to 20 minutes daily   OSA - managed by PCP Self-reports compliance with CPAP --Counseled on sleep hygiene --Counseled on weight loss/maintenance of healthy weight --Counseled NOT to drive if/when sleepy --Advised patient to wear CPAP for at least 4 hours each night for greater than 70% of the time to avoid the machine being repossessed by insurance.

## 2022-08-08 NOTE — Progress Notes (Signed)
Subjective:   PATIENT ID: Ferdinand Cava GENDER: male DOB: [birth year], MRN: [id number]   HPI  Chief Complaint  Patient presents with   Follow-up    Went to hospital stayed 4 days large intestine     Reason for Visit: Follow-up for shortness of breath  Mr. Roper Ladley is a 77 year old male former smoker with COPD, OSA, atrial fibrillation, history of VT, hypertension who presents for follow-up for COPD  Initial consult He was previously seen by Dr. Gwenlyn Perking pulmonary on 08/01/2017.  He has been lost to follow-up since then. He reports worsening shortness of breath in the last six months. He reports difficulty walking and carrying things without feeling breathless. Has to take breaks when walking down the driveway. Walks slowly up the stairs. Shortness of breath is associated with wheezing with exertion. Minimal cough. Does wheeze at night. Wears CPAP on average 5 hours at night.  He is currently not on inhalers. He reports weight gain 20 lb in the last 6 months. Not active at baseline.  11/15/2021 Since our last visit he has been compliant with Spiriva. However not sure if he feels a difference. Sometimes standing for long periods of time including in the shower, he has difficulty and will need rest. He does walk a few minutes to the shed. Denies wheezing or coughing.  02/14/22 Since our last visit, he has been off Stiolto for two weeks. He is not planning to buy any meds due to cost. He is hoping that he doesn't need to go to the hospital by the end of the year. When he was on it he had improved shortness of breath and improved wheezing. Now his wife reports he is noticeably more short of breath and not able to walk as far. He is compliant with his CPAP. He does not know if his quality of sleep is improved but has noticed that he is not requiring as many naps during the day and no longer falling asleep while talking.   05/09/22 Since our last visit he was recently hospitalized last  week for GI bleed secondary to multiple AVMs treated with APC. Since then his cough has worsened. Was not treated for COPD exacerbation while inpatient and had no O2 needs. No fevers or chills. Swallows ok. Associated with some wheezing and shortness of breath that worsens at night. Not active at baseline but has not been for the majority of this year. Wife present and agrees that has had minimal activity.  08/08/22 Since our last visit he has been compliant with Advair and no further leg tremors. Denies cough or wheezing. Shortness of breath with exertion due to atrial fibrillation and lack of activity since being discharged from hospital. He was hospitalized from 07/06/22-07/12/22 for GI bleeding with erythematous gastric mucosa and colonoscopy with multiple ulcers in ascending colon and cecum. He is able to walk to mailbox now. Compliant with CPAP nightly.  Social History: Former smoker. Quit in 2017. >99 pack years. Previously 3 packs daily Previously broke his back as a teenager on trampoline  Past Medical History:  Diagnosis Date   A-fib ([place])    Abnormal EKG 06/18/2015   Aortic atherosclerosis ([facility])    AV malformation of GI tract    Bradycardia    Carotid artery disease (H[facility])    COPD (chronic obstructive pulmonary disease) (H[facility])    COPD with exacerbation ([facility])    Diastolic dysfunction    Faintness 06/18/2015   GIB (gastrointestinal bleeding)    Hepatic  steatosis    by imaging   Hypertension    LBBB (left bundle branch block) 06/18/2015   Moderate aortic stenosis    NSVT (nonsustained ventricular tachycardia) (HCC)    Obesity    OSA on CPAP    PVC's (premature ventricular contractions)    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 (LAFB)    Syncope 08/06/2015   Tobacco use    V-tach ([place]) 08/06/2015     Family History  Problem Relation Age of Onset   Heart disease Brother    COPD Brother    Diabetes Mother      Social History   Occupational  History   Occupation: retired  Tobacco Use   Smoking status: Former    Packs/day: 2.00    Years: 60.00    Additional pack years: 0.00    Total pack years: 120.00    Types: Cigarettes    Quit date: 06/13/2017    Years since quitting: 5.1   Smokeless tobacco: Never  Vaping Use   Vaping Use: Never used  Substance and Sexual Activity   Alcohol use: No    Alcohol/week: 0.0 standard drinks of alcohol   Drug use: No   Sexual activity: Not on file    Allergies  Allergen Reactions   Cardizem [Diltiazem] Shortness Of Breath   Pacerone [Amiodarone] Shortness Of Breath, Nausea Only and Other (See Comments)    Abdominal pain Dizziness   Tambocor [Flecainide] Shortness Of Breath     Outpatient Medications Prior to Visit  Medication Sig Dispense Refill   acetaminophen (TYLENOL) 325 MG tablet Take 2 tablets (650 mg total) by mouth every 6 (six) hours as needed for mild pain (or Fever >/= 101).     albuterol (PROVENTIL) (2.5 MG/3ML) 0.083% nebulizer solution Take 3 mLs (2.5 mg total) by nebulization every 4 (four) hours as needed for wheezing or shortness of breath. 75 mL 12   albuterol (VENTOLIN HFA) 108 (90 Base) MCG/ACT inhaler Inhale 2 puffs into the lungs every 6 (six) hours as needed for wheezing or shortness of breath. 8 g 6   cetirizine (ZYRTEC) 10 MG tablet Take 10 mg by mouth daily.     furosemide (LASIX) 40 MG tablet Take 1.5 tablets (60 mg total) by mouth 2 (two) times daily. 270 tablet 1   latanoprost (XALATAN) 0.005 % ophthalmic solution Place 1 drop into both eyes at bedtime.     metoprolol tartrate (LOPRESSOR) 25 MG tablet Take 0.5 tablets (12.5 mg total) by mouth 2 (two) times daily. 30 tablet 0   Polyethyl Glycol-Propyl Glycol (SYSTANE OP) Place 1 drop into both eyes daily as needed (dry eyes).     rosuvastatin (CRESTOR) 20 MG tablet Take 20 mg by mouth daily.     fluticasone-salmeterol (ADVAIR DISKUS) 250-50 MCG/ACT AEPB Inhale 1 puff into the lungs in the morning and at  bedtime. 60 each 5   apixaban (ELIQUIS) 5 MG TABS tablet Take 1 tablet (5 mg total) by mouth 2 (two) times daily. 60 tablet 0   famotidine (PEPCID) 20 MG tablet Take 20 mg by mouth at bedtime.      Ferrous Sulfate (IRON) 325 (65 Fe) MG TABS Take 1 tablet (325 mg total) by mouth daily. 30 tablet 0   pantoprazole (PROTONIX) 40 MG tablet Take 1 tablet (40 mg total) by mouth 2 (two) times daily before a meal. 60 tablet 0   No facility-administered medications prior to visit.    Review of Systems  Constitutional:  Negative for chills, diaphoresis, fever, malaise/fatigue and weight loss.  HENT:  Negative for congestion.   Respiratory:  Positive for shortness of breath. Negative for cough, hemoptysis, sputum production and wheezing.   Cardiovascular:  Negative for chest pain, palpitations and leg swelling.     Objective:   Vitals:   08/08/22 1606  BP: 108/78  Pulse: 66  SpO2: 96%  Weight: 239 lb (108.4 kg)  Height: 5\' 5"  (1.651 m)   SpO2: 96 % O2 Device: None (Room air)  Physical Exam: General: Well-appearing, no acute distress HENT: [place], AT Eyes: EOMI, no scleral icterus Respiratory: Clear to auscultation bilaterally.  No crackles, wheezing or rales Cardiovascular: RRR, -M/R/G, no JVD Extremities:-Edema,-tenderness Neuro: AAO x4, CNII-XII grossly intact Psych: Normal mood, normal affect  Data Reviewed:  Imaging: CXR 06/19/2017 mild cardiomegaly.  No infiltrate, effusion or edema. CT cardiac 09/10/2020-CAC score 361 which is 50th percentile.  Visualized lung parenchyma normal, no evidence pulmonary nodules/masses. CXR 04/29/22 - No acute issues CXR 07/06/22 - Normal  PFT: 08/01/2017 FVC 2.38 (74%) FEV1 1.73 (75%) ratio 71 TLC 103% RV 150% RV/TLC 147% DLCO 101% Interpretation mild obstructive lung disease with air trapping present normal gas exchange.  No significant bronchodilator response however does not preclude benefit of therapy.  Labs: CBC    Component Value Date/Time    WBC 10.2 07/12/2022 0448   RBC 2.78 (L) 07/12/2022 0448   HGB 7.9 (L) 07/12/2022 0448   HGB 7.8 (L) 05/05/2022 0956   HCT 25.0 (L) 07/12/2022 0448   HCT 23.7 (L) 05/05/2022 0956   PLT 217 07/12/2022 0448   PLT 212 05/05/2022 0956   MCV 89.9 07/12/2022 0448   MCV 88 05/05/2022 0956   MCH 28.4 07/12/2022 0448   MCHC 31.6 07/12/2022 0448   RDW 15.8 (H) 07/12/2022 0448   RDW 14.9 05/05/2022 0956   LYMPHSABS 1.3 07/08/2022 0439   MONOABS 1.5 (H) 07/08/2022 0439   EOSABS 0.0 07/08/2022 0439   BASOSABS 0.1 07/08/2022 0439   Absolute eos  08/01/2017-100 04/27/22 -100    Assessment & Plan:   Discussion: 77 year old male with COPD, atrial fibrillation, hx VT, HTN, recent GI bleed who presents for follow-up. COPD well controlled. Dyspnea likely related to deconditioning. Counseled on management including rehab, bronchodilators and infection prevention.  Prior inhalers: Stiolto - Dc'd due to cost  COPD --Pulmonary rehab has contacted patient but he was ill at the time. --CONTINUE Wixela 250-50 mcg ONE puff in the morning and in the evening. Rinse mouth out after use --CONTINUE Albuterol AS NEEDED for shortness of breath or wheezing. OK to use prior to exercise --Encourage regular aerobic exercise up to 20 minutes daily   OSA - managed by PCP Self-reports compliance with CPAP --Counseled on sleep hygiene --Counseled on weight loss/maintenance of healthy weight --Counseled NOT to drive if/when sleepy --Advised patient to wear CPAP for at least 4 hours each night for greater than 70% of the time to avoid the machine being repossessed by insurance.   Health Maintenance Immunization History  Administered Date(s) Administered   Influenza, High Dose Seasonal PF 06/14/2017   Influenza, Quadrivalent, Recombinant, Inj, Pf 06/14/2017, 01/29/2019   Pneumococcal Conjugate-13 12/12/2018   Pneumococcal Polysaccharide-23 06/14/2017   Tdap 11/06/2017   CT Lung Screen - not qualified. Due to  age  No orders of the defined types were placed in this encounter.  Meds ordered this encounter  Medications   fluticasone-salmeterol (WIXELA INHUB) 250-50 MCG/ACT AEPB  Sig: Inhale 1 puff into the lungs in the morning and at bedtime.    Dispense:  60 each    Refill:  5    Return in about 6 months (around 02/08/2023) for routine follow-up, with Dr. Loanne Drilling.  I have spent a total time of 33-minutes on the day of the appointment including chart review, data review, collecting history, coordinating care and discussing medical diagnosis and plan with the patient/family. Past medical history, allergies, medications were reviewed. Pertinent imaging, labs and tests included in this note have been reviewed and interpreted independently by me.  Kashmir Lysaght Rodman Pickle, MD [facility] Office Number [phone]

## 2022-08-09 ENCOUNTER — Ambulatory Visit: Payer: Medicare Other | Attending: General Practice | Admitting: Cardiology

## 2022-08-09 ENCOUNTER — Encounter: Payer: Self-pay | Admitting: General Practice

## 2022-08-09 VITALS — BP 112/78 | HR 85 | Ht 66.0 in | Wt 237.4 lb

## 2022-08-09 DIAGNOSIS — I480 Paroxysmal atrial fibrillation: Secondary | ICD-10-CM

## 2022-08-09 DIAGNOSIS — I10 Essential (primary) hypertension: Secondary | ICD-10-CM

## 2022-08-09 DIAGNOSIS — Z8719 Personal history of other diseases of the digestive system: Secondary | ICD-10-CM

## 2022-08-09 DIAGNOSIS — I4729 Other ventricular tachycardia: Secondary | ICD-10-CM | POA: Diagnosis not present

## 2022-08-09 DIAGNOSIS — E785 Hyperlipidemia, unspecified: Secondary | ICD-10-CM

## 2022-08-09 NOTE — Patient Instructions (Signed)
Medication Instructions:   Your physician recommends that you continue on your current medications as directed. Please refer to the Current Medication list given to you today.  *If you need a refill on your cardiac medications before your next appointment, please call your pharmacy*  Lab Work: Your physician recommends that you return for lab work TODAY:  BMP Magnesium TSH If you have labs (blood work) drawn today and your tests are completely normal, you will receive your results only by: [place] (if you have MyChart) OR A paper copy in the mail If you have any lab test that is abnormal or we need to change your treatment, we will call you to review the results.  Testing/Procedures: NONE ordered at this time of appointment   Follow-Up: At [facility], you and your health needs are our priority.  As part of our continuing mission to provide you with exceptional heart care, we have created designated Provider Care Teams.  These Care Teams include your primary Cardiologist (physician) and Advanced Practice Providers (APPs -  Physician Assistants and Nurse Practitioners) who all work together to provide you with the care you need, when you need it.  Your next appointment:   3 month(s)  Provider:   Pixie Casino, MD     Other Instructions

## 2022-08-10 ENCOUNTER — Telehealth: Payer: Self-pay

## 2022-08-10 ENCOUNTER — Telehealth: Payer: Self-pay | Admitting: Cardiology

## 2022-08-10 ENCOUNTER — Other Ambulatory Visit: Payer: Self-pay

## 2022-08-10 DIAGNOSIS — I4819 Other persistent atrial fibrillation: Secondary | ICD-10-CM

## 2022-08-10 LAB — BASIC METABOLIC PANEL WITH GFR
BUN/Creatinine Ratio: 4 — ABNORMAL LOW (ref 10–24)
BUN: 4 mg/dL — ABNORMAL LOW (ref 8–27)
CO2: 24 mmol/L (ref 20–29)
Calcium: 9.9 mg/dL (ref 8.6–10.2)
Chloride: 100 mmol/L (ref 96–106)
Creatinine, Ser: 0.95 mg/dL (ref 0.76–1.27)
Glucose: 123 mg/dL — ABNORMAL HIGH (ref 70–99)
Potassium: 4 mmol/L (ref 3.5–5.2)
Sodium: 140 mmol/L (ref 134–144)
eGFR: 82 mL/min/1.73

## 2022-08-10 LAB — MAGNESIUM: Magnesium: 2.1 mg/dL (ref 1.6–2.3)

## 2022-08-10 LAB — TSH: TSH: 0.026 u[IU]/mL — ABNORMAL LOW (ref 0.450–4.500)

## 2022-08-10 MED ORDER — METOPROLOL TARTRATE 25 MG PO TABS: 12.5000 mg | ORAL_TABLET | Freq: Two times a day (BID) | ORAL | 3 refills | Status: DC

## 2022-08-10 MED ORDER — APIXABAN 5 MG PO TABS: 5.0000 mg | ORAL_TABLET | Freq: Two times a day (BID) | ORAL | 5 refills | Status: DC

## 2022-08-10 NOTE — Telephone Encounter (Signed)
Patient and wife aware of lab results. She stated patient has an appointment with pcp. I routed labs to pcp per patient request.

## 2022-08-10 NOTE — Telephone Encounter (Signed)
Patient is aware labs were routed to pcp

## 2022-08-10 NOTE — Telephone Encounter (Signed)
  Pt's wife would like to request to send copy of pt's lab work to his pcp Dr. Shelia Media

## 2022-08-10 NOTE — Telephone Encounter (Signed)
-----   Message from Trudi Ida, NP sent at 08/10/2022  6:15 AM EDT ----- Derrick Alexander, We checked your thyroid level yesterday as you had a different patter on your EKG. Your thyroid level is very low, indicating your thyroid is actually overactive. I want you to follow up with Dr. Pennie Banter office today and advise them you saw your cardiologist and you had a change in your EKG and that your TSH level is very low at 0.026. Dr. Shelia Media will likely need to run additional blood work for your thyroid as well.  Other lab work was overall normal. [name], [employer]

## 2022-08-10 NOTE — Telephone Encounter (Signed)
Eliquis 5mg  refill request received. Patient is 77 years old, weight-107.7kg, Crea-0.95 on 08/09/22, Diagnosis-Afib, and last seen by Venia Carbon on 08/09/22. Dose is appropriate based on dosing criteria. Will send in refill to requested pharmacy.

## 2022-08-11 NOTE — Telephone Encounter (Signed)
Pt spouse called stating she spoke to PCP office and they haven't receive labs, asking that it be faxed.    Fax number: [phone]

## 2022-08-11 NOTE — Telephone Encounter (Signed)
Spoke with pt's wife. Informed her the results have been faxed to the PCP via Epic. She verbalized understanding will call PCP office Monday or Tuesday to make sure they received the results.

## 2022-08-16 DIAGNOSIS — E0590 Thyrotoxicosis, unspecified without thyrotoxic crisis or storm: Secondary | ICD-10-CM | POA: Diagnosis not present

## 2022-08-20 DIAGNOSIS — E782 Mixed hyperlipidemia: Secondary | ICD-10-CM | POA: Diagnosis not present

## 2022-08-20 DIAGNOSIS — J449 Chronic obstructive pulmonary disease, unspecified: Secondary | ICD-10-CM | POA: Diagnosis not present

## 2022-08-20 DIAGNOSIS — I10 Essential (primary) hypertension: Secondary | ICD-10-CM | POA: Diagnosis not present

## 2022-08-20 DIAGNOSIS — I480 Paroxysmal atrial fibrillation: Secondary | ICD-10-CM | POA: Diagnosis not present

## 2022-08-24 DIAGNOSIS — R7303 Prediabetes: Secondary | ICD-10-CM | POA: Diagnosis not present

## 2022-08-24 DIAGNOSIS — E0590 Thyrotoxicosis, unspecified without thyrotoxic crisis or storm: Secondary | ICD-10-CM | POA: Diagnosis not present

## 2022-08-24 DIAGNOSIS — I252 Old myocardial infarction: Secondary | ICD-10-CM | POA: Diagnosis not present

## 2022-08-24 DIAGNOSIS — Z8719 Personal history of other diseases of the digestive system: Secondary | ICD-10-CM | POA: Diagnosis not present

## 2022-08-29 DIAGNOSIS — E0590 Thyrotoxicosis, unspecified without thyrotoxic crisis or storm: Secondary | ICD-10-CM | POA: Diagnosis not present

## 2022-08-31 ENCOUNTER — Other Ambulatory Visit (HOSPITAL_COMMUNITY): Payer: Self-pay | Admitting: Nurse Practitioner

## 2022-08-31 DIAGNOSIS — E0590 Thyrotoxicosis, unspecified without thyrotoxic crisis or storm: Secondary | ICD-10-CM

## 2022-09-03 DIAGNOSIS — J410 Simple chronic bronchitis: Secondary | ICD-10-CM | POA: Diagnosis not present

## 2022-09-14 ENCOUNTER — Ambulatory Visit (HOSPITAL_COMMUNITY)
Admission: RE | Admit: 2022-09-14 | Discharge: 2022-09-14 | Disposition: A | Discharge status: Home / Self Care | Payer: Medicare Other | Source: Ambulatory Visit | Attending: Nurse Practitioner | Admitting: Nurse Practitioner

## 2022-09-14 ENCOUNTER — Other Ambulatory Visit: Payer: Self-pay | Admitting: *Deleted

## 2022-09-14 DIAGNOSIS — Z87891 Personal history of nicotine dependence: Secondary | ICD-10-CM

## 2022-09-14 DIAGNOSIS — E0590 Thyrotoxicosis, unspecified without thyrotoxic crisis or storm: Secondary | ICD-10-CM | POA: Insufficient documentation

## 2022-09-14 DIAGNOSIS — Z122 Encounter for screening for malignant neoplasm of respiratory organs: Secondary | ICD-10-CM

## 2022-09-14 MED ORDER — SODIUM IODIDE I-123 7.4 MBQ CAPS
449.0000 | ORAL_CAPSULE | Freq: Once | ORAL | Status: AC
  Administered 2022-09-14: 449 via ORAL

## 2022-09-15 ENCOUNTER — Encounter (HOSPITAL_COMMUNITY)
Admission: RE | Admit: 2022-09-15 | Discharge: 2022-09-15 | Disposition: A | Discharge status: Home / Self Care | Payer: Medicare Other | Source: Ambulatory Visit | Attending: Nurse Practitioner | Admitting: Nurse Practitioner

## 2022-09-17 ENCOUNTER — Other Ambulatory Visit: Payer: Self-pay | Admitting: Cardiology

## 2022-09-18 DIAGNOSIS — E0590 Thyrotoxicosis, unspecified without thyrotoxic crisis or storm: Secondary | ICD-10-CM | POA: Diagnosis not present

## 2022-09-19 ENCOUNTER — Other Ambulatory Visit: Payer: Self-pay | Admitting: Internal Medicine

## 2022-09-19 DIAGNOSIS — E041 Nontoxic single thyroid nodule: Secondary | ICD-10-CM

## 2022-09-22 ENCOUNTER — Encounter: Payer: Self-pay | Admitting: Nurse Practitioner

## 2022-09-22 MED ORDER — FLUTICASONE-SALMETEROL 250-50 MCG/ACT IN AEPB: 1.0000 | INHALATION_SPRAY | Freq: Two times a day (BID) | RESPIRATORY_TRACT | 5 refills | Status: DC

## 2022-09-22 NOTE — Progress Notes (Signed)
09/22/2022 Answering service: refill for Wixela needed. Sent to [employer] on file. Nothing further needed

## 2022-09-25 DIAGNOSIS — H401131 Primary open-angle glaucoma, bilateral, mild stage: Secondary | ICD-10-CM | POA: Diagnosis not present

## 2022-09-25 DIAGNOSIS — H25012 Cortical age-related cataract, left eye: Secondary | ICD-10-CM | POA: Diagnosis not present

## 2022-09-25 DIAGNOSIS — H2512 Age-related nuclear cataract, left eye: Secondary | ICD-10-CM | POA: Diagnosis not present

## 2022-09-25 DIAGNOSIS — Z961 Presence of intraocular lens: Secondary | ICD-10-CM | POA: Diagnosis not present

## 2022-10-03 DIAGNOSIS — J410 Simple chronic bronchitis: Secondary | ICD-10-CM | POA: Diagnosis not present

## 2022-10-04 ENCOUNTER — Ambulatory Visit (HOSPITAL_COMMUNITY)
Admission: RE | Admit: 2022-10-04 | Discharge: 2022-10-04 | Disposition: A | Discharge status: Home / Self Care | Payer: Medicare Other | Source: Ambulatory Visit | Attending: Acute Care | Admitting: Acute Care

## 2022-10-04 ENCOUNTER — Encounter: Payer: Self-pay | Admitting: Acute Care

## 2022-10-04 ENCOUNTER — Ambulatory Visit (INDEPENDENT_AMBULATORY_CARE_PROVIDER_SITE_OTHER): Payer: Medicare Other | Admitting: Acute Care

## 2022-10-04 DIAGNOSIS — Z122 Encounter for screening for malignant neoplasm of respiratory organs: Secondary | ICD-10-CM | POA: Insufficient documentation

## 2022-10-04 DIAGNOSIS — Z87891 Personal history of nicotine dependence: Secondary | ICD-10-CM | POA: Insufficient documentation

## 2022-10-04 NOTE — Patient Instructions (Signed)
Thank you for participating in the [facility]. It was our pleasure to meet you today. We will call you with the results of your scan within the next few days. Your scan will be assigned a Lung RADS category score by the physicians reading the scans.  This Lung RADS score determines follow up scanning.  See below for description of categories, and follow up screening recommendations. We will be in touch to schedule your follow up screening annually or based on recommendations of our providers. We will fax a copy of your scan results to your Primary Care Physician, or the physician who referred you to the program, to ensure they have the results. Please call the office if you have any questions or concerns regarding your scanning experience or results.  Our office number is [phone]. Please speak with Denise Phelps, RN. , or  Denise Buckner RN, They are  our Lung Cancer Screening RN.'s If They are unavailable when you call, Please leave a message on the voice mail. We will return your call at our earliest convenience.This voice mail is monitored several times a day.  Remember, if your scan is normal, we will scan you annually as long as you continue to meet the criteria for the program. (Age 50-80, Current smoker or smoker who has quit within the last 15 years). If you are a smoker, remember, quitting is the single most powerful action that you can take to decrease your risk of lung cancer and other pulmonary, breathing related problems. We know quitting is hard, and we are here to help.  Please let us know if there is anything we can do to help you meet your goal of quitting. If you are a former smoker, congratulations. We are proud of you! Remain smoke free! Remember you can refer friends or family members through the number above.  We will screen them to make sure they meet criteria for the program. Thank you for helping us take better care of you by  participating in Lung Screening.  You can receive free nicotine replacement therapy ( patches, gum or mints) by calling 1-800-QUIT NOW. Please call so we can get you on the path to becoming  a non-smoker. I know it is hard, but you can do this!  Lung RADS Categories:  Lung RADS 1: no nodules or definitely non-concerning nodules.  Recommendation is for a repeat annual scan in 12 months.  Lung RADS 2:  nodules that are non-concerning in appearance and behavior with a very low likelihood of becoming an active cancer. Recommendation is for a repeat annual scan in 12 months.  Lung RADS 3: nodules that are probably non-concerning , includes nodules with a low likelihood of becoming an active cancer.  Recommendation is for a 6-month repeat screening scan. Often noted after an upper respiratory illness. We will be in touch to make sure you have no questions, and to schedule your 6-month scan.  Lung RADS 4 A: nodules with concerning findings, recommendation is most often for a follow up scan in 3 months or additional testing based on our provider's assessment of the scan. We will be in touch to make sure you have no questions and to schedule the recommended 3 month follow up scan.  Lung RADS 4 B:  indicates findings that are concerning. We will be in touch with you to schedule additional diagnostic testing based on our provider's  assessment of the scan.  Other options for assistance in smoking cessation (   As covered by your insurance benefits)  Hypnosis for smoking cessation  [employer] [phone]  Acupuncture for smoking cessation  [facility] [phone]   

## 2022-10-04 NOTE — Progress Notes (Signed)
Virtual Visit via Telephone Note  I connected with Marlise Eves on 10/04/22 at 11:30 AM EDT by telephone and verified that I am speaking with the correct person using two identifiers.  Location: Patient:  At home Provider: [address] [place], [place], [address]    I discussed the limitations, risks, security and privacy concerns of performing an evaluation and management service by telephone and the availability of in person appointments. I also discussed with the patient that there may be a patient responsible charge related to this service. The patient expressed understanding and agreed to proceed.    Shared Decision Making Visit Lung Cancer Screening Program [phone])   Eligibility: Age 64 y.o. Pack Years Smoking History Calculation 75 pack year smoking history (# packs/per year x # years smoked) Recent History of coughing up blood  no Unexplained weight loss? no ( >Than 15 pounds within the last 6 months ) Prior History Lung / other cancer no (Diagnosis within the last 5 years already requiring surveillance chest CT Scans). Smoking Status Former Smoker Former Smokers: Years since quit: 6 years  Quit Date: 2018  Visit Components: Discussion included one or more decision making aids. yes Discussion included risk/benefits of screening. yes Discussion included potential follow up diagnostic testing for abnormal scans. yes Discussion included meaning and risk of over diagnosis. yes Discussion included meaning and risk of False Positives. yes Discussion included meaning of total radiation exposure. yes  Counseling Included: Importance of adherence to annual lung cancer LDCT screening. yes Impact of comorbidities on ability to participate in the program. yes Ability and willingness to under diagnostic treatment. yes  Smoking Cessation Counseling: Current Smokers:  Discussed importance of smoking cessation. yes Information about tobacco cessation classes and  interventions provided to patient. yes Patient provided with "ticket" for LDCT Scan. yes Symptomatic Patient. no  Counseling NA Diagnosis Code: Tobacco Use Z72.0 Asymptomatic Patient yes  Counseling (Intermediate counseling: > three minutes counseling) [id number] Former Smokers:  Discussed the importance of maintaining cigarette abstinence. yes Diagnosis Code: Personal History of Nicotine Dependence. W29.562 Information about tobacco cessation classes and interventions provided to patient. Yes Patient provided with "ticket" for LDCT Scan. yes Written Order for Lung Cancer Screening with LDCT placed in Epic. Yes (CT Chest Lung Cancer Screening Low Dose W/O CM) [id number] Z12.2-Screening of respiratory organs Z87.891-Personal history of nicotine dependence  I spent 25 minutes of face to face time/virtual visit time  with  Mr. Joswiak and his wife,  discussing the risks and benefits of lung cancer screening. We took the time to pause the power point at intervals to allow for questions to be asked and answered to ensure understanding. We discussed that he had taken the single most powerful action possible to decrease his risk of developing lung cancer when he quit smoking. I counseled him to remain smoke free, and to contact me if he ever had the desire to smoke again so that I can provide resources and tools to help support the effort to remain smoke free. We discussed the time and location of the scan, and that either  Abigail Miyamoto RN, Karlton Lemon, RN or I  or I will call / send a letter with the results within  24-72 hours of receiving them. He has the office contact information in the event he needs to speak with me,  he verbalized understanding of all of the above and had no further questions upon leaving the office.     I explained to the patient  that there has been a high incidence of coronary artery disease noted on these exams. I explained that this is a non-gated exam therefore degree or  severity cannot be determined. This patient is on statin therapy. I have asked the patient to follow-up with their PCP regarding any incidental finding of coronary artery disease and management with diet or medication as they feel is clinically indicated. The patient verbalized understanding of the above and had no further questions.    Bevelyn Ngo, NP 10/04/2022

## 2022-10-09 ENCOUNTER — Other Ambulatory Visit: Payer: Self-pay | Admitting: Acute Care

## 2022-10-09 DIAGNOSIS — Z122 Encounter for screening for malignant neoplasm of respiratory organs: Secondary | ICD-10-CM

## 2022-10-09 DIAGNOSIS — Z87891 Personal history of nicotine dependence: Secondary | ICD-10-CM

## 2022-10-11 DIAGNOSIS — E0590 Thyrotoxicosis, unspecified without thyrotoxic crisis or storm: Secondary | ICD-10-CM | POA: Diagnosis not present

## 2022-10-18 ENCOUNTER — Other Ambulatory Visit (HOSPITAL_COMMUNITY)
Admission: RE | Admit: 2022-10-18 | Discharge: 2022-10-18 | Disposition: A | Discharge status: Home / Self Care | Payer: Medicare Other | Source: Ambulatory Visit | Attending: Student | Admitting: Student

## 2022-10-18 ENCOUNTER — Ambulatory Visit
Admission: RE | Admit: 2022-10-18 | Discharge: 2022-10-18 | Disposition: A | Discharge status: Home / Self Care | Payer: Medicare Other | Source: Ambulatory Visit | Attending: Internal Medicine | Admitting: Internal Medicine

## 2022-10-18 DIAGNOSIS — E0590 Thyrotoxicosis, unspecified without thyrotoxic crisis or storm: Secondary | ICD-10-CM | POA: Diagnosis not present

## 2022-10-18 DIAGNOSIS — E041 Nontoxic single thyroid nodule: Secondary | ICD-10-CM

## 2022-10-20 LAB — CYTOLOGY - NON PAP

## 2022-10-27 DIAGNOSIS — G4733 Obstructive sleep apnea (adult) (pediatric): Secondary | ICD-10-CM | POA: Diagnosis not present

## 2022-10-31 DIAGNOSIS — D500 Iron deficiency anemia secondary to blood loss (chronic): Secondary | ICD-10-CM | POA: Diagnosis not present

## 2022-11-01 DIAGNOSIS — D500 Iron deficiency anemia secondary to blood loss (chronic): Secondary | ICD-10-CM | POA: Diagnosis not present

## 2022-11-03 DIAGNOSIS — J410 Simple chronic bronchitis: Secondary | ICD-10-CM | POA: Diagnosis not present

## 2022-11-10 ENCOUNTER — Encounter: Payer: Self-pay | Admitting: Internal Medicine

## 2022-11-10 ENCOUNTER — Ambulatory Visit: Payer: Medicare Other | Attending: Internal Medicine | Admitting: Internal Medicine

## 2022-11-10 VITALS — BP 138/62 | HR 76 | Ht 66.0 in | Wt 234.4 lb

## 2022-11-10 DIAGNOSIS — I4729 Other ventricular tachycardia: Secondary | ICD-10-CM

## 2022-11-10 DIAGNOSIS — I350 Nonrheumatic aortic (valve) stenosis: Secondary | ICD-10-CM | POA: Diagnosis not present

## 2022-11-10 DIAGNOSIS — I480 Paroxysmal atrial fibrillation: Secondary | ICD-10-CM | POA: Diagnosis not present

## 2022-11-10 DIAGNOSIS — Z8719 Personal history of other diseases of the digestive system: Secondary | ICD-10-CM | POA: Diagnosis not present

## 2022-11-10 NOTE — Progress Notes (Signed)
OFFICE NOTE  Chief Complaint:  Follow-up  Primary Care Physician: Merri Brunette, MD  HPI:  Derrick Alexander is a pleasant 77-year-old male with no significant past medical history. He only takes aspirin occasionally as needed. Unfortunately he's a 60 year smoker that currently smoking. Recently he's had 3 different episodes of what seemed to be brief syncope. All of the episodes were at rest and he was seated at the time. His wife said that he was talking with her and then briefly seem to lose consciousness but then was easily awakened and was not confused. He did not slump over lose control of bowel or bladder function. There was no seizure-like activity. He reported some mild prodrome to the symptoms. In fact he felt some tingling and numbness around his scalp that felt like he was wearing a tight baseball cap as well as some graying of his vision. He underwent carotid Dopplers 2 days ago and those results are pending. Of note his blood pressure was mildly elevated today but he has no history of hypertension in fact he was thought to be possibly hypotensive or orthostatic. He denies any chest pain or worsening shortness of breath. His EKG however shows left bundle branch pattern as well as ST and T wave abnormalities laterally with 1 mm of horizontal ST depression at rest concerning for ischemia.  Derrick Alexander returns to follow-up on his monitor. Monitor shows NSVT up 16 beat runs and periods of ventricular escape rhythm in the 30's.  Either could explain syncope. His nuclear stress test was negative for ischemia with normal LV function. Suspect he will likely need AICD/PPM. I contacted Derrick Alexander to recommend an urgent referral but he wished to discuss it further with me in the office first. He understands that these arrhythmias could be life-threatening.  07/23/2018  Derrick Alexander is seen today in follow-up.  I am not seen him in the last couple years when he was initially having ventricular tachycardia and  I referred him to Dr. Elberta Fortis.  He has been managed there and fortunately is had no other significant VT.  He does have a EF and has been appropriately anticoagulated and managed for that.  He is maintaining sinus rhythm.  He has been referred back to me for general cardiology needs.  He does have morbid obesity, recently having significant weight gain after he stopped smoking about a year ago.  He remains on the nicotine patch.  EKG is stable showing bifascicular block but normal sinus rhythm.  Blood pressure is at goal today.  His cholesterol is elevated based on numbers in June 2019.  His total cholesterol is 219, HDL 43, LDL 135 and triglycerides 161.  Hemoglobin A1c of 6.0.  Additionally, he does have a history of snoring and witnessed apnea.  This is persisted and he has significant daytime fatigue and somnolence.  His EPWSS score is 17, highly suggestive of sleep apnea.  He did seem interested in a sleep study.  02/03/2019  Derrick Alexander is seen today in follow-up.  Overall he seems to be doing well.  He denies any recurrent arrhythmias.  EKG today shows sinus rhythm at 64.  He denies any chest pain.  I am not aware that he underwent his sleep study.  He apparently was set up for home sleep study.  There were a couple appointments that were canceled and he says his wife has more knowledge about what actually happened but he did recall having had a home sleep test.  Perhaps this was arranged through his primary care provider.  02/03/2020  Derrick Alexander is seen today for annual follow-up. Which is seen by Dr. Elberta Fortis a week ago. He declines any further NSVT. He was noted to be on diltiazem and flecainide however his wife reports he does not take diltiazem. This was discontinued due to bradycardia. Basically he is only on Xarelto. I think he was also taken off of flecainide due to significant interactions. Is not clear if Dr. Elberta Fortis is aware that he was not on either of these medications. I will mention this to  him.  11/10/2022  Derrick Alexander is seen today in follow-up.  Have not seen him in quite some time.  Earlier this year he had a GI bleed, in fact he had 2 GI bleeds and was switched from Xarelto over to Eliquis.  He has not had any worsening palpitations but did have frequent PVCs.  Repeat echo this spring showed normal LV function however he has a least moderate aortic stenosis.  The gradients could not be reliably measured.  He says he denies any shortness of breath or chest pain.  EKG today shows sinus rhythm with frequent PVCs.  He has had an issue with hyperthyroidism was found to have some nodules and is undergoing workup through his primary care provider.  He is on Tapazole.  Being hyperthyroid and anemic is also contributing probably to increased outflow gradient.  PMHx: Past Medical History:  Diagnosis Date   A-fib [facility])    Abnormal EKG 06/18/2015   Aortic atherosclerosis (HCC)    AV malformation of GI tract    Bradycardia    Carotid artery disease (HCC)    COPD (chronic obstructive pulmonary disease) (HCC)    COPD with exacerbation (HCC)    Diastolic dysfunction    Faintness 06/18/2015   GIB (gastrointestinal bleeding)    Hepatic steatosis    by imaging   Hypertension    LBBB (left bundle branch block) 06/18/2015   Moderate aortic stenosis    NSVT (nonsustained ventricular tachycardia) (HCC)    Obesity    OSA on CPAP    PVC's (premature ventricular contractions)    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 (LAFB)    Syncope 08/06/2015   Tobacco use    V-tach (HCC) 08/06/2015    Past Surgical History:  Procedure Laterality Date   ATRIAL FIBRILLATION ABLATION N/A 09/17/2020   Procedure: ATRIAL FIBRILLATION ABLATION;  Surgeon: Regan Lemming, MD;  Location: [facility] INVASIVE CV LAB;  Service: Cardiovascular;  Laterality: N/A;   BIOPSY  07/10/2022   Procedure: BIOPSY;  Surgeon: Jeani Hawking, MD;  Location: [facility] ENDOSCOPY;  Service: Gastroenterology;;    COLONOSCOPY WITH PROPOFOL N/A 03/07/2019   Procedure: COLONOSCOPY WITH PROPOFOL;  Surgeon: Jeani Hawking, MD;  Location: [facility] ENDOSCOPY;  Service: Endoscopy;  Laterality: N/A;   COLONOSCOPY WITH PROPOFOL N/A 07/10/2022   Procedure: COLONOSCOPY WITH PROPOFOL;  Surgeon: Jeani Hawking, MD;  Location: [facility] ENDOSCOPY;  Service: Gastroenterology;  Laterality: N/A;   ESOPHAGOGASTRODUODENOSCOPY (EGD) WITH PROPOFOL N/A 04/28/2022   Procedure: ESOPHAGOGASTRODUODENOSCOPY (EGD) WITH PROPOFOL;  Surgeon: Jeani Hawking, MD;  Location: [facility] ENDOSCOPY;  Service: Gastroenterology;  Laterality: N/A;   ESOPHAGOGASTRODUODENOSCOPY (EGD) WITH PROPOFOL N/A 07/07/2022   Procedure: ESOPHAGOGASTRODUODENOSCOPY (EGD) WITH PROPOFOL;  Surgeon: Jeani Hawking, MD;  Location: [facility] ENDOSCOPY;  Service: Gastroenterology;  Laterality: N/A;   HOT HEMOSTASIS N/A 04/28/2022   Procedure: HOT HEMOSTASIS (ARGON PLASMA COAGULATION/BICAP);  Surgeon: Jeani Hawking, MD;  Location: [facility] ENDOSCOPY;  Service: Gastroenterology;  Laterality: N/A;   HOT HEMOSTASIS N/A 07/07/2022   Procedure: HOT HEMOSTASIS (ARGON PLASMA COAGULATION/BICAP);  Surgeon: Jeani Hawking, MD;  Location: [facility] ENDOSCOPY;  Service: Gastroenterology;  Laterality: N/A;   POLYPECTOMY  03/07/2019   Procedure: POLYPECTOMY;  Surgeon: Jeani Hawking, MD;  Location: [facility] ENDOSCOPY;  Service: Endoscopy;;   POLYPECTOMY  07/10/2022   Procedure: POLYPECTOMY;  Surgeon: Jeani Hawking, MD;  Location: [facility] ENDOSCOPY;  Service: Gastroenterology;;    FAMHx:  Family History  Problem Relation Age of Onset   Heart disease Brother    COPD Brother    Diabetes Mother     SOCHx:   reports that he quit smoking about 5 years ago. His smoking use included cigarettes. He has a 120.00 pack-year smoking history. He has never used smokeless tobacco. He reports that he does not drink alcohol and does not use drugs.  ALLERGIES:  Allergies  Allergen Reactions   Cardizem [Diltiazem] Shortness Of Breath   Pacerone  [Amiodarone] Shortness Of Breath, Nausea Only and Other (See Comments)    Abdominal pain Dizziness   Tambocor [Flecainide] Shortness Of Breath    ROS: Pertinent items noted in HPI and remainder of comprehensive ROS otherwise negative.  HOME MEDS: Current Outpatient Medications  Medication Sig Dispense Refill   acetaminophen (TYLENOL) 325 MG tablet Take 2 tablets (650 mg total) by mouth every 6 (six) hours as needed for mild pain (or Fever >/= 101).     albuterol (PROVENTIL) (2.5 MG/3ML) 0.083% nebulizer solution Take 3 mLs (2.5 mg total) by nebulization every 4 (four) hours as needed for wheezing or shortness of breath. 75 mL 12   albuterol (VENTOLIN HFA) 108 (90 Base) MCG/ACT inhaler Inhale 2 puffs into the lungs every 6 (six) hours as needed for wheezing or shortness of breath. 8 g 6   apixaban (ELIQUIS) 5 MG TABS tablet Take 1 tablet (5 mg total) by mouth 2 (two) times daily. 60 tablet 5   cetirizine (ZYRTEC) 10 MG tablet Take 10 mg by mouth daily.     fluticasone-salmeterol (WIXELA INHUB) 250-50 MCG/ACT AEPB Inhale 1 puff into the lungs in the morning and at bedtime. 60 each 5   furosemide (LASIX) 40 MG tablet TAKE 1 AND 1/2 TABLETS(60 MG) BY MOUTH TWICE DAILY 270 tablet 1   latanoprost (XALATAN) 0.005 % ophthalmic solution Place 1 drop into both eyes at bedtime.     methimazole (TAPAZOLE) 5 MG tablet Take 5 mg by mouth daily.     metoprolol tartrate (LOPRESSOR) 25 MG tablet Take 0.5 tablets (12.5 mg total) by mouth 2 (two) times daily. 90 tablet 3   Polyethyl Glycol-Propyl Glycol (SYSTANE OP) Place 1 drop into both eyes daily as needed (dry eyes).     rosuvastatin (CRESTOR) 20 MG tablet Take 20 mg by mouth daily.     famotidine (PEPCID) 20 MG tablet Take 20 mg by mouth at bedtime.      Ferrous Sulfate (IRON) 325 (65 Fe) MG TABS Take 1 tablet (325 mg total) by mouth daily. 30 tablet 0   pantoprazole (PROTONIX) 40 MG tablet Take 1 tablet (40 mg total) by mouth 2 (two) times daily before  a meal. 60 tablet 0   No current facility-administered medications for this visit.    LABS/IMAGING: No results found for this or any previous visit (from the past 48 hour(s)). No results found.  WEIGHTS: Wt Readings from Last 3 Encounters:  11/10/22 234 lb 6.4 oz (106.3 kg)  08/09/22 237 lb 6.4  oz (107.7 kg)  08/08/22 239 lb (108.4 kg)    VITALS: BP 138/62 (BP Location: Left Arm, Patient Position: Sitting, Cuff Size: Normal)   Pulse 76   Ht 5\' 6"  (1.676 m)   Wt 234 lb 6.4 oz (106.3 kg)   SpO2 95%   BMI 37.83 kg/m   EXAM: General appearance: alert, no distress, and morbidly obese Neck: no carotid bruit, no JVD, and thyroid not enlarged, symmetric, no tenderness/mass/nodules Lungs: clear to auscultation bilaterally Heart: S1, S2 normal and systolic murmur: late systolic 3/6, musical at 2nd right intercostal space Abdomen: soft, non-tender; bowel sounds normal; no masses,  no organomegaly Extremities: extremities normal, atraumatic, no cyanosis or edema Pulses: 2+ and symmetric Skin: Skin color, texture, turgor normal. No rashes or lesions Neurologic: Grossly normal Psych: Pleasant  EKG: EKG Interpretation  Date/Time:  Friday November 10 2022 13:25:43 EDT Ventricular Rate:  76 PR Interval:  196 QRS Duration: 138 QT Interval:  442 QTC Calculation: 497 R Axis:   -72 Text Interpretation: Sinus rhythm with frequent Premature ventricular complexes Right bundle branch block Left anterior fascicular block Bifascicular block Left ventricular hypertrophy with repolarization abnormality ( R in aVL ) When compared with ECG of 07-Jul-2022 15:14, Previous ECG has undetermined rhythm, needs review ST no longer depressed in Anterior leads Reconfirmed by Zoila Shutter [phone]) on 11/10/2022 1:49:47 PM   ASSESSMENT: NSVT PAF - CHADSVASC score of 4 Bradycardia - ventricular escape in the 30's Syncope x 3 HTN Abnormal EKG with left bundle branch / LAFB pattern Tobacco abuse Had  CPAP Aortic stenosis History of GI bleed x 2 on Xarelto  PLAN: 1.   Derrick Alexander was noted to have recent GI bleeding and switched over to Eliquis.  He denies any palpitations although his still having frequent PVCs.  His burden of PVCs was up to 12% in October 2023.  No evidence of A-fib on his EKG.  He is now on CPAP and reports improved energy.  He has a history of presumed aortic stenosis in the past although gradients were not actually well captured on his echo this past February.  On exam today he has a musical high-pitched late peaking systolic murmur suggestive of at least moderate to severe aortic stenosis.  I would like to do an earlier repeat of his echocardiogram at 6 months which would be August I will contact him with those results and recommendations at that time.   Chrystie Nose, MD, [facility], FACP  [facility]  Medical Director of the [facility] Lipid [facility] &  Cardiovascular Risk Reduction Clinic Diplomate of the American Board of Clinical Lipidology Attending Cardiologist  Direct Dial: [phone]  Fax: [phone]  Website:  [email or web address]   Lisette Abu Aziel Morgan 11/10/2022, 1:50 PM

## 2022-11-10 NOTE — Patient Instructions (Signed)
Medication Instructions:  NO CHANGES  *If you need a refill on your cardiac medications before your next appointment, please call your pharmacy*    Testing/Procedures: Your physician has requested that you have an echocardiogram. Echocardiography is a painless test that uses sound waves to create images of your heart. It provides your doctor with information about the size and shape of your heart and how well your heart's chambers and valves are working. This procedure takes approximately one hour. There are no restrictions for this procedure. Please do NOT wear cologne, perfume, aftershave, or lotions (deodorant is allowed). Please arrive 15 minutes prior to your appointment time. DUE AUGUST 2024   Follow-Up: At [facility], you and your health needs are our priority.  As part of our continuing mission to provide you with exceptional heart care, we have created designated Provider Care Teams.  These Care Teams include your primary Cardiologist (physician) and Advanced Practice Providers (APPs -  Physician Assistants and Nurse Practitioners) who all work together to provide you with the care you need, when you need it.  We recommend signing up for the patient portal called "MyChart".  Sign up information is provided on this After Visit Summary.  MyChart is used to connect with patients for Virtual Visits (Telemedicine).  Patients are able to view lab/test results, encounter notes, upcoming appointments, etc.  Non-urgent messages can be sent to your provider as well.   To learn more about what you can do with MyChart, go to [email or web address].    Your next appointment:    6 months with Dr. Rennis Golden

## 2022-11-29 DIAGNOSIS — E0590 Thyrotoxicosis, unspecified without thyrotoxic crisis or storm: Secondary | ICD-10-CM | POA: Diagnosis not present

## 2022-12-03 DIAGNOSIS — J410 Simple chronic bronchitis: Secondary | ICD-10-CM | POA: Diagnosis not present

## 2022-12-06 DIAGNOSIS — E0590 Thyrotoxicosis, unspecified without thyrotoxic crisis or storm: Secondary | ICD-10-CM | POA: Diagnosis not present

## 2022-12-06 DIAGNOSIS — E041 Nontoxic single thyroid nodule: Secondary | ICD-10-CM | POA: Diagnosis not present

## 2022-12-17 ENCOUNTER — Other Ambulatory Visit: Payer: Self-pay | Admitting: Internal Medicine

## 2023-01-03 DIAGNOSIS — J410 Simple chronic bronchitis: Secondary | ICD-10-CM | POA: Diagnosis not present

## 2023-01-03 DIAGNOSIS — R7303 Prediabetes: Secondary | ICD-10-CM | POA: Diagnosis not present

## 2023-01-03 DIAGNOSIS — I10 Essential (primary) hypertension: Secondary | ICD-10-CM | POA: Diagnosis not present

## 2023-01-03 DIAGNOSIS — E0590 Thyrotoxicosis, unspecified without thyrotoxic crisis or storm: Secondary | ICD-10-CM | POA: Diagnosis not present

## 2023-01-08 ENCOUNTER — Ambulatory Visit (HOSPITAL_COMMUNITY): Payer: Medicare Other | Attending: Internal Medicine

## 2023-01-08 DIAGNOSIS — G4733 Obstructive sleep apnea (adult) (pediatric): Secondary | ICD-10-CM | POA: Diagnosis not present

## 2023-01-08 DIAGNOSIS — R413 Other amnesia: Secondary | ICD-10-CM | POA: Diagnosis not present

## 2023-01-08 DIAGNOSIS — I5032 Chronic diastolic (congestive) heart failure: Secondary | ICD-10-CM | POA: Diagnosis not present

## 2023-01-08 DIAGNOSIS — I350 Nonrheumatic aortic (valve) stenosis: Secondary | ICD-10-CM | POA: Diagnosis not present

## 2023-01-08 DIAGNOSIS — E0590 Thyrotoxicosis, unspecified without thyrotoxic crisis or storm: Secondary | ICD-10-CM | POA: Diagnosis not present

## 2023-01-08 DIAGNOSIS — J449 Chronic obstructive pulmonary disease, unspecified: Secondary | ICD-10-CM | POA: Diagnosis not present

## 2023-01-08 DIAGNOSIS — K219 Gastro-esophageal reflux disease without esophagitis: Secondary | ICD-10-CM | POA: Diagnosis not present

## 2023-01-08 DIAGNOSIS — Z0000 Encounter for general adult medical examination without abnormal findings: Secondary | ICD-10-CM | POA: Diagnosis not present

## 2023-01-08 DIAGNOSIS — D692 Other nonthrombocytopenic purpura: Secondary | ICD-10-CM | POA: Diagnosis not present

## 2023-01-08 DIAGNOSIS — I10 Essential (primary) hypertension: Secondary | ICD-10-CM | POA: Diagnosis not present

## 2023-01-08 DIAGNOSIS — D6869 Other thrombophilia: Secondary | ICD-10-CM | POA: Diagnosis not present

## 2023-01-08 DIAGNOSIS — I6529 Occlusion and stenosis of unspecified carotid artery: Secondary | ICD-10-CM | POA: Diagnosis not present

## 2023-01-08 DIAGNOSIS — I480 Paroxysmal atrial fibrillation: Secondary | ICD-10-CM | POA: Diagnosis not present

## 2023-01-08 LAB — ECHOCARDIOGRAM COMPLETE
AR max vel: 0.85 cm2
AV Area VTI: 0.75 cm2
AV Area mean vel: 0.81 cm2
AV Mean grad: 27.8 mmHg
AV Peak grad: 52.4 mmHg
Ao pk vel: 3.62 m/s
Area-P 1/2: 3.84 cm2
S' Lateral: 4.3 cm

## 2023-01-11 ENCOUNTER — Other Ambulatory Visit (HOSPITAL_COMMUNITY): Payer: Self-pay | Admitting: *Deleted

## 2023-01-11 DIAGNOSIS — I350 Nonrheumatic aortic (valve) stenosis: Secondary | ICD-10-CM

## 2023-01-31 ENCOUNTER — Other Ambulatory Visit: Payer: Self-pay | Admitting: *Deleted

## 2023-01-31 DIAGNOSIS — I4819 Other persistent atrial fibrillation: Secondary | ICD-10-CM

## 2023-01-31 MED ORDER — APIXABAN 5 MG PO TABS: 5.0000 mg | ORAL_TABLET | Freq: Two times a day (BID) | ORAL | 5 refills | Status: DC

## 2023-01-31 NOTE — Telephone Encounter (Signed)
Eliquis 5mg  refill request received. Patient is 77 years old, weight-106.3kg, Crea-0.95 on 08/09/22, Diagnosis-Afib, and last seen by Dr. Rennis Golden on 11/10/22. Dose is appropriate based on dosing criteria. Will send in refill to requested pharmacy.

## 2023-02-01 DIAGNOSIS — E0590 Thyrotoxicosis, unspecified without thyrotoxic crisis or storm: Secondary | ICD-10-CM | POA: Diagnosis not present

## 2023-02-01 DIAGNOSIS — I10 Essential (primary) hypertension: Secondary | ICD-10-CM | POA: Diagnosis not present

## 2023-02-01 DIAGNOSIS — I480 Paroxysmal atrial fibrillation: Secondary | ICD-10-CM | POA: Diagnosis not present

## 2023-02-01 DIAGNOSIS — E782 Mixed hyperlipidemia: Secondary | ICD-10-CM | POA: Diagnosis not present

## 2023-02-01 DIAGNOSIS — I5032 Chronic diastolic (congestive) heart failure: Secondary | ICD-10-CM | POA: Diagnosis not present

## 2023-02-01 DIAGNOSIS — R7303 Prediabetes: Secondary | ICD-10-CM | POA: Diagnosis not present

## 2023-02-01 DIAGNOSIS — I252 Old myocardial infarction: Secondary | ICD-10-CM | POA: Diagnosis not present

## 2023-02-03 DIAGNOSIS — J410 Simple chronic bronchitis: Secondary | ICD-10-CM | POA: Diagnosis not present

## 2023-03-20 DIAGNOSIS — D1801 Hemangioma of skin and subcutaneous tissue: Secondary | ICD-10-CM | POA: Diagnosis not present

## 2023-03-20 DIAGNOSIS — L821 Other seborrheic keratosis: Secondary | ICD-10-CM | POA: Diagnosis not present

## 2023-03-20 DIAGNOSIS — L820 Inflamed seborrheic keratosis: Secondary | ICD-10-CM | POA: Diagnosis not present

## 2023-03-30 DIAGNOSIS — H401131 Primary open-angle glaucoma, bilateral, mild stage: Secondary | ICD-10-CM | POA: Diagnosis not present

## 2023-04-09 DIAGNOSIS — E0590 Thyrotoxicosis, unspecified without thyrotoxic crisis or storm: Secondary | ICD-10-CM | POA: Diagnosis not present

## 2023-04-16 ENCOUNTER — Other Ambulatory Visit: Payer: Self-pay | Admitting: Nurse Practitioner

## 2023-04-16 DIAGNOSIS — E042 Nontoxic multinodular goiter: Secondary | ICD-10-CM

## 2023-04-16 DIAGNOSIS — E0590 Thyrotoxicosis, unspecified without thyrotoxic crisis or storm: Secondary | ICD-10-CM | POA: Diagnosis not present

## 2023-04-17 ENCOUNTER — Ambulatory Visit
Admission: RE | Admit: 2023-04-17 | Discharge: 2023-04-17 | Disposition: A | Discharge status: Home / Self Care | Payer: Medicare Other | Source: Ambulatory Visit | Attending: Nurse Practitioner | Admitting: Nurse Practitioner

## 2023-04-17 DIAGNOSIS — E042 Nontoxic multinodular goiter: Secondary | ICD-10-CM

## 2023-05-07 ENCOUNTER — Ambulatory Visit: Payer: Medicare Other | Attending: Internal Medicine | Admitting: Internal Medicine

## 2023-05-07 VITALS — BP 104/62 | HR 45 | Ht 65.0 in | Wt 232.0 lb

## 2023-05-07 DIAGNOSIS — I350 Nonrheumatic aortic (valve) stenosis: Secondary | ICD-10-CM | POA: Diagnosis not present

## 2023-05-07 DIAGNOSIS — I480 Paroxysmal atrial fibrillation: Secondary | ICD-10-CM | POA: Diagnosis not present

## 2023-05-07 DIAGNOSIS — I4729 Other ventricular tachycardia: Secondary | ICD-10-CM | POA: Diagnosis not present

## 2023-05-07 DIAGNOSIS — D6869 Other thrombophilia: Secondary | ICD-10-CM

## 2023-05-07 DIAGNOSIS — I447 Left bundle-branch block, unspecified: Secondary | ICD-10-CM | POA: Diagnosis not present

## 2023-05-07 DIAGNOSIS — I493 Ventricular premature depolarization: Secondary | ICD-10-CM

## 2023-05-07 NOTE — Progress Notes (Signed)
OFFICE NOTE  Chief Complaint:  Follow-up  Primary Care Physician: Merri Brunette, MD  HPI:  Derrick Alexander is a pleasant 77-year-old male with no significant past medical history. He only takes aspirin occasionally as needed. Unfortunately he's a 60 year smoker that currently smoking. Recently he's had 3 different episodes of what seemed to be brief syncope. All of the episodes were at rest and he was seated at the time. His wife said that he was talking with her and then briefly seem to lose consciousness but then was easily awakened and was not confused. He did not slump over lose control of bowel or bladder function. There was no seizure-like activity. He reported some mild prodrome to the symptoms. In fact he felt some tingling and numbness around his scalp that felt like he was wearing a tight baseball cap as well as some graying of his vision. He underwent carotid Dopplers 2 days ago and those results are pending. Of note his blood pressure was mildly elevated today but he has no history of hypertension in fact he was thought to be possibly hypotensive or orthostatic. He denies any chest pain or worsening shortness of breath. His EKG however shows left bundle branch pattern as well as ST and T wave abnormalities laterally with 1 mm of horizontal ST depression at rest concerning for ischemia.  Derrick Alexander returns to follow-up on his monitor. Monitor shows NSVT up 16 beat runs and periods of ventricular escape rhythm in the 30's.  Either could explain syncope. His nuclear stress test was negative for ischemia with normal LV function. Suspect he will likely need AICD/PPM. I contacted Derrick Alexander to recommend an urgent referral but he wished to discuss it further with me in the office first. He understands that these arrhythmias could be life-threatening.  07/23/2018  Derrick Alexander is seen today in follow-up.  I am not seen him in the last couple years when he was initially having ventricular tachycardia and  I referred him to Dr. Elberta Fortis.  He has been managed there and fortunately is had no other significant VT.  He does have a EF and has been appropriately anticoagulated and managed for that.  He is maintaining sinus rhythm.  He has been referred back to me for general cardiology needs.  He does have morbid obesity, recently having significant weight gain after he stopped smoking about a year ago.  He remains on the nicotine patch.  EKG is stable showing bifascicular block but normal sinus rhythm.  Blood pressure is at goal today.  His cholesterol is elevated based on numbers in June 2019.  His total cholesterol is 219, HDL 43, LDL 135 and triglycerides 638.  Hemoglobin A1c of 6.0.  Additionally, he does have a history of snoring and witnessed apnea.  This is persisted and he has significant daytime fatigue and somnolence.  His EPWSS score is 17, highly suggestive of sleep apnea.  He did seem interested in a sleep study.  02/03/2019  Derrick Alexander is seen today in follow-up.  Overall he seems to be doing well.  He denies any recurrent arrhythmias.  EKG today shows sinus rhythm at 64.  He denies any chest pain.  I am not aware that he underwent his sleep study.  He apparently was set up for home sleep study.  There were a couple appointments that were canceled and he says his wife has more knowledge about what actually happened but he did recall having had a home sleep test.  Perhaps this was arranged through his primary care provider.  02/03/2020  Derrick Alexander is seen today for annual follow-up. Which is seen by Dr. Elberta Fortis a week ago. He declines any further NSVT. He was noted to be on diltiazem and flecainide however his wife reports he does not take diltiazem. This was discontinued due to bradycardia. Basically he is only on Xarelto. I think he was also taken off of flecainide due to significant interactions. Is not clear if Dr. Elberta Fortis is aware that he was not on either of these medications. I will mention this to  him.  11/10/2022  Derrick Alexander is seen today in follow-up.  Have not seen him in quite some time.  Earlier this year he had a GI bleed, in fact he had 2 GI bleeds and was switched from Xarelto over to Eliquis.  He has not had any worsening palpitations but did have frequent PVCs.  Repeat echo this spring showed normal LV function however he has a least moderate aortic stenosis.  The gradients could not be reliably measured.  He says he denies any shortness of breath or chest pain.  EKG today shows sinus rhythm with frequent PVCs.  He has had an issue with hyperthyroidism was found to have some nodules and is undergoing workup through his primary care provider.  He is on Tapazole.  Being hyperthyroid and anemic is also contributing probably to increased outflow gradient.  05/07/2023  Derrick Alexander returns today for follow-up of aortic stenosis.  He had an echo in August which showed probable severe aortic stenosis with a mean gradient of 36 mmHg and aortic valve area of 0.5 cm.  The DI was 0.21.  He apparently was asymptomatic.  I asked him again today whether or not he is having any symptoms.  Plan was to repeat his echo around this time to see if he had any further progression.  He has had paroxysmal atrial fibrillation.  While he was in a sinus rhythm previously he is now in A-fib today.  He is also having PVCs which were noted before.  He is anticoagulated on Eliquis.  He does have a history of GI bleeding but has not had any recently.  He does report some worsening fatigue.  He denies any chest pain.  PMHx: Past Medical History:  Diagnosis Date   A-fib [facility])    Abnormal EKG 06/18/2015   Aortic atherosclerosis (HCC)    AV malformation of GI tract    Bradycardia    Carotid artery disease (HCC)    COPD (chronic obstructive pulmonary disease) (HCC)    COPD with exacerbation (HCC)    Diastolic dysfunction    Faintness 06/18/2015   GIB (gastrointestinal bleeding)    Hepatic steatosis    by imaging    Hypertension    LBBB (left bundle branch block) 06/18/2015   Moderate aortic stenosis    NSVT (nonsustained ventricular tachycardia) (HCC)    Obesity    OSA on CPAP    PVC's (premature ventricular contractions)    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 (LAFB)    Syncope 08/06/2015   Tobacco use    V-tach (HCC) 08/06/2015    Past Surgical History:  Procedure Laterality Date   ATRIAL FIBRILLATION ABLATION N/A 09/17/2020   Procedure: ATRIAL FIBRILLATION ABLATION;  Surgeon: Regan Lemming, MD;  Location: [facility] INVASIVE CV LAB;  Service: Cardiovascular;  Laterality: N/A;   BIOPSY  07/10/2022   Procedure: BIOPSY;  Surgeon: Jeani Hawking,  MD;  Location: [facility] ENDOSCOPY;  Service: Gastroenterology;;   COLONOSCOPY WITH PROPOFOL N/A 03/07/2019   Procedure: COLONOSCOPY WITH PROPOFOL;  Surgeon: Jeani Hawking, MD;  Location: [facility] ENDOSCOPY;  Service: Endoscopy;  Laterality: N/A;   COLONOSCOPY WITH PROPOFOL N/A 07/10/2022   Procedure: COLONOSCOPY WITH PROPOFOL;  Surgeon: Jeani Hawking, MD;  Location: [facility] ENDOSCOPY;  Service: Gastroenterology;  Laterality: N/A;   ESOPHAGOGASTRODUODENOSCOPY (EGD) WITH PROPOFOL N/A 04/28/2022   Procedure: ESOPHAGOGASTRODUODENOSCOPY (EGD) WITH PROPOFOL;  Surgeon: Jeani Hawking, MD;  Location: [facility] ENDOSCOPY;  Service: Gastroenterology;  Laterality: N/A;   ESOPHAGOGASTRODUODENOSCOPY (EGD) WITH PROPOFOL N/A 07/07/2022   Procedure: ESOPHAGOGASTRODUODENOSCOPY (EGD) WITH PROPOFOL;  Surgeon: Jeani Hawking, MD;  Location: [facility] ENDOSCOPY;  Service: Gastroenterology;  Laterality: N/A;   HOT HEMOSTASIS N/A 04/28/2022   Procedure: HOT HEMOSTASIS (ARGON PLASMA COAGULATION/BICAP);  Surgeon: Jeani Hawking, MD;  Location: [facility] ENDOSCOPY;  Service: Gastroenterology;  Laterality: N/A;   HOT HEMOSTASIS N/A 07/07/2022   Procedure: HOT HEMOSTASIS (ARGON PLASMA COAGULATION/BICAP);  Surgeon: Jeani Hawking, MD;  Location: [facility] ENDOSCOPY;  Service: Gastroenterology;  Laterality:  N/A;   POLYPECTOMY  03/07/2019   Procedure: POLYPECTOMY;  Surgeon: Jeani Hawking, MD;  Location: [facility] ENDOSCOPY;  Service: Endoscopy;;   POLYPECTOMY  07/10/2022   Procedure: POLYPECTOMY;  Surgeon: Jeani Hawking, MD;  Location: [facility] ENDOSCOPY;  Service: Gastroenterology;;    FAMHx:  Family History  Problem Relation Age of Onset   Heart disease Brother    COPD Brother    Diabetes Mother     SOCHx:   reports that he quit smoking about 5 years ago. His smoking use included cigarettes. He started smoking about 65 years ago. He has a 120 pack-year smoking history. He has never used smokeless tobacco. He reports that he does not drink alcohol and does not use drugs.  ALLERGIES:  Allergies  Allergen Reactions   Cardizem [Diltiazem] Shortness Of Breath   Pacerone [Amiodarone] Shortness Of Breath, Nausea Only and Other (See Comments)    Abdominal pain Dizziness   Tambocor [Flecainide] Shortness Of Breath    ROS: Pertinent items noted in HPI and remainder of comprehensive ROS otherwise negative.  HOME MEDS: Current Outpatient Medications  Medication Sig Dispense Refill   acetaminophen (TYLENOL) 325 MG tablet Take 2 tablets (650 mg total) by mouth every 6 (six) hours as needed for mild pain (or Fever >/= 101).     albuterol (PROVENTIL) (2.5 MG/3ML) 0.083% nebulizer solution Take 3 mLs (2.5 mg total) by nebulization every 4 (four) hours as needed for wheezing or shortness of breath. 75 mL 12   albuterol (VENTOLIN HFA) 108 (90 Base) MCG/ACT inhaler Inhale 2 puffs into the lungs every 6 (six) hours as needed for wheezing or shortness of breath. 8 g 6   apixaban (ELIQUIS) 5 MG TABS tablet Take 1 tablet (5 mg total) by mouth 2 (two) times daily. 60 tablet 5   cetirizine (ZYRTEC) 10 MG tablet Take 10 mg by mouth daily.     famotidine (PEPCID) 20 MG tablet Take 20 mg by mouth at bedtime.      fluticasone-salmeterol (WIXELA INHUB) 250-50 MCG/ACT AEPB Inhale 1 puff into the lungs in the morning and at  bedtime. 60 each 5   furosemide (LASIX) 40 MG tablet TAKE 1 AND 1/2 TABLETS(60 MG) BY MOUTH TWICE DAILY 270 tablet 1   latanoprost (XALATAN) 0.005 % ophthalmic solution Place 1 drop into both eyes at bedtime.     methimazole (TAPAZOLE) 5 MG tablet Take 5 mg by mouth daily.  metoprolol tartrate (LOPRESSOR) 25 MG tablet Take 0.5 tablets (12.5 mg total) by mouth 2 (two) times daily. 90 tablet 3   Polyethyl Glycol-Propyl Glycol (SYSTANE OP) Place 1 drop into both eyes daily as needed (dry eyes).     rosuvastatin (CRESTOR) 20 MG tablet Take 20 mg by mouth daily.     Ferrous Sulfate (IRON) 325 (65 Fe) MG TABS Take 1 tablet (325 mg total) by mouth daily. (Patient not taking: Reported on 05/07/2023) 30 tablet 0   pantoprazole (PROTONIX) 40 MG tablet Take 1 tablet (40 mg total) by mouth 2 (two) times daily before a meal. 60 tablet 0   No current facility-administered medications for this visit.    LABS/IMAGING: No results found for this or any previous visit (from the past 48 hours). No results found.  WEIGHTS: Wt Readings from Last 3 Encounters:  05/07/23 232 lb (105.2 kg)  11/10/22 234 lb 6.4 oz (106.3 kg)  08/09/22 237 lb 6.4 oz (107.7 kg)    VITALS: BP 104/62 (BP Location: Right Arm, Patient Position: Sitting, Cuff Size: Large)   Pulse (!) 45   Ht 5\' 5"  (1.651 m)   Wt 232 lb (105.2 kg)   SpO2 96%   BMI 38.61 kg/m   EXAM: General appearance: alert, no distress, and morbidly obese Neck: no carotid bruit, no JVD, and thyroid not enlarged, symmetric, no tenderness/mass/nodules Lungs: wheezes bilaterally Heart: S1, S2 normal and systolic murmur: systolic ejection 3/6, musical at 2nd right intercostal space Abdomen: soft, non-tender; bowel sounds normal; no masses,  no organomegaly Extremities: extremities normal, atraumatic, no cyanosis or edema Pulses: 2+ and symmetric Skin: Skin color, texture, turgor normal. No rashes or lesions Neurologic: Grossly normal Psych:  Pleasant  EKG: EKG Interpretation Date/Time:  Monday May 07 2023 08:42:21 EST Ventricular Rate:  78 PR Interval:    QRS Duration:  134 QT Interval:  424 QTC Calculation: 483 R Axis:   -74  Text Interpretation: Atrial fibrillation with premature ventricular or aberrantly conducted complexes Left axis deviation Right bundle branch block Minimal voltage criteria for LVH, may be normal variant ( R in aVL ) When compared with ECG of 10-Nov-2022 13:25, Atrial fibrillation has replaced Sinus rhythm Confirmed by Zoila Shutter [phone]) on 05/07/2023 8:50:18 AM    ASSESSMENT: NSVT PAF - CHADSVASC score of 4 (history of prior ablation) Bradycardia - ventricular escape in the 30's Syncope x 3 HTN Abnormal EKG with left bundle branch / LAFB pattern Tobacco abuse Had CPAP Aortic stenosis History of GI bleed x 2 on Xarelto  PLAN: 1.   Derrick Alexander has severe aortic stenosis by exam and by echo in August had a significant increase in his gradient.  He was reportedly asymptomatic at the time however now may be more fatigued or short of breath.  He is also in A-fib today.  This has been paroxysmal.  He had prior ablation.  I would like to repeat his echo as I feel that his aortic stenosis may have progressed somewhat.  He will likely need referral to structural heart clinic.  I think the likelihood of him achieving a sinus rhythm is low until we can deal with his aortic stenosis.  Plan follow-up with me ultimately in 6 months or sooner as necessary.   Chrystie Nose, MD, [facility], FACP  [place]  [facility]  Medical Director of the Advanced Lipid Disorders &  Cardiovascular Risk Reduction Clinic Diplomate of the American Board of Clinical Lipidology Attending Cardiologist  Direct Dial:  [phone]  Fax: [phone]  Website:  [email or web address] 05/07/2023, 8:50 AM

## 2023-05-07 NOTE — Patient Instructions (Signed)
Medication Instructions:  NO CHANGES  *If you need a refill on your cardiac medications before your next appointment, please call your pharmacy*   Testing/Procedures: Please schedule ECHO as soon as able   Follow-Up: At [facility], you and your health needs are our priority.  As part of our continuing mission to provide you with exceptional heart care, we have created designated Provider Care Teams.  These Care Teams include your primary Cardiologist (physician) and Advanced Practice Providers (APPs -  Physician Assistants and Nurse Practitioners) who all work together to provide you with the care you need, when you need it.  We recommend signing up for the patient portal called "MyChart".  Sign up information is provided on this After Visit Summary.  MyChart is used to connect with patients for Virtual Visits (Telemedicine).  Patients are able to view lab/test results, encounter notes, upcoming appointments, etc.  Non-urgent messages can be sent to your provider as well.   To learn more about what you can do with MyChart, go to [email or web address].    Your next appointment:    6 months with Dr. Rennis Golden

## 2023-05-10 DIAGNOSIS — I480 Paroxysmal atrial fibrillation: Secondary | ICD-10-CM | POA: Diagnosis not present

## 2023-05-10 DIAGNOSIS — I10 Essential (primary) hypertension: Secondary | ICD-10-CM | POA: Diagnosis not present

## 2023-05-10 DIAGNOSIS — I252 Old myocardial infarction: Secondary | ICD-10-CM | POA: Diagnosis not present

## 2023-05-11 ENCOUNTER — Ambulatory Visit (HOSPITAL_BASED_OUTPATIENT_CLINIC_OR_DEPARTMENT_OTHER): Payer: Medicare Other | Admitting: Pulmonary Disease

## 2023-05-11 ENCOUNTER — Encounter (HOSPITAL_BASED_OUTPATIENT_CLINIC_OR_DEPARTMENT_OTHER): Payer: Self-pay | Admitting: Pulmonary Disease

## 2023-05-11 VITALS — BP 128/78 | HR 49 | Resp 18 | Ht 65.0 in | Wt 238.7 lb

## 2023-05-11 DIAGNOSIS — J449 Chronic obstructive pulmonary disease, unspecified: Secondary | ICD-10-CM

## 2023-05-11 DIAGNOSIS — G4733 Obstructive sleep apnea (adult) (pediatric): Secondary | ICD-10-CM

## 2023-05-11 MED ORDER — FLUTICASONE-SALMETEROL 250-50 MCG/ACT IN AEPB: 1.0000 | INHALATION_SPRAY | Freq: Two times a day (BID) | RESPIRATORY_TRACT | 11 refills | Status: AC

## 2023-05-11 NOTE — Patient Instructions (Signed)
  COPD --CONTINUE Wixela 250-50 mcg ONE puff in the morning and in the evening. Rinse mouth out after use --CONTINUE Albuterol AS NEEDED for shortness of breath or wheezing. OK to use prior to exercise --Encourage regular aerobic exercise up to 20 minutes daily   OSA - managed by PCP Self-reports compliance with CPAP --Counseled on sleep hygiene --Counseled on weight loss/maintenance of healthy weight --Counseled NOT to drive if/when sleepy --Advised patient to wear CPAP for at least 4 hours each night for greater than 70% of the time to avoid the machine being repossessed by insurance.

## 2023-05-11 NOTE — Progress Notes (Signed)
Subjective:   PATIENT ID: Derrick Alexander GENDER: male DOB: 1946-04-04, MRN: [id number]   HPI  Chief Complaint  Patient presents with   Follow-up    COPD- breathing is ok, off and on-in a fib at this time that makes it hard to breathe    Reason for Visit: Follow-up for shortness of breath  [name] is a 77 year old male former smoker with COPD, OSA, atrial fibrillation, history of VT, hypertension who presents for follow-up for COPD  Initial consult He was previously seen by Dr. Eulah Alexander pulmonary on 08/01/2017.  He has been lost to follow-up since then. He reports worsening shortness of breath in the last six months. He reports difficulty walking and carrying things without feeling breathless. Has to take breaks when walking down the driveway. Walks slowly up the stairs. Shortness of breath is associated with wheezing with exertion. Minimal cough. Does wheeze at night. Wears CPAP on average 5 hours at night.  He is currently not on inhalers. He reports weight gain 20 lb in the last 6 months. Not active at baseline.  11/15/2021 Since our last visit he has been compliant with Spiriva. However not sure if he feels a difference. Sometimes standing for long periods of time including in the shower, he has difficulty and will need rest. He does walk a few minutes to the shed. Denies wheezing or coughing.  02/14/22 Since our last visit, he has been off Stiolto for two weeks. He is not planning to buy any meds due to cost. He is hoping that he doesn't need to go to the hospital by the end of the year. When he was on it he had improved shortness of breath and improved wheezing. Now his wife reports he is noticeably more short of breath and not able to walk as far. He is compliant with his CPAP. He does not know if his quality of sleep is improved but has noticed that he is not requiring as many naps during the day and no longer falling asleep while talking.   05/09/22 Since our last visit  he was recently hospitalized last week for GI bleed secondary to multiple AVMs treated with APC. Since then his cough has worsened. Was not treated for COPD exacerbation while inpatient and had no O2 needs. No fevers or chills. Swallows ok. Associated with some wheezing and shortness of breath that worsens at night. Not active at baseline but has not been for the majority of this year. Wife present and agrees that has had minimal activity.  08/08/22 Since our last visit he has been compliant with Advair and no further leg tremors. Denies cough or wheezing. Shortness of breath with exertion due to atrial fibrillation and lack of activity since being discharged from hospital. He was hospitalized from 07/06/22-07/12/22 for GI bleeding with erythematous gastric mucosa and colonoscopy with multiple ulcers in ascending colon and cecum. He is able to walk to mailbox now. Compliant with CPAP nightly.  05/11/23 Since our last visit he has been compliant with Wixela. Rarely uses albuterol. Breathing has been labored but has had issues with afib recently. Denies exacerbations since our last visit. Denies cough or wheezing. Compliant with CPAP nightly. Has recently switched from [employer] to [facility]. Planning to start [facility] next year.  Social History: Former smoker. Quit in 2017. >99 pack years. Previously 3 packs daily Previously broke his back as a teenager on trampoline  Past Medical History:  Diagnosis Date   A-fib (HCC)  Abnormal EKG 06/18/2015   Aortic atherosclerosis (HCC)    AV malformation of GI tract    Bradycardia    Carotid artery disease (HCC)    COPD (chronic obstructive pulmonary disease) (HCC)    COPD with exacerbation (HCC)    Diastolic dysfunction    Faintness 06/18/2015   GIB (gastrointestinal bleeding)    Hepatic steatosis    by imaging   Hypertension    LBBB (left bundle branch block) 06/18/2015   Moderate aortic stenosis    NSVT (nonsustained ventricular tachycardia) (HCC)     Obesity    OSA on CPAP    PVC's (premature ventricular contractions)    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 (LAFB)    Syncope 08/06/2015   Tobacco use    V-tach (HCC) 08/06/2015     Family History  Problem Relation Age of Onset   Heart disease Brother    COPD Brother    Diabetes Mother      Social History   Occupational History   Occupation: retired  Tobacco Use   Smoking status: Former    Current packs/day: 0.00    Average packs/day: 2.0 packs/day for 60.0 years (120.0 ttl pk-yrs)    Types: Cigarettes    Start date: 06/13/1957    Quit date: 06/13/2017    Years since quitting: 5.9   Smokeless tobacco: Never  Vaping Use   Vaping status: Never Used  Substance and Sexual Activity   Alcohol use: No    Alcohol/week: 0.0 standard drinks of alcohol   Drug use: No   Sexual activity: Not on file    Allergies  Allergen Reactions   Cardizem [Diltiazem] Shortness Of Breath   Pacerone [Amiodarone] Shortness Of Breath, Nausea Only and Other (See Comments)    Abdominal pain Dizziness   Tambocor [Flecainide] Shortness Of Breath     Outpatient Medications Prior to Visit  Medication Sig Dispense Refill   acetaminophen (TYLENOL) 325 MG tablet Take 2 tablets (650 mg total) by mouth every 6 (six) hours as needed for mild pain (or Fever >/= 101).     albuterol (PROVENTIL) (2.5 MG/3ML) 0.083% nebulizer solution Take 3 mLs (2.5 mg total) by nebulization every 4 (four) hours as needed for wheezing or shortness of breath. 75 mL 12   albuterol (VENTOLIN HFA) 108 (90 Base) MCG/ACT inhaler Inhale 2 puffs into the lungs every 6 (six) hours as needed for wheezing or shortness of breath. 8 g 6   apixaban (ELIQUIS) 5 MG TABS tablet Take 1 tablet (5 mg total) by mouth 2 (two) times daily. 60 tablet 5   cetirizine (ZYRTEC) 10 MG tablet Take 10 mg by mouth daily.     furosemide (LASIX) 40 MG tablet TAKE 1 AND 1/2 TABLETS(60 MG) BY MOUTH TWICE DAILY 270 tablet 1    latanoprost (XALATAN) 0.005 % ophthalmic solution Place 1 drop into both eyes at bedtime.     methimazole (TAPAZOLE) 5 MG tablet Take 5 mg by mouth daily.     metoprolol tartrate (LOPRESSOR) 25 MG tablet Take 0.5 tablets (12.5 mg total) by mouth 2 (two) times daily. 90 tablet 3   Polyethyl Glycol-Propyl Glycol (SYSTANE OP) Place 1 drop into both eyes daily as needed (dry eyes).     rosuvastatin (CRESTOR) 20 MG tablet Take 20 mg by mouth daily.     famotidine (PEPCID) 20 MG tablet Take 20 mg by mouth at bedtime.      Ferrous Sulfate (IRON) 325 (65 Fe)  MG TABS Take 1 tablet (325 mg total) by mouth daily. 30 tablet 0   fluticasone-salmeterol (WIXELA INHUB) 250-50 MCG/ACT AEPB Inhale 1 puff into the lungs in the morning and at bedtime. 60 each 5   pantoprazole (PROTONIX) 40 MG tablet Take 1 tablet (40 mg total) by mouth 2 (two) times daily before a meal. 60 tablet 0   No facility-administered medications prior to visit.    Review of Systems  Constitutional:  Negative for chills, diaphoresis, fever, malaise/fatigue and weight loss.  HENT:  Negative for congestion.   Respiratory:  Positive for shortness of breath. Negative for cough, hemoptysis, sputum production and wheezing.   Cardiovascular:  Negative for chest pain, palpitations and leg swelling.     Objective:   Vitals:   05/11/23 0936  BP: 128/78  Pulse: (!) 49  Resp: 18  SpO2: 97%  Weight: 238 lb 11.2 oz (108.3 kg)  Height: 5\' 5"  (1.651 m)   SpO2: 97 %  Physical Exam: General: Well-appearing, no acute distress HENT: [place], AT Eyes: EOMI, no scleral icterus Respiratory: Clear to auscultation bilaterally.  No crackles, wheezing or rales Cardiovascular: RRR, -M/R/G, no JVD Extremities:-Edema,-tenderness Neuro: AAO x4, CNII-XII grossly intact Psych: Normal mood, normal affect  Data Reviewed:  Imaging: CXR 06/19/2017 mild cardiomegaly.  No infiltrate, effusion or edema. CT cardiac 09/10/2020-CAC score 361 which is 50th  percentile.  Visualized lung parenchyma normal, no evidence pulmonary nodules/masses. CXR 04/29/22 - No acute issues CXR 07/06/22 - Normal CT lung screen 10/04/22 - Mild centrilobular emphysema. Subcentimeter nodules  PFT: 08/01/2017 FVC 2.38 (74%) FEV1 1.73 (75%) ratio 71 TLC 103% RV 150% RV/TLC 147% DLCO 101% Interpretation mild obstructive lung disease with air trapping present normal gas exchange.  No significant bronchodilator response however does not preclude benefit of therapy.  Labs: CBC    Component Value Date/Time   WBC 10.2 07/12/2022 0448   RBC 2.78 (L) 07/12/2022 0448   HGB 7.9 (L) 07/12/2022 0448   HGB 7.8 (L) 05/05/2022 0956   HCT 25.0 (L) 07/12/2022 0448   HCT 23.7 (L) 05/05/2022 0956   PLT 217 07/12/2022 0448   PLT 212 05/05/2022 0956   MCV 89.9 07/12/2022 0448   MCV 88 05/05/2022 0956   MCH 28.4 07/12/2022 0448   MCHC 31.6 07/12/2022 0448   RDW 15.8 (H) 07/12/2022 0448   RDW 14.9 05/05/2022 0956   LYMPHSABS 1.3 07/08/2022 0439   MONOABS 1.5 (H) 07/08/2022 0439   EOSABS 0.0 07/08/2022 0439   BASOSABS 0.1 07/08/2022 0439   Absolute eos  08/01/2017-100 04/27/22 -100    Assessment & Plan:   Discussion: 77 year old male with COPD, atrial fibrillation, hx VT, HTN who presents for follow-up. Overall well controlled. Discussed clinical course and management of COPD including bronchodilator regimen, preventive care including vaccinations and action plan for exacerbation.  Prior inhalers: Stiolto - Dc'd due to cost  COPD --CONTINUE Wixela 250-50 mcg ONE puff in the morning and in the evening. Rinse mouth out after use --CONTINUE Albuterol AS NEEDED for shortness of breath or wheezing. OK to use prior to exercise --Encourage regular aerobic exercise up to 20 minutes daily   OSA - managed by PCP Self-reports compliance with CPAP --Counseled on sleep hygiene --Counseled on weight loss/maintenance of healthy weight --Counseled NOT to drive if/when  sleepy --Advised patient to wear CPAP for at least 4 hours each night for greater than 70% of the time to avoid the machine being repossessed by insurance.   Health Maintenance  Immunization History  Administered Date(s) Administered   Influenza, High Dose Seasonal PF 06/14/2017   Influenza, Quadrivalent, Recombinant, Inj, Pf 06/14/2017, 01/29/2019   Pneumococcal Conjugate-13 12/12/2018   Pneumococcal Polysaccharide-23 06/14/2017   Tdap 11/06/2017   CT Lung Screen - not qualified. Due to age  No orders of the defined types were placed in this encounter.  Meds ordered this encounter  Medications   fluticasone-salmeterol (WIXELA INHUB) 250-50 MCG/ACT AEPB    Sig: Inhale 1 puff into the lungs in the morning and at bedtime.    Dispense:  60 each    Refill:  11    Return in about 9 months (around 02/09/2024).  I have spent a total time of 30-minutes on the day of the appointment including chart review, data review, collecting history, coordinating care and discussing medical diagnosis and plan with the patient/family. Past medical history, allergies, medications were reviewed. Pertinent imaging, labs and tests included in this note have been reviewed and interpreted independently by me.  Aldora Perman Mechele Collin, MD [facility] Office Number [phone]

## 2023-05-22 ENCOUNTER — Ambulatory Visit (HOSPITAL_COMMUNITY): Payer: Medicare Other | Attending: Internal Medicine

## 2023-05-22 DIAGNOSIS — I350 Nonrheumatic aortic (valve) stenosis: Secondary | ICD-10-CM | POA: Insufficient documentation

## 2023-05-22 LAB — ECHOCARDIOGRAM COMPLETE
AR max vel: 0.82 cm2
AV Area VTI: 0.78 cm2
AV Area mean vel: 0.82 cm2
AV Mean grad: 39 mm[Hg]
AV Peak grad: 67 mm[Hg]
Ao pk vel: 4.09 m/s
Area-P 1/2: 3.08 cm2
S' Lateral: 3.5 cm

## 2023-05-25 ENCOUNTER — Telehealth: Payer: Self-pay | Admitting: Internal Medicine

## 2023-05-25 ENCOUNTER — Other Ambulatory Visit (HOSPITAL_COMMUNITY): Payer: Self-pay | Admitting: *Deleted

## 2023-05-25 DIAGNOSIS — R0602 Shortness of breath: Secondary | ICD-10-CM

## 2023-05-25 DIAGNOSIS — I350 Nonrheumatic aortic (valve) stenosis: Secondary | ICD-10-CM

## 2023-05-25 DIAGNOSIS — R5383 Other fatigue: Secondary | ICD-10-CM

## 2023-05-25 NOTE — Telephone Encounter (Signed)
 Patient's wife returned call. Relayed echo results and recommendations.

## 2023-05-25 NOTE — Telephone Encounter (Signed)
 Pt  spouse returning call for echo results

## 2023-05-25 NOTE — Telephone Encounter (Signed)
 Derrick JAYSON Maxcy, MD 05/24/2023 10:13 AM EST     Normal LVEF, moderate LVH, biatrial enlargement, severe AS - mean gradient 39 mmHg - afib was noted.  Please refer to the structural heart clinic for their recommendations and to follow -he has reported worsening fatigue and shortness of breath which may be from afib and/or aortic stenosis.   Dr VEAR

## 2023-06-01 ENCOUNTER — Encounter: Payer: Self-pay | Admitting: Cardiovascular Disease

## 2023-06-01 ENCOUNTER — Ambulatory Visit: Payer: Medicare Other | Attending: Cardiovascular Disease | Admitting: Cardiovascular Disease

## 2023-06-01 VITALS — BP 130/70 | HR 78 | Ht 65.0 in | Wt 238.2 lb

## 2023-06-01 DIAGNOSIS — I350 Nonrheumatic aortic (valve) stenosis: Secondary | ICD-10-CM

## 2023-06-01 NOTE — Patient Instructions (Signed)
 Medication Instructions:  No changes *If you need a refill on your cardiac medications before your next appointment, please call your pharmacy*   Lab Work: none   Testing/Procedures: ECHO DUE IN JUNE 2025 Your physician has requested that you have an echocardiogram. Echocardiography is a painless test that uses sound waves to create images of your heart. It provides your doctor with information about the size and shape of your heart and how well your heart's chambers and valves are working. This procedure takes approximately one hour. There are no restrictions for this procedure. Please do NOT wear cologne, perfume, aftershave, or lotions (deodorant is allowed). Please arrive 15 minutes prior to your appointment time.  Please note: We ask at that you not bring children with you during ultrasound (echo/ vascular) testing. Due to room size and safety concerns, children are not allowed in the ultrasound rooms during exams. Our front office staff cannot provide observation of children in our lobby area while testing is being conducted. An adult accompanying a patient to their appointment will only be allowed in the ultrasound room at the discretion of the ultrasound technician under special circumstances. We apologize for any inconvenience.   Follow-Up: At [facility], you and your health needs are our priority.  As part of our continuing mission to provide you with exceptional heart care, we have created designated Provider Care Teams.  These Care Teams include your primary Cardiologist (physician) and Advanced Practice Providers (APPs -  Physician Assistants and Nurse Practitioners) who all work together to provide you with the care you need, when you need it.   Your next appointment:   5 month(s)  Provider:   [name], MD  Other Instructions

## 2023-06-01 NOTE — Progress Notes (Signed)
 [facility] Consult Note  Chief Complaint  Patient presents with   New Patient (Initial Visit)    Aortic stenosis   History of Present Illness: 78 yo male with history of VT, syncope, obesity, tobacco abuse, atrial fibrillation, COPD, HTN, LBBB, RBBB, sleep apnea and aortic stenosis who is here today as a new consult, referred by Dr. Mona, for further discussion regarding his aortic stenosis and possible TAVR. He has persistent atrial fibrillation. He has undergone atrial fib ablation and is on Eliquis . He is not known to have obstructive CAD. Abnormal coronary calcium  score by CT in April 2022 but no previous cardiac cath. He quit smoking in 2019. He has COPD and uses Advair daily. He has been followed for moderate aortic stenosis by Dr. Mona. Echo 05/22/23 with LVEF=55-60%, moderate LVH. Moderate MAC with trivial MR. Severe aortic stenosis with mean gradient of 39 mm Hg. AVA 0.78 cm2, SVI 28, DI 0.25.   He tells me today that he has progressive dyspnea on exertion and fatigue. No chest pain. Some dizziness when bending over. No syncope.  He lives in [place]. He is a retired naval architect. He has full dentures.   Primary Care Physician: Clarice Nottingham, MD Primary Cardiologist: [facility] Referring Cardiologist: Mona  Past Medical History:  Diagnosis Date   A-fib [facility])    Abnormal EKG 06/18/2015   Aortic atherosclerosis (HCC)    AV malformation of GI tract    Bradycardia    Carotid artery disease (HCC)    COPD (chronic obstructive pulmonary disease) (HCC)    COPD with exacerbation (HCC)    Diastolic dysfunction    Faintness 06/18/2015   GIB (gastrointestinal bleeding)    Hepatic steatosis    by imaging   Hypertension    LBBB (left bundle branch block) 06/18/2015   Moderate aortic stenosis    NSVT (nonsustained ventricular tachycardia) (HCC)    Obesity    OSA on CPAP    PVC's (premature ventricular contractions)    Right bundle branch block (RBBB) with left  anterior fascicular block (LAFB)    Syncope 08/06/2015   Tobacco use    V-tach (HCC) 08/06/2015    Past Surgical History:  Procedure Laterality Date   ATRIAL FIBRILLATION ABLATION N/A 09/17/2020   Procedure: ATRIAL FIBRILLATION ABLATION;  Surgeon: Inocencio Soyla Lunger, MD;  Location: [facility] INVASIVE CV LAB;  Service: Cardiovascular;  Laterality: N/A;   BIOPSY  07/10/2022   Procedure: BIOPSY;  Surgeon: Rollin Dover, MD;  Location: [facility] ENDOSCOPY;  Service: Gastroenterology;;   COLONOSCOPY WITH PROPOFOL  N/A 03/07/2019   Procedure: COLONOSCOPY WITH PROPOFOL ;  Surgeon: Rollin Dover, MD;  Location: [facility] ENDOSCOPY;  Service: Endoscopy;  Laterality: N/A;   COLONOSCOPY WITH PROPOFOL  N/A 07/10/2022   Procedure: COLONOSCOPY WITH PROPOFOL ;  Surgeon: Rollin Dover, MD;  Location: [facility] ENDOSCOPY;  Service: Gastroenterology;  Laterality: N/A;   ESOPHAGOGASTRODUODENOSCOPY (EGD) WITH PROPOFOL  N/A 04/28/2022   Procedure: ESOPHAGOGASTRODUODENOSCOPY (EGD) WITH PROPOFOL ;  Surgeon: Rollin Dover, MD;  Location: [facility] ENDOSCOPY;  Service: Gastroenterology;  Laterality: N/A;   ESOPHAGOGASTRODUODENOSCOPY (EGD) WITH PROPOFOL  N/A 07/07/2022   Procedure: ESOPHAGOGASTRODUODENOSCOPY (EGD) WITH PROPOFOL ;  Surgeon: Rollin Dover, MD;  Location: [facility] ENDOSCOPY;  Service: Gastroenterology;  Laterality: N/A;   HOT HEMOSTASIS N/A 04/28/2022   Procedure: HOT HEMOSTASIS (ARGON PLASMA COAGULATION/BICAP);  Surgeon: Rollin Dover, MD;  Location: [facility] ENDOSCOPY;  Service: Gastroenterology;  Laterality: N/A;   HOT HEMOSTASIS N/A 07/07/2022   Procedure: HOT HEMOSTASIS (ARGON PLASMA COAGULATION/BICAP);  Surgeon: Rollin Dover, MD;  Location: [facility] ENDOSCOPY;  Service: Gastroenterology;  Laterality: N/A;   POLYPECTOMY  03/07/2019   Procedure: POLYPECTOMY;  Surgeon: Rollin Dover, MD;  Location: [facility] ENDOSCOPY;  Service: Endoscopy;;   POLYPECTOMY  07/10/2022   Procedure: POLYPECTOMY;  Surgeon: Rollin Dover, MD;  Location: [facility] ENDOSCOPY;  Service:  Gastroenterology;;    Current Outpatient Medications  Medication Sig Dispense Refill   acetaminophen  (TYLENOL ) 325 MG tablet Take 2 tablets (650 mg total) by mouth every 6 (six) hours as needed for mild pain (or Fever >/= 101).     albuterol  (PROVENTIL ) (2.5 MG/3ML) 0.083% nebulizer solution Take 3 mLs (2.5 mg total) by nebulization every 4 (four) hours as needed for wheezing or shortness of breath. 75 mL 12   albuterol  (VENTOLIN  HFA) 108 (90 Base) MCG/ACT inhaler Inhale 2 puffs into the lungs every 6 (six) hours as needed for wheezing or shortness of breath. 8 g 6   apixaban  (ELIQUIS ) 5 MG TABS tablet Take 1 tablet (5 mg total) by mouth 2 (two) times daily. 60 tablet 5   cetirizine (ZYRTEC) 10 MG tablet Take 10 mg by mouth daily.     fluticasone -salmeterol (WIXELA INHUB) 250-50 MCG/ACT AEPB Inhale 1 puff into the lungs in the morning and at bedtime. 60 each 11   furosemide  (LASIX ) 40 MG tablet TAKE 1 AND 1/2 TABLETS(60 MG) BY MOUTH TWICE DAILY 270 tablet 1   latanoprost  (XALATAN ) 0.005 % ophthalmic solution Place 1 drop into both eyes at bedtime.     methimazole (TAPAZOLE) 5 MG tablet Take 5 mg by mouth daily.     metoprolol  tartrate (LOPRESSOR ) 25 MG tablet Take 0.5 tablets (12.5 mg total) by mouth 2 (two) times daily. 90 tablet 3   Polyethyl Glycol-Propyl Glycol (SYSTANE OP) Place 1 drop into both eyes daily as needed (dry eyes).     rosuvastatin  (CRESTOR ) 20 MG tablet Take 20 mg by mouth daily.     No current facility-administered medications for this visit.    Allergies  Allergen Reactions   Cardizem  [Diltiazem ] Shortness Of Breath   Pacerone  [Amiodarone ] Shortness Of Breath, Nausea Only and Other (See Comments)    Abdominal pain Dizziness   Tambocor  [Flecainide ] Shortness Of Breath    Social History   Socioeconomic History   Marital status: Married    Spouse name: Not on file   Number of children: 3   Years of education: Not on file   Highest education level: Not on file   Occupational History   Occupation: retired   Occupation: Psychologist, forensic  Tobacco Use   Smoking status: Former    Current packs/day: 0.00    Average packs/day: 2.0 packs/day for 60.0 years (120.0 ttl pk-yrs)    Types: Cigarettes    Start date: 06/13/1957    Quit date: 06/13/2017    Years since quitting: 5.9   Smokeless tobacco: Never  Vaping Use   Vaping status: Never Used  Substance and Sexual Activity   Alcohol  use: No    Alcohol /week: 0.0 standard drinks of alcohol    Drug use: No   Sexual activity: Not on file  Other Topics Concern   Not on file  Social History Narrative   Not on file   Social Drivers of Health   Financial Resource Strain: Not on file  Food Insecurity: No Food Insecurity (04/28/2022)   Hunger Vital Sign    Worried About Running Out of Food in the Last Year: Never true    Ran Out of Food in the Last Year: Never true  Transportation  Needs: No Transportation Needs (04/28/2022)   PRAPARE - Administrator, Civil Service (Medical): No    Lack of Transportation (Non-Medical): No  Physical Activity: Not on file  Stress: Not on file  Social Connections: Not on file  Intimate Partner Violence: Not At Risk (04/28/2022)   Humiliation, Afraid, Rape, and Kick questionnaire    Fear of Current or Ex-Partner: No    Emotionally Abused: No    Physically Abused: No    Sexually Abused: No    Family History  Problem Relation Age of Onset   Diabetes Mother    Cancer Father    Heart disease Brother    COPD Brother     Review of Systems:  As stated in the HPI and otherwise negative.   BP 130/70   Pulse 78   Ht 5' 5 (1.651 m)   Wt 108 kg   SpO2 97%   BMI 39.64 kg/m   Physical Examination: General: Well developed, well nourished, NAD  HEENT: OP clear, mucus membranes moist  SKIN: warm, dry. No rashes. Neuro: No focal deficits  Musculoskeletal: Muscle strength 5/5 all ext  Psychiatric: Mood and affect normal  Neck: No JVD, no carotid  bruits, no thyromegaly, no lymphadenopathy.  Lungs:Clear bilaterally, no wheezes, rhonci, crackles Cardiovascular: Regular rate and rhythm. Loud, harsh, late peaking systolic murmur.  Abdomen:Soft. Bowel sounds present. Non-tender.  Extremities:  No lower extremity edema. Pulses are 2 + in the bilateral DP/PT.  EKG:  EKG is ordered today. The ekg ordered today demonstrates  EKG Interpretation Date/Time:  Friday June 01 2023 12:22:42 EST Ventricular Rate:  78 PR Interval:  228 QRS Duration:  142 QT Interval:  438 QTC Calculation: 499 R Axis:   -73  Text Interpretation: Sinus rhythm with 1st degree A-V block with frequent Premature ventricular complexes Left axis deviation Right bundle branch block Left ventricular hypertrophy with repolarization abnormality ( R in aVL ) When compared with ECG of 07-May-2023 08:42, Sinus rhythm has replaced Atrial fibrillation Confirmed by Verlin Bruckner [phone]) on 06/01/2023 12:45:13 PM   Echo 05/22/23: 1. Left ventricular ejection fraction, by estimation, is 55 to 60%. The  left ventricle has normal function. The left ventricle has no regional  wall motion abnormalities. There is moderate concentric left ventricular  hypertrophy. Left ventricular  diastolic parameters are indeterminate.   2. Right ventricular systolic function is normal. The right ventricular  size is normal. Tricuspid regurgitation signal is inadequate for assessing  PA pressure.   3. Left atrial size was moderately dilated.   4. Right atrial size was mildly dilated.   5. The mitral valve is degenerative. Trivial mitral valve regurgitation.  No evidence of mitral stenosis. Moderate to severe mitral annular  calcification.   6. The aortic valve was not well visualized. There is severe calcifcation  of the aortic valve. Aortic valve regurgitation is not visualized. Severe  aortic valve stenosis. Aortic valve area, by VTI measures 0.78 cm. Aortic  valve mean gradient  measures  39.0 mmHg.   7. The inferior vena cava is normal in size with greater than 50%  respiratory variability, suggesting right atrial pressure of 3 mmHg.   8. The patient was in atrial fibrillation.   FINDINGS   Left Ventricle: Left ventricular ejection fraction, by estimation, is 55  to 60%. The left ventricle has normal function. The left ventricle has no  regional wall motion abnormalities. The left ventricular internal cavity  size was normal in size. There is  moderate concentric left ventricular hypertrophy. Left ventricular  diastolic parameters are indeterminate.   Right Ventricle: The right ventricular size is normal. No increase in  right ventricular wall thickness. Right ventricular systolic function is  normal. Tricuspid regurgitation signal is inadequate for assessing PA  pressure.   Left Atrium: Left atrial size was moderately dilated.   Right Atrium: Right atrial size was mildly dilated.   Pericardium: There is no evidence of pericardial effusion.   Mitral Valve: The mitral valve is degenerative in appearance. There is  moderate calcification of the mitral valve leaflet(s). Moderate to severe  mitral annular calcification. Trivial mitral valve regurgitation. No  evidence of mitral valve stenosis.   Tricuspid Valve: The tricuspid valve is normal in structure. Tricuspid  valve regurgitation is not demonstrated.   Aortic Valve: The aortic valve was not well visualized. There is severe  calcifcation of the aortic valve. Aortic valve regurgitation is not  visualized. Severe aortic stenosis is present. Aortic valve mean gradient  measures 39.0 mmHg. Aortic valve peak  gradient measures 67.0 mmHg. Aortic valve area, by VTI measures 0.78 cm.   Pulmonic Valve: The pulmonic valve was normal in structure. Pulmonic valve  regurgitation is not visualized.   Aorta: The aortic root is normal in size and structure.   Venous: The inferior vena cava is normal in size  with greater than 50%  respiratory variability, suggesting right atrial pressure of 3 mmHg.   IAS/Shunts: No atrial level shunt detected by color flow Doppler.     LEFT VENTRICLE  PLAX 2D  LVIDd:         5.00 cm   Diastology  LVIDs:         3.50 cm   LV e' medial:    7.13 cm/s  LV PW:         1.60 cm   LV E/e' medial:  19.5  LV IVS:        1.80 cm   LV e' lateral:   11.80 cm/s  LVOT diam:     2.00 cm   LV E/e' lateral: 11.8  LV SV:         59  LV SV Index:   28  LVOT Area:     3.14 cm     IVC  IVC diam: 1.60 cm   LEFT ATRIUM           Index        RIGHT ATRIUM           Index  LA diam:      4.50 cm 2.11 cm/m   RA Pressure: 3.00 mmHg  LA Vol (A2C): 69.9 ml 32.78 ml/m  RA Area:     19.20 cm  LA Vol (A4C): 87.8 ml 41.17 ml/m  RA Volume:   57.60 ml  27.01 ml/m   AORTIC VALVE  AV Area (Vmax):    0.82 cm  AV Area (Vmean):   0.82 cm  AV Area (VTI):     0.78 cm  AV Vmax:           409.40 cm/s  AV Vmean:          274.000 cm/s  AV VTI:            0.762 m  AV Peak Grad:      67.0 mmHg  AV Mean Grad:      39.0 mmHg  LVOT Vmax:         106.24 cm/s  LVOT Vmean:  71.920 cm/s  LVOT VTI:          0.189 m  LVOT/AV VTI ratio: 0.25    AORTA  Ao Root diam: 3.30 cm  Ao Asc diam:  3.60 cm   MITRAL VALVE                TRICUSPID VALVE  MV Area (PHT): 3.08 cm     Estimated RAP:  3.00 mmHg  MV Decel Time: 246 msec  MV E velocity: 138.80 cm/s  SHUNTS                              Systemic VTI:  0.19 m                              Systemic Diam: 2.00 cm   Recent Labs: 07/06/2022: ALT 12 07/07/2022: B Natriuretic Peptide 522.0 07/12/2022: Hemoglobin 7.9; Platelets 217 08/09/2022: BUN 4; Creatinine, Ser 0.95; Magnesium  2.1; Potassium 4.0; Sodium 140; TSH 0.026    Wt Readings from Last 3 Encounters:  06/01/23 108 kg  05/11/23 108.3 kg  05/07/23 105.2 kg    Assessment and Plan:   1. Severe Aortic Valve Stenosis: He has severe  aortic valve stenosis but at this time he is  overall asymptomatic. He has not noticed a significant change in his baseline dyspnea with moderate exertion over the past year. No exertional chest pain or dizziness. NYHA class 2 symptoms. I have personally reviewed the echo images. The aortic valve is thickened and calcified but the valve does seem to open and visually there appears to be moderate stenosis. At this time, I think we can follow his aortic stenosis with serial echocardiograms. He will probably be a candidate for TAVR when he becomes symptomatic. Given his COPD, obesity and advanced age, he is not a great candidate for surgical AVR.  I have reviewed the natural history of aortic stenosis with the patient and their family members  who are present today. We have discussed the limitations of medical therapy and the poor prognosis associated with symptomatic aortic stenosis. We have reviewed potential treatment options, including palliative medical therapy, conventional surgical aortic valve replacement, and transcatheter aortic valve replacement. We discussed treatment options in the context of the patient's specific comorbid medical conditions.   I will plan to repeat his echo in 6 months. He will call back with change in his clinical status.     Labs/ tests ordered today include:  Orders Placed This Encounter  Procedures   EKG 12-Lead   ECHOCARDIOGRAM COMPLETE   Disposition:   F/U with me in 6 months  Signed, [name], MD, [facility] 06/01/2023 1:27 PM    [facility] [address] [place], [place], [place]  [address] Phone: [phone]; Fax: [phone]

## 2023-06-06 ENCOUNTER — Encounter: Payer: Self-pay | Admitting: Cardiovascular Disease

## 2023-06-14 DIAGNOSIS — G629 Polyneuropathy, unspecified: Secondary | ICD-10-CM | POA: Diagnosis not present

## 2023-06-14 DIAGNOSIS — G2581 Restless legs syndrome: Secondary | ICD-10-CM | POA: Diagnosis not present

## 2023-06-18 ENCOUNTER — Other Ambulatory Visit: Payer: Self-pay | Admitting: Internal Medicine

## 2023-06-27 DIAGNOSIS — S161XXA Strain of muscle, fascia and tendon at neck level, initial encounter: Secondary | ICD-10-CM | POA: Diagnosis not present

## 2023-07-26 ENCOUNTER — Other Ambulatory Visit: Payer: Self-pay | Admitting: Cardiology

## 2023-08-01 ENCOUNTER — Other Ambulatory Visit: Payer: Self-pay | Admitting: Internal Medicine

## 2023-08-01 DIAGNOSIS — I4819 Other persistent atrial fibrillation: Secondary | ICD-10-CM

## 2023-08-01 NOTE — Telephone Encounter (Signed)
 Prescription refill request for Eliquis received. Indication:afib Last office visit:1/25 Scr:0.95  3/24 Age: 78 Weight:108  kg  Prescription refilled

## 2023-08-02 ENCOUNTER — Telehealth: Payer: Self-pay | Admitting: Internal Medicine

## 2023-08-02 MED ORDER — METOPROLOL TARTRATE 25 MG PO TABS: 12.5000 mg | ORAL_TABLET | Freq: Two times a day (BID) | ORAL | 2 refills | Status: DC

## 2023-08-02 NOTE — Telephone Encounter (Signed)
 Pt's medication was sent to pt's pharmacy as requested. Confirmation received.

## 2023-08-02 NOTE — Telephone Encounter (Signed)
*  STAT* If patient is at the pharmacy, call can be transferred to refill team.   1. Which medications need to be refilled? (please list name of each medication and dose if known) metoprolol tartrate (LOPRESSOR) 25 MG tablet   2. Which pharmacy/location (including street and city if local pharmacy) is medication to be sent to?  [id number] - [place], [place] - [address]    3. Do they need a 30 day or 90 day supply? 910

## 2023-08-14 DIAGNOSIS — E0590 Thyrotoxicosis, unspecified without thyrotoxic crisis or storm: Secondary | ICD-10-CM | POA: Diagnosis not present

## 2023-08-21 DIAGNOSIS — E0590 Thyrotoxicosis, unspecified without thyrotoxic crisis or storm: Secondary | ICD-10-CM | POA: Diagnosis not present

## 2023-08-21 DIAGNOSIS — E042 Nontoxic multinodular goiter: Secondary | ICD-10-CM | POA: Diagnosis not present

## 2023-09-18 DIAGNOSIS — I252 Old myocardial infarction: Secondary | ICD-10-CM | POA: Diagnosis not present

## 2023-09-18 DIAGNOSIS — I10 Essential (primary) hypertension: Secondary | ICD-10-CM | POA: Diagnosis not present

## 2023-09-18 DIAGNOSIS — I480 Paroxysmal atrial fibrillation: Secondary | ICD-10-CM | POA: Diagnosis not present

## 2023-10-01 DIAGNOSIS — H25012 Cortical age-related cataract, left eye: Secondary | ICD-10-CM | POA: Diagnosis not present

## 2023-10-01 DIAGNOSIS — H2512 Age-related nuclear cataract, left eye: Secondary | ICD-10-CM | POA: Diagnosis not present

## 2023-10-01 DIAGNOSIS — Z961 Presence of intraocular lens: Secondary | ICD-10-CM | POA: Diagnosis not present

## 2023-10-01 DIAGNOSIS — H401131 Primary open-angle glaucoma, bilateral, mild stage: Secondary | ICD-10-CM | POA: Diagnosis not present

## 2023-10-24 ENCOUNTER — Other Ambulatory Visit: Payer: Self-pay | Admitting: Acute Care

## 2023-10-24 ENCOUNTER — Encounter: Payer: Self-pay | Admitting: Acute Care

## 2023-10-24 DIAGNOSIS — Z87891 Personal history of nicotine dependence: Secondary | ICD-10-CM

## 2023-10-24 DIAGNOSIS — Z122 Encounter for screening for malignant neoplasm of respiratory organs: Secondary | ICD-10-CM

## 2023-10-25 ENCOUNTER — Ambulatory Visit (HOSPITAL_COMMUNITY)
Admission: RE | Admit: 2023-10-25 | Discharge: 2023-10-25 | Disposition: A | Discharge status: Home / Self Care | Payer: Medicare Other | Source: Ambulatory Visit | Attending: Internal Medicine | Admitting: Internal Medicine

## 2023-10-25 DIAGNOSIS — I350 Nonrheumatic aortic (valve) stenosis: Secondary | ICD-10-CM

## 2023-10-25 LAB — ECHOCARDIOGRAM COMPLETE
AR max vel: 1.12 cm2
AV Area VTI: 1.21 cm2
AV Area mean vel: 1.07 cm2
AV Mean grad: 26 mmHg
AV Peak grad: 46.6 mmHg
Ao pk vel: 3.41 m/s
Area-P 1/2: 4.39 cm2
S' Lateral: 3.3 cm

## 2023-10-25 MED ORDER — PERFLUTREN LIPID MICROSPHERE
1.0000 mL | INTRAVENOUS | Status: AC | PRN
  Administered 2023-10-25: 2 mL via INTRAVENOUS

## 2023-10-26 ENCOUNTER — Ambulatory Visit: Payer: Self-pay | Admitting: Cardiovascular Disease

## 2023-10-30 NOTE — Progress Notes (Unsigned)
 [facility] Note  Chief Complaint  Patient presents with   Follow-up    Aortic stenosis   History of Present Illness: 78 yo male with history of VT, syncope, obesity, tobacco abuse, atrial fibrillation, COPD, HTN, LBBB, RBBB, sleep apnea and aortic stenosis who is here today for follow up. I saw him as a new consult in January 2025 to discuss his aortic stenosis and possible TAVR. He has persistent atrial fibrillation. He has undergone atrial fib ablation and is on Eliquis . He is not known to have obstructive CAD. Abnormal coronary calcium  score by CT in April 2022 but no previous cardiac cath. He quit smoking in 2019. He has COPD and uses Advair daily. He has been followed for moderate aortic stenosis by Dr. Maximo Spar. Echo 05/22/23 with LVEF=55-60%, moderate LVH. Moderate MAC with trivial MR. Moderatelto severe aortic stenosis with mean gradient of 39 mm Hg. AVA 0.78 cm2, SVI 28, DI 0.25. He was asymptomatic at his first visit here in January 2025. Echo June 2025 with LVEF=60-65%. Moderate aortic stenosis with mean gradient 26 mmHg, AVA 1.07 cm2, DI  0.35, SVI 42.   He is here today for follow up. The patient denies any chest pain, palpitations, lower extremity edema, orthopnea, PND, dizziness, near syncope or syncope. Baseline dyspnea.   He lives in [place]. He is a retired Naval architect. He has full dentures.   Primary Care Physician: Imelda Man, MD Primary Cardiologist: [facility] Referring Cardiologist: Maximo Spar  Past Medical History:  Diagnosis Date   A-fib [facility])    Abnormal EKG 06/18/2015   Aortic atherosclerosis (HCC)    AV malformation of GI tract    Bradycardia    Carotid artery disease (HCC)    COPD (chronic obstructive pulmonary disease) (HCC)    COPD with exacerbation (HCC)    Diastolic dysfunction    Faintness 06/18/2015   GIB (gastrointestinal bleeding)    Hepatic steatosis    by imaging   Hypertension    LBBB (left bundle branch block) 06/18/2015    Moderate aortic stenosis    NSVT (nonsustained ventricular tachycardia) (HCC)    Obesity    OSA on CPAP    PVC's (premature ventricular contractions)    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 (LAFB)    Syncope 08/06/2015   Tobacco use    V-tach (HCC) 08/06/2015    Past Surgical History:  Procedure Laterality Date   ATRIAL FIBRILLATION ABLATION N/A 09/17/2020   Procedure: ATRIAL FIBRILLATION ABLATION;  Surgeon: Lei Pump, MD;  Location: [facility] INVASIVE CV LAB;  Service: Cardiovascular;  Laterality: N/A;   BIOPSY  07/10/2022   Procedure: BIOPSY;  Surgeon: Alvis Jourdain, MD;  Location: [facility] ENDOSCOPY;  Service: Gastroenterology;;   COLONOSCOPY WITH PROPOFOL  N/A 03/07/2019   Procedure: COLONOSCOPY WITH PROPOFOL ;  Surgeon: Alvis Jourdain, MD;  Location: [facility] ENDOSCOPY;  Service: Endoscopy;  Laterality: N/A;   COLONOSCOPY WITH PROPOFOL  N/A 07/10/2022   Procedure: COLONOSCOPY WITH PROPOFOL ;  Surgeon: Alvis Jourdain, MD;  Location: [facility] ENDOSCOPY;  Service: Gastroenterology;  Laterality: N/A;   ESOPHAGOGASTRODUODENOSCOPY (EGD) WITH PROPOFOL  N/A 04/28/2022   Procedure: ESOPHAGOGASTRODUODENOSCOPY (EGD) WITH PROPOFOL ;  Surgeon: Alvis Jourdain, MD;  Location: [facility] ENDOSCOPY;  Service: Gastroenterology;  Laterality: N/A;   ESOPHAGOGASTRODUODENOSCOPY (EGD) WITH PROPOFOL  N/A 07/07/2022   Procedure: ESOPHAGOGASTRODUODENOSCOPY (EGD) WITH PROPOFOL ;  Surgeon: Alvis Jourdain, MD;  Location: [facility] ENDOSCOPY;  Service: Gastroenterology;  Laterality: N/A;   HOT HEMOSTASIS N/A 04/28/2022   Procedure: HOT HEMOSTASIS (ARGON PLASMA COAGULATION/BICAP);  Surgeon:  Alvis Jourdain, MD;  Location: [facility] ENDOSCOPY;  Service: Gastroenterology;  Laterality: N/A;   HOT HEMOSTASIS N/A 07/07/2022   Procedure: HOT HEMOSTASIS (ARGON PLASMA COAGULATION/BICAP);  Surgeon: Alvis Jourdain, MD;  Location: [facility] ENDOSCOPY;  Service: Gastroenterology;  Laterality: N/A;   POLYPECTOMY  03/07/2019   Procedure: POLYPECTOMY;   Surgeon: Alvis Jourdain, MD;  Location: [facility] ENDOSCOPY;  Service: Endoscopy;;   POLYPECTOMY  07/10/2022   Procedure: POLYPECTOMY;  Surgeon: Alvis Jourdain, MD;  Location: [facility] ENDOSCOPY;  Service: Gastroenterology;;    Current Outpatient Medications  Medication Sig Dispense Refill   acetaminophen  (TYLENOL ) 325 MG tablet Take 2 tablets (650 mg total) by mouth every 6 (six) hours as needed for mild pain (or Fever >/= 101).     albuterol  (PROVENTIL ) (2.5 MG/3ML) 0.083% nebulizer solution Take 3 mLs (2.5 mg total) by nebulization every 4 (four) hours as needed for wheezing or shortness of breath. 75 mL 12   albuterol  (VENTOLIN  HFA) 108 (90 Base) MCG/ACT inhaler Inhale 2 puffs into the lungs every 6 (six) hours as needed for wheezing or shortness of breath. 8 g 6   cetirizine (ZYRTEC) 10 MG tablet Take 10 mg by mouth daily.     ELIQUIS  5 MG TABS tablet TAKE 1 TABLET(5 MG) BY MOUTH TWICE DAILY 60 tablet 5   fluticasone -salmeterol (WIXELA INHUB) 250-50 MCG/ACT AEPB Inhale 1 puff into the lungs in the morning and at bedtime. 60 each 11   furosemide  (LASIX ) 40 MG tablet TAKE 1 AND 1/2 TABLETS(60 MG) BY MOUTH TWICE DAILY 270 tablet 2   latanoprost  (XALATAN ) 0.005 % ophthalmic solution Place 1 drop into both eyes at bedtime.     methimazole (TAPAZOLE) 5 MG tablet Take 5 mg by mouth daily.     metoprolol  tartrate (LOPRESSOR ) 25 MG tablet Take 0.5 tablets (12.5 mg total) by mouth 2 (two) times daily. 90 tablet 2   Polyethyl Glycol-Propyl Glycol (SYSTANE OP) Place 1 drop into both eyes daily as needed (dry eyes).     rosuvastatin  (CRESTOR ) 20 MG tablet Take 20 mg by mouth daily.     No current facility-administered medications for this visit.    Allergies  Allergen Reactions   Cardizem  [Diltiazem ] Shortness Of Breath   Pacerone  [Amiodarone ] Shortness Of Breath, Nausea Only and Other (See Comments)    Abdominal pain Dizziness   Tambocor  [Flecainide ] Shortness Of Breath    Social History   Socioeconomic  History   Marital status: Married    Spouse name: Not on file   Number of children: 3   Years of education: Not on file   Highest education level: Not on file  Occupational History   Occupation: retired   Occupation: Psychologist, forensic  Tobacco Use   Smoking status: Former    Current packs/day: 0.00    Average packs/day: 2.0 packs/day for 60.0 years (120.0 ttl pk-yrs)    Types: Cigarettes    Start date: 06/13/1957    Quit date: 06/13/2017    Years since quitting: 6.3   Smokeless tobacco: Never  Vaping Use   Vaping status: Never Used  Substance and Sexual Activity   Alcohol  use: No    Alcohol /week: 0.0 standard drinks of alcohol    Drug use: No   Sexual activity: Not on file  Other Topics Concern   Not on file  Social History Narrative   Not on file   Social Drivers of Health   Financial Resource Strain: Not on file  Food Insecurity: No Food Insecurity (04/28/2022)   Hunger  Vital Sign    Worried About Programme researcher, broadcasting/film/video in the Last Year: Never true    Ran Out of Food in the Last Year: Never true  Transportation Needs: No Transportation Needs (04/28/2022)   PRAPARE - Administrator, Civil Service (Medical): No    Lack of Transportation (Non-Medical): No  Physical Activity: Not on file  Stress: Not on file  Social Connections: Not on file  Intimate Partner Violence: Not At Risk (04/28/2022)   Humiliation, Afraid, Rape, and Kick questionnaire    Fear of Current or Ex-Partner: No    Emotionally Abused: No    Physically Abused: No    Sexually Abused: No    Family History  Problem Relation Age of Onset   Diabetes Mother    Cancer Father    Heart disease Brother    COPD Brother     Review of Systems:  As stated in the HPI and otherwise negative.   BP 134/86   Pulse (!) 43   Ht 5' 5 (1.651 m)   Wt 237 lb 9.6 oz (107.8 kg)   SpO2 97%   BMI 39.54 kg/m   Physical Examination:  General: Well developed, well nourished, NAD  HEENT: OP clear, mucus  membranes moist  SKIN: warm, dry. No rashes. Neuro: No focal deficits  Musculoskeletal: Muscle strength 5/5 all ext  Psychiatric: Mood and affect normal  Neck: No JVD, no carotid bruits, no thyromegaly, no lymphadenopathy.  Lungs:Clear bilaterally, no wheezes, rhonci, crackles Cardiovascular: Regular rate and rhythm. Harsh systolic murmur.  Abdomen:Soft. Bowel sounds present. Non-tender.  Extremities: No lower extremity edema. Pulses are 2 + in the bilateral DP/PT.  EKG:  EKG is ordered today. The ekg ordered today demonstrates  EKG Interpretation Date/Time:  Wednesday October 31 2023 08:42:17 EDT Ventricular Rate:  64 PR Interval:  196 QRS Duration:  144 QT Interval:  470 QTC Calculation: 484 R Axis:   -67  Text Interpretation: Sinus bradycardia with frequent Premature ventricular complexes Left axis deviation Right bundle branch block Minimal voltage criteria for LVH, may be normal variant ( R in aVL ) When compared with ECG of 01-Jun-2023 12:22, PR interval has decreased Confirmed by Antoinette Batman [phone]) on 10/31/2023 8:58:40 AM    Echo 10/25/23:  1. Left ventricular ejection fraction, by estimation, is 60 to 65%. The  left ventricle has normal function. The left ventricle has no regional  wall motion abnormalities. Left ventricular diastolic parameters are  consistent with Grade II diastolic  dysfunction (pseudonormalization). Elevated left ventricular end-diastolic  pressure.   2. Right ventricular systolic function is normal. The right ventricular  size is normal.   3. Left atrial size was moderately dilated.   4. The mitral valve is normal in structure. Trivial mitral valve  regurgitation. No evidence of mitral stenosis.   5. The aortic valve is normal in structure. There is severe calcifcation  of the aortic valve. There is severe thickening of the aortic valve.  Aortic valve regurgitation is mild. Moderate aortic valve stenosis. Aortic  valve area, by VTI measures  1.21  cm. Aortic valve mean gradient measures 26.0 mmHg. Aortic valve Vmax  measures 3.41 m/s.   6. The inferior vena cava is dilated in size with <50% respiratory  variability, suggesting right atrial pressure of 15 mmHg.   FINDINGS   Left Ventricle: Left ventricular ejection fraction, by estimation, is 60  to 65%. The left ventricle has normal function. The left ventricle has no  regional  wall motion abnormalities. Definity  contrast agent was given IV  to delineate the left ventricular   endocardial borders. The left ventricular internal cavity size was normal  in size. There is no left ventricular hypertrophy. Left ventricular  diastolic parameters are consistent with Grade II diastolic dysfunction  (pseudonormalization). Elevated left  ventricular end-diastolic pressure.   Right Ventricle: The right ventricular size is normal. No increase in  right ventricular wall thickness. Right ventricular systolic function is  normal.   Left Atrium: Left atrial size was moderately dilated.   Right Atrium: Right atrial size was normal in size.   Pericardium: There is no evidence of pericardial effusion.   Mitral Valve: The mitral valve is normal in structure. Mild mitral annular  calcification. Trivial mitral valve regurgitation. No evidence of mitral  valve stenosis.   Tricuspid Valve: The tricuspid valve is normal in structure. Tricuspid  valve regurgitation is trivial. No evidence of tricuspid stenosis.   The aortic valve is normal in structure. There is severe calcifcation of  the aortic valve. There is severe thickening of the aortic valve. Aortic  valve regurgitation is mild. Moderate aortic stenosis is present.  Pulmonic Valve: The pulmonic valve was normal in structure. Pulmonic valve  regurgitation is not visualized. No evidence of pulmonic stenosis.   Aorta: The aortic root is normal in size and structure.   Venous: The inferior vena cava is dilated in size with less than  50%  respiratory variability, suggesting right atrial pressure of 15 mmHg.   IAS/Shunts: No atrial level shunt detected by color flow Doppler.     LEFT VENTRICLE  PLAX 2D  LVIDd:         5.30 cm   Diastology  LVIDs:         3.30 cm   LV e' medial:    5.93 cm/s  LV PW:         1.10 cm   LV E/e' medial:  20.5  LV IVS:        1.10 cm   LV e' lateral:   6.58 cm/s  LVOT diam:     2.10 cm   LV E/e' lateral: 18.5  LV SV:         90  LV SV Index:   42  LVOT Area:     3.46 cm     RIGHT VENTRICLE             IVC  RV Basal diam:  4.10 cm     IVC diam: 2.00 cm  RV S prime:     10.75 cm/s  TAPSE (M-mode): 2.1 cm   LEFT ATRIUM             Index        RIGHT ATRIUM           Index  LA diam:        5.40 cm 2.53 cm/m   RA Area:     15.70 cm  LA Vol (A2C):   61.6 ml 28.91 ml/m  RA Volume:   42.70 ml  20.04 ml/m  LA Vol (A4C):   80.6 ml 37.83 ml/m  LA Biplane Vol: 72.3 ml 33.93 ml/m   AORTIC VALVE  AV Area (Vmax):    1.12 cm  AV Area (Vmean):   1.07 cm  AV Area (VTI):     1.21 cm  AV Vmax:           341.25 cm/s  AV Vmean:  237.250 cm/s  AV VTI:            0.746 m  AV Peak Grad:      46.6 mmHg  AV Mean Grad:      26.0 mmHg  LVOT Vmax:         110.00 cm/s  LVOT Vmean:        73.600 cm/s  LVOT VTI:          0.260 m  LVOT/AV VTI ratio: 0.35    AORTA  Ao Root diam: 3.80 cm  Ao Asc diam:  3.90 cm   MITRAL VALVE  MV Area (PHT): 4.39 cm     SHUNTS  MV Decel Time: 173 msec     Systemic VTI:  0.26 m  MV E velocity: 121.50 cm/s  Systemic Diam: 2.10 cm  MV A velocity: 80.35 cm/s  MV E/A ratio:  1.51   Recent Labs: No results found for requested labs within last 365 days.    Wt Readings from Last 3 Encounters:  10/31/23 237 lb 9.6 oz (107.8 kg)  06/01/23 238 lb 3.2 oz (108 kg)  05/11/23 238 lb 11.2 oz (108.3 kg)    Assessment and Plan:   1. Severe Aortic Valve Stenosis: His aortic stenosis is moderate to severe. He remains asymptomatic. No changed in baseline  dyspnea. NYHA class 2. The aortic valve is thickened but opens reasonably well. He will most likely be treated with TAVR when he becomes symptomatic. Given his COPD, obesity and advanced age, he is not a great candidate for surgical AVR.  I have reviewed the natural history of aortic stenosis with the patient and their family members  who are present today. We have discussed the limitations of medical therapy and the poor prognosis associated with symptomatic aortic stenosis. We have reviewed potential treatment options, including palliative medical therapy, conventional surgical aortic valve replacement, and transcatheter aortic valve replacement. We discussed treatment options in the context of the patient's specific comorbid medical conditions.   Repeat echo in 6 months.      Labs/ tests ordered today include:  Orders Placed This Encounter  Procedures   EKG 12-Lead   ECHOCARDIOGRAM COMPLETE   Disposition:   F/U with me in 6 months  Signed, Antoinette Batman, MD, [facility] 10/31/2023 10:07 AM    [facility] [address] [place], [place], [place]  [address] Phone: [phone]; Fax: [phone]

## 2023-10-31 ENCOUNTER — Ambulatory Visit: Payer: Medicare Other | Attending: Cardiovascular Disease | Admitting: Cardiovascular Disease

## 2023-10-31 ENCOUNTER — Encounter: Payer: Self-pay | Admitting: Cardiovascular Disease

## 2023-10-31 VITALS — BP 134/86 | HR 43 | Ht 65.0 in | Wt 237.6 lb

## 2023-10-31 DIAGNOSIS — I350 Nonrheumatic aortic (valve) stenosis: Secondary | ICD-10-CM | POA: Diagnosis not present

## 2023-10-31 NOTE — Patient Instructions (Addendum)
 Medication Instructions:  No changes *If you need a refill on your cardiac medications before your next appointment, please call your pharmacy*  Lab Work: none If you have labs (blood work) drawn today and your tests are completely normal, you will receive your results only by: MyChart Message (if you have MyChart) OR A paper copy in the mail If you have any lab test that is abnormal or we need to change your treatment, we will call you to review the results.  Testing/Procedures: Your physician has requested that you have an echocardiogram. Echocardiography is a painless test that uses sound waves to create images of your heart. It provides your doctor with information about the size and shape of your heart and how well your heart's chambers and valves are working. This procedure takes approximately one hour. There are no restrictions for this procedure. Please do NOT wear cologne, perfume, aftershave, or lotions (deodorant is allowed). Please arrive 15 minutes prior to your appointment time.  Please note: We ask at that you not bring children with you during ultrasound (echo/ vascular) testing. Due to room size and safety concerns, children are not allowed in the ultrasound rooms during exams. Our front office staff cannot provide observation of children in our lobby area while testing is being conducted. An adult accompanying a patient to their appointment will only be allowed in the ultrasound room at the discretion of the ultrasound technician under special circumstances. We apologize for any inconvenience.   Follow-Up: 6 months  after echo

## 2023-11-09 ENCOUNTER — Ambulatory Visit (HOSPITAL_COMMUNITY)
Admission: RE | Admit: 2023-11-09 | Discharge: 2023-11-09 | Disposition: A | Discharge status: Home / Self Care | Source: Ambulatory Visit | Attending: Acute Care | Admitting: Acute Care

## 2023-11-09 DIAGNOSIS — R911 Solitary pulmonary nodule: Secondary | ICD-10-CM | POA: Diagnosis not present

## 2023-11-09 DIAGNOSIS — Z87891 Personal history of nicotine dependence: Secondary | ICD-10-CM | POA: Diagnosis not present

## 2023-11-09 DIAGNOSIS — I700 Atherosclerosis of aorta: Secondary | ICD-10-CM | POA: Insufficient documentation

## 2023-11-09 DIAGNOSIS — Z122 Encounter for screening for malignant neoplasm of respiratory organs: Secondary | ICD-10-CM | POA: Diagnosis not present

## 2023-11-09 DIAGNOSIS — J439 Emphysema, unspecified: Secondary | ICD-10-CM | POA: Diagnosis not present

## 2023-11-13 ENCOUNTER — Ambulatory Visit: Payer: Medicare Other | Attending: Internal Medicine | Admitting: Internal Medicine

## 2023-11-13 ENCOUNTER — Encounter: Payer: Self-pay | Admitting: Internal Medicine

## 2023-11-13 VITALS — BP 166/70 | HR 62 | Ht 66.0 in | Wt 240.4 lb

## 2023-11-13 DIAGNOSIS — I480 Paroxysmal atrial fibrillation: Secondary | ICD-10-CM

## 2023-11-13 DIAGNOSIS — I447 Left bundle-branch block, unspecified: Secondary | ICD-10-CM

## 2023-11-13 DIAGNOSIS — I350 Nonrheumatic aortic (valve) stenosis: Secondary | ICD-10-CM | POA: Diagnosis not present

## 2023-11-13 NOTE — Patient Instructions (Addendum)
 Medication Instructions:  Your physician recommends that you continue on your current medications as directed. Please refer to the Current Medication list given to you today.  *If you need a refill on your cardiac medications before your next appointment, please call your pharmacy*  Follow-Up: At [facility], you and your health needs are our priority.  As part of our continuing mission to provide you with exceptional heart care, our providers are all part of one team.  This team includes your primary Cardiologist (physician) and Advanced Practice Providers or APPs (Physician Assistants and Nurse Practitioners) who all work together to provide you with the care you need, when you need it.  Your next appointment:   In 6 mo with Dr. Mona

## 2023-11-13 NOTE — Progress Notes (Signed)
 OFFICE NOTE  Chief Complaint:  Follow-up  Primary Care Physician: Clarice Nottingham, MD  HPI:  Derrick Alexander is a pleasant 78-year-old male with no significant past medical history. He only takes aspirin occasionally as needed. Unfortunately he's a 60 year smoker that currently smoking. Recently he's had 3 different episodes of what seemed to be brief syncope. All of the episodes were at rest and he was seated at the time. His wife said that he was talking with her and then briefly seem to lose consciousness but then was easily awakened and was not confused. He did not slump over lose control of bowel or bladder function. There was no seizure-like activity. He reported some mild prodrome to the symptoms. In fact he felt some tingling and numbness around his scalp that felt like he was wearing a tight baseball cap as well as some graying of his vision. He underwent carotid Dopplers 2 days ago and those results are pending. Of note his blood pressure was mildly elevated today but he has no history of hypertension in fact he was thought to be possibly hypotensive or orthostatic. He denies any chest pain or worsening shortness of breath. His EKG however shows left bundle branch pattern as well as ST and T wave abnormalities laterally with 1 mm of horizontal ST depression at rest concerning for ischemia.  [name] returns to follow-up on his monitor. Monitor shows NSVT up 16 beat runs and periods of ventricular escape rhythm in the 30's.  Either could explain syncope. His nuclear stress test was negative for ischemia with normal LV function. Suspect he will likely need AICD/PPM. I contacted Mr. Humber to recommend an urgent referral but he wished to discuss it further with me in the office first. He understands that these arrhythmias could be life-threatening.  07/23/2018  [name] is seen today in follow-up.  I am not seen him in the last couple years when he was initially having ventricular tachycardia and  I referred him to Dr. Inocencio.  He has been managed there and fortunately is had no other significant VT.  He does have a EF and has been appropriately anticoagulated and managed for that.  He is maintaining sinus rhythm.  He has been referred back to me for general cardiology needs.  He does have morbid obesity, recently having significant weight gain after he stopped smoking about a year ago.  He remains on the nicotine  patch.  EKG is stable showing bifascicular block but normal sinus rhythm.  Blood pressure is at goal today.  His cholesterol is elevated based on numbers in June 2019.  His total cholesterol is 219, HDL 43, LDL 135 and triglycerides 794.  Hemoglobin A1c of 6.0.  Additionally, he does have a history of snoring and witnessed apnea.  This is persisted and he has significant daytime fatigue and somnolence.  His EPWSS score is 17, highly suggestive of sleep apnea.  He did seem interested in a sleep study.  02/03/2019  [name] is seen today in follow-up.  Overall he seems to be doing well.  He denies any recurrent arrhythmias.  EKG today shows sinus rhythm at 64.  He denies any chest pain.  I am not aware that he underwent his sleep study.  He apparently was set up for home sleep study.  There were a couple appointments that were canceled and he says his wife has more knowledge about what actually happened but he did recall having had a home sleep test.  Perhaps this was arranged through his primary care provider.  02/03/2020  [name] is seen today for annual follow-up. Which is seen by Dr. Inocencio a week ago. He declines any further NSVT. He was noted to be on diltiazem  and flecainide  however his wife reports he does not take diltiazem . This was discontinued due to bradycardia. Basically he is only on Xarelto . I think he was also taken off of flecainide  due to significant interactions. Is not clear if Dr. Inocencio is aware that he was not on either of these medications. I will mention this to  him.  11/10/2022  [name] is seen today in follow-up.  Have not seen him in quite some time.  Earlier this year he had a GI bleed, in fact he had 2 GI bleeds and was switched from Xarelto  over to Eliquis .  He has not had any worsening palpitations but did have frequent PVCs.  Repeat echo this spring showed normal LV function however he has a least moderate aortic stenosis.  The gradients could not be reliably measured.  He says he denies any shortness of breath or chest pain.  EKG today shows sinus rhythm with frequent PVCs.  He has had an issue with hyperthyroidism was found to have some nodules and is undergoing workup through his primary care provider.  He is on Tapazole.  Being hyperthyroid and anemic is also contributing probably to increased outflow gradient.  05/07/2023  [name] returns today for follow-up of aortic stenosis.  He had an echo in August which showed probable severe aortic stenosis with a mean gradient of 36 mmHg and aortic valve area of 0.5 cm.  The DI was 0.21.  He apparently was asymptomatic.  I asked him again today whether or not he is having any symptoms.  Plan was to repeat his echo around this time to see if he had any further progression.  He has had paroxysmal atrial fibrillation.  While he was in a sinus rhythm previously he is now in A-fib today.  He is also having PVCs which were noted before.  He is anticoagulated on Eliquis .  He does have a history of GI bleeding but has not had any recently.  He does report some worsening fatigue.  He denies any chest pain.  11/13/2023  [name] is seen today in follow-up.  He had recently seen Medford Cash about 2 weeks ago for follow-up of an echo.  This shows what is felt to be moderate aortic stenosis by gradients.  Surprisingly his echo mean gradient was measured lower at 26 mmHg but previously had been 39 mmHg.  He remains asymptomatic.  He has had no further syncopal episodes.  He does report some shortness of breath when  walking back from taking his trash cans out to the street.  He reports poor sleep and daytime somnolence.  He had sleep study about a year ago through [facility] pulmonary but has been noncompliant with CPAP recently due to issues with his device.  I have encouraged them to get new equipment and start back on this.  PMHx: Past Medical History:  Diagnosis Date   A-fib [facility])    Abnormal EKG 06/18/2015   Aortic atherosclerosis (HCC)    AV malformation of GI tract    Bradycardia    Carotid artery disease (HCC)    COPD (chronic obstructive pulmonary disease) (HCC)    COPD with exacerbation (HCC)    Diastolic dysfunction    Faintness 06/18/2015   GIB (gastrointestinal  bleeding)    Hepatic steatosis    by imaging   Hypertension    LBBB (left bundle branch block) 06/18/2015   Moderate aortic stenosis    NSVT (nonsustained ventricular tachycardia) (HCC)    Obesity    OSA on CPAP    PVC's (premature ventricular contractions)    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 (LAFB)    Syncope 08/06/2015   Tobacco use    V-tach (HCC) 08/06/2015    Past Surgical History:  Procedure Laterality Date   ATRIAL FIBRILLATION ABLATION N/A 09/17/2020   Procedure: ATRIAL FIBRILLATION ABLATION;  Surgeon: Inocencio Soyla Lunger, MD;  Location: [facility] INVASIVE CV LAB;  Service: Cardiovascular;  Laterality: N/A;   BIOPSY  07/10/2022   Procedure: BIOPSY;  Surgeon: Rollin Dover, MD;  Location: [facility] ENDOSCOPY;  Service: Gastroenterology;;   COLONOSCOPY WITH PROPOFOL  N/A 03/07/2019   Procedure: COLONOSCOPY WITH PROPOFOL ;  Surgeon: Rollin Dover, MD;  Location: [facility] ENDOSCOPY;  Service: Endoscopy;  Laterality: N/A;   COLONOSCOPY WITH PROPOFOL  N/A 07/10/2022   Procedure: COLONOSCOPY WITH PROPOFOL ;  Surgeon: Rollin Dover, MD;  Location: [facility] ENDOSCOPY;  Service: Gastroenterology;  Laterality: N/A;   ESOPHAGOGASTRODUODENOSCOPY (EGD) WITH PROPOFOL  N/A 04/28/2022   Procedure: ESOPHAGOGASTRODUODENOSCOPY  (EGD) WITH PROPOFOL ;  Surgeon: Rollin Dover, MD;  Location: [facility] ENDOSCOPY;  Service: Gastroenterology;  Laterality: N/A;   ESOPHAGOGASTRODUODENOSCOPY (EGD) WITH PROPOFOL  N/A 07/07/2022   Procedure: ESOPHAGOGASTRODUODENOSCOPY (EGD) WITH PROPOFOL ;  Surgeon: Rollin Dover, MD;  Location: [facility] ENDOSCOPY;  Service: Gastroenterology;  Laterality: N/A;   HOT HEMOSTASIS N/A 04/28/2022   Procedure: HOT HEMOSTASIS (ARGON PLASMA COAGULATION/BICAP);  Surgeon: Rollin Dover, MD;  Location: [facility];  Service: Gastroenterology;  Laterality: N/A;   HOT HEMOSTASIS N/A 07/07/2022   Procedure: HOT HEMOSTASIS (ARGON PLASMA COAGULATION/BICAP);  Surgeon: Rollin Dover, MD;  Location: [facility] ENDOSCOPY;  Service: Gastroenterology;  Laterality: N/A;   POLYPECTOMY  03/07/2019   Procedure: POLYPECTOMY;  Surgeon: Rollin Dover, MD;  Location: [facility] ENDOSCOPY;  Service: Endoscopy;;   POLYPECTOMY  07/10/2022   Procedure: POLYPECTOMY;  Surgeon: Rollin Dover, MD;  Location: [facility] ENDOSCOPY;  Service: Gastroenterology;;    FAMHx:  Family History  Problem Relation Age of Onset   Diabetes Mother    Cancer Father    Heart disease Brother    COPD Brother     SOCHx:   reports that he quit smoking about 6 years ago. His smoking use included cigarettes. He started smoking about 66 years ago. He has a 120 pack-year smoking history. He has never used smokeless tobacco. He reports that he does not drink alcohol  and does not use drugs.  ALLERGIES:  Allergies  Allergen Reactions   Cardizem  [Diltiazem ] Shortness Of Breath   Pacerone  [Amiodarone ] Shortness Of Breath, Nausea Only and Other (See Comments)    Abdominal pain Dizziness   Tambocor  [Flecainide ] Shortness Of Breath    ROS: Pertinent items noted in HPI and remainder of comprehensive ROS otherwise negative.  HOME MEDS: Current Outpatient Medications  Medication Sig Dispense Refill   acetaminophen  (TYLENOL ) 325 MG tablet Take 2 tablets (650 mg total) by mouth every 6 (six)  hours as needed for mild pain (or Fever >/= 101).     albuterol  (PROVENTIL ) (2.5 MG/3ML) 0.083% nebulizer solution Take 3 mLs (2.5 mg total) by nebulization every 4 (four) hours as needed for wheezing or shortness of breath. 75 mL 12   albuterol  (VENTOLIN  HFA) 108 (90 Base) MCG/ACT inhaler Inhale 2 puffs into the lungs every 6 (six) hours as needed  for wheezing or shortness of breath. 8 g 6   cetirizine (ZYRTEC) 10 MG tablet Take 10 mg by mouth daily.     ELIQUIS  5 MG TABS tablet TAKE 1 TABLET(5 MG) BY MOUTH TWICE DAILY 60 tablet 5   fluticasone -salmeterol (WIXELA INHUB) 250-50 MCG/ACT AEPB Inhale 1 puff into the lungs in the morning and at bedtime. 60 each 11   furosemide  (LASIX ) 40 MG tablet TAKE 1 AND 1/2 TABLETS(60 MG) BY MOUTH TWICE DAILY 270 tablet 2   latanoprost  (XALATAN ) 0.005 % ophthalmic solution Place 1 drop into both eyes at bedtime.     methimazole (TAPAZOLE) 5 MG tablet Take 5 mg by mouth daily.     metoprolol  tartrate (LOPRESSOR ) 25 MG tablet Take 0.5 tablets (12.5 mg total) by mouth 2 (two) times daily. 90 tablet 2   Polyethyl Glycol-Propyl Glycol (SYSTANE OP) Place 1 drop into both eyes daily as needed (dry eyes).     rosuvastatin  (CRESTOR ) 20 MG tablet Take 20 mg by mouth daily.     No current facility-administered medications for this visit.    LABS/IMAGING: No results found for this or any previous visit (from the past 48 hours). No results found.  WEIGHTS: Wt Readings from Last 3 Encounters:  11/13/23 240 lb 6.4 oz (109 kg)  10/31/23 237 lb 9.6 oz (107.8 kg)  06/01/23 238 lb 3.2 oz (108 kg)    VITALS: BP (!) 166/70 (BP Location: Left Arm, Patient Position: Sitting, Cuff Size: Normal)   Pulse 62   Ht 5' 6 (1.676 m)   Wt 240 lb 6.4 oz (109 kg)   SpO2 96%   BMI 38.80 kg/m   EXAM: General appearance: alert, no distress, and morbidly obese Neck: no carotid bruit, no JVD, and thyroid  not enlarged, symmetric, no tenderness/mass/nodules Lungs: wheezes  bilaterally Heart: S1, S2 normal and systolic murmur: systolic ejection 3/6, musical at 2nd right intercostal space Abdomen: soft, non-tender; bowel sounds normal; no masses,  no organomegaly Extremities: extremities normal, atraumatic, no cyanosis or edema Pulses: 2+ and symmetric Skin: Skin color, texture, turgor normal. No rashes or lesions Neurologic: Grossly normal Psych: Pleasant  EKG: N/A  ASSESSMENT: NSVT PAF - CHADSVASC score of 4 (history of prior ablation) Bradycardia - ventricular escape in the 30's Syncope x 3 HTN Abnormal EKG with left bundle branch / LAFB pattern Tobacco abuse Had CPAP Aortic stenosis History of GI bleed x 2 on Xarelto   PLAN: 1.   Mr. Chalker had more moderate aortic stenosis by gradient on his last echo although it sounds moderate to severe on exam.  He had previously had close to 40 mm gradient.  Does get some shortness of breath with exertion but thinks that this is related to being out of shape.  He has also been noncompliant with the CPAP.  I encouraged him to get back on that.  He has another echo pending in about 6 months.  He is followed closely by the structural heart clinic.  He denies any bleeding issues on Eliquis  and is followed by cardiac EP as well.  Follow-up with us  in 6 months.  Vinie KYM Maxcy, MD, [facility], FNLA, FACP  [place]  [facility]  Medical Director of the Advanced Lipid Disorders &  Cardiovascular Risk Reduction Clinic Diplomate of the American Board of Clinical Lipidology Attending Cardiologist  Direct Dial: [phone]  Fax: [phone]  Website:  [email or web address]   Vinie BROCKS Casi Westerfeld 11/13/2023, 10:14 AM

## 2023-11-28 ENCOUNTER — Telehealth: Payer: Self-pay | Admitting: *Deleted

## 2023-11-28 DIAGNOSIS — Z87891 Personal history of nicotine dependence: Secondary | ICD-10-CM

## 2023-11-28 DIAGNOSIS — R911 Solitary pulmonary nodule: Secondary | ICD-10-CM

## 2023-11-28 NOTE — Telephone Encounter (Signed)
 Spoke with patient's spouse (DPR) and advised of pt's recent lung screening CT results. New small nodule seen scan . Recommendation is to repeat CT in 6 month. She verbalized understanding and would like a call closer to that time to schedule. Results/ plans faxed to PCP. Order placed for 6 month nodule f/u CT.

## 2023-12-11 ENCOUNTER — Emergency Department (HOSPITAL_BASED_OUTPATIENT_CLINIC_OR_DEPARTMENT_OTHER): Admitting: Radiology

## 2023-12-11 ENCOUNTER — Emergency Department (HOSPITAL_BASED_OUTPATIENT_CLINIC_OR_DEPARTMENT_OTHER)
Admission: EM | Admit: 2023-12-11 | Discharge: 2023-12-11 | Disposition: A | Discharge status: Home / Self Care | Attending: Emergency Medicine | Admitting: Emergency Medicine

## 2023-12-11 ENCOUNTER — Other Ambulatory Visit: Payer: Self-pay

## 2023-12-11 DIAGNOSIS — S80212A Abrasion, left knee, initial encounter: Secondary | ICD-10-CM | POA: Diagnosis not present

## 2023-12-11 DIAGNOSIS — W450XXA Nail entering through skin, initial encounter: Secondary | ICD-10-CM | POA: Diagnosis not present

## 2023-12-11 DIAGNOSIS — S61411A Laceration without foreign body of right hand, initial encounter: Secondary | ICD-10-CM | POA: Insufficient documentation

## 2023-12-11 DIAGNOSIS — Z87891 Personal history of nicotine dependence: Secondary | ICD-10-CM | POA: Insufficient documentation

## 2023-12-11 DIAGNOSIS — W19XXXA Unspecified fall, initial encounter: Secondary | ICD-10-CM

## 2023-12-11 DIAGNOSIS — J449 Chronic obstructive pulmonary disease, unspecified: Secondary | ICD-10-CM | POA: Insufficient documentation

## 2023-12-11 DIAGNOSIS — S91332A Puncture wound without foreign body, left foot, initial encounter: Secondary | ICD-10-CM | POA: Diagnosis not present

## 2023-12-11 DIAGNOSIS — S80211A Abrasion, right knee, initial encounter: Secondary | ICD-10-CM | POA: Insufficient documentation

## 2023-12-11 DIAGNOSIS — S6991XA Unspecified injury of right wrist, hand and finger(s), initial encounter: Secondary | ICD-10-CM | POA: Diagnosis present

## 2023-12-11 DIAGNOSIS — Z7901 Long term (current) use of anticoagulants: Secondary | ICD-10-CM | POA: Insufficient documentation

## 2023-12-11 DIAGNOSIS — Z23 Encounter for immunization: Secondary | ICD-10-CM | POA: Diagnosis not present

## 2023-12-11 DIAGNOSIS — R600 Localized edema: Secondary | ICD-10-CM | POA: Diagnosis not present

## 2023-12-11 LAB — CBC WITH DIFFERENTIAL/PLATELET
Abs Immature Granulocytes: 0.05 K/uL (ref 0.00–0.07)
Basophils Absolute: 0.1 K/uL (ref 0.0–0.1)
Basophils Relative: 1 %
Eosinophils Absolute: 0.1 K/uL (ref 0.0–0.5)
Eosinophils Relative: 0 %
HCT: 43.8 % (ref 39.0–52.0)
Hemoglobin: 14.9 g/dL (ref 13.0–17.0)
Immature Granulocytes: 0 %
Lymphocytes Relative: 14 %
Lymphs Abs: 2.1 K/uL (ref 0.7–4.0)
MCH: 28.9 pg (ref 26.0–34.0)
MCHC: 34 g/dL (ref 30.0–36.0)
MCV: 84.9 fL (ref 80.0–100.0)
Monocytes Absolute: 1.6 K/uL — ABNORMAL HIGH (ref 0.1–1.0)
Monocytes Relative: 11 %
Neutro Abs: 11 K/uL — ABNORMAL HIGH (ref 1.7–7.7)
Neutrophils Relative %: 74 %
Platelets: 150 K/uL (ref 150–400)
RBC: 5.16 MIL/uL (ref 4.22–5.81)
RDW: 14.4 % (ref 11.5–15.5)
WBC: 14.8 K/uL — ABNORMAL HIGH (ref 4.0–10.5)
nRBC: 0 % (ref 0.0–0.2)

## 2023-12-11 LAB — BASIC METABOLIC PANEL WITH GFR
Anion gap: 13 (ref 5–15)
BUN: 11 mg/dL (ref 8–23)
CO2: 25 mmol/L (ref 22–32)
Calcium: 10.2 mg/dL (ref 8.9–10.3)
Chloride: 102 mmol/L (ref 98–111)
Creatinine, Ser: 1.15 mg/dL (ref 0.61–1.24)
GFR, Estimated: >60 mL/min (ref >60)
Glucose, Bld: 118 mg/dL — ABNORMAL HIGH (ref 70–99)
Potassium: 3.5 mmol/L (ref 3.5–5.1)
Sodium: 141 mmol/L (ref 135–145)

## 2023-12-11 MED ORDER — TETANUS-DIPHTH-ACELL PERTUSSIS 5-2.5-18.5 LF-MCG/0.5 IM SUSY
0.5000 mL | PREFILLED_SYRINGE | Freq: Once | INTRAMUSCULAR | Status: AC
  Administered 2023-12-11: 0.5 mL via INTRAMUSCULAR
  Filled 2023-12-11: qty 0.5

## 2023-12-11 MED ORDER — CIPROFLOXACIN HCL 500 MG PO TABS: 500.0000 mg | ORAL_TABLET | Freq: Two times a day (BID) | ORAL | 0 refills | Status: AC

## 2023-12-11 MED ORDER — CIPROFLOXACIN HCL 500 MG PO TABS
500.0000 mg | ORAL_TABLET | Freq: Once | ORAL | Status: AC
  Administered 2023-12-11: 500 mg via ORAL
  Filled 2023-12-11: qty 1

## 2023-12-11 NOTE — ED Triage Notes (Signed)
 States stepped on rusty nail on left foot. Reports falling to knee after stepping on knee. Lac to r hand and r knee. Takes eliquis . Denies hitting head.   Unsure of tetanus.

## 2023-12-11 NOTE — Discharge Instructions (Addendum)
 Take the antibiotic Cipro  as directed.  Would recommend soaking the foot in warm water for 20 minutes twice a day.  And make an appointment to follow-up with his doctor.  Return for any new or worse symptoms.  X-ray of the foot did not have any evidence of retained foreign body or any bony abnormalities.

## 2023-12-11 NOTE — ED Provider Notes (Addendum)
 [place] EMERGENCY DEPARTMENT AT [facility] Provider Note   CSN: [id number] Arrival date & time: 12/11/23  1149     Patient presents with: Foot Injury   Derrick Alexander is a 78 y.o. male.   Patient yesterday stepped on a rusty nail on a deck.  Went into his left foot.  They do not think the nail broke off.  Not certain if tetanus is up-to-date.  Stepping on that made him fall.  Received a skin tear to his right hand and an abrasion to his right knee.  They treated that at home.  Came in today because there was increased pain in the left foot and some redness and swelling.  Patient is prediabetic also has a history of atrial fibrillation and is on Eliquis  for that.  History of left bundle branch block COPD diastolic dysfunction.  Right bundle branch block.  Patient is a former smoker quit in 2019.  Patient was in old crocs when the nail went through.       Prior to Admission medications   Medication Sig Start Date End Date Taking? Authorizing Provider  acetaminophen  (TYLENOL ) 325 MG tablet Take 2 tablets (650 mg total) by mouth every 6 (six) hours as needed for mild pain (or Fever >/= 101). 07/12/22   Elgergawy, Brayton RAMAN, MD  albuterol  (PROVENTIL ) (2.5 MG/3ML) 0.083% nebulizer solution Take 3 mLs (2.5 mg total) by nebulization every 4 (four) hours as needed for wheezing or shortness of breath. 04/30/22   Antoinette Doe, MD  albuterol  (VENTOLIN  HFA) 108 (90 Base) MCG/ACT inhaler Inhale 2 puffs into the lungs every 6 (six) hours as needed for wheezing or shortness of breath. 08/18/21   Kassie Acquanetta Bradley, MD  cetirizine (ZYRTEC) 10 MG tablet Take 10 mg by mouth daily.    [provider]  ELIQUIS  5 MG TABS tablet TAKE 1 TABLET(5 MG) BY MOUTH TWICE DAILY 08/01/23   Hilty, Vinie BROCKS, MD  fluticasone -salmeterol (WIXELA INHUB) 250-50 MCG/ACT AEPB Inhale 1 puff into the lungs in the morning and at bedtime. 05/11/23   Kassie Acquanetta Bradley, MD  furosemide  (LASIX ) 40 MG tablet TAKE 1  AND 1/2 TABLETS(60 MG) BY MOUTH TWICE DAILY 06/19/23   Hilty, Vinie BROCKS, MD  latanoprost  (XALATAN ) 0.005 % ophthalmic solution Place 1 drop into both eyes at bedtime.    [provider]  methimazole (TAPAZOLE) 5 MG tablet Take 5 mg by mouth daily. 08/19/22   [provider]  metoprolol  tartrate (LOPRESSOR ) 25 MG tablet Take 0.5 tablets (12.5 mg total) by mouth 2 (two) times daily. 08/02/23   Hilty, Vinie BROCKS, MD  Polyethyl Glycol-Propyl Glycol (SYSTANE OP) Place 1 drop into both eyes daily as needed (dry eyes).    [provider]  rosuvastatin  (CRESTOR ) 20 MG tablet Take 20 mg by mouth daily. 12/12/18   [provider]    Allergies: Cardizem  [diltiazem ], Pacerone  [amiodarone ], and Tambocor  [flecainide ]    Review of Systems  Constitutional:  Negative for chills and fever.  HENT:  Negative for ear pain and sore throat.   Eyes:  Negative for pain and visual disturbance.  Respiratory:  Negative for cough and shortness of breath.   Cardiovascular:  Negative for chest pain and palpitations.  Gastrointestinal:  Negative for abdominal pain and vomiting.  Genitourinary:  Negative for dysuria and hematuria.  Musculoskeletal:  Negative for arthralgias and back pain.  Skin:  Positive for wound. Negative for color change and rash.  Neurological:  Negative for seizures and syncope.  Hematological:  Bruises/bleeds easily.  All other systems reviewed and are negative.   Updated Vital Signs BP (!) 135/98 (BP Location: Right Arm)   Pulse 71   Temp 98.9 F (37.2 C) (Oral)   Resp 18   SpO2 98%   Physical Exam Vitals and nursing note reviewed.  Constitutional:      General: He is not in acute distress.    Appearance: Normal appearance. He is well-developed. He is not ill-appearing.  HENT:     Head: Normocephalic and atraumatic.  Eyes:     Conjunctiva/sclera: Conjunctivae normal.  Cardiovascular:     Rate and Rhythm: Normal rate and regular rhythm.     Heart  sounds: No murmur heard. Pulmonary:     Effort: Pulmonary effort is normal. No respiratory distress.     Breath sounds: Normal breath sounds.  Abdominal:     Palpations: Abdomen is soft.     Tenderness: There is no abdominal tenderness.  Musculoskeletal:        General: Swelling, tenderness and signs of injury present.     Cervical back: Neck supple.     Right lower leg: No edema.     Left lower leg: No edema.     Comments: Puncture wound to the left bottom of the forefoot.  Erythema there is some erythema on the dorsal side of the foot as well.  With some increased warmth.  No streaking.  Some tenderness to palpation on the bottom of the foot as well.  Also a skin tear of the right hand between the little finger and ring finger.  That measures about 1 cm.  And there is an abrasion to the right knee that also measures about 1 cm.  Not a laceration.  Skin:    General: Skin is warm and dry.     Capillary Refill: Capillary refill takes less than 2 seconds.  Neurological:     General: No focal deficit present.     Mental Status: He is alert and oriented to person, place, and time.  Psychiatric:        Mood and Affect: Mood normal.     (all labs ordered are listed, but only abnormal results are displayed) Labs Reviewed  CBC WITH DIFFERENTIAL/PLATELET  BASIC METABOLIC PANEL WITH GFR    EKG: None  Radiology: No results found.   Procedures   Medications Ordered in the ED  Tdap (BOOSTRIX ) injection 0.5 mL (has no administration in time range)                                    Medical Decision Making Amount and/or Complexity of Data Reviewed Labs: ordered. Radiology: ordered.  Risk Prescription drug management.   Puncture wound to the bottom of the left foot.  Also skin tear to the right hand and abrasion to the right knee.  With x-ray of the left foot.  They did bring in the nail which appears to be a roofing nail is quite rusty.  Will update his tetanus.  Will  check CBC basic metabolic panel.  If x-ray does not show any evidence of persistent foreign body will start on antibiotics Augmentin probably the most appropriate.  But since patient did have old crocs on may need to worry about other potential causes may need to be treated with Cipro .  CBC white count 14.8 hemoglobin 14.9.  Platelets 150.  Basic metabolic panel blood sugar  reasonable at 118 electrolytes normal renal function normal.  X-ray of the left foot no retained foreign body.  No evidence of any bony injury.  Based on this we will treat patient with the antibiotic Cipro .     Final diagnoses:  Puncture wound of left foot, initial encounter  Skin tear of right hand without complication, initial encounter  Abrasion of right knee, initial encounter  Fall, initial encounter    ED Discharge Orders     None          Geraldene Hamilton, MD 12/11/23 1321    Geraldene Hamilton, MD 12/11/23 [phone]

## 2023-12-12 DIAGNOSIS — S91331A Puncture wound without foreign body, right foot, initial encounter: Secondary | ICD-10-CM | POA: Diagnosis not present

## 2023-12-25 DIAGNOSIS — E0590 Thyrotoxicosis, unspecified without thyrotoxic crisis or storm: Secondary | ICD-10-CM | POA: Diagnosis not present

## 2023-12-27 ENCOUNTER — Encounter (HOSPITAL_BASED_OUTPATIENT_CLINIC_OR_DEPARTMENT_OTHER): Payer: Self-pay | Admitting: Pulmonary Disease

## 2024-01-01 ENCOUNTER — Telehealth (HOSPITAL_BASED_OUTPATIENT_CLINIC_OR_DEPARTMENT_OTHER): Payer: Self-pay | Admitting: Pulmonary Disease

## 2024-01-01 DIAGNOSIS — E042 Nontoxic multinodular goiter: Secondary | ICD-10-CM | POA: Diagnosis not present

## 2024-01-01 DIAGNOSIS — E0590 Thyrotoxicosis, unspecified without thyrotoxic crisis or storm: Secondary | ICD-10-CM | POA: Diagnosis not present

## 2024-01-01 NOTE — Telephone Encounter (Signed)
 Copied from CRM [id number]. Topic: Clinical - Order For Equipment >> Jan 01, 2024  3:26 PM Corean SAUNDERS wrote: Reason for CRM: Patients wife Tommi) is requesting a new CPAP prescription be faxed to [facility] as the patients insurance has switched to [place] for DME. Cathlean states [facility] advised her they would fax the request to [facility] but it has not been received.

## 2024-01-01 NOTE — Telephone Encounter (Signed)
 Please advise as LOV says OSA managed by PCP

## 2024-01-01 NOTE — Telephone Encounter (Signed)
 Insurance switched from [place] to [place] for DME.  Patient previously reported that PCP managing his OSA. However has not been recently followed. Will need face to face to order repeat home sleep study.  Updated patient and wife. Scheduled in September 2025.

## 2024-01-07 DIAGNOSIS — E782 Mixed hyperlipidemia: Secondary | ICD-10-CM | POA: Diagnosis not present

## 2024-01-07 DIAGNOSIS — R7303 Prediabetes: Secondary | ICD-10-CM | POA: Diagnosis not present

## 2024-01-07 DIAGNOSIS — I10 Essential (primary) hypertension: Secondary | ICD-10-CM | POA: Diagnosis not present

## 2024-01-11 DIAGNOSIS — I350 Nonrheumatic aortic (valve) stenosis: Secondary | ICD-10-CM | POA: Diagnosis not present

## 2024-01-11 DIAGNOSIS — R600 Localized edema: Secondary | ICD-10-CM | POA: Diagnosis not present

## 2024-01-11 DIAGNOSIS — I10 Essential (primary) hypertension: Secondary | ICD-10-CM | POA: Diagnosis not present

## 2024-01-11 DIAGNOSIS — D6869 Other thrombophilia: Secondary | ICD-10-CM | POA: Diagnosis not present

## 2024-01-11 DIAGNOSIS — I5032 Chronic diastolic (congestive) heart failure: Secondary | ICD-10-CM | POA: Diagnosis not present

## 2024-01-11 DIAGNOSIS — E782 Mixed hyperlipidemia: Secondary | ICD-10-CM | POA: Diagnosis not present

## 2024-01-11 DIAGNOSIS — R3129 Other microscopic hematuria: Secondary | ICD-10-CM | POA: Diagnosis not present

## 2024-01-11 DIAGNOSIS — I480 Paroxysmal atrial fibrillation: Secondary | ICD-10-CM | POA: Diagnosis not present

## 2024-01-11 DIAGNOSIS — J449 Chronic obstructive pulmonary disease, unspecified: Secondary | ICD-10-CM | POA: Diagnosis not present

## 2024-01-11 DIAGNOSIS — R7303 Prediabetes: Secondary | ICD-10-CM | POA: Diagnosis not present

## 2024-01-11 DIAGNOSIS — Z0000 Encounter for general adult medical examination without abnormal findings: Secondary | ICD-10-CM | POA: Diagnosis not present

## 2024-01-28 ENCOUNTER — Other Ambulatory Visit: Payer: Self-pay | Admitting: Internal Medicine

## 2024-01-28 DIAGNOSIS — I4819 Other persistent atrial fibrillation: Secondary | ICD-10-CM

## 2024-01-29 NOTE — Telephone Encounter (Signed)
 Prescription refill request for Eliquis  received. Indication: a fib Last office visit: 11/13/23 Scr: 1.15 epic 12/11/23 Age: 78 Weight: 109kg

## 2024-02-04 ENCOUNTER — Encounter (HOSPITAL_BASED_OUTPATIENT_CLINIC_OR_DEPARTMENT_OTHER): Payer: Self-pay | Admitting: Pulmonary Disease

## 2024-02-04 ENCOUNTER — Ambulatory Visit (HOSPITAL_BASED_OUTPATIENT_CLINIC_OR_DEPARTMENT_OTHER): Admitting: Pulmonary Disease

## 2024-02-04 VITALS — BP 142/69 | HR 69 | Ht 66.0 in | Wt 238.0 lb

## 2024-02-04 DIAGNOSIS — J449 Chronic obstructive pulmonary disease, unspecified: Secondary | ICD-10-CM | POA: Diagnosis not present

## 2024-02-04 DIAGNOSIS — G4733 Obstructive sleep apnea (adult) (pediatric): Secondary | ICD-10-CM

## 2024-02-04 NOTE — Progress Notes (Addendum)
 Subjective:   PATIENT ID: Derrick Alexander DOB: 1945-09-08, MRN: [id number]   HPI  Chief Complaint  Patient presents with   Sleep Apnea   COPD    Reason for Visit: Follow-up for shortness of breath  [name] is a 78 year old Alexander former smoker with COPD, OSA, atrial fibrillation, history of VT, hypertension who presents for follow-up for COPD  Initial consult He was previously seen by Dr. Darlean Alexander pulmonary on 08/01/2017.  He has been lost to follow-up since then. He reports worsening shortness of breath in the last six months. He reports difficulty walking and carrying things without feeling breathless. Has to take breaks when walking down the driveway. Walks slowly up the stairs. Shortness of breath is associated with wheezing with exertion. Minimal cough. Does wheeze at night. Wears CPAP on average 5 hours at night.  He is currently not on inhalers. He reports weight gain 20 lb in the last 6 months. Not active at baseline.  11/15/2021 Since our last visit he has been compliant with Spiriva . However not sure if he feels a difference. Sometimes standing for long periods of time including in the shower, he has difficulty and will need rest. He does walk a few minutes to the shed. Denies wheezing or coughing.  02/14/22 Since our last visit, he has been off Stiolto for two weeks. He is not planning to buy any meds due to cost. He is hoping that he doesn't need to go to the hospital by the end of the year. When he was on it he had improved shortness of breath and improved wheezing. Now his wife reports he is noticeably more short of breath and not able to walk as far. He is compliant with his CPAP. He does not know if his quality of sleep is improved but has noticed that he is not requiring as many naps during the day and no longer falling asleep while talking.   05/09/22 Since our last visit he was recently hospitalized last week for GI bleed secondary to multiple AVMs  treated with APC. Since then his cough has worsened. Was not treated for COPD exacerbation while inpatient and had no O2 needs. No fevers or chills. Swallows ok. Associated with some wheezing and shortness of breath that worsens at night. Not active at baseline but has not been for the majority of this year. Wife present and agrees that has had minimal activity.  08/08/22 Since our last visit he has been compliant with Advair and no further leg tremors. Denies cough or wheezing. Shortness of breath with exertion due to atrial fibrillation and lack of activity since being discharged from hospital. He was hospitalized from 07/06/22-07/12/22 for GI bleeding with erythematous gastric mucosa and colonoscopy with multiple ulcers in ascending colon and cecum. He is able to walk to mailbox now. Compliant with CPAP nightly.  05/11/23 Since our last visit he has been compliant with Wixela. Rarely uses albuterol . Breathing has been labored but has had issues with afib recently. Denies exacerbations since our last visit. Denies cough or wheezing. Compliant with CPAP nightly. Has recently switched from [employer] to [place]. Planning to start [facility] next year.  02/04/24 Since our last visit he is overall doing well. Has not been consistently taking Wixela but not noticing a difference. No exacerbations in the last 9 months. He is more concerned about his uncontrolled sleep apnea with witnessed apnea by his wife and gasping at night. He is sleepy during the day. Has  not been able to use his CPAP due to lack of supplies for the last year.  Social History: Former smoker. Quit in 2017. >99 pack years. Previously 3 packs daily Previously broke his back as a teenager on trampoline  Past Medical History:  Diagnosis Date   A-fib (HCC)    Abnormal EKG 06/18/2015   Aortic atherosclerosis (HCC)    AV malformation of GI tract    Bradycardia    Carotid artery disease (HCC)    COPD (chronic obstructive pulmonary disease) (HCC)     COPD with exacerbation (HCC)    Diastolic dysfunction    Faintness 06/18/2015   GIB (gastrointestinal bleeding)    Hepatic steatosis    by imaging   Hypertension    LBBB (left bundle branch block) 06/18/2015   Moderate aortic stenosis    NSVT (nonsustained ventricular tachycardia) (HCC)    Obesity    OSA on CPAP    PVC's (premature ventricular contractions)    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 (LAFB)    Syncope 08/06/2015   Tobacco use    V-tach (HCC) 08/06/2015     Family History  Problem Relation Age of Onset   Diabetes Mother    Cancer Father    Heart disease Brother    COPD Brother      Social History   Occupational History   Occupation: retired   Occupation: Psychologist, forensic  Tobacco Use   Smoking status: Former    Current packs/day: 0.00    Average packs/day: 2.0 packs/day for 60.0 years (120.0 ttl pk-yrs)    Types: Cigarettes    Start date: 06/13/1957    Quit date: 06/13/2017    Years since quitting: 6.6   Smokeless tobacco: Never  Vaping Use   Vaping status: Never Used  Substance and Sexual Activity   Alcohol  use: No    Alcohol /week: 0.0 standard drinks of alcohol    Drug use: No   Sexual activity: Not on file    Allergies  Allergen Reactions   Cardizem  [Diltiazem ] Shortness Of Breath   Pacerone  [Amiodarone ] Shortness Of Breath, Nausea Only and Other (See Comments)    Abdominal pain Dizziness   Tambocor  [Flecainide ] Shortness Of Breath     Outpatient Medications Prior to Visit  Medication Sig Dispense Refill   acetaminophen  (TYLENOL ) 325 MG tablet Take 2 tablets (650 mg total) by mouth every 6 (six) hours as needed for mild pain (or Fever >/= 101).     albuterol  (PROVENTIL ) (2.5 MG/3ML) 0.083% nebulizer solution Take 3 mLs (2.5 mg total) by nebulization every 4 (four) hours as needed for wheezing or shortness of breath. 75 mL 12   albuterol  (VENTOLIN  HFA) 108 (90 Base) MCG/ACT inhaler Inhale 2 puffs into  the lungs every 6 (six) hours as needed for wheezing or shortness of breath. 8 g 6   cetirizine (ZYRTEC) 10 MG tablet Take 10 mg by mouth daily.     ELIQUIS  5 MG TABS tablet TAKE 1 TABLET(5 MG) BY MOUTH TWICE DAILY. 60 tablet 5   fluticasone -salmeterol (WIXELA INHUB) 250-50 MCG/ACT AEPB Inhale 1 puff into the lungs in the morning and at bedtime. 60 each 11   furosemide  (LASIX ) 40 MG tablet TAKE 1 AND 1/2 TABLETS(60 MG) BY MOUTH TWICE DAILY 270 tablet 2   latanoprost  (XALATAN ) 0.005 % ophthalmic solution Place 1 drop into both eyes at bedtime.     methimazole (TAPAZOLE) 5 MG tablet Take 5 mg by mouth daily.  metoprolol  tartrate (LOPRESSOR ) 25 MG tablet Take 0.5 tablets (12.5 mg total) by mouth 2 (two) times daily. 90 tablet 2   Polyethyl Glycol-Propyl Glycol (SYSTANE OP) Place 1 drop into both eyes daily as needed (dry eyes).     rosuvastatin  (CRESTOR ) 20 MG tablet Take 20 mg by mouth daily.     tamsulosin (FLOMAX) 0.4 MG CAPS capsule 1 capsule.     No facility-administered medications prior to visit.    Review of Systems  Constitutional:  Positive for malaise/fatigue. Negative for chills, diaphoresis, fever and weight loss.  HENT:  Negative for congestion.   Respiratory:  Negative for cough, hemoptysis, sputum production, shortness of breath and wheezing.   Cardiovascular:  Negative for chest pain, palpitations and leg swelling.     Objective:   Vitals:   02/04/24 1002  BP: (!) 142/69  Pulse: 69  SpO2: 96%  Weight: 238 lb (108 kg)  Height: 5' 6 (1.676 m)   SpO2: 96 %  Physical Exam: General: Well-appearing, no acute distress HENT: Derrick Alexander, AT Eyes: EOMI, no scleral icterus Respiratory: Clear to auscultation bilaterally.  No crackles, wheezing or rales Cardiovascular: RRR, -M/R/G, no JVD Extremities:-Edema,-tenderness Neuro: AAO x4, CNII-XII grossly intact Psych: Normal mood, normal affect  Data Reviewed:  Imaging: CXR 06/19/2017 mild cardiomegaly.  No infiltrate,  effusion or edema. CT cardiac 09/10/2020-CAC score 361 which is 50th percentile.  Visualized lung parenchyma normal, no evidence pulmonary nodules/masses. CXR 04/29/22 - No acute issues CXR 07/06/22 - Normal CT lung screen 10/04/22 - Mild centrilobular emphysema. Subcentimeter nodules CT lung screen 11/09/23 - Mild centrilobular and paraseptal emphysema, upper lobe predominant, scattered subcentimeter nodules stable in right lung including 15 mm ground glass nodule. New 4.1 subpleural nodule in lingula  PFT: 08/01/2017 FVC 2.38 (74%) FEV1 1.73 (75%) ratio 71 TLC 103% RV 150% RV/TLC 147% DLCO 101% Interpretation mild obstructive lung disease with air trapping present normal gas exchange.  No significant bronchodilator response however does not preclude benefit of therapy.  Labs: CBC    Component Value Date/Time   WBC 14.8 (H) 12/11/2023 1328   RBC 5.16 12/11/2023 1328   HGB 14.9 12/11/2023 1328   HGB 7.8 (L) 05/05/2022 0956   HCT 43.8 12/11/2023 1328   HCT 23.7 (L) 05/05/2022 0956   PLT 150 12/11/2023 1328   PLT 212 05/05/2022 0956   MCV 84.9 12/11/2023 1328   MCV 88 05/05/2022 0956   MCH 28.9 12/11/2023 1328   MCHC 34.0 12/11/2023 1328   RDW 14.4 12/11/2023 1328   RDW 14.9 05/05/2022 0956   LYMPHSABS 2.1 12/11/2023 1328   MONOABS 1.6 (H) 12/11/2023 1328   EOSABS 0.1 12/11/2023 1328   BASOSABS 0.1 12/11/2023 1328   Absolute eos  08/01/2017-100 04/27/22 -100  CPAP Compliance 01/04/24-02/02/24 Usage days 6/30 days (20%) >4 hours 3 days (10%) Avg use 5 hours 23 min Auto CPAP 5-20 cm AHI 9.4    Assessment & Plan:   Discussion: 78 year old Alexander former smoker,  with COPD, atrial fibrillation, hx VT, HTN who presents for follow-up for OSA. Discussed OSA history and recommendations below. COPD currently well controlled off maintenance inhalers. Discussed clinical course and management of COPD including bronchodilator regimen, preventive care including vaccinations and action plan for  exacerbation.  Prior inhalers: Stiolto - Dc'd due to cost Wixela - previously tolerated  COPD - controlled on SABA --CONTINUE Albuterol  AS NEEDED for shortness of breath or wheezing. OK to use prior to exercise --Encourage regular aerobic exercise up to 20  minutes daily   OSA - changing from PCP to Pulm: Dr. Kassie The natural history, progression and prognosis of sleep apnea, treatment with PAP and alternative treatment strategies were discussed. The patient was also educated regarding the long term cardiovascular benefits of treating sleep apnea, including improved blood pressure control, reduction in MI and stroke risk as well as other potential benefits of treatment, such as improved glycemic control, facilitation of weight loss, improved energy during the day and improved sleep quality. --Sleep study 07/05/17 per patient. Will obtain sleep study report.  ADDENDUM: Reviewed fax. HST 8/3/0/0. AHI 15.9 consistent with moderate OSA --Not compliant in the last 30 days due to lack of supplies for the last year --Counseled on sleep hygiene --Counseled on weight loss/maintenance of healthy weight --Counseled NOT to drive if/when sleepy --Advised patient to wear CPAP for at least 4 hours each night for greater than 70% of the time to avoid the machine being repossessed by insurance.  Subcentimeter nodules New lingula nodule 4.1 mm --Continue to monitor on cancer lung screen  Health Maintenance Immunization History  Administered Date(s) Administered   INFLUENZA, HIGH DOSE SEASONAL PF 06/14/2017   Influenza, Quadrivalent, Recombinant, Inj, Pf 06/14/2017, 01/29/2019   Pneumococcal Conjugate-13 12/12/2018   Pneumococcal Polysaccharide-23 06/14/2017   Tdap 11/06/2017, 12/11/2023   CT Lung Screen - not qualified. Due to age  No orders of the defined types were placed in this encounter.  No orders of the defined types were placed in this encounter.   Return in about 4 months (around  06/05/2024).  I have spent a total time of 35-minutes on the day of the appointment including chart review, data review, collecting history, coordinating care and discussing medical diagnosis and plan with the patient/family. Past medical history, allergies, medications were reviewed. Pertinent imaging, labs and tests included in this note have been reviewed and interpreted independently by me.  Derrick Groseclose Slater Kassie, MD [facility]

## 2024-02-04 NOTE — Patient Instructions (Signed)
 Please call us  in 2 weeks if you have not received any information about sleep supplies or sleep study

## 2024-02-05 ENCOUNTER — Telehealth (HOSPITAL_BASED_OUTPATIENT_CLINIC_OR_DEPARTMENT_OTHER): Payer: Self-pay

## 2024-02-05 NOTE — Telephone Encounter (Signed)
 CMN received for CPAP supplies signed by provider and faxed confirmation received [facility]

## 2024-03-15 ENCOUNTER — Other Ambulatory Visit: Payer: Self-pay | Admitting: Internal Medicine

## 2024-03-18 ENCOUNTER — Encounter: Payer: Self-pay | Admitting: Acute Care

## 2024-04-07 ENCOUNTER — Ambulatory Visit (HOSPITAL_COMMUNITY)
Admission: RE | Admit: 2024-04-07 | Discharge: 2024-04-07 | Disposition: A | Discharge status: Home / Self Care | Source: Ambulatory Visit | Attending: Cardiology | Admitting: Cardiology

## 2024-04-07 DIAGNOSIS — I350 Nonrheumatic aortic (valve) stenosis: Secondary | ICD-10-CM | POA: Insufficient documentation

## 2024-04-07 LAB — ECHOCARDIOGRAM COMPLETE
AR max vel: 0.74 cm2
AV Area VTI: 0.65 cm2
AV Area mean vel: 0.72 cm2
AV Mean grad: 40 mmHg
AV Peak grad: 52.9 mmHg
Ao pk vel: 3.64 m/s
Est EF: 55
P 1/2 time: 437 ms
S' Lateral: 4.2 cm

## 2024-04-07 MED ORDER — PERFLUTREN LIPID MICROSPHERE
1.0000 mL | INTRAVENOUS | Status: AC | PRN
  Administered 2024-04-07: 2 mL via INTRAVENOUS

## 2024-04-08 ENCOUNTER — Ambulatory Visit: Payer: Self-pay | Admitting: Cardiovascular Disease

## 2024-04-24 ENCOUNTER — Other Ambulatory Visit: Payer: Self-pay | Admitting: Internal Medicine

## 2024-04-24 DIAGNOSIS — E042 Nontoxic multinodular goiter: Secondary | ICD-10-CM

## 2024-04-28 ENCOUNTER — Encounter: Payer: Self-pay | Admitting: Urology

## 2024-04-28 ENCOUNTER — Other Ambulatory Visit: Payer: Self-pay | Admitting: Urology

## 2024-04-28 DIAGNOSIS — N403 Nodular prostate with lower urinary tract symptoms: Secondary | ICD-10-CM

## 2024-04-28 DIAGNOSIS — N3941 Urge incontinence: Secondary | ICD-10-CM

## 2024-04-28 DIAGNOSIS — R3912 Poor urinary stream: Secondary | ICD-10-CM

## 2024-04-28 DIAGNOSIS — R3914 Feeling of incomplete bladder emptying: Secondary | ICD-10-CM

## 2024-04-28 DIAGNOSIS — R3915 Urgency of urination: Secondary | ICD-10-CM

## 2024-04-28 DIAGNOSIS — R351 Nocturia: Secondary | ICD-10-CM

## 2024-04-28 DIAGNOSIS — R350 Frequency of micturition: Secondary | ICD-10-CM

## 2024-04-28 DIAGNOSIS — R3121 Asymptomatic microscopic hematuria: Secondary | ICD-10-CM

## 2024-04-28 NOTE — Progress Notes (Unsigned)
 [facility] Note  No chief complaint on file.  History of Present Illness: 78 yo male with history of VT, syncope, obesity, tobacco abuse, atrial fibrillation, COPD, HTN, LBBB, RBBB, sleep apnea and aortic stenosis who is here today for follow up. I saw him as a new consult in January 2025 to discuss his aortic stenosis and possible TAVR. He has persistent atrial fibrillation. He has undergone atrial fib ablation and is on Eliquis . He is not known to have obstructive CAD. Abnormal coronary calcium  score by CT in April 2022 but no previous cardiac cath. He quit smoking in 2019. He has COPD and uses Advair daily. He has been followed for moderate aortic stenosis by Dr. Mona. Echo 05/22/23 with normal LV function and moderate to severe aortic stenosis. He was asymptomatic at his first visit here in January 2025. Echo June 2025 with LVEF=60-65%. Moderate aortic stenosis with mean gradient 26 mmHg, AVA 1.07 cm2, DI  0.35, SVI 42. When I saw him in June 2025 he was feeling well with no symptoms except for mild dyspnea which was at his baseline. Echo 04/07/24 with LVEF=55%. Severe aortic stenosis with mean gradient 40 mmHg, AVA 0.65 cm2, SVI 23, DI 0.21.   He is here today for follow up. The patient denies any chest pain, dyspnea, palpitations, lower extremity edema, orthopnea, PND, dizziness, near syncope or syncope.   He lives in [place]. He is a retired naval architect. He has full dentures.   Primary Care Physician: Clarice Nottingham, MD Primary Cardiologist: [facility] Referring Cardiologist: Mona  Past Medical History:  Diagnosis Date   A-fib [facility])    Abnormal EKG 06/18/2015   Aortic atherosclerosis    AV malformation of GI tract    Bradycardia    Carotid artery disease    COPD (chronic obstructive pulmonary disease) (HCC)    COPD with exacerbation (HCC)    Diastolic dysfunction    Faintness 06/18/2015   GIB (gastrointestinal bleeding)    Hepatic steatosis    by imaging    Hypertension    LBBB (left bundle branch block) 06/18/2015   Moderate aortic stenosis    NSVT (nonsustained ventricular tachycardia) (HCC)    Obesity    OSA on CPAP    PVC's (premature ventricular contractions)    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 (LAFB)    Syncope 08/06/2015   Tobacco use    V-tach (HCC) 08/06/2015    Past Surgical History:  Procedure Laterality Date   ATRIAL FIBRILLATION ABLATION N/A 09/17/2020   Procedure: ATRIAL FIBRILLATION ABLATION;  Surgeon: Inocencio Soyla Lunger, MD;  Location: [facility] INVASIVE CV LAB;  Service: Cardiovascular;  Laterality: N/A;   BIOPSY  07/10/2022   Procedure: BIOPSY;  Surgeon: Rollin Dover, MD;  Location: [facility] ENDOSCOPY;  Service: Gastroenterology;;   COLONOSCOPY WITH PROPOFOL  N/A 03/07/2019   Procedure: COLONOSCOPY WITH PROPOFOL ;  Surgeon: Rollin Dover, MD;  Location: [facility] ENDOSCOPY;  Service: Endoscopy;  Laterality: N/A;   COLONOSCOPY WITH PROPOFOL  N/A 07/10/2022   Procedure: COLONOSCOPY WITH PROPOFOL ;  Surgeon: Rollin Dover, MD;  Location: [facility] ENDOSCOPY;  Service: Gastroenterology;  Laterality: N/A;   ESOPHAGOGASTRODUODENOSCOPY (EGD) WITH PROPOFOL  N/A 04/28/2022   Procedure: ESOPHAGOGASTRODUODENOSCOPY (EGD) WITH PROPOFOL ;  Surgeon: Rollin Dover, MD;  Location: [facility] ENDOSCOPY;  Service: Gastroenterology;  Laterality: N/A;   ESOPHAGOGASTRODUODENOSCOPY (EGD) WITH PROPOFOL  N/A 07/07/2022   Procedure: ESOPHAGOGASTRODUODENOSCOPY (EGD) WITH PROPOFOL ;  Surgeon: Rollin Dover, MD;  Location: [facility] ENDOSCOPY;  Service: Gastroenterology;  Laterality: N/A;   HOT HEMOSTASIS  N/A 04/28/2022   Procedure: HOT HEMOSTASIS (ARGON PLASMA COAGULATION/BICAP);  Surgeon: Rollin Dover, MD;  Location: [facility];  Service: Gastroenterology;  Laterality: N/A;   HOT HEMOSTASIS N/A 07/07/2022   Procedure: HOT HEMOSTASIS (ARGON PLASMA COAGULATION/BICAP);  Surgeon: Rollin Dover, MD;  Location: [facility] ENDOSCOPY;  Service: Gastroenterology;  Laterality:  N/A;   POLYPECTOMY  03/07/2019   Procedure: POLYPECTOMY;  Surgeon: Rollin Dover, MD;  Location: [facility] ENDOSCOPY;  Service: Endoscopy;;   POLYPECTOMY  07/10/2022   Procedure: POLYPECTOMY;  Surgeon: Rollin Dover, MD;  Location: [facility] ENDOSCOPY;  Service: Gastroenterology;;    Current Outpatient Medications  Medication Sig Dispense Refill   acetaminophen  (TYLENOL ) 325 MG tablet Take 2 tablets (650 mg total) by mouth every 6 (six) hours as needed for mild pain (or Fever >/= 101).     albuterol  (PROVENTIL ) (2.5 MG/3ML) 0.083% nebulizer solution Take 3 mLs (2.5 mg total) by nebulization every 4 (four) hours as needed for wheezing or shortness of breath. 75 mL 12   albuterol  (VENTOLIN  HFA) 108 (90 Base) MCG/ACT inhaler Inhale 2 puffs into the lungs every 6 (six) hours as needed for wheezing or shortness of breath. 8 g 6   cetirizine (ZYRTEC) 10 MG tablet Take 10 mg by mouth daily.     ELIQUIS  5 MG TABS tablet TAKE 1 TABLET(5 MG) BY MOUTH TWICE DAILY. 60 tablet 5   fluticasone -salmeterol (WIXELA INHUB) 250-50 MCG/ACT AEPB Inhale 1 puff into the lungs in the morning and at bedtime. 60 each 11   furosemide  (LASIX ) 40 MG tablet TAKE 1 AND 1/2 TABLETS(60 MG) BY MOUTH TWICE DAILY 270 tablet 2   latanoprost  (XALATAN ) 0.005 % ophthalmic solution Place 1 drop into both eyes at bedtime.     methimazole (TAPAZOLE) 5 MG tablet Take 5 mg by mouth daily.     metoprolol  tartrate (LOPRESSOR ) 25 MG tablet Take 0.5 tablets (12.5 mg total) by mouth 2 (two) times daily. 90 tablet 2   Polyethyl Glycol-Propyl Glycol (SYSTANE OP) Place 1 drop into both eyes daily as needed (dry eyes).     rosuvastatin  (CRESTOR ) 20 MG tablet Take 20 mg by mouth daily.     tamsulosin (FLOMAX) 0.4 MG CAPS capsule 1 capsule.     No current facility-administered medications for this visit.    Allergies  Allergen Reactions   Cardizem  [Diltiazem ] Shortness Of Breath   Pacerone  [Amiodarone ] Shortness Of Breath, Nausea Only and Other (See  Comments)    Abdominal pain Dizziness   Tambocor  [Flecainide ] Shortness Of Breath    Social History   Socioeconomic History   Marital status: Married    Spouse name: Not on file   Number of children: 3   Years of education: Not on file   Highest education level: Not on file  Occupational History   Occupation: retired   Occupation: Psychologist, forensic  Tobacco Use   Smoking status: Former    Current packs/day: 0.00    Average packs/day: 2.0 packs/day for 60.0 years (120.0 ttl pk-yrs)    Types: Cigarettes    Start date: 06/13/1957    Quit date: 06/13/2017    Years since quitting: 6.8   Smokeless tobacco: Never  Vaping Use   Vaping status: Never Used  Substance and Sexual Activity   Alcohol  use: No    Alcohol /week: 0.0 standard drinks of alcohol    Drug use: No   Sexual activity: Not on file  Other Topics Concern   Not on file  Social History Narrative   Not on file  Social Drivers of Corporate Investment Banker Strain: Not on file  Food Insecurity: No Food Insecurity (04/28/2022)   Hunger Vital Sign    Worried About Running Out of Food in the Last Year: Never true    Ran Out of Food in the Last Year: Never true  Transportation Needs: No Transportation Needs (04/28/2022)   PRAPARE - Administrator, Civil Service (Medical): No    Lack of Transportation (Non-Medical): No  Physical Activity: Not on file  Stress: Not on file  Social Connections: Not on file  Intimate Partner Violence: Not At Risk (04/28/2022)   Humiliation, Afraid, Rape, and Kick questionnaire    Fear of Current or Ex-Partner: No    Emotionally Abused: No    Physically Abused: No    Sexually Abused: No    Family History  Problem Relation Age of Onset   Diabetes Mother    Cancer Father    Heart disease Brother    COPD Brother     Review of Systems:  As stated in the HPI and otherwise negative.   There were no vitals taken for this visit.  Physical Examination: General: Well  developed, well nourished, NAD  HEENT: OP clear, mucus membranes moist  SKIN: warm, dry. No rashes. Neuro: No focal deficits  Musculoskeletal: Muscle strength 5/5 all ext  Psychiatric: Mood and affect normal  Neck: No JVD, no carotid bruits, no thyromegaly, no lymphadenopathy.  Lungs:Clear bilaterally, no wheezes, rhonci, crackles Cardiovascular: Regular rate and rhythm. *** Harsh systolic murmur.  Abdomen:Soft. Bowel sounds present. Non-tender.  Extremities: No lower extremity edema. Pulses are 2 + in the bilateral DP/PT.  EKG:  EKG is *** ordered today. The ekg ordered today demonstrates  Echo 04/07/24:  1. Left ventricular ejection fraction, by estimation, is 55%. The left  ventricle has normal function. The left ventricle has no regional wall  motion abnormalities. There is mild concentric left ventricular  hypertrophy. Left ventricular diastolic  parameters are indeterminate.   2. Right ventricular systolic function is normal. The right ventricular  size is normal. Tricuspid regurgitation signal is inadequate for assessing  PA pressure.   3. Left atrial size was mildly dilated.   4. The mitral valve is degenerative. Trivial mitral valve regurgitation.  No evidence of mitral stenosis.   5. The aortic valve is tricuspid. There is severe calcifcation of the  aortic valve. Aortic valve regurgitation is trivial. Severe aortic valve  stenosis. Aortic valve area, by VTI measures 0.65 cm. Aortic valve mean  gradient measures 40.0 mmHg.   6. Aortic dilatation noted. There is borderline dilatation of the aortic  root, measuring 38 mm. There is mild dilatation of the ascending aorta,  measuring 39 mm.   7. The inferior vena cava is normal in size with greater than 50%  respiratory variability, suggesting right atrial pressure of 3 mmHg.   8. The patient was in atrial fibrillation.   FINDINGS   Left Ventricle: Left ventricular ejection fraction, by estimation, is  55%. The left  ventricle has normal function. The left ventricle has no  regional wall motion abnormalities. Definity  contrast agent was given IV  to delineate the left ventricular  endocardial borders. The left ventricular internal cavity size was normal  in size. There is mild concentric left ventricular hypertrophy. Left  ventricular diastolic parameters are indeterminate.   Right Ventricle: The right ventricular size is normal. No increase in  right ventricular wall thickness. Right ventricular systolic function is  normal.  Tricuspid regurgitation signal is inadequate for assessing PA  pressure.   Left Atrium: Left atrial size was mildly dilated.   Right Atrium: Right atrial size was normal in size.   Pericardium: There is no evidence of pericardial effusion.   Mitral Valve: The mitral valve is degenerative in appearance. Mild mitral  annular calcification. Trivial mitral valve regurgitation. No evidence of  mitral valve stenosis.   Tricuspid Valve: The tricuspid valve is normal in structure. Tricuspid  valve regurgitation is trivial.   The aortic valve is tricuspid. There is severe calcifcation of the aortic  valve. Aortic valve regurgitation is trivial. Severe aortic stenosis is  present.  Pulmonic Valve: The pulmonic valve was normal in structure. Pulmonic valve  regurgitation is not visualized.   Aorta: Aortic dilatation noted. There is borderline dilatation of the  aortic root, measuring 38 mm. There is mild dilatation of the ascending  aorta, measuring 39 mm.   Venous: The inferior vena cava is normal in size with greater than 50%  respiratory variability, suggesting right atrial pressure of 3 mmHg.   IAS/Shunts: No atrial level shunt detected by color flow Doppler.     LEFT VENTRICLE  PLAX 2D  LVIDd:         5.70 cm  LVIDs:         4.20 cm  LV PW:         1.10 cm  LV IVS:        1.10 cm  LVOT diam:     2.00 cm  LV SV:         50  LV SV Index:   23  LVOT Area:     3.14  cm     RIGHT VENTRICLE             IVC  RV Basal diam:  4.00 cm     IVC diam: 1.70 cm  RV S prime:     15.70 cm/s  TAPSE (M-mode): 2.3 cm   LEFT ATRIUM             Index        RIGHT ATRIUM           Index  LA diam:        5.20 cm 2.41 cm/m   RA Area:     12.40 cm  LA Vol (A2C):   68.6 ml 31.86 ml/m  RA Volume:   28.90 ml  13.42 ml/m  LA Vol (A4C):   79.1 ml 36.74 ml/m  LA Biplane Vol: 74.1 ml 34.41 ml/m   AORTIC VALVE  AV Area (Vmax):    0.74 cm  AV Area (Vmean):   0.72 cm  AV Area (VTI):     0.65 cm  AV Vmax:           363.60 cm/s  AV Vmean:          252.000 cm/s  AV VTI:            0.765 m  AV Peak Grad:      52.9 mmHg  AV Mean Grad:      40.0 mmHg  LVOT Vmax:         85.97 cm/s  LVOT Vmean:        57.500 cm/s  LVOT VTI:          0.158 m  LVOT/AV VTI ratio: 0.21  AI PHT:            437 msec  AORTA  Ao Root diam: 3.80 cm  Ao Asc diam:  3.90 cm   MV E velocity: 117.00 cm/s                              SHUNTS                              Systemic VTI:  0.16 m                              Systemic Diam: 2.00 cm    Assessment and Plan:   1. Severe Aortic Valve Stenosis:   He has severe aortic valve stenosis. NYHA class *** symptoms. I have personally reviewed the echo images. The aortic valve is thickened and calcified with limited leaflet mobility. His LV systolic function is slightly lower than 6 months ago. I think he would benefit from AVR. Given advanced age, he is not a good candidate for conventional AVR by surgical approach. I think he may be a good candidate for TAVR.   I have reviewed the natural history of aortic stenosis with the patient and their family members  who are present today. We have discussed the limitations of medical therapy and the poor prognosis associated with symptomatic aortic stenosis. We have reviewed potential treatment options, including palliative medical therapy, conventional surgical aortic valve replacement, and transcatheter  aortic valve replacement. We discussed treatment options in the context of the patient's specific comorbid medical conditions.   He would like to proceed with planning for TAVR. I will arrange a right and left heart catheterization at [facility] ***. Risks and benefits of the cath procedure and the valve procedure are reviewed with the patient. After the cath, he will have a cardiac CT, CTA of the chest/abdomen and pelvis and will then be referred to see one of the CT surgeons on our TAVR team.      Labs/ tests ordered today include:  No orders of the defined types were placed in this encounter.  Disposition:   F/U with me in 6 months  Signed, [name], MD, [facility] 04/28/2024 12:52 PM    [facility] [address] [place], [place], [place]  [address] Phone: [phone]; Fax: [phone]

## 2024-04-29 ENCOUNTER — Ambulatory Visit: Attending: Cardiovascular Disease | Admitting: Cardiovascular Disease

## 2024-04-29 ENCOUNTER — Other Ambulatory Visit: Payer: Self-pay | Admitting: *Deleted

## 2024-04-29 ENCOUNTER — Encounter: Payer: Self-pay | Admitting: Cardiovascular Disease

## 2024-04-29 ENCOUNTER — Ambulatory Visit: Admitting: Cardiovascular Disease

## 2024-04-29 VITALS — BP 127/72 | HR 62 | Ht 66.0 in | Wt 240.0 lb

## 2024-04-29 DIAGNOSIS — I350 Nonrheumatic aortic (valve) stenosis: Secondary | ICD-10-CM

## 2024-04-29 NOTE — Progress Notes (Signed)
 Pre Surgical Assessment: 5 M Walk Test  43M=16.23ft  5 Meter Walk Test- trial 1: 5.94 seconds 5 Meter Walk Test- trial 2: 6.74 seconds 5 Meter Walk Test- trial 3: 6.21 seconds 5 Meter Walk Test Average: 6.30 seconds

## 2024-04-29 NOTE — Patient Instructions (Addendum)
 Medication Instructions:  Your physician recommends that you continue on your current medications as directed. Please refer to the Current Medication list given to you today.  *If you need a refill on your cardiac medications before your next appointment, please call your pharmacy*  Lab Work: Have lab work (BMP and CBC) drawn in the lab on the first floor when here for appointment with Dr Mona If you have labs (blood work) drawn today and your tests are completely normal, you will receive your results only by: MyChart Message (if you have MyChart) OR A paper copy in the mail If you have any lab test that is abnormal or we need to change your treatment, we will call you to review the results.  Testing/Procedures: Your physician has requested that you have a cardiac catheterization. Cardiac catheterization is used to diagnose and/or treat various heart conditions. Doctors may recommend this procedure for a number of different reasons. The most common reason is to evaluate chest pain. Chest pain can be a symptom of coronary artery disease (CAD), and cardiac catheterization can show whether plaque is narrowing or blocking your heart's arteries. This procedure is also used to evaluate the valves, as well as measure the blood flow and oxygen levels in different parts of your heart. For further information please visit [email or web address] Please follow instruction sheet, as given. Scheduled for 06/04/24  Follow-Up: At [facility], you and your health needs are our priority.  As part of our continuing mission to provide you with exceptional heart care, our providers are all part of one team.  This team includes your primary Cardiologist (physician) and Advanced Practice Providers or APPs (Physician Assistants and Nurse Practitioners) who all work together to provide you with the care you need, when you need it.  Your next appointment:   To be determined after procedure  Provider:    [name], MD    We recommend signing up for the patient portal called MyChart.  Sign up information is provided on this After Visit Summary.  MyChart is used to connect with patients for Virtual Visits (Telemedicine).  Patients are able to view lab/test results, encounter notes, upcoming appointments, etc.  Non-urgent messages can be sent to your provider as well.   To learn more about what you can do with MyChart, go to [email or web address].   Other Instructions    [place] [facility] A DEPT OF [place] AT [address] A DEPT OF THE [facility]. [facility] [address]  [place] [address] Dept: [phone] Loc: [phone]  Shadman Tozzi  04/29/2024  You are scheduled for a Cardiac Catheterization on Wednesday, January 14 with Dr. [name].  1. Please arrive at the [facility] (Main Entrance A) at [facility]: [address] [place], [place] [address] at 8:00 AM (This time is 2 hour(s) before your procedure to ensure your preparation).   Free valet parking service is available. You will check in at ADMITTING. The support person will be asked to wait in the waiting room.  It is OK to have someone drop you off and come back when you are ready to be discharged.    Special note: Every effort is made to have your procedure done on time. Please understand that emergencies sometimes delay scheduled procedures.  2. Diet: Nothing to eat after midnight.   3. Hydration: You need to be well hydrated before your procedure. On January 14, you may drink approved liquids (see below)  until 2 hours before the procedure, with 16 oz of water as your last intake.   List of approved liquids water, clear juice, clear tea, black coffee, fruit juices, non-citric and without pulp, carbonated beverages, Gatorade, Kool -Aid, plain Jello-O and plain ice popsicles.  4. Labs: You will need to have blood drawn on Tuesday, December 16 at [facility] (1st Floor), [address] [place], [place] [address]. You do not need to be fasting.  5. Medication instructions in preparation for your procedure: Stop Eliquis  2 days prior to procedure.  Last dose will be evening dose on Sunday June 01, 2024 Do not take furosemide  the day of the procedure   Contrast Allergy : No   On the morning of your procedure, take your Aspirin 81 mg and any morning medicines NOT listed above.  You may use sips of water.  6. Plan to go home the same day, you will only stay overnight if medically necessary. 7. Bring a current list of your medications and current insurance cards. 8. You MUST have a responsible person to drive you home. 9. Someone MUST be with you the first 24 hours after you arrive home or your discharge will be delayed. 10. Please wear clothes that are easy to get on and off and wear slip-on shoes.  Thank you for allowing us  to care for you!   -- [facility]

## 2024-05-06 ENCOUNTER — Ambulatory Visit: Attending: Internal Medicine | Admitting: Internal Medicine

## 2024-05-06 ENCOUNTER — Encounter: Payer: Self-pay | Admitting: Internal Medicine

## 2024-05-06 VITALS — BP 133/68 | HR 66 | Ht 66.0 in | Wt 242.4 lb

## 2024-05-06 DIAGNOSIS — I447 Left bundle-branch block, unspecified: Secondary | ICD-10-CM

## 2024-05-06 DIAGNOSIS — I4819 Other persistent atrial fibrillation: Secondary | ICD-10-CM

## 2024-05-06 DIAGNOSIS — I350 Nonrheumatic aortic (valve) stenosis: Secondary | ICD-10-CM

## 2024-05-06 NOTE — Progress Notes (Signed)
 OFFICE NOTE  Chief Complaint:  Follow-up  Primary Care Physician: Derrick Nottingham, MD  HPI:  Derrick Alexander is a pleasant 78-year-old male with no significant past medical history. He only takes aspirin occasionally as needed. Unfortunately he's a 60 year smoker that currently smoking. Recently he's had 3 different episodes of what seemed to be brief syncope. All of the episodes were at rest and he was seated at the time. His wife said that he was talking with her and then briefly seem to lose consciousness but then was easily awakened and was not confused. He did not slump over lose control of bowel or bladder function. There was no seizure-like activity. He reported some mild prodrome to the symptoms. In fact he felt some tingling and numbness around his scalp that felt like he was wearing a tight baseball cap as well as some graying of his vision. He underwent carotid Dopplers 2 days ago and those results are pending. Of note his blood pressure was mildly elevated today but he has no history of hypertension in fact he was thought to be possibly hypotensive or orthostatic. He denies any chest pain or worsening shortness of breath. His EKG however shows left bundle branch pattern as well as ST and T wave abnormalities laterally with 1 mm of horizontal ST depression at rest concerning for ischemia.  Derrick Alexander returns to follow-up on his monitor. Monitor shows NSVT up 16 beat runs and periods of ventricular escape rhythm in the 30's.  Either could explain syncope. His nuclear stress test was negative for ischemia with normal LV function. Suspect he will likely need AICD/PPM. I contacted Derrick Alexander to recommend an urgent referral but he wished to discuss it further with me in the office first. He understands that these arrhythmias could be life-threatening.  07/23/2018  Derrick Alexander is seen today in follow-up.  I am not seen him in the last couple years when he was initially having ventricular tachycardia and  I referred him to Dr. Inocencio.  He has been managed there and fortunately is had no other significant VT.  He does have a EF and has been appropriately anticoagulated and managed for that.  He is maintaining sinus rhythm.  He has been referred back to me for general cardiology needs.  He does have morbid obesity, recently having significant weight gain after he stopped smoking about a year ago.  He remains on the nicotine  patch.  EKG is stable showing bifascicular block but normal sinus rhythm.  Blood pressure is at goal today.  His cholesterol is elevated based on numbers in June 2019.  His total cholesterol is 219, HDL 43, LDL 135 and triglycerides 794.  Hemoglobin A1c of 6.0.  Additionally, he does have a history of snoring and witnessed apnea.  This is persisted and he has significant daytime fatigue and somnolence.  His EPWSS score is 17, highly suggestive of sleep apnea.  He did seem interested in a sleep study.  02/03/2019  Derrick Alexander is seen today in follow-up.  Overall he seems to be doing well.  He denies any recurrent arrhythmias.  EKG today shows sinus rhythm at 64.  He denies any chest pain.  I am not aware that he underwent his sleep study.  He apparently was set up for home sleep study.  There were a couple appointments that were canceled and he says his wife has more knowledge about what actually happened but he did recall having had a home sleep test.  Perhaps this was arranged through his primary care provider.  02/03/2020  Derrick Alexander is seen today for annual follow-up. Which is seen by Dr. Inocencio a week ago. He declines any further NSVT. He was noted to be on diltiazem  and flecainide  however his wife reports he does not take diltiazem . This was discontinued due to bradycardia. Basically he is only on Xarelto . I think he was also taken off of flecainide  due to significant interactions. Is not clear if Dr. Inocencio is aware that he was not on either of these medications. I will mention this to  him.  11/10/2022  Derrick Alexander is seen today in follow-up.  Have not seen him in quite some time.  Earlier this year he had a GI bleed, in fact he had 2 GI bleeds and was switched from Xarelto  over to Eliquis .  He has not had any worsening palpitations but did have frequent PVCs.  Repeat echo this spring showed normal LV function however he has a least moderate aortic stenosis.  The gradients could not be reliably measured.  He says he denies any shortness of breath or chest pain.  EKG today shows sinus rhythm with frequent PVCs.  He has had an issue with hyperthyroidism was found to have some nodules and is undergoing workup through his primary care provider.  He is on Tapazole.  Being hyperthyroid and anemic is also contributing probably to increased outflow gradient.  05/07/2023  Derrick Alexander returns today for follow-up of aortic stenosis.  He had an echo in August which showed probable severe aortic stenosis with a mean gradient of 36 mmHg and aortic valve area of 0.5 cm.  The DI was 0.21.  He apparently was asymptomatic.  I asked him again today whether or not he is having any symptoms.  Plan was to repeat his echo around this time to see if he had any further progression.  He has had paroxysmal atrial fibrillation.  While he was in a sinus rhythm previously he is now in A-fib today.  He is also having PVCs which were noted before.  He is anticoagulated on Eliquis .  He does have a history of GI bleeding but has not had any recently.  He does report some worsening fatigue.  He denies any chest pain.  11/13/2023  Derrick Alexander is seen today in follow-up.  He had recently seen Derrick Alexander about 2 weeks ago for follow-up of an echo.  This shows what is felt to be moderate aortic stenosis by gradients.  Surprisingly his echo mean gradient was measured lower at 26 mmHg but previously had been 39 mmHg.  He remains asymptomatic.  He has had no further syncopal episodes.  He does report some shortness of breath when  walking back from taking his trash cans out to the street.  He reports poor sleep and daytime somnolence.  He had sleep study about a year ago through [facility] pulmonary but has been noncompliant with CPAP recently due to issues with his device.  I have encouraged them to get new equipment and start back on this.  05/06/2024  Derrick Alexander is seen today in follow-up.  He recently saw Dr. Cash for follow-up of aortic stenosis.  Repeat echo in November about 5 months after his last echo now shows severe aortic stenosis with a mean gradient of 40 mmHg.  There is mild dilatation of the ascending aorta.  He does report some worsening fatigue and shortness of breath.  Based on this he is scheduled  to undergo left and right heart catheterization in January probably followed by CT for TAVR planning.  EKG today shows sinus rhythm with PVCs but no A-fib.  Blood pressure is 133/68.  Plan is to obtain preprocedure lab work today.  PMHx: Past Medical History:  Diagnosis Date   A-fib [facility])    Abnormal EKG 06/18/2015   Aortic atherosclerosis    AV malformation of GI tract    Bradycardia    Carotid artery disease    COPD (chronic obstructive pulmonary disease) (HCC)    COPD with exacerbation (HCC)    Diastolic dysfunction    Faintness 06/18/2015   GIB (gastrointestinal bleeding)    Hepatic steatosis    by imaging   Hypertension    LBBB (left bundle branch block) 06/18/2015   Moderate aortic stenosis    NSVT (nonsustained ventricular tachycardia) (HCC)    Obesity    OSA on CPAP    PVC's (premature ventricular contractions)    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 (LAFB)    Syncope 08/06/2015   Tobacco use    V-tach (HCC) 08/06/2015    Past Surgical History:  Procedure Laterality Date   ATRIAL FIBRILLATION ABLATION N/A 09/17/2020   Procedure: ATRIAL FIBRILLATION ABLATION;  Surgeon: Derrick Alexander Soyla Lunger, MD;  Location: [facility] INVASIVE CV LAB;  Service: Cardiovascular;   Laterality: N/A;   BIOPSY  07/10/2022   Procedure: BIOPSY;  Surgeon: Rollin Dover, MD;  Location: [facility] ENDOSCOPY;  Service: Gastroenterology;;   COLONOSCOPY WITH PROPOFOL  N/A 03/07/2019   Procedure: COLONOSCOPY WITH PROPOFOL ;  Surgeon: Rollin Dover, MD;  Location: [facility] ENDOSCOPY;  Service: Endoscopy;  Laterality: N/A;   COLONOSCOPY WITH PROPOFOL  N/A 07/10/2022   Procedure: COLONOSCOPY WITH PROPOFOL ;  Surgeon: Rollin Dover, MD;  Location: [facility] ENDOSCOPY;  Service: Gastroenterology;  Laterality: N/A;   ESOPHAGOGASTRODUODENOSCOPY (EGD) WITH PROPOFOL  N/A 04/28/2022   Procedure: ESOPHAGOGASTRODUODENOSCOPY (EGD) WITH PROPOFOL ;  Surgeon: Rollin Dover, MD;  Location: [facility] ENDOSCOPY;  Service: Gastroenterology;  Laterality: N/A;   ESOPHAGOGASTRODUODENOSCOPY (EGD) WITH PROPOFOL  N/A 07/07/2022   Procedure: ESOPHAGOGASTRODUODENOSCOPY (EGD) WITH PROPOFOL ;  Surgeon: Rollin Dover, MD;  Location: [facility] ENDOSCOPY;  Service: Gastroenterology;  Laterality: N/A;   HOT HEMOSTASIS N/A 04/28/2022   Procedure: HOT HEMOSTASIS (ARGON PLASMA COAGULATION/BICAP);  Surgeon: Rollin Dover, MD;  Location: [facility] ENDOSCOPY;  Service: Gastroenterology;  Laterality: N/A;   HOT HEMOSTASIS N/A 07/07/2022   Procedure: HOT HEMOSTASIS (ARGON PLASMA COAGULATION/BICAP);  Surgeon: Rollin Dover, MD;  Location: [facility] ENDOSCOPY;  Service: Gastroenterology;  Laterality: N/A;   POLYPECTOMY  03/07/2019   Procedure: POLYPECTOMY;  Surgeon: Rollin Dover, MD;  Location: [facility] ENDOSCOPY;  Service: Endoscopy;;   POLYPECTOMY  07/10/2022   Procedure: POLYPECTOMY;  Surgeon: Rollin Dover, MD;  Location: [facility] ENDOSCOPY;  Service: Gastroenterology;;    FAMHx:  Family History  Problem Relation Age of Onset   Diabetes Mother    Cancer Father    Heart disease Brother    COPD Brother     SOCHx:   reports that he quit smoking about 6 years ago. His smoking use included cigarettes. He started smoking about 66 years ago. He has a 120 pack-year smoking history. He has never  used smokeless tobacco. He reports that he does not drink alcohol  and does not use drugs.  ALLERGIES:  Allergies  Allergen Reactions   Cardizem  [Diltiazem ] Shortness Of Breath   Pacerone  [Amiodarone ] Shortness Of Breath, Nausea Only and Other (See Comments)    Abdominal pain Dizziness   Tambocor  [Flecainide ] Shortness Of Breath  ROS: Pertinent items noted in HPI and remainder of comprehensive ROS otherwise negative.  HOME MEDS: Current Outpatient Medications  Medication Sig Dispense Refill   cetirizine (ZYRTEC) 10 MG tablet Take 10 mg by mouth daily.     ELIQUIS  5 MG TABS tablet TAKE 1 TABLET(5 MG) BY MOUTH TWICE DAILY. 60 tablet 5   furosemide  (LASIX ) 40 MG tablet TAKE 1 AND 1/2 TABLETS(60 MG) BY MOUTH TWICE DAILY 270 tablet 2   latanoprost  (XALATAN ) 0.005 % ophthalmic solution Place 1 drop into both eyes at bedtime.     methimazole (TAPAZOLE) 5 MG tablet Take 5 mg by mouth daily.     metoprolol  tartrate (LOPRESSOR ) 25 MG tablet TAKE 1/2 TABLET(12.5 MG) BY MOUTH TWICE DAILY 90 tablet 2   Polyethyl Glycol-Propyl Glycol (SYSTANE OP) Place 1 drop into both eyes daily as needed (dry eyes).     rosuvastatin  (CRESTOR ) 20 MG tablet Take 20 mg by mouth daily.     tamsulosin (FLOMAX) 0.4 MG CAPS capsule 1 capsule.     acetaminophen  (TYLENOL ) 325 MG tablet Take 2 tablets (650 mg total) by mouth every 6 (six) hours as needed for mild pain (or Fever >/= 101). (Patient not taking: Reported on 05/06/2024)     albuterol  (PROVENTIL ) (2.5 MG/3ML) 0.083% nebulizer solution Take 3 mLs (2.5 mg total) by nebulization every 4 (four) hours as needed for wheezing or shortness of breath. (Patient not taking: Reported on 05/06/2024) 75 mL 12   albuterol  (VENTOLIN  HFA) 108 (90 Base) MCG/ACT inhaler Inhale 2 puffs into the lungs every 6 (six) hours as needed for wheezing or shortness of breath. (Patient not taking: Reported on 05/06/2024) 8 g 6   fluticasone -salmeterol (WIXELA INHUB) 250-50 MCG/ACT AEPB Inhale  1 puff into the lungs in the morning and at bedtime. (Patient not taking: Reported on 05/06/2024) 60 each 11   No current facility-administered medications for this visit.    LABS/IMAGING: No results found for this or any previous visit (from the past 48 hours). No results found.  WEIGHTS: Wt Readings from Last 3 Encounters:  05/06/24 242 lb 6.4 oz (110 kg)  04/29/24 240 lb (108.9 kg)  02/04/24 238 lb (108 kg)    VITALS: BP 133/68 (BP Location: Left Arm, Patient Position: Sitting, Cuff Size: Large)   Pulse 66   Ht 5' 6 (1.676 m)   Wt 242 lb 6.4 oz (110 kg)   SpO2 99%   BMI 39.12 kg/m   EXAM: General appearance: alert, no distress, and morbidly obese Neck: no carotid bruit, no JVD, and thyroid  not enlarged, symmetric, no tenderness/mass/nodules Lungs: wheezes bilaterally Heart: S1, S2 normal and systolic murmur: systolic ejection 3/6, musical at 2nd right intercostal space Abdomen: soft, non-tender; bowel sounds normal; no masses,  no organomegaly Extremities: extremities normal, atraumatic, no cyanosis or edema Pulses: 2+ and symmetric Skin: Skin color, texture, turgor normal. No rashes or lesions Neurologic: Grossly normal Psych: Pleasant  EKG: EKG Interpretation Date/Time:  Tuesday May 06 2024 09:45:00 EST Ventricular Rate:  66 PR Interval:  220 QRS Duration:  146 QT Interval:  460 QTC Calculation: 482 R Axis:   -70  Text Interpretation: Sinus rhythm with 1st degree A-V block with occasional Premature ventricular complexes Left axis deviation Right bundle branch block Left ventricular hypertrophy with repolarization abnormality ( R in aVL ) When compared with ECG of 31-Oct-2023 08:42, T wave inversion now evident in Lateral leads Confirmed by Mona Kent [phone]) on 05/06/2024 9:58:39 AM    ASSESSMENT: NSVT PAF -  CHADSVASC score of 4 (history of prior ablation) Bradycardia - ventricular escape in the 30's Syncope x 3 HTN Abnormal EKG with left bundle  branch / LAFB pattern Tobacco abuse Had CPAP Aortic stenosis History of GI bleed x 2 on Xarelto   PLAN: 1.   Derrick Alexander has had some progressive shortness of breath and fatigue.  He now has a mean gradient of 40 mmHg across the aortic valve with severe calcification.  Plan for TAVR workup in January.  EKG today shows a sinus rhythm with PVCs.  No evidence of A-fib.  He denies any chest pain.  Will get preprocedure labs as requested.  Plan follow-up with me in 6 months or sooner as necessary.  Vinie KYM Maxcy, MD, [facility], FNLA, FACP  [place]  [facility]  Medical Director of the Advanced Lipid Disorders &  Cardiovascular Risk Reduction Clinic Diplomate of the American Board of Clinical Lipidology Attending Cardiologist  Direct Dial: [phone]  Fax: [phone]  Website:  [email or web address] Vinie BROCKS Shakeisha Horine 05/06/2024, 9:58 AM

## 2024-05-06 NOTE — H&P (View-Only) (Signed)
 OFFICE NOTE  Chief Complaint:  Follow-up  Primary Care Physician: Clarice Nottingham, MD  HPI:  Tomio Kirk is a pleasant 78-year-old male with no significant past medical history. He only takes aspirin occasionally as needed. Unfortunately he's a 60 year smoker that currently smoking. Recently he's had 3 different episodes of what seemed to be brief syncope. All of the episodes were at rest and he was seated at the time. His wife said that he was talking with her and then briefly seem to lose consciousness but then was easily awakened and was not confused. He did not slump over lose control of bowel or bladder function. There was no seizure-like activity. He reported some mild prodrome to the symptoms. In fact he felt some tingling and numbness around his scalp that felt like he was wearing a tight baseball cap as well as some graying of his vision. He underwent carotid Dopplers 2 days ago and those results are pending. Of note his blood pressure was mildly elevated today but he has no history of hypertension in fact he was thought to be possibly hypotensive or orthostatic. He denies any chest pain or worsening shortness of breath. His EKG however shows left bundle branch pattern as well as ST and T wave abnormalities laterally with 1 mm of horizontal ST depression at rest concerning for ischemia.  Mr. Tesch returns to follow-up on his monitor. Monitor shows NSVT up 16 beat runs and periods of ventricular escape rhythm in the 30's.  Either could explain syncope. His nuclear stress test was negative for ischemia with normal LV function. Suspect he will likely need AICD/PPM. I contacted Mr. Raz to recommend an urgent referral but he wished to discuss it further with me in the office first. He understands that these arrhythmias could be life-threatening.  07/23/2018  Mr. Auvil is seen today in follow-up.  I am not seen him in the last couple years when he was initially having ventricular tachycardia and  I referred him to Dr. Inocencio.  He has been managed there and fortunately is had no other significant VT.  He does have a EF and has been appropriately anticoagulated and managed for that.  He is maintaining sinus rhythm.  He has been referred back to me for general cardiology needs.  He does have morbid obesity, recently having significant weight gain after he stopped smoking about a year ago.  He remains on the nicotine  patch.  EKG is stable showing bifascicular block but normal sinus rhythm.  Blood pressure is at goal today.  His cholesterol is elevated based on numbers in June 2019.  His total cholesterol is 219, HDL 43, LDL 135 and triglycerides 794.  Hemoglobin A1c of 6.0.  Additionally, he does have a history of snoring and witnessed apnea.  This is persisted and he has significant daytime fatigue and somnolence.  His EPWSS score is 17, highly suggestive of sleep apnea.  He did seem interested in a sleep study.  02/03/2019  Mr. Myrick is seen today in follow-up.  Overall he seems to be doing well.  He denies any recurrent arrhythmias.  EKG today shows sinus rhythm at 64.  He denies any chest pain.  I am not aware that he underwent his sleep study.  He apparently was set up for home sleep study.  There were a couple appointments that were canceled and he says his wife has more knowledge about what actually happened but he did recall having had a home sleep test.  Perhaps this was arranged through his primary care provider.  02/03/2020  Mr. Adrian is seen today for annual follow-up. Which is seen by Dr. Inocencio a week ago. He declines any further NSVT. He was noted to be on diltiazem  and flecainide  however his wife reports he does not take diltiazem . This was discontinued due to bradycardia. Basically he is only on Xarelto . I think he was also taken off of flecainide  due to significant interactions. Is not clear if Dr. Inocencio is aware that he was not on either of these medications. I will mention this to  him.  11/10/2022  Mr. Brafford is seen today in follow-up.  Have not seen him in quite some time.  Earlier this year he had a GI bleed, in fact he had 2 GI bleeds and was switched from Xarelto  over to Eliquis .  He has not had any worsening palpitations but did have frequent PVCs.  Repeat echo this spring showed normal LV function however he has a least moderate aortic stenosis.  The gradients could not be reliably measured.  He says he denies any shortness of breath or chest pain.  EKG today shows sinus rhythm with frequent PVCs.  He has had an issue with hyperthyroidism was found to have some nodules and is undergoing workup through his primary care provider.  He is on Tapazole.  Being hyperthyroid and anemic is also contributing probably to increased outflow gradient.  05/07/2023  Mr. Mahmood returns today for follow-up of aortic stenosis.  He had an echo in August which showed probable severe aortic stenosis with a mean gradient of 36 mmHg and aortic valve area of 0.5 cm.  The DI was 0.21.  He apparently was asymptomatic.  I asked him again today whether or not he is having any symptoms.  Plan was to repeat his echo around this time to see if he had any further progression.  He has had paroxysmal atrial fibrillation.  While he was in a sinus rhythm previously he is now in A-fib today.  He is also having PVCs which were noted before.  He is anticoagulated on Eliquis .  He does have a history of GI bleeding but has not had any recently.  He does report some worsening fatigue.  He denies any chest pain.  11/13/2023  Mr. Mccarley is seen today in follow-up.  He had recently seen Medford Cash about 2 weeks ago for follow-up of an echo.  This shows what is felt to be moderate aortic stenosis by gradients.  Surprisingly his echo mean gradient was measured lower at 26 mmHg but previously had been 39 mmHg.  He remains asymptomatic.  He has had no further syncopal episodes.  He does report some shortness of breath when  walking back from taking his trash cans out to the street.  He reports poor sleep and daytime somnolence.  He had sleep study about a year ago through [facility] pulmonary but has been noncompliant with CPAP recently due to issues with his device.  I have encouraged them to get new equipment and start back on this.  05/06/2024  Mr. Otten is seen today in follow-up.  He recently saw Dr. Cash for follow-up of aortic stenosis.  Repeat echo in November about 5 months after his last echo now shows severe aortic stenosis with a mean gradient of 40 mmHg.  There is mild dilatation of the ascending aorta.  He does report some worsening fatigue and shortness of breath.  Based on this he is scheduled  to undergo left and right heart catheterization in January probably followed by CT for TAVR planning.  EKG today shows sinus rhythm with PVCs but no A-fib.  Blood pressure is 133/68.  Plan is to obtain preprocedure lab work today.  PMHx: Past Medical History:  Diagnosis Date   A-fib [facility])    Abnormal EKG 06/18/2015   Aortic atherosclerosis    AV malformation of GI tract    Bradycardia    Carotid artery disease    COPD (chronic obstructive pulmonary disease) (HCC)    COPD with exacerbation (HCC)    Diastolic dysfunction    Faintness 06/18/2015   GIB (gastrointestinal bleeding)    Hepatic steatosis    by imaging   Hypertension    LBBB (left bundle branch block) 06/18/2015   Moderate aortic stenosis    NSVT (nonsustained ventricular tachycardia) (HCC)    Obesity    OSA on CPAP    PVC's (premature ventricular contractions)    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 (LAFB)    Syncope 08/06/2015   Tobacco use    V-tach (HCC) 08/06/2015    Past Surgical History:  Procedure Laterality Date   ATRIAL FIBRILLATION ABLATION N/A 09/17/2020   Procedure: ATRIAL FIBRILLATION ABLATION;  Surgeon: Inocencio Soyla Lunger, MD;  Location: [facility] INVASIVE CV LAB;  Service: Cardiovascular;   Laterality: N/A;   BIOPSY  07/10/2022   Procedure: BIOPSY;  Surgeon: Rollin Dover, MD;  Location: [facility] ENDOSCOPY;  Service: Gastroenterology;;   COLONOSCOPY WITH PROPOFOL  N/A 03/07/2019   Procedure: COLONOSCOPY WITH PROPOFOL ;  Surgeon: Rollin Dover, MD;  Location: [facility] ENDOSCOPY;  Service: Endoscopy;  Laterality: N/A;   COLONOSCOPY WITH PROPOFOL  N/A 07/10/2022   Procedure: COLONOSCOPY WITH PROPOFOL ;  Surgeon: Rollin Dover, MD;  Location: [facility] ENDOSCOPY;  Service: Gastroenterology;  Laterality: N/A;   ESOPHAGOGASTRODUODENOSCOPY (EGD) WITH PROPOFOL  N/A 04/28/2022   Procedure: ESOPHAGOGASTRODUODENOSCOPY (EGD) WITH PROPOFOL ;  Surgeon: Rollin Dover, MD;  Location: [facility] ENDOSCOPY;  Service: Gastroenterology;  Laterality: N/A;   ESOPHAGOGASTRODUODENOSCOPY (EGD) WITH PROPOFOL  N/A 07/07/2022   Procedure: ESOPHAGOGASTRODUODENOSCOPY (EGD) WITH PROPOFOL ;  Surgeon: Rollin Dover, MD;  Location: [facility] ENDOSCOPY;  Service: Gastroenterology;  Laterality: N/A;   HOT HEMOSTASIS N/A 04/28/2022   Procedure: HOT HEMOSTASIS (ARGON PLASMA COAGULATION/BICAP);  Surgeon: Rollin Dover, MD;  Location: [facility] ENDOSCOPY;  Service: Gastroenterology;  Laterality: N/A;   HOT HEMOSTASIS N/A 07/07/2022   Procedure: HOT HEMOSTASIS (ARGON PLASMA COAGULATION/BICAP);  Surgeon: Rollin Dover, MD;  Location: [facility] ENDOSCOPY;  Service: Gastroenterology;  Laterality: N/A;   POLYPECTOMY  03/07/2019   Procedure: POLYPECTOMY;  Surgeon: Rollin Dover, MD;  Location: [facility] ENDOSCOPY;  Service: Endoscopy;;   POLYPECTOMY  07/10/2022   Procedure: POLYPECTOMY;  Surgeon: Rollin Dover, MD;  Location: [facility] ENDOSCOPY;  Service: Gastroenterology;;    FAMHx:  Family History  Problem Relation Age of Onset   Diabetes Mother    Cancer Father    Heart disease Brother    COPD Brother     SOCHx:   reports that he quit smoking about 6 years ago. His smoking use included cigarettes. He started smoking about 66 years ago. He has a 120 pack-year smoking history. He has never  used smokeless tobacco. He reports that he does not drink alcohol  and does not use drugs.  ALLERGIES:  Allergies  Allergen Reactions   Cardizem  [Diltiazem ] Shortness Of Breath   Pacerone  [Amiodarone ] Shortness Of Breath, Nausea Only and Other (See Comments)    Abdominal pain Dizziness   Tambocor  [Flecainide ] Shortness Of Breath  ROS: Pertinent items noted in HPI and remainder of comprehensive ROS otherwise negative.  HOME MEDS: Current Outpatient Medications  Medication Sig Dispense Refill   cetirizine (ZYRTEC) 10 MG tablet Take 10 mg by mouth daily.     ELIQUIS  5 MG TABS tablet TAKE 1 TABLET(5 MG) BY MOUTH TWICE DAILY. 60 tablet 5   furosemide  (LASIX ) 40 MG tablet TAKE 1 AND 1/2 TABLETS(60 MG) BY MOUTH TWICE DAILY 270 tablet 2   latanoprost  (XALATAN ) 0.005 % ophthalmic solution Place 1 drop into both eyes at bedtime.     methimazole (TAPAZOLE) 5 MG tablet Take 5 mg by mouth daily.     metoprolol  tartrate (LOPRESSOR ) 25 MG tablet TAKE 1/2 TABLET(12.5 MG) BY MOUTH TWICE DAILY 90 tablet 2   Polyethyl Glycol-Propyl Glycol (SYSTANE OP) Place 1 drop into both eyes daily as needed (dry eyes).     rosuvastatin  (CRESTOR ) 20 MG tablet Take 20 mg by mouth daily.     tamsulosin (FLOMAX) 0.4 MG CAPS capsule 1 capsule.     acetaminophen  (TYLENOL ) 325 MG tablet Take 2 tablets (650 mg total) by mouth every 6 (six) hours as needed for mild pain (or Fever >/= 101). (Patient not taking: Reported on 05/06/2024)     albuterol  (PROVENTIL ) (2.5 MG/3ML) 0.083% nebulizer solution Take 3 mLs (2.5 mg total) by nebulization every 4 (four) hours as needed for wheezing or shortness of breath. (Patient not taking: Reported on 05/06/2024) 75 mL 12   albuterol  (VENTOLIN  HFA) 108 (90 Base) MCG/ACT inhaler Inhale 2 puffs into the lungs every 6 (six) hours as needed for wheezing or shortness of breath. (Patient not taking: Reported on 05/06/2024) 8 g 6   fluticasone -salmeterol (WIXELA INHUB) 250-50 MCG/ACT AEPB Inhale  1 puff into the lungs in the morning and at bedtime. (Patient not taking: Reported on 05/06/2024) 60 each 11   No current facility-administered medications for this visit.    LABS/IMAGING: No results found for this or any previous visit (from the past 48 hours). No results found.  WEIGHTS: Wt Readings from Last 3 Encounters:  05/06/24 242 lb 6.4 oz (110 kg)  04/29/24 240 lb (108.9 kg)  02/04/24 238 lb (108 kg)    VITALS: BP 133/68 (BP Location: Left Arm, Patient Position: Sitting, Cuff Size: Large)   Pulse 66   Ht 5' 6 (1.676 m)   Wt 242 lb 6.4 oz (110 kg)   SpO2 99%   BMI 39.12 kg/m   EXAM: General appearance: alert, no distress, and morbidly obese Neck: no carotid bruit, no JVD, and thyroid  not enlarged, symmetric, no tenderness/mass/nodules Lungs: wheezes bilaterally Heart: S1, S2 normal and systolic murmur: systolic ejection 3/6, musical at 2nd right intercostal space Abdomen: soft, non-tender; bowel sounds normal; no masses,  no organomegaly Extremities: extremities normal, atraumatic, no cyanosis or edema Pulses: 2+ and symmetric Skin: Skin color, texture, turgor normal. No rashes or lesions Neurologic: Grossly normal Psych: Pleasant  EKG: EKG Interpretation Date/Time:  Tuesday May 06 2024 09:45:00 EST Ventricular Rate:  66 PR Interval:  220 QRS Duration:  146 QT Interval:  460 QTC Calculation: 482 R Axis:   -70  Text Interpretation: Sinus rhythm with 1st degree A-V block with occasional Premature ventricular complexes Left axis deviation Right bundle branch block Left ventricular hypertrophy with repolarization abnormality ( R in aVL ) When compared with ECG of 31-Oct-2023 08:42, T wave inversion now evident in Lateral leads Confirmed by Mona Kent [phone]) on 05/06/2024 9:58:39 AM    ASSESSMENT: NSVT PAF -  CHADSVASC score of 4 (history of prior ablation) Bradycardia - ventricular escape in the 30's Syncope x 3 HTN Abnormal EKG with left bundle  branch / LAFB pattern Tobacco abuse Had CPAP Aortic stenosis History of GI bleed x 2 on Xarelto   PLAN: 1.   Mr. Manninen has had some progressive shortness of breath and fatigue.  He now has a mean gradient of 40 mmHg across the aortic valve with severe calcification.  Plan for TAVR workup in January.  EKG today shows a sinus rhythm with PVCs.  No evidence of A-fib.  He denies any chest pain.  Will get preprocedure labs as requested.  Plan follow-up with me in 6 months or sooner as necessary.  Vinie KYM Maxcy, MD, [facility], FNLA, FACP  [place]  [facility]  Medical Director of the Advanced Lipid Disorders &  Cardiovascular Risk Reduction Clinic Diplomate of the American Board of Clinical Lipidology Attending Cardiologist  Direct Dial: [phone]  Fax: [phone]  Website:  [email or web address] Vinie BROCKS Shakeisha Horine 05/06/2024, 9:58 AM

## 2024-05-06 NOTE — Patient Instructions (Signed)
 Medication Instructions:  NO CHANGES  *If you need a refill on your cardiac medications before your next appointment, please call your pharmacy*  Lab Work: CBC and BMET today -- 1st Floor  If you have labs (blood work) drawn today and your tests are completely normal, you will receive your results only by: MyChart Message (if you have MyChart) OR A paper copy in the mail If you have any lab test that is abnormal or we need to change your treatment, we will call you to review the results.   Follow-Up: At [facility], you and your health needs are our priority.  As part of our continuing mission to provide you with exceptional heart care, our providers are all part of one team.  This team includes your primary Cardiologist (physician) and Advanced Practice Providers or APPs (Physician Assistants and Nurse Practitioners) who all work together to provide you with the care you need, when you need it.  Your next appointment:    6 months with Dr. Mona  We recommend signing up for the patient portal called MyChart.  Sign up information is provided on this After Visit Summary.  MyChart is used to connect with patients for Virtual Visits (Telemedicine).  Patients are able to view lab/test results, encounter notes, upcoming appointments, etc.  Non-urgent messages can be sent to your provider as well.   To learn more about what you can do with MyChart, go to [email or web address].   Other Instructions

## 2024-05-07 ENCOUNTER — Ambulatory Visit: Payer: Self-pay | Admitting: Cardiovascular Disease

## 2024-05-07 LAB — CBC
Hematocrit: 44.6 % (ref 37.5–51.0)
Hemoglobin: 14.4 g/dL (ref 13.0–17.7)
MCH: 28.2 pg (ref 26.6–33.0)
MCHC: 32.3 g/dL (ref 31.5–35.7)
MCV: 87 fL (ref 79–97)
Platelets: 186 x10E3/uL (ref 150–450)
RBC: 5.11 x10E6/uL (ref 4.14–5.80)
RDW: 14.2 % (ref 11.6–15.4)
WBC: 9 x10E3/uL (ref 3.4–10.8)

## 2024-05-07 LAB — BASIC METABOLIC PANEL WITH GFR
BUN/Creatinine Ratio: 12 (ref 10–24)
BUN: 13 mg/dL (ref 8–27)
CO2: 27 mmol/L (ref 20–29)
Calcium: 10.1 mg/dL (ref 8.6–10.2)
Chloride: 100 mmol/L (ref 96–106)
Creatinine, Ser: 1.1 mg/dL (ref 0.76–1.27)
Glucose: 118 mg/dL — ABNORMAL HIGH (ref 70–99)
Potassium: 4 mmol/L (ref 3.5–5.2)
Sodium: 142 mmol/L (ref 134–144)
eGFR: 68 mL/min/1.73 (ref >59)

## 2024-05-09 ENCOUNTER — Inpatient Hospital Stay: Admission: RE | Admit: 2024-05-09 | Discharge: 2024-05-09 | Attending: Acute Care | Admitting: Acute Care

## 2024-05-09 ENCOUNTER — Inpatient Hospital Stay: Admission: RE | Admit: 2024-05-09 | Discharge: 2024-05-09 | Attending: Internal Medicine | Admitting: Internal Medicine

## 2024-05-09 DIAGNOSIS — R911 Solitary pulmonary nodule: Secondary | ICD-10-CM

## 2024-05-09 DIAGNOSIS — E042 Nontoxic multinodular goiter: Secondary | ICD-10-CM

## 2024-05-09 DIAGNOSIS — Z87891 Personal history of nicotine dependence: Secondary | ICD-10-CM

## 2024-05-19 ENCOUNTER — Telehealth: Payer: Self-pay | Admitting: *Deleted

## 2024-05-19 NOTE — Telephone Encounter (Signed)
 Call report from [facility] Radiology:  IMPRESSION: 1. Enlarging lingular nodule, now 11.9 mm, highly worrisome for primary bronchogenic carcinoma. Lung-RADS 4B, suspicious. Additional imaging evaluation or consultation with Pulmonology or Thoracic Surgery recommended. These results will be called to the ordering clinician or representative by the Radiologist Assistant, and communication documented in the PACS or [employer]. 2. Cirrhosis. 3. Aortic atherosclerosis (ICD10-I70.0). Coronary artery calcification. 4. Enlarged pulmonic trunk, indicative of pulmonary arterial hypertension. 5.  Emphysema (ICD10-J43.9).

## 2024-05-23 ENCOUNTER — Telehealth: Payer: Self-pay | Admitting: Acute Care

## 2024-05-23 ENCOUNTER — Other Ambulatory Visit: Payer: Self-pay | Admitting: Acute Care

## 2024-05-23 DIAGNOSIS — R911 Solitary pulmonary nodule: Secondary | ICD-10-CM

## 2024-05-23 NOTE — Progress Notes (Signed)
 I have called the patient with the results of his low dose CT Chest. This was a 3 month follow up scan. His scan was read as a 4 B. He has a 11.9 mm pulmonary nodule in the lateral inferior lingula (4/169) has enlarged from 4.1 mm and is new from 11/09/2023. Other pulmonary nodules measure 15.4 mm. We discussed that the next best step in his plan of care  is for PET scan , and follow up to review the results 1 week after. He is in agreement with this plan.  He has a cardiac cath scheduled for 06/04/2024. We will try to get the PET scan done before. He will possibly need valve replacement after results of cath.   Please let PCP know plan of care, and fax results. Thanks so much

## 2024-05-23 NOTE — Telephone Encounter (Signed)
 See note 05/23/2024. Needs PET scan , which has been ordered and follow up with me 1 week after. Thanks so much

## 2024-05-26 NOTE — Telephone Encounter (Signed)
 Reminder set. PET ordered by provider. Results and plan to PCP.

## 2024-05-26 NOTE — Telephone Encounter (Signed)
 See provider note 05/23/2024-PET ordered.

## 2024-05-28 ENCOUNTER — Ambulatory Visit

## 2024-06-02 ENCOUNTER — Encounter (HOSPITAL_COMMUNITY)
Admission: RE | Admit: 2024-06-02 | Discharge: 2024-06-02 | Disposition: A | Discharge status: Home / Self Care | Source: Ambulatory Visit | Attending: Acute Care | Admitting: Acute Care

## 2024-06-02 ENCOUNTER — Telehealth: Payer: Self-pay | Admitting: *Deleted

## 2024-06-02 DIAGNOSIS — R911 Solitary pulmonary nodule: Secondary | ICD-10-CM | POA: Insufficient documentation

## 2024-06-02 LAB — GLUCOSE, CAPILLARY: Glucose-Capillary: 127 mg/dL — ABNORMAL HIGH (ref 70–99)

## 2024-06-02 MED ORDER — FLUDEOXYGLUCOSE F - 18 (FDG) INJECTION
11.9900 | Freq: Once | INTRAVENOUS | Status: AC
  Administered 2024-06-02: 11.99 via INTRAVENOUS

## 2024-06-02 NOTE — Telephone Encounter (Signed)
 Cardiac Catheterization scheduled at [facility] for: Wednesday June 04, 2024 10 AM Arrival time [facility] Main Entrance A at: 8 AM  Diet: -Nothing to eat after midnight.  Hydration: -May drink clear liquids until 2 hours before the procedure.  Approved liquids: Water , clear tea, black coffee, fruit juices-non-citric and without pulp,Gatorade, plain Jello/popsicles.   -Please drink 16 oz of water  2 hours before procedure.  Medication instructions: -Hold:  Eliquis -none 06/02/24 until post procedure  Lasix -AM of procedure -Other usual morning medications can be taken including aspirin  81 mg.  Plan to go home the same day, you will only stay overnight if medically necessary.  You must have responsible adult to drive you home.  Someone must be with you the first 24 hours after you arrive home.  Reviewed procedure instructions with patient's wife (DPR), Cathlean.

## 2024-06-04 ENCOUNTER — Other Ambulatory Visit (HOSPITAL_COMMUNITY): Payer: Self-pay

## 2024-06-04 ENCOUNTER — Other Ambulatory Visit: Payer: Self-pay

## 2024-06-04 ENCOUNTER — Encounter (HOSPITAL_COMMUNITY): Payer: Self-pay | Admitting: Cardiovascular Disease

## 2024-06-04 ENCOUNTER — Ambulatory Visit (HOSPITAL_COMMUNITY)
Admission: RE | Admit: 2024-06-04 | Discharge: 2024-06-04 | Disposition: A | Attending: Cardiovascular Disease | Admitting: Cardiovascular Disease

## 2024-06-04 ENCOUNTER — Encounter (HOSPITAL_COMMUNITY): Admission: RE | Disposition: A | Payer: Self-pay | Source: Home / Self Care | Attending: Cardiovascular Disease

## 2024-06-04 DIAGNOSIS — R9431 Abnormal electrocardiogram [ECG] [EKG]: Secondary | ICD-10-CM | POA: Diagnosis not present

## 2024-06-04 DIAGNOSIS — I2510 Atherosclerotic heart disease of native coronary artery without angina pectoris: Secondary | ICD-10-CM | POA: Diagnosis not present

## 2024-06-04 DIAGNOSIS — I350 Nonrheumatic aortic (valve) stenosis: Secondary | ICD-10-CM

## 2024-06-04 DIAGNOSIS — I472 Ventricular tachycardia, unspecified: Secondary | ICD-10-CM | POA: Diagnosis not present

## 2024-06-04 DIAGNOSIS — Z87891 Personal history of nicotine dependence: Secondary | ICD-10-CM | POA: Insufficient documentation

## 2024-06-04 DIAGNOSIS — Z955 Presence of coronary angioplasty implant and graft: Secondary | ICD-10-CM | POA: Insufficient documentation

## 2024-06-04 DIAGNOSIS — I493 Ventricular premature depolarization: Secondary | ICD-10-CM | POA: Diagnosis not present

## 2024-06-04 DIAGNOSIS — I480 Paroxysmal atrial fibrillation: Secondary | ICD-10-CM | POA: Diagnosis not present

## 2024-06-04 HISTORY — PX: CORONARY PRESSURE/FFR WITH 3D MAPPING: CATH118309

## 2024-06-04 HISTORY — PX: CORONARY STENT INTERVENTION: CATH118234

## 2024-06-04 HISTORY — PX: RIGHT HEART CATH AND CORONARY ANGIOGRAPHY: CATH118264

## 2024-06-04 LAB — POCT I-STAT EG7
Acid-base deficit: 1 mmol/L (ref 0.0–2.0)
Bicarbonate: 24.9 mmol/L (ref 20.0–28.0)
Calcium, Ion: 1.11 mmol/L — ABNORMAL LOW (ref 1.15–1.40)
HCT: 34 % — ABNORMAL LOW (ref 39.0–52.0)
Hemoglobin: 11.6 g/dL — ABNORMAL LOW (ref 13.0–17.0)
O2 Saturation: 63 %
Potassium: 2.8 mmol/L — ABNORMAL LOW (ref 3.5–5.1)
Sodium: 141 mmol/L (ref 135–145)
TCO2: 26 mmol/L (ref 22–32)
pCO2, Ven: 47 mmHg (ref 44–60)
pH, Ven: 7.333 (ref 7.25–7.43)
pO2, Ven: 35 mmHg (ref 32–45)

## 2024-06-04 LAB — POCT ACTIVATED CLOTTING TIME: Activated Clotting Time: 271 s

## 2024-06-04 SURGERY — RIGHT HEART CATH AND CORONARY ANGIOGRAPHY
Anesthesia: LOCAL

## 2024-06-04 MED ORDER — VERAPAMIL HCL 2.5 MG/ML IV SOLN
INTRAVENOUS | Status: AC
  Filled 2024-06-04: qty 2

## 2024-06-04 MED ORDER — TAMSULOSIN HCL 0.4 MG PO CAPS: 0.4000 mg | ORAL_CAPSULE | Freq: Every evening | ORAL | Status: DC

## 2024-06-04 MED ORDER — SODIUM CHLORIDE 0.9% FLUSH: 3.0000 mL | INTRAVENOUS | Status: DC | PRN

## 2024-06-04 MED ORDER — MIDAZOLAM HCL (PF) 2 MG/2ML IJ SOLN
INTRAMUSCULAR | Status: DC | PRN
  Administered 2024-06-04: 1 mg via INTRAVENOUS

## 2024-06-04 MED ORDER — CLOPIDOGREL BISULFATE 300 MG PO TABS
ORAL_TABLET | ORAL | Status: AC
  Filled 2024-06-04: qty 1

## 2024-06-04 MED ORDER — ASPIRIN 81 MG PO TBEC
81.0000 mg | DELAYED_RELEASE_TABLET | Freq: Every day | ORAL | 0 refills | Status: AC
  Filled 2024-06-04: qty 30, 30d supply, fill #0

## 2024-06-04 MED ORDER — FAMOTIDINE IN NACL 20-0.9 MG/50ML-% IV SOLN
INTRAVENOUS | Status: DC | PRN
  Administered 2024-06-04: 20 mg via INTRAVENOUS

## 2024-06-04 MED ORDER — ASPIRIN 81 MG PO CHEW: 81.0000 mg | CHEWABLE_TABLET | ORAL | Status: DC

## 2024-06-04 MED ORDER — CLOPIDOGREL BISULFATE 300 MG PO TABS
ORAL_TABLET | ORAL | Status: DC | PRN
  Administered 2024-06-04: 600 mg via ORAL

## 2024-06-04 MED ORDER — SODIUM CHLORIDE 0.9 % IV SOLN: 250.0000 mL | INTRAVENOUS | Status: DC | PRN

## 2024-06-04 MED ORDER — LIDOCAINE HCL (PF) 1 % IJ SOLN
INTRAMUSCULAR | Status: AC
  Filled 2024-06-04: qty 30

## 2024-06-04 MED ORDER — ASPIRIN 81 MG PO TBEC: 81.0000 mg | DELAYED_RELEASE_TABLET | Freq: Once | ORAL | Status: DC

## 2024-06-04 MED ORDER — IOHEXOL 350 MG/ML SOLN
INTRAVENOUS | Status: DC | PRN
  Administered 2024-06-04: 110 mL via INTRA_ARTERIAL

## 2024-06-04 MED ORDER — HEPARIN SODIUM (PORCINE) 1000 UNIT/ML IJ SOLN
INTRAMUSCULAR | Status: DC | PRN
  Administered 2024-06-04 (×2): 6000 [IU] via INTRAVENOUS

## 2024-06-04 MED ORDER — LATANOPROST 0.005 % OP SOLN
1.0000 [drp] | Freq: Every day | OPHTHALMIC | Status: DC
  Filled 2024-06-04: qty 2.5

## 2024-06-04 MED ORDER — SODIUM CHLORIDE 0.9% FLUSH: 3.0000 mL | Freq: Two times a day (BID) | INTRAVENOUS | Status: DC

## 2024-06-04 MED ORDER — ACETAMINOPHEN 325 MG PO TABS: 650.0000 mg | ORAL_TABLET | ORAL | Status: DC | PRN

## 2024-06-04 MED ORDER — FREE WATER: 500.0000 mL | Freq: Once | Status: DC

## 2024-06-04 MED ORDER — METOPROLOL TARTRATE 12.5 MG HALF TABLET: 12.5000 mg | ORAL_TABLET | Freq: Two times a day (BID) | ORAL | Status: DC

## 2024-06-04 MED ORDER — HEPARIN (PORCINE) IN NACL 1000-0.9 UT/500ML-% IV SOLN
INTRAVENOUS | Status: DC | PRN
  Administered 2024-06-04 (×2): 500 mL

## 2024-06-04 MED ORDER — FENTANYL CITRATE (PF) 100 MCG/2ML IJ SOLN
INTRAMUSCULAR | Status: AC
  Filled 2024-06-04: qty 2

## 2024-06-04 MED ORDER — ROSUVASTATIN CALCIUM 20 MG PO TABS: 20.0000 mg | ORAL_TABLET | Freq: Every day | ORAL | Status: DC

## 2024-06-04 MED ORDER — HEPARIN SODIUM (PORCINE) 1000 UNIT/ML IJ SOLN
INTRAMUSCULAR | Status: AC
  Filled 2024-06-04: qty 10

## 2024-06-04 MED ORDER — LIDOCAINE HCL (PF) 1 % IJ SOLN
INTRAMUSCULAR | Status: DC | PRN
  Administered 2024-06-04 (×2): 2 mL

## 2024-06-04 MED ORDER — CLOPIDOGREL BISULFATE 75 MG PO TABS
75.0000 mg | ORAL_TABLET | Freq: Every day | ORAL | 1 refills | Status: AC
  Filled 2024-06-04: qty 90, 90d supply, fill #0

## 2024-06-04 MED ORDER — FENTANYL CITRATE (PF) 100 MCG/2ML IJ SOLN
INTRAMUSCULAR | Status: DC | PRN
  Administered 2024-06-04: 25 ug via INTRAVENOUS

## 2024-06-04 MED ORDER — FAMOTIDINE IN NACL 20-0.9 MG/50ML-% IV SOLN
INTRAVENOUS | Status: AC
  Filled 2024-06-04: qty 50

## 2024-06-04 MED ORDER — VERAPAMIL HCL 2.5 MG/ML IV SOLN
INTRAVENOUS | Status: DC | PRN
  Administered 2024-06-04: 10 mL via INTRA_ARTERIAL

## 2024-06-04 MED ORDER — SODIUM CHLORIDE 0.9 % IV SOLN
INTRAVENOUS | Status: DC | PRN
  Administered 2024-06-04: 10 mL/h via INTRAVENOUS

## 2024-06-04 MED ORDER — LABETALOL HCL 5 MG/ML IV SOLN: 10.0000 mg | INTRAVENOUS | Status: DC | PRN

## 2024-06-04 MED ORDER — LORATADINE 10 MG PO TABS: 10.0000 mg | ORAL_TABLET | Freq: Every day | ORAL | Status: DC

## 2024-06-04 MED ORDER — MIDAZOLAM HCL 2 MG/2ML IJ SOLN
INTRAMUSCULAR | Status: AC
  Filled 2024-06-04: qty 2

## 2024-06-04 MED ORDER — FUROSEMIDE 20 MG PO TABS: 60.0000 mg | ORAL_TABLET | Freq: Two times a day (BID) | ORAL | Status: DC

## 2024-06-04 MED ORDER — HYDRALAZINE HCL 20 MG/ML IJ SOLN: 10.0000 mg | INTRAMUSCULAR | Status: DC | PRN

## 2024-06-04 MED ORDER — NITROGLYCERIN 0.4 MG SL SUBL
0.4000 mg | SUBLINGUAL_TABLET | SUBLINGUAL | 2 refills | Status: AC | PRN
  Filled 2024-06-04: qty 25, 5d supply, fill #0

## 2024-06-04 MED ORDER — CLOPIDOGREL BISULFATE 75 MG PO TABS: 75.0000 mg | ORAL_TABLET | Freq: Every day | ORAL | Status: DC

## 2024-06-04 SURGICAL SUPPLY — 18 items
BALLOON EMERGE MR 2.0X12 (BALLOONS) IMPLANT
BALLOON ~~LOC~~ EMERGE MR 3.75X12 (BALLOONS) IMPLANT
CARD KEY FFR CATHWORX (MISCELLANEOUS) IMPLANT
CATH 5FR JL3.5 JR4 ANG PIG MP (CATHETERS) IMPLANT
CATH BALLN WEDGE 5F 110CM (CATHETERS) IMPLANT
CATH LAUNCHER 6FR AL.75 (CATHETERS) IMPLANT
DEVICE RAD COMP TR BAND LRG (VASCULAR PRODUCTS) IMPLANT
GLIDESHEATH SLEND SS 6F .021 (SHEATH) IMPLANT
GUIDEWIRE .025 260CM (WIRE) IMPLANT
GUIDEWIRE INQWIRE 1.5J.035X260 (WIRE) IMPLANT
KIT ENCORE 26 ADVANTAGE (KITS) IMPLANT
KIT SINGLE USE MANIFOLD (KITS) IMPLANT
PACK CARDIAC CATHETERIZATION (CUSTOM PROCEDURE TRAY) ×1 IMPLANT
SET ATX-X65L (MISCELLANEOUS) IMPLANT
SHEATH GLIDE SLENDER 4/5FR (SHEATH) IMPLANT
STENT SYNERGY XD 3.50X16 (Permanent Stent) IMPLANT
STENT SYNERGY XD 3.50X24 (Permanent Stent) IMPLANT
WIRE ASAHI PROWATER 180CM (WIRE) IMPLANT

## 2024-06-04 NOTE — Progress Notes (Signed)
 CARDIAC REHAB PHASE I     Post stent education including site care, restrictions, risk factors, exercise guidelines, NTG use, antiplatelet therapy importance, heart diet, smoking cessation and CRP2 reviewed. All questions and concerns addressed. Will refer to [facility] for CRP2. Plan for home later today.    [phone] Derrick JAYSON [place], RN BSN 06/04/2024 12:51 PM

## 2024-06-04 NOTE — Progress Notes (Signed)
 Regular diet given to patient, patient tolerated without emesis. Discharge instructions reviewed with patient and his wife, both verbalized understanding. Reiterated the importance of keeping right hand/wrist next to his side - Do not use for 24 hours - pt. And spouse verbalized understanding. During recovery the patient continues to move his right wrist (unaware) - even with guided instructions. Nurse will continue to encourage patient to not use right hand for any reason during the next 24 hours.

## 2024-06-04 NOTE — Progress Notes (Signed)
 Patient up to BR for void, wife at the bedside to assist. Positive for void. Steady gait.

## 2024-06-04 NOTE — Discharge Summary (Signed)
 "   Discharge Summary for Same Day PCI   Patient ID: Derrick Alexander MRN: [id number]; DOB: 1945/10/26  Admit date: 06/04/2024 Discharge date: 06/04/2024  Primary Care Provider: Clarice Nottingham, MD  Primary Cardiologist: Vinie JAYSON Maxcy, MD  Primary Electrophysiologist:  Soyla Gladis Norton, MD   Discharge Diagnoses    Active Problems:   CAD (coronary artery disease)  Diagnostic Studies/Procedures    Cardiac Catheterization 06/04/2024:    Prox RCA-1 lesion is 30% stenosed.   Prox RCA-2 lesion is 80% stenosed.   Prox RCA to Mid RCA lesion is 60% stenosed.   Mid LM lesion is 30% stenosed.   Dist Cx lesion is 50% stenosed.   Prox Cx lesion is 30% stenosed.   Prox LAD to Mid LAD lesion is 30% stenosed.   A drug-eluting stent was successfully placed using a STENT SYNERGY XD 3.50X24.   Post intervention, there is a 0% residual stenosis.   Post intervention, there is a 0% residual stenosis.   Mild non-obstructive disease in the distal left main, proximal LAD and mid Circumflex. Moderate non-obstructive disease in the distal OM branch Large dominant RCA with severe mid vessel stenosis Successful PTCA/DES x 1 mid RCA Elevated right heart pressures.    Recommendations: ASA/Plavix  for one month then stop ASA. Restart Eliquis  tomorrow. Increase Lasix  to 80 mg po BID for one week then resume 60 mg po BID. Same day post PCI discharge.   Diagnostic Dominance: Right  Intervention   _____________   History of Present Illness     Derrick Alexander is a 79 y.o. male with PMH of paroxsymal atrial fibrillation, HTN, hx of GI bleed, aortic stenosis who is followed by Dr. Maxcy as an outpatient. Most recently seen in the office 05/06/2024 for follow up regarding echo from 03/2024 that showed severe aortic stenosis. He was noted to be symptomatic with shortness of breath and worsening fatigue, therefore was set up for outpatient L/RHC.   Hospital Course     The patient underwent cardiac cath as noted  above with pRCA with 80% stenosis treated with PCI/DES x1. Plan for ASA/plavix /eliquis  for at least one month, then stop ASA with continuation of plavix  and eliquis . The patient was seen by cardiac rehab while in short stay. There were no observed complications post cath. Radial cath site was re-evaluated prior to discharge and found to be stable without any complications. Instructions/precautions regarding cath site care were given prior to discharge.  Derrick Alexander was seen by Dr. Verlin and determined stable for discharge home. Follow up with our office has been arranged. Medications are listed below. Pertinent changes include plavix , SL NTG.  _____________  Cath/PCI Registry Performance & Quality Measures: Aspirin  prescribed? - Yes ADP Receptor Inhibitor (Plavix /Clopidogrel , Brilinta/Ticagrelor or Effient/Prasugrel) prescribed (includes medically managed patients)? - Yes High Intensity Statin (Lipitor 40-80mg  or Crestor  20-40mg ) prescribed? - Yes For EF <40%, was ACEI/ARB prescribed? - Not Applicable (EF >/= 40%) For EF <40%, Aldosterone Antagonist (Spironolactone  or Eplerenone) prescribed? - Not Applicable (EF >/= 40%) Cardiac Rehab Phase II ordered (Included Medically managed Patients)? - Yes  _____________  Discharge Vitals Blood pressure 136/80, pulse 62, temperature 98.3 F (36.8 C), resp. rate 19, height 5' 6 (1.676 m), weight 112.5 kg, SpO2 98%.  Filed Weights   06/04/24 0831  Weight: 112.5 kg    Last Labs & Radiologic Studies    CBC No results for input(s): WBC, NEUTROABS, HGB, HCT, MCV, PLT in the last 72 hours. Basic Metabolic Panel No results for  input(s): NA, K, CL, CO2, GLUCOSE, BUN, CREATININE, CALCIUM , MG, PHOS in the last 72 hours. Liver Function Tests No results for input(s): AST, ALT, ALKPHOS, BILITOT, PROT, ALBUMIN in the last 72 hours. No results for input(s): LIPASE, AMYLASE in the last 72 hours. High  Sensitivity Troponin:   No results for input(s): TROPONINIHS in the last 720 hours.  BNP Invalid input(s): POCBNP D-Dimer No results for input(s): DDIMER in the last 72 hours. Hemoglobin A1C No results for input(s): HGBA1C in the last 72 hours. Fasting Lipid Panel No results for input(s): CHOL, HDL, LDLCALC, TRIG, CHOLHDL, LDLDIRECT in the last 72 hours. Thyroid  Function Tests No results for input(s): TSH, T4TOTAL, T3FREE, THYROIDAB in the last 72 hours.  Invalid input(s): FREET3 _____________  CARDIAC CATHETERIZATION Result Date: 06/04/2024   Prox RCA-1 lesion is 30% stenosed.   Prox RCA-2 lesion is 80% stenosed.   Prox RCA to Mid RCA lesion is 60% stenosed.   Mid LM lesion is 30% stenosed.   Dist Cx lesion is 50% stenosed.   Prox Cx lesion is 30% stenosed.   Prox LAD to Mid LAD lesion is 30% stenosed.   A drug-eluting stent was successfully placed using a STENT SYNERGY XD 3.50X24.   Post intervention, there is a 0% residual stenosis.   Post intervention, there is a 0% residual stenosis. Mild non-obstructive disease in the distal left main, proximal LAD and mid Circumflex. Moderate non-obstructive disease in the distal OM branch Large dominant RCA with severe mid vessel stenosis Successful PTCA/DES x 1 mid RCA Elevated right heart pressures. Recommendations: ASA/Plavix  for one month then stop ASA. Restart Eliquis  tomorrow. Increase Lasix  to 80 mg po BID for one week then resume 60 mg po BID. Same day post PCI discharge.   CT CHEST LCS NODULE F/U LOW DOSE WO CONTRAST Result Date: 05/19/2024 CLINICAL DATA:  Abnormal lung cancer screening CT. Former 76 pack-year smoker, quit 5 years ago. EXAM: CT CHEST WITHOUT CONTRAST FOR LUNG CANCER SCREENING NODULE FOLLOW-UP TECHNIQUE: Multidetector CT imaging of the chest was performed following the standard protocol without IV contrast. RADIATION DOSE REDUCTION: This exam was performed according to the departmental  dose-optimization program which includes automated exposure control, adjustment of the mA and/or kV according to patient size and/or use of iterative reconstruction technique. COMPARISON:  11/09/2023. FINDINGS: Cardiovascular: Atherosclerotic calcification of the aorta, aortic valve and coronary arteries. Enlarged pulmonic trunk and heart. No pericardial effusion. Mediastinum/Nodes: No pathologically enlarged mediastinal or axillary lymph nodes. Hilar regions are difficult to definitively evaluate without IV contrast. Moderate bilateral gynecomastia. Air in the esophagus can be seen with dysmotility. Lungs/Pleura: Image quality is degraded by respiratory motion. Centrilobular and paraseptal emphysema. 11.9 mm nodule in the lateral inferior lingula (4/169) has enlarged from 4.1 mm and is new from 11/09/2023. Other pulmonary nodules measure 15.4 mm or less in size, as before. No pleural fluid. Airway is unremarkable. Upper Abdomen: Liver margin is slightly irregular. Visualized portions of the liver, gallbladder, adrenal glands, kidneys, spleen, pancreas, stomach and bowel are otherwise grossly unremarkable. No upper abdominal adenopathy. Musculoskeletal: Degenerative changes in the spine. Old T8 compression fracture. IMPRESSION: 1. Enlarging lingular nodule, now 11.9 mm, highly worrisome for primary bronchogenic carcinoma. Lung-RADS 4B, suspicious. Additional imaging evaluation or consultation with Pulmonology or Thoracic Surgery recommended. These results will be called to the ordering clinician or representative by the Radiologist Assistant, and communication documented in the PACS or [employer]. 2. Cirrhosis. 3. Aortic atherosclerosis (ICD10-I70.0). Coronary artery calcification. 4. Enlarged pulmonic trunk,  indicative of pulmonary arterial hypertension. 5.  Emphysema (ICD10-J43.9). Electronically Signed   By: Newell Eke M.D.   On: 05/19/2024 13:03   US  THYROID  Result Date: 05/10/2024 EXAM: US   THYROID  05/09/2024 11:03:00 AM TECHNIQUE: Real-time ultrasound scan of the thyroid  gland and soft tissues of the neck with image documentation. COMPARISON: 04/20/2023. CLINICAL HISTORY: MULTINODULAR THYROID . Previous FNA biopsy of superior left nodule 10/18/2022. FINDINGS: Right thyroid  lobe: 7.7 x 3.7 x 3.6 cm, previously 6.9 x 3.8 x 4.1 cm. Left thyroid  lobe: 7.9 x 4.0 x 2.8 cm, previously 7.8 x 4.1 x 4.3 cm. Isthmus: 1.2 cm thickness, previously 1.3 cm. Echotexture: Parenchymal echotexture moderately heterogenous. Vascularity: Normal Vascularity. Thyroid  Nodules: There are 2 nodules greater than 1 cm. Nodule 1 Location - Superior right Size - 0.8 cm Composition - Mixed cystic and solid (1 pt) Echogenicity - Not specified Shape - Wider than tall (0 pts) Margins - Smooth (0 pts) Echogenic Foci - None (0 pts) TI-RADS Points - 1 TI-RADS Category - TI-RADS 2 Recommendation: No follow up required. Nodule 2 Location - Superior right Size - 0.9 x 0.8 x 0.7 cm Composition - Solid (2 pts) Echogenicity - Hypoechoic (2 pts) Shape - Wider than tall (0 pts) Margins - Indistinct (0 pts) Echogenic Foci - None (0 pts) TI-RADS Points - 4 TI-RADS Category - TI-RADS 4 Nodule 3 Location - Mid posterior right Size - 1.2 x 0.8 x 0.8 cm Composition - Solid (2 pts) Echogenicity - Hypoechoic (2 pts) Shape - Wider than tall (0 pts) Margins - Smooth (0 pts) Echogenic Foci - None (0 pts) TI-RADS Points - 4 TI-RADS Category - TI-RADS 4 Nodule 4 Location - Superior left Size - 1.9 x 1.8 cm Composition - Solid (2 pts) Echogenicity - Isoechoic (1 pt) Shape - Wider than tall (0 pts) Margins - Indistinct (0 pts) Echogenic Foci - None (0 pts) TI-RADS Points - 3 TI-RADS Category - TI-RADS 3 Change from prior - Previously 1.7 x 1.5 cm. Prior Biopsy Result - Previous FNA biopsy 10/18/2022. Soft tissues: No regional cervical adenopathy identified. IMPRESSION: 1. Superior right thyroid  nodule, 0.8 cm, TI-RADS TR2 no follow-up required. 2. Superior right  thyroid  nodule, 0.9 x 0.8 x 0.7 cm, TI-RADS TR4 no follow-up required. 3. Mid posterior right thyroid  nodule, 1.2 x 0.8 x 0.8 cm, TI-RADS TR2 no follow-up required. 4. Superior left thyroid  nodule, 1.9 x 1.8 cm, TI-RADS TR3, no follow up needed presuming negative cytology on previous biopsy. Electronically signed by: Dayne Hassell MD 05/10/2024 11:14 AM EST RP Workstation: [id number]    Disposition   Pt is being discharged home today in good condition.  Follow-up Plans & Appointments     Discharge Instructions     Amb Referral to Cardiac Rehabilitation   Complete by: As directed    Diagnosis: Coronary Stents   After initial evaluation and assessments completed: Virtual Based Care may be provided alone or in conjunction with Phase 2 Cardiac Rehab based on patient barriers.: Yes   Intensive Cardiac Rehabilitation (ICR) [facility] location only OR Traditional Cardiac Rehabilitation (TCR) *If criteria for ICR are not met will enroll in TCR [facility] only): Yes        Discharge Medications   Allergies as of 06/04/2024       Reactions   Cardizem  [diltiazem ] Shortness Of Breath   Pacerone  [amiodarone ] Shortness Of Breath, Nausea Only, Other (See Comments)   Abdominal pain Dizziness   Tambocor  [flecainide ] Shortness Of Breath  Medication List     TAKE these medications    acetaminophen  325 MG tablet Commonly known as: TYLENOL  Take 2 tablets (650 mg total) by mouth every 6 (six) hours as needed for mild pain (or Fever >/= 101).   albuterol  108 (90 Base) MCG/ACT inhaler Commonly known as: VENTOLIN  HFA Inhale 2 puffs into the lungs every 6 (six) hours as needed for wheezing or shortness of breath.   albuterol  (2.5 MG/3ML) 0.083% nebulizer solution Commonly known as: PROVENTIL  Take 3 mLs (2.5 mg total) by nebulization every 4 (four) hours as needed for wheezing or shortness of breath.   aspirin  EC 81 MG tablet Take 1 tablet (81 mg total) by mouth daily. Swallow whole. Start  taking on: June 05, 2024 What changed: when to take this   cetirizine 10 MG tablet Commonly known as: ZYRTEC Take 10 mg by mouth every evening.   clopidogrel  75 MG tablet Commonly known as: PLAVIX  Take 1 tablet (75 mg total) by mouth daily with breakfast. Start taking on: June 05, 2024   Eliquis  5 MG Tabs tablet Generic drug: apixaban  TAKE 1 TABLET(5 MG) BY MOUTH TWICE DAILY.   fluticasone -salmeterol 250-50 MCG/ACT Aepb Commonly known as: Wixela Inhub Inhale 1 puff into the lungs in the morning and at bedtime.   furosemide  40 MG tablet Commonly known as: LASIX  TAKE 1 AND 1/2 TABLETS(60 MG) BY MOUTH TWICE DAILY   latanoprost  0.005 % ophthalmic solution Commonly known as: XALATAN  Place 1 drop into both eyes at bedtime.   methimazole 5 MG tablet Commonly known as: TAPAZOLE Take 5 mg by mouth See admin instructions. Mon-Fri   metoprolol  tartrate 25 MG tablet Commonly known as: LOPRESSOR  TAKE 1/2 TABLET(12.5 MG) BY MOUTH TWICE DAILY   nitroGLYCERIN  0.4 MG SL tablet Commonly known as: Nitrostat  Place 1 tablet (0.4 mg total) under the tongue every 5 (five) minutes as needed.   rosuvastatin  20 MG tablet Commonly known as: CRESTOR  Take 20 mg by mouth daily.   SYSTANE OP Place 1 drop into both eyes daily as needed (dry eyes).   tamsulosin  0.4 MG Caps capsule Commonly known as: FLOMAX  Take 1 capsule by mouth every evening.        Allergies Allergies[1]  Outstanding Labs/Studies   N/a   Duration of Discharge Encounter   Greater than 30 minutes including physician time.  Signed, Manuelita Rummer, NP 06/04/2024, 1:24 PM     [1]  Allergies Allergen Reactions   Cardizem  [Diltiazem ] Shortness Of Breath   Pacerone  [Amiodarone ] Shortness Of Breath, Nausea Only and Other (See Comments)    Abdominal pain Dizziness   Tambocor  [Flecainide ] Shortness Of Breath   "

## 2024-06-04 NOTE — Interval H&P Note (Signed)
 History and Physical Interval Note:  06/04/2024 9:42 AM  Derrick Alexander  has presented today for surgery, with the diagnosis of aortic stenosis.  The various methods of treatment have been discussed with the patient and family. After consideration of risks, benefits and other options for treatment, the patient has consented to  Procedures: RIGHT/LEFT HEART CATH AND CORONARY ANGIOGRAPHY (N/A) as a surgical intervention.  The patient's history has been reviewed, patient examined, no change in status, stable for surgery.  I have reviewed the patient's chart and labs.  Questions were answered to the patient's satisfaction.    Cath Lab Visit (complete for each Cath Lab visit)  Clinical Evaluation Leading to the Procedure:   ACS: No.  Non-ACS:    Anginal Classification: CCS II  Anti-ischemic medical therapy: Minimal Therapy (1 class of medications)  Non-Invasive Test Results: No non-invasive testing performed  Prior CABG: No previous CABG        Derrick Alexander

## 2024-06-04 NOTE — Discharge Instructions (Addendum)
 Drink plenty of fluids for 48 hours and keep wrist elevated at heart level for 24 hours  Radial Site Care   This sheet gives you information about how to care for yourself after your procedure. Your health care provider may also give you more specific instructions. If you have problems or questions, contact your health care provider. What can I expect after the procedure? After the procedure, it is common to have: Bruising and tenderness at the catheter insertion area. Follow these instructions at home: Medicines Take over-the-counter and prescription medicines only as told by your health care provider. Insertion site care Follow instructions from your health care provider about how to take care of your insertion site. Make sure you: Wash your hands with soap and water  before you change your bandage (dressing). If soap and water  are not available, use hand sanitizer. Remove your dressing as told by your health care provider. In 24 hours Check your insertion site every day for signs of infection. Check for: Redness, swelling, or pain. Fluid or blood. Pus or a bad smell. Warmth. Do not take baths, swim, or use a hot tub until your health care provider approves. You may shower 24-48 hours after the procedure, or as directed by your health care provider. Remove the dressing and gently wash the site with plain soap and water . Pat the area dry with a clean towel. Do not rub the site. That could cause bleeding. Do not apply powder or lotion to the site. Activity   For 24 hours after the procedure, or as directed by your health care provider: Do not flex or bend the affected arm. Do not push or pull heavy objects with the affected arm. Do not drive yourself home from the hospital or clinic. You may drive 24 hours after the procedure unless your health care provider tells you not to. Do not operate machinery or power tools. Do not lift anything that is heavier than 10 lb (4.5 kg), or the  limit that you are told, until your health care provider says that it is safe.  For 4 days Ask your health care provider when it is okay to: Return to work or school. Resume usual physical activities or sports. Resume sexual activity. General instructions If the catheter site starts to bleed, raise your arm and put firm pressure on the site. If the bleeding does not stop, get help right away. This is a medical emergency. If you went home on the same day as your procedure, a responsible adult should be with you for the first 24 hours after you arrive home. Keep all follow-up visits as told by your health care provider. This is important. Contact a health care provider if: You have a fever. You have redness, swelling, or yellow drainage around your insertion site. Get help right away if: You have unusual pain at the radial site. The catheter insertion area swells very fast. The insertion area is bleeding, and the bleeding does not stop when you hold steady pressure on the area. Your arm or hand becomes pale, cool, tingly, or numb. These symptoms may represent a serious problem that is an emergency. Do not wait to see if the symptoms will go away. Get medical help right away. Call your local emergency services (911 in the [place] Do not drive yourself to the hospital. Summary After the procedure, it is common to have bruising and tenderness at the site. Follow instructions from your health care provider about how to take care of your  radial site wound. Check the wound every day for signs of infection. Do not lift anything that is heavier than 10 lb (4.5 kg), or the limit that you are told, until your health care provider says that it is safe. This information is not intended to replace advice given to you by your health care provider. Make sure you discuss any questions you have with your health care provider. Document Revised: 06/13/2017 Document Reviewed: 06/13/2017 Elsevier Patient Education   2020 [employer].   Information about your medication: Plavix (anti-platelet agent)  Generic Name (Brand): clopidogrel (Plavix), once daily medication  PURPOSE: You are taking this medication along with aspirin to lower your chance of having a heart attack, stroke, or blood clots in your heart stent. These can be fatal. Plavix and aspirin help prevent platelets from sticking together and forming a clot that can block an artery or your stent.   Common SIDE EFFECTS you may experience include: bruising or bleeding more easily, shortness of breath  Do not stop taking PLAVIX without talking to the doctor who prescribes it for you. People who are treated with a stent and stop taking Plavix too soon, have a higher risk of getting a blood clot in the stent, having a heart attack, or dying. If you stop Plavix because of bleeding, or for other reasons, your risk of a heart attack or stroke may increase.   Tell all of your doctors and dentists that you are taking Plavix. They should talk to the doctor who prescribed Plavix for you before you have any surgery or invasive procedure.   Contact your health care provider if you experience: severe or uncontrollable bleeding, pink/red/brown urine, vomiting blood or vomit that looks like coffee grounds, red or black stools (looks like tar), coughing up blood or blood clots ----------------------------------------------------------------------------------------------------------------------

## 2024-06-05 LAB — POCT I-STAT 7, (LYTES, BLD GAS, ICA,H+H)
Acid-base deficit: 1 mmol/L (ref 0.0–2.0)
Bicarbonate: 24.5 mmol/L (ref 20.0–28.0)
Calcium, Ion: 1.1 mmol/L — ABNORMAL LOW (ref 1.15–1.40)
HCT: 35 % — ABNORMAL LOW (ref 39.0–52.0)
Hemoglobin: 11.9 g/dL — ABNORMAL LOW (ref 13.0–17.0)
O2 Saturation: 91 %
Potassium: 2.9 mmol/L — ABNORMAL LOW (ref 3.5–5.1)
Sodium: 143 mmol/L (ref 135–145)
TCO2: 26 mmol/L (ref 22–32)
pCO2 arterial: 43.1 mmHg (ref 32–48)
pH, Arterial: 7.362 (ref 7.35–7.45)
pO2, Arterial: 64 mmHg — ABNORMAL LOW (ref 83–108)

## 2024-06-13 ENCOUNTER — Ambulatory Visit: Admitting: Acute Care

## 2024-06-13 ENCOUNTER — Encounter: Payer: Self-pay | Admitting: Acute Care

## 2024-06-13 VITALS — BP 135/75 | HR 46 | Temp 97.7°F | Ht 66.0 in | Wt 236.0 lb

## 2024-06-13 DIAGNOSIS — G4733 Obstructive sleep apnea (adult) (pediatric): Secondary | ICD-10-CM | POA: Diagnosis not present

## 2024-06-13 DIAGNOSIS — R9389 Abnormal findings on diagnostic imaging of other specified body structures: Secondary | ICD-10-CM

## 2024-06-13 DIAGNOSIS — R942 Abnormal results of pulmonary function studies: Secondary | ICD-10-CM

## 2024-06-13 DIAGNOSIS — R911 Solitary pulmonary nodule: Secondary | ICD-10-CM

## 2024-06-13 DIAGNOSIS — Z87891 Personal history of nicotine dependence: Secondary | ICD-10-CM | POA: Diagnosis not present

## 2024-06-13 DIAGNOSIS — J449 Chronic obstructive pulmonary disease, unspecified: Secondary | ICD-10-CM | POA: Diagnosis not present

## 2024-06-13 NOTE — Patient Instructions (Addendum)
 It is good to see you today. We have reviewed your PET scan results. The lingular nodule was positive on PET scan. We would recommend a biopsy, but with your cardiac procedures will not allow us  to stop your Plavix  and aspirin . I will refer you to radiation oncology to be evaluated for treatment with SBRT without tissue diagnosis. I will also speak with one of the IP physician to see if we should get a blood test to help determine if this is a cancer.  You will get a call to schedule the consult with the radiation oncologist. Monitor for weight loss and call if you cough up any blood.  Please contact office for sooner follow up if symptoms do not improve or worsen or seek emergency care

## 2024-06-13 NOTE — Progress Notes (Signed)
 "  History of Present Illness Derrick Alexander is a 79 y.o. male former smoker followed through the lung cancer screening program. He was referred to pulmonary 05/2024 after an abnormal lung cancer screening scan. He will be followed by the IP Team. He is followed by Dr. Slater Staff for COPD, and OSA.    06/13/2024 Discussed the use of AI scribe software for clinical note transcription with the patient, who gave verbal consent to proceed.  History of Present Illness Pt. Presents for follow up after PET scan to further evaluate an abnormal lung cancer screening scan.  Derrick Alexander is a 79 year old male with lung nodules who presents for evaluation of an enlarging lung nodule.  He underwent a lung cancer screening scan in December 2025 , which revealed an enlarging nodule. A subsequent PET scan on January 12 showed a lingular node with uptake ( SUV of 3.3)  and a ground glass nodule with low uptake. The lingular nodule increased in size from 4.1 mm in June to 11.9 mm by January 2026, almost tripling in size in 6 months. We discussed that the next best step is for a biopsy to further evaluate the nodule.  He had a cardiac stent placed on January 14 , 2026 and is awaiting aortic valve replacement ( TAVR) . Due to these cardiac procedures, he is currently on blood thinners, including Plavix  for at least six months and aspirin  for 30 days, complicating the possibility of performing a biopsy on the lung nodule. He has a history of atrial fibrillation and is on Eliquis , which can be held for 48 hours if necessary.  He has not experienced significant weight loss, though he did lose a few pounds during a recent hospital stay for the stent placement, which he attributes to a decreased appetite during that time. No cough or hemoptysis.  Because there is not going to be a good option for biopsy in the near future due to the inability to hold plavix  ,  I do not feel comfortable with a long delay as the nodule has  increased in size from 4.1 mm to 11.9 mm in 6 months. I will refer to radiation oncology for consideration of treatment without tissue sampling. I will also see if there would be an option to do the biopsy at the time of the TAVR,as I feel sure the plavix  will need to be held for that procedure.   I will also send blood for Nodify sampling.  Pt. And his wife are in agreement with this plan.   Pt. Has COPD which is managed with Wixella. He rarely uses his rescue inhaler. He also has moderate OSA ( AHI of 15.9). Currently on CPAP therapy.   Test Results: PET scan 06/02/2024 1.2 cm lingular nodule with maximum SUV of 3.3, suspicious for malignancy. 2. 1.2 cm ground-glass nodule in the right upper lobe with maximum SUV of 1.0, indeterminate. 3. Systemic atherosclerosis, including thoracic aortic, coronary artery, aortic, and iliac arterial calcifications. 4. Mild cardiomegaly. 5. Mild sigmoid colon diverticulosis. 6. Prostatomegaly. 7. Bilateral gynecomastia.  LDCT 05/09/2024 Enlarging lingular nodule, now 11.9 mm, highly worrisome for primary bronchogenic carcinoma. Lung-RADS 4B, suspicious. Additional imaging evaluation or consultation with Pulmonology or Thoracic Surgery recommended. These results will be called to the ordering clinician or representative by the Radiologist Assistant, and communication documented in the PACS or [employer]. 2. Cirrhosis. 3. Aortic atherosclerosis (ICD10-I70.0). Coronary artery calcification. 4. Enlarged pulmonic trunk, indicative of pulmonary arterial hypertension. 5.  Emphysema (  ICD10-J43.9).    Latest Ref Rng & Units 06/04/2024   10:32 AM 06/04/2024   10:31 AM 05/06/2024   10:20 AM  CBC  WBC 3.4 - 10.8 x10E3/uL   9.0   Hemoglobin 13.0 - 17.0 g/dL 88.3  88.0  85.5   Hematocrit 39.0 - 52.0 % 34.0  35.0  44.6   Platelets 150 - 450 x10E3/uL   186        Latest Ref Rng & Units 06/04/2024   10:32 AM 06/04/2024   10:31 AM 05/06/2024   10:20  AM  BMP  Glucose 70 - 99 mg/dL   881   BUN 8 - 27 mg/dL   13   Creatinine 9.23 - 1.27 mg/dL   8.89   BUN/Creat Ratio 10 - 24   12   Sodium 135 - 145 mmol/L 141  143  142   Potassium 3.5 - 5.1 mmol/L 2.8  2.9  4.0   Chloride 96 - 106 mmol/L   100   CO2 20 - 29 mmol/L   27   Calcium  8.6 - 10.2 mg/dL   89.8     BNP    Component Value Date/Time   BNP 522.0 (H) 07/07/2022 0932    ProBNP    Component Value Date/Time   PROBNP 756 (H) 05/05/2022 0956    PFT    Component Value Date/Time   FEV1PRE 1.70 08/01/2017 0842   FEV1POST 1.73 08/01/2017 0842   FVCPRE 2.39 08/01/2017 0842   FVCPOST 2.38 08/01/2017 0842   TLC 5.84 08/01/2017 0842   DLCOUNC 23.19 08/01/2017 0842   PREFEV1FVCRT 71 08/01/2017 0842   PSTFEV1FVCRT 73 08/01/2017 0842    NM PET Image Initial (PI) Skull Base To Thigh (F-18 FDG) Result Date: 06/06/2024 EXAM: PET AND CT SKULL BASE TO MID THIGH 06/02/2024 10:12:21 AM TECHNIQUE: RADIOPHARMACEUTICAL: 11.99 mCi F-18 FDG Uptake time 60 minutes. Glucose level 127 mg/dl. Blood pool SUV 2.6. PET imaging was acquired from the base of the skull to the mid thighs. Non-contrast enhanced computed tomography was obtained for attenuation correction and anatomic localization. COMPARISON: Chest CT 05/09/2024. CLINICAL HISTORY: Lung nodule, 6-8 mm. FINDINGS: HEAD AND NECK: No metabolically active cervical lymphadenopathy. CHEST: 1.2 cm lingular nodule on image 38 series 8 has a maximum SUV of 3.3, suspicious for malignancy. 1.2 cm in diameter ground glass density nodule posteriorly in the right upper lobe on image 26 series 7, maximum SUV 1.0. Mild cardiomegaly. Bilateral gynecomastia. No metabolically active lymphadenopathy. ABDOMEN AND PELVIS: Mild sigmoid colon diverticulosis. Prostatomegaly. No metabolically active intraperitoneal mass. No metabolically active lymphadenopathy. Physiologic activity within the gastrointestinal and genitourinary systems. BONES AND SOFT TISSUE: No abnormal  FDG activity localizes to the bones. No metabolically active aggressive osseous lesion. VASCULATURE: Pelvic common carotid atheromatous vascular calcification. Thoracic aortic, coronary artery, and branch vessel atheromatous vascular calcifications. Systemic atherosclerosis is present, including the aorta and iliac arteries. IMPRESSION: 1. 1.2 cm lingular nodule with maximum SUV of 3.3, suspicious for malignancy. 2. 1.2 cm ground-glass nodule in the right upper lobe with maximum SUV of 1.0, indeterminate. 3. Systemic atherosclerosis, including thoracic aortic, coronary artery, aortic, and iliac arterial calcifications. 4. Mild cardiomegaly. 5. Mild sigmoid colon diverticulosis. 6. Prostatomegaly. 7. Bilateral gynecomastia. Electronically signed by: Ryan Salvage MD 06/06/2024 05:09 PM EST RP Workstation: [id number]   CARDIAC CATHETERIZATION Result Date: 06/04/2024   Prox RCA-1 lesion is 30% stenosed.   Prox RCA-2 lesion is 80% stenosed.   Prox RCA to Mid RCA lesion is 60% stenosed.  Mid LM lesion is 30% stenosed.   Dist Cx lesion is 50% stenosed.   Prox Cx lesion is 30% stenosed.   Prox LAD to Mid LAD lesion is 30% stenosed.   A drug-eluting stent was successfully placed using a STENT SYNERGY XD 3.50X24.   Post intervention, there is a 0% residual stenosis.   Post intervention, there is a 0% residual stenosis. Mild non-obstructive disease in the distal left main, proximal LAD and mid Circumflex. Moderate non-obstructive disease in the distal OM branch Large dominant RCA with severe mid vessel stenosis Successful PTCA/DES x 1 mid RCA Elevated right heart pressures. Recommendations: ASA/Plavix  for one month then stop ASA. Restart Eliquis  tomorrow. Increase Lasix  to 80 mg po BID for one week then resume 60 mg po BID. Same day post PCI discharge.     Past medical hx Past Medical History:  Diagnosis Date   A-fib [facility])    Abnormal EKG 06/18/2015   Aortic atherosclerosis    AV malformation of GI tract     Bradycardia    Carotid artery disease    COPD (chronic obstructive pulmonary disease) (HCC)    COPD with exacerbation (HCC)    Diastolic dysfunction    Faintness 06/18/2015   GIB (gastrointestinal bleeding)    Hepatic steatosis    by imaging   Hypertension    LBBB (left bundle branch block) 06/18/2015   Moderate aortic stenosis    NSVT (nonsustained ventricular tachycardia) (HCC)    Obesity    OSA on CPAP    PVC's (premature ventricular contractions)    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 His smoking use included cigarettes. He started smoking about 67 years ago. He has a 120 pack-year smoking history. He has never used smokeless tobacco. He reports that he does not drink alcohol  and does not use drugs.  Tobacco Cessation: Counseling given: Not Answered   Past surgical hx, Family hx, Social hx all reviewed.  Current Outpatient Medications on File Prior to Visit  Medication Sig   acetaminophen  (TYLENOL ) 325 MG tablet Take 2 tablets (650 mg total) by mouth every 6 (six) hours as needed for mild pain (or Fever >/= 101).   albuterol  (PROVENTIL ) (2.5 MG/3ML) 0.083% nebulizer solution Take 3 mLs (2.5 mg total) by nebulization every 4 (four) hours as needed for wheezing or shortness of breath.   albuterol  (VENTOLIN  HFA) 108 (90 Base) MCG/ACT inhaler Inhale 2 puffs into the lungs every 6 (six) hours as needed for wheezing or shortness of breath.   aspirin  EC 81 MG tablet Take 1 tablet (81 mg total) by mouth daily. Swallow whole.   cetirizine (ZYRTEC) 10 MG tablet Take 10 mg by mouth every evening.   clopidogrel  (PLAVIX ) 75 MG tablet Take 1 tablet (75 mg total) by mouth daily with breakfast.   ELIQUIS  5 MG TABS tablet TAKE 1 TABLET(5 MG) BY MOUTH TWICE DAILY.   furosemide  (LASIX ) 40 MG tablet TAKE 1 AND  1/2 TABLETS(60 MG) BY MOUTH TWICE DAILY   latanoprost  (XALATAN ) 0.005 % ophthalmic solution Place 1 drop into both eyes at bedtime.   methimazole (TAPAZOLE) 5 MG tablet Take 5 mg by mouth See admin instructions. Mon-Fri   metoprolol  tartrate (LOPRESSOR ) 25 MG tablet TAKE 1/2 TABLET(12.5 MG) BY MOUTH TWICE DAILY   nitroGLYCERIN  (  NITROSTAT ) 0.4 MG SL tablet Place 1 tablet (0.4 mg total) under the tongue every 5 (five) minutes as needed.   Polyethyl Glycol-Propyl Glycol (SYSTANE OP) Place 1 drop into both eyes daily as needed (dry eyes).   rosuvastatin  (CRESTOR ) 20 MG tablet Take 20 mg by mouth daily.   tamsulosin  (FLOMAX ) 0.4 MG CAPS capsule Take 1 capsule by mouth every evening.   fluticasone -salmeterol (WIXELA INHUB) 250-50 MCG/ACT AEPB Inhale 1 puff into the lungs in the morning and at bedtime. (Patient not taking: Reported on 06/13/2024)   No current facility-administered medications on file prior to visit.     Allergies[2]  Review Of Systems:  Constitutional:   No  weight loss, night sweats,  Fevers, chills, fatigue, or  lassitude.  HEENT:   No headaches,  Difficulty swallowing,  Tooth/dental problems, or  Sore throat,                No sneezing, itching, ear ache, nasal congestion, post nasal drip,   CV:  No chest pain,  Orthopnea, PND, swelling in lower extremities, anasarca, dizziness, palpitations, syncope.   GI  No heartburn, indigestion, abdominal pain, nausea, vomiting, diarrhea, change in bowel habits, loss of appetite, bloody stools.   Resp: No shortness of breath with exertion or at rest.  No excess mucus, no productive cough,  No non-productive cough,  No coughing up of blood.  No change in color of mucus.  No wheezing.  No chest wall deformity  Skin: no rash or lesions.  GU: no dysuria, change in color of urine, no urgency or frequency.  No flank pain, no hematuria   MS:  No joint pain or swelling.  No decreased range of motion.  No back pain.  Psych:  No change in  mood or affect. No depression or anxiety.  No memory loss.   Vital Signs BP 135/75   Pulse (!) 46   Temp 97.7 F (36.5 C) (Oral)   Ht 5' 6 (1.676 m)   Wt 236 lb (107 kg)   SpO2 98%   BMI 38.09 kg/m    Physical Exam:  General- No distress,  A&Ox3 ENT: No sinus tenderness, TM clear, pale nasal mucosa, no oral exudate,no post nasal drip, no LAN Cardiac: S1, S2, regular rate and rhythm, no murmur Chest: No wheeze/ rales/ dullness; no accessory muscle use, no nasal flaring, no sternal retractions Abd.: Soft Non-tender Ext: No clubbing cyanosis, edema Neuro:  normal strength Skin: No rashes, warm and dry Psych: normal mood and behavior  Physical Exam GENERAL: No distress, alert and oriented times 3. EARS NOSE THROAT: No sinus tenderness, tympanic membranes clear, pale nasal mucosa, no oral exudate, no post nasal drip, no lymphadenopathy. CHEST: No wheeze, rales, dullness, no accessory muscle use, no nasal flaring, no sternal retractions. CARDIAC: S1, S2, regular rate and rhythm, no murmur. ABDOMINAL: Soft, non tender. ND, BS present, EXTREMITIES: No clubbing, cyanosis, edema. No obvious deformities NEUROLOGICAL: Normal strength. Alert and oriented x 3, MAE x 4 SKIN: No rashes, warm and dry. No obvious skin lesions PSYCHIATRIC: Normal mood and behavior.   Assessment/Plan  Assessment and Plan Assessment & Plan Suspicious enlarging pulmonary nodule Lingular nodule increased from 4.1 mm to 11.9 mm, rapid growth suggests malignancy.  PET scan uptake supports malignancy suspicion.  Biopsy not feasible in near future due to anticoagulation and new cardiac stent placement. - Referred to radiation oncology for SBRT evaluation. - Discussed liquid biopsy with Dr. Donzetta. - Instructed to monitor for hemoptysis or  unexplained weight loss. - Will reach out to cardiology to see if we can co-ordinate biopsy with TAVR as he will most likely need to come off antiplatelet therapy for that  procedure.  Indeterminate ground glass pulmonary nodule Ground glass nodule shows low PET uptake, nature indeterminate, possibly infection or structural changes. - Monitor closely for changes in size or characteristics.  COPD - Continue Wixella - Albuterol  as needed for shortness of breath or wheezing - Note your daily symptoms > remember red flags for COPD:  Increase in cough, increase in sputum production, increase in shortness of breath or activity intolerance. If you notice these symptoms, please call to be seen.     OSA on CPAP Wear CPAP  for at least 4 hours each night for greater than 70% of the time to avoid the machine being repossessed by insurance, and for benefit of therapy.    I spent 30 minutes dedicated to the care of this patient on the date of this encounter to include pre-visit review of records, face-to-face time with the patient discussing conditions above, post visit ordering of testing, clinical documentation with the electronic health record, making appropriate referrals as documented, and communicating necessary information to the patient's healthcare team.   Lauraine JULIANNA Lites, NP 06/13/2024  10:20 AM             [1]  Social History Tobacco Use   Smoking status: Former    Current packs/day: 0.00    Average packs/day: 2.0 packs/day for 60.0 years (120.0 ttl pk-yrs)    Types: Cigarettes    Start date: 06/13/1957    Quit date: 06/13/2017    Years since quitting: 7.0   Smokeless tobacco: Never  Vaping Use   Vaping status: Never Used  Substance Use Topics   Alcohol  use: No    Alcohol /week: 0.0 standard drinks of alcohol    Drug use: No  [2]  Allergies Allergen Reactions   Cardizem  [Diltiazem ] Shortness Of Breath   Pacerone  [Amiodarone ] Shortness Of Breath, Nausea Only and Other (See Comments)    Abdominal pain Dizziness   Tambocor  [Flecainide ] Shortness Of Breath   "

## 2024-06-17 ENCOUNTER — Telehealth: Payer: Self-pay | Admitting: Acute Care

## 2024-06-17 NOTE — Telephone Encounter (Signed)
 I have called the patient and his wife. I let them know I had heard from Dr. Barbette about his new cardiac stent , and that they would prefer not to stop plavix  and ASA as there is a risk of stent thrombosis. I have referred patient to radiation oncology for consideration of radiation therapy without tissue diagnosis. They understand they may refuse to treat. If this is the case, we will follow up with Dr. Barbette about our options for biopsy. Both patient and his wife verbalized understanding. I have asked her to call me if they do not hear from the radiation oncology to get a consult scheduled. They verbalized understanding.

## 2024-06-17 NOTE — Progress Notes (Unsigned)
 " Cardiology Office Note   Date:  06/18/2024  ID:  Derrick Alexander, Derrick Alexander 04/10/1946, MRN [id number] PCP: Clarice Nottingham, MD  [facility] Providers Cardiologist:  Vinie JAYSON Maxcy, MD Electrophysiologist:  Will Gladis Norton, MD  Structural Heart:  [name], MD  History of Present Illness Derrick Alexander is a 79 y.o. male with PMH of paroxsymal atrial fibrillation, HTN, hx of GI bleed, aortic stenosis who is followed by Dr. Maxcy as an outpatient. Most recently seen in the office 05/06/2024 for follow up regarding echo from 03/2024 that showed severe aortic stenosis. He was noted to be symptomatic with shortness of breath and worsening fatigue, therefore was set up for outpatient L/RHC.    Hospital course included, the below cardiac catheterization with DES/PCI x 1 to the proximal RCA.  Plan for ASA/Plavix /Eliquis  for at least 1 month and then stop ASA and continue Plavix  and Eliquis .  Patient was seen by cardiac rehab while in short stay.  No observed complications from the cath.  Was seen by Dr. Cash and determined stable for discharge.  Today, he presents with a known hx of severe aortic stenosis who presents for pre-procedural evaluation and lab work.  He has severe aortic stenosis confirmed by echocardiogram in November and presents for pre-procedural labs.  He recently had a cardiac catheterization with stent placement to the right coronary artery. His regimen now includes aspirin , Plavix , and Eliquis . He developed fluid retention, so furosemide  was increased from 60 mg to 80 mg daily, and his kidney function is being monitored.  On January 14th, the day of his catheterization, his potassium was 2.8. There is no documentation that he received IV potassium or was discharged with supplements.  He has atrial fibrillation but usually does not feel palpitations. He notices more breathlessness with walking. He has had no known AFib episodes since hospital discharge.  He has a  history of epistaxis from dry weather and a deviated septum, with no recent significant bleeding, but he is on multiple blood thinners so bleeding is being monitored.  His LDL last summer was 52 while on rosuvastatin  20 mg.  He has pulmonary nodules, one of which has enlarged and measured on PET, but no acute cardiopulmonary events were reported. He denies recent dizziness, lightheadedness, or syncope and notes increased breathlessness only with physical activity.   Reports no shortness of breath nor dyspnea on exertion. Reports no chest pain, pressure, or tightness. No edema, orthopnea, PND. Reports no palpitations.   Discussed the use of AI scribe software for clinical note transcription with the patient, who gave verbal consent to proceed.   ROS: Pertinent ROS in HPI  Studies Reviewed      Cardiac Catheterization 06/04/2024:     Prox RCA-1 lesion is 30% stenosed.   Prox RCA-2 lesion is 80% stenosed.   Prox RCA to Mid RCA lesion is 60% stenosed.   Mid LM lesion is 30% stenosed.   Dist Cx lesion is 50% stenosed.   Prox Cx lesion is 30% stenosed.   Prox LAD to Mid LAD lesion is 30% stenosed.   A drug-eluting stent was successfully placed using a STENT SYNERGY XD 3.50X24.   Post intervention, there is a 0% residual stenosis.   Post intervention, there is a 0% residual stenosis.   Mild non-obstructive disease in the distal left main, proximal LAD and mid Circumflex. Moderate non-obstructive disease in the distal OM branch Large dominant RCA with severe mid vessel stenosis Successful PTCA/DES x 1 mid RCA Elevated  right heart pressures.    Recommendations: ASA/Plavix  for one month then stop ASA. Restart Eliquis  tomorrow. Increase Lasix  to 80 mg po BID for one week then resume 60 mg po BID. Same day post PCI discharge.    Diagnostic Dominance: Right  Intervention    _____________   Risk Assessment/Calculations  CHA2DS2-VASc Score = 5  This indicates a 7.2% annual risk of  stroke. The patient's score is based upon: CHF History: 1 HTN History: 1 Diabetes History: 0 Stroke History: 0 Vascular Disease History: 1 Age Score: 2 Gender Score: 0       Physical Exam VS:  BP 116/78   Pulse 78   Ht 5' 6 (1.676 m)   Wt 240 lb (108.9 kg)   SpO2 96%   BMI 38.74 kg/m        Wt Readings from Last 3 Encounters:  06/18/24 240 lb (108.9 kg)  06/13/24 236 lb (107 kg)  06/04/24 248 lb (112.5 kg)    GEN: Well nourished, well developed in no acute distress NECK: No JVD; No carotid bruits CARDIAC: IRIR, no murmurs, rubs, gallops RESPIRATORY:  Clear to auscultation without rales, wheezing or rhonchi  ABDOMEN: Soft, non-tender, non-distended EXTREMITIES:  No edema; No deformity   ASSESSMENT AND PLAN  Severe aortic stenosis Confirmed by echocardiogram. Urgent intervention required. CT scan scheduled for TAVR planning. - Ordered CT scan on February 4th to determine valve size and access route for TAVR. - Scheduled follow-up with cardiothoracic surgeon on February 11th. -hopeful for TAVR by the end of Feb  Persistent atrial fibrillation Rate controlled, stable in the 60s. Potassium levels critical for rhythm stability. - Monitor potassium levels and maintain within target range to prevent arrhythmias.  Status post coronary artery stent placement Right coronary artery stent. Current antiplatelet therapy includes aspirin , Plavix , and Eliquis . Aspirin  to be discontinued after one month. - Continue aspirin , Plavix , and Eliquis  until February 14th, then discontinue aspirin .  Hypokalemia Potassium level of 2.8 on January 14th, likely due to increased diuretic use. Risk of arrhythmias if not corrected. - Ordered BMP to recheck potassium levels. - Will consider potassium supplementation if levels remain low.  Volume overload and hypokalemia  Recent increase in furosemide  due to fluid retention post-stent placement. Monitoring kidney function to adjust diuretic  dosage. - Ordered BMP to assess kidney function. - Continue furosemide  at current dose if kidney function is stable. - Will adjust furosemide  dose if kidney function declines.     Cardiac Rehabilitation Eligibility Assessment  The patient is ready to start cardiac rehabilitation from a cardiac standpoint.     Dispo: He can follow-up in 3 months with MD  Signed, Orren LOISE Fabry, PA-C   "

## 2024-06-18 ENCOUNTER — Ambulatory Visit: Attending: Physician Assistant | Admitting: Physician Assistant

## 2024-06-18 ENCOUNTER — Encounter: Payer: Self-pay | Admitting: Physician Assistant

## 2024-06-18 VITALS — BP 116/78 | HR 78 | Ht 66.0 in | Wt 240.0 lb

## 2024-06-18 DIAGNOSIS — I4819 Other persistent atrial fibrillation: Secondary | ICD-10-CM

## 2024-06-18 DIAGNOSIS — I447 Left bundle-branch block, unspecified: Secondary | ICD-10-CM | POA: Diagnosis not present

## 2024-06-18 DIAGNOSIS — R5383 Other fatigue: Secondary | ICD-10-CM | POA: Diagnosis not present

## 2024-06-18 DIAGNOSIS — Z955 Presence of coronary angioplasty implant and graft: Secondary | ICD-10-CM | POA: Diagnosis not present

## 2024-06-18 DIAGNOSIS — R0602 Shortness of breath: Secondary | ICD-10-CM

## 2024-06-18 DIAGNOSIS — I480 Paroxysmal atrial fibrillation: Secondary | ICD-10-CM

## 2024-06-18 DIAGNOSIS — I350 Nonrheumatic aortic (valve) stenosis: Secondary | ICD-10-CM | POA: Diagnosis not present

## 2024-06-18 DIAGNOSIS — I4729 Other ventricular tachycardia: Secondary | ICD-10-CM

## 2024-06-18 LAB — BASIC METABOLIC PANEL WITH GFR
BUN/Creatinine Ratio: 11 (ref 10–24)
BUN: 13 mg/dL (ref 8–27)
CO2: 24 mmol/L (ref 20–29)
Calcium: 9.5 mg/dL (ref 8.6–10.2)
Chloride: 100 mmol/L (ref 96–106)
Creatinine, Ser: 1.15 mg/dL (ref 0.76–1.27)
Glucose: 120 mg/dL — ABNORMAL HIGH (ref 70–99)
Potassium: 4 mmol/L (ref 3.5–5.2)
Sodium: 141 mmol/L (ref 134–144)
eGFR: 65 mL/min/{1.73_m2}

## 2024-06-18 NOTE — Patient Instructions (Signed)
 Medication Instructions:  Your physician recommends that you continue on your current medications as directed. Please refer to the Current Medication list given to you today. *If you need a refill on your cardiac medications before your next appointment, please call your pharmacy*  Lab Work: TODAY-BMET If you have labs (blood work) drawn today and your tests are completely normal, you will receive your results only by: MyChart Message (if you have MyChart) OR A paper copy in the mail If you have any lab test that is abnormal or we need to change your treatment, we will call you to review the results.  Testing/Procedures: NONE ORDERED  Follow-Up: At [facility], you and your health needs are our priority.  As part of our continuing mission to provide you with exceptional heart care, our providers are all part of one team.  This team includes your primary Cardiologist (physician) and Advanced Practice Providers or APPs (Physician Assistants and Nurse Practitioners) who all work together to provide you with the care you need, when you need it.  Your next appointment:   3 month(s)  Provider:   [name], MD   We recommend signing up for the patient portal called MyChart.  Sign up information is provided on this After Visit Summary.  MyChart is used to connect with patients for Virtual Visits (Telemedicine).  Patients are able to view lab/test results, encounter notes, upcoming appointments, etc.  Non-urgent messages can be sent to your provider as well.   To learn more about what you can do with MyChart, go to [email or web address].   Other Instructions

## 2024-06-19 ENCOUNTER — Ambulatory Visit: Payer: Self-pay | Admitting: Physician Assistant

## 2024-06-23 DIAGNOSIS — C3412 Malignant neoplasm of upper lobe, left bronchus or lung: Secondary | ICD-10-CM | POA: Insufficient documentation

## 2024-06-25 ENCOUNTER — Ambulatory Visit: Payer: Self-pay | Admitting: Cardiovascular Disease

## 2024-06-25 ENCOUNTER — Ambulatory Visit (HOSPITAL_COMMUNITY)

## 2024-06-25 DIAGNOSIS — I350 Nonrheumatic aortic (valve) stenosis: Secondary | ICD-10-CM

## 2024-06-25 MED ORDER — IOHEXOL 350 MG/ML SOLN
100.0000 mL | Freq: Once | INTRAVENOUS | Status: AC | PRN
  Administered 2024-06-25: 100 mL via INTRAVENOUS

## 2024-06-25 NOTE — Progress Notes (Signed)
 Procedure Type: Isolated AVR Perioperative Outcome Estimate % Operative Mortality 4.48% Morbidity & Mortality 20.8% Stroke 0.989% Renal Failure 6.09% Reoperation 4.05% Prolonged Ventilation 13.4% Deep Sternal Wound Infection 0.277% Long Hospital Stay (>14 days) 10.3% Short Hospital Stay (<6 days)* 20.8%

## 2024-06-26 ENCOUNTER — Encounter: Payer: Self-pay | Admitting: Radiation Oncology

## 2024-06-26 ENCOUNTER — Ambulatory Visit
Admission: RE | Admit: 2024-06-26 | Discharge: 2024-06-26 | Attending: Radiation Oncology | Admitting: Radiation Oncology

## 2024-06-26 ENCOUNTER — Ambulatory Visit: Admission: RE | Admit: 2024-06-26 | Discharge: 2024-06-26 | Attending: Radiation Oncology

## 2024-06-26 VITALS — BP 162/85 | HR 79 | Temp 97.7°F | Resp 24 | Ht 66.0 in | Wt 244.6 lb

## 2024-06-26 DIAGNOSIS — C3412 Malignant neoplasm of upper lobe, left bronchus or lung: Secondary | ICD-10-CM

## 2024-06-26 NOTE — Progress Notes (Signed)
 Location of tumor and Histology per Pathology Report:  He presents after a lung cancer screening in December 2025 revealed an enlarging lingular nodule.  Biopsy:   Past/Anticipated interventions by surgeon, if any:   Past/Anticipated interventions by medical oncology, if any: Chemotherapy     Pain issues, if any:  Denies pain  SAFETY ISSUES: Prior radiation? No Pacemaker/ICD? No Possible current pregnancy? Male Is the patient on methotrexate? No  Current Complaints / other details:     BP (!) 162/85 (BP Location: Left Arm, Patient Position: Sitting, Cuff Size: Large)   Pulse 79   Temp 97.7 F (36.5 C)   Resp (!) 24   Ht 5' 6 (1.676 m)   Wt 244 lb 9.6 oz (110.9 kg)   SpO2 99%   BMI 39.48 kg/m

## 2024-06-30 ENCOUNTER — Encounter: Admitting: Surgery

## 2024-07-02 ENCOUNTER — Encounter: Admitting: Surgery

## 2024-07-03 ENCOUNTER — Ambulatory Visit: Admitting: Radiation Oncology

## 2024-09-08 ENCOUNTER — Ambulatory Visit (HOSPITAL_BASED_OUTPATIENT_CLINIC_OR_DEPARTMENT_OTHER): Admitting: Pulmonary Disease
